# Patient Record
Sex: Female | Born: 1995 | Race: White | Hispanic: No | Marital: Married
Health system: Southern US, Community
[De-identification: ages and names within clinical notes are randomized; demographics above are authoritative.]

## PROBLEM LIST (undated history)

## (undated) ENCOUNTER — Inpatient Hospital Stay (HOSPITAL_COMMUNITY): Payer: Self-pay

## (undated) DIAGNOSIS — I493 Ventricular premature depolarization: Secondary | ICD-10-CM

## (undated) DIAGNOSIS — Z9682 Presence of neurostimulator: Secondary | ICD-10-CM

## (undated) DIAGNOSIS — Z227 Latent tuberculosis: Secondary | ICD-10-CM

## (undated) DIAGNOSIS — Z9689 Presence of other specified functional implants: Secondary | ICD-10-CM

## (undated) DIAGNOSIS — R569 Unspecified convulsions: Secondary | ICD-10-CM

## (undated) DIAGNOSIS — N83209 Unspecified ovarian cyst, unspecified side: Secondary | ICD-10-CM

## (undated) DIAGNOSIS — G40909 Epilepsy, unspecified, not intractable, without status epilepticus: Secondary | ICD-10-CM

## (undated) DIAGNOSIS — K219 Gastro-esophageal reflux disease without esophagitis: Secondary | ICD-10-CM

## (undated) DIAGNOSIS — R002 Palpitations: Secondary | ICD-10-CM

## (undated) DIAGNOSIS — I10 Essential (primary) hypertension: Secondary | ICD-10-CM

## (undated) DIAGNOSIS — Z87442 Personal history of urinary calculi: Secondary | ICD-10-CM

## (undated) DIAGNOSIS — N200 Calculus of kidney: Secondary | ICD-10-CM

## (undated) DIAGNOSIS — G43909 Migraine, unspecified, not intractable, without status migrainosus: Secondary | ICD-10-CM

## (undated) DIAGNOSIS — N809 Endometriosis, unspecified: Secondary | ICD-10-CM

## (undated) DIAGNOSIS — E041 Nontoxic single thyroid nodule: Secondary | ICD-10-CM

## (undated) DIAGNOSIS — B009 Herpesviral infection, unspecified: Secondary | ICD-10-CM

## (undated) DIAGNOSIS — F419 Anxiety disorder, unspecified: Secondary | ICD-10-CM

## (undated) DIAGNOSIS — F32A Depression, unspecified: Secondary | ICD-10-CM

## (undated) HISTORY — DX: Palpitations: R00.2

## (undated) HISTORY — DX: Migraine, unspecified, not intractable, without status migrainosus: G43.909

## (undated) HISTORY — PX: LAPAROSCOPY: SHX197

## (undated) HISTORY — DX: Anxiety disorder, unspecified: F41.9

## (undated) HISTORY — PX: TONSILLECTOMY: SUR1361

## (undated) HISTORY — PX: THYROIDECTOMY, PARTIAL: SHX18

## (undated) HISTORY — DX: Presence of neurostimulator: Z96.82

## (undated) HISTORY — PX: VAGUS NERVE STIMULATOR INSERTION: SHX348

---
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
## 2013-03-05 ENCOUNTER — Encounter (HOSPITAL_COMMUNITY): Payer: Self-pay | Admitting: Emergency Medicine

## 2013-03-05 ENCOUNTER — Emergency Department (HOSPITAL_COMMUNITY)
Admission: EM | Admit: 2013-03-05 | Discharge: 2013-03-06 | Disposition: A | Discharge status: Home / Self Care | Payer: Medicaid Other | Attending: Emergency Medicine | Admitting: Emergency Medicine

## 2013-03-05 DIAGNOSIS — R1031 Right lower quadrant pain: Secondary | ICD-10-CM | POA: Insufficient documentation

## 2013-03-05 DIAGNOSIS — O9989 Other specified diseases and conditions complicating pregnancy, childbirth and the puerperium: Secondary | ICD-10-CM | POA: Insufficient documentation

## 2013-03-05 DIAGNOSIS — O26899 Other specified pregnancy related conditions, unspecified trimester: Secondary | ICD-10-CM

## 2013-03-05 DIAGNOSIS — Z79899 Other long term (current) drug therapy: Secondary | ICD-10-CM | POA: Insufficient documentation

## 2013-03-05 LAB — CBC WITH DIFFERENTIAL/PLATELET
Basophils Absolute: 0 10*3/uL (ref 0.0–0.1)
Basophils Relative: 0 % (ref 0–1)
Eosinophils Absolute: 0.1 10*3/uL (ref 0.0–0.7)
Eosinophils Relative: 1 % (ref 0–5)
HCT: 39 % (ref 36.0–46.0)
Hemoglobin: 14 g/dL (ref 12.0–15.0)
Lymphocytes Relative: 26 % (ref 12–46)
Lymphs Abs: 2.4 10*3/uL (ref 0.7–4.0)
MCH: 34.1 pg — ABNORMAL HIGH (ref 26.0–34.0)
MCHC: 35.9 g/dL (ref 30.0–36.0)
MCV: 94.9 fL (ref 78.0–100.0)
Monocytes Absolute: 0.7 10*3/uL (ref 0.1–1.0)
Monocytes Relative: 8 % (ref 3–12)
Neutro Abs: 5.8 10*3/uL (ref 1.7–7.7)
Neutrophils Relative %: 64 % (ref 43–77)
Platelets: 251 10*3/uL (ref 150–400)
RBC: 4.11 MIL/uL (ref 3.87–5.11)
RDW: 12.4 % (ref 11.5–15.5)
WBC: 9 10*3/uL (ref 4.0–10.5)

## 2013-03-05 LAB — URINALYSIS, ROUTINE W REFLEX MICROSCOPIC
Bilirubin Urine: NEGATIVE
Glucose, UA: 500 mg/dL — AB
Hgb urine dipstick: NEGATIVE
Ketones, ur: NEGATIVE mg/dL
Nitrite: NEGATIVE
Protein, ur: NEGATIVE mg/dL
Specific Gravity, Urine: 1.023 (ref 1.005–1.030)
Urobilinogen, UA: 0.2 mg/dL (ref 0.0–1.0)
pH: 5.5 (ref 5.0–8.0)

## 2013-03-05 LAB — URINE MICROSCOPIC-ADD ON

## 2013-03-05 LAB — COMPREHENSIVE METABOLIC PANEL
ALT: 21 U/L (ref 0–35)
AST: 20 U/L (ref 0–37)
Albumin: 4.4 g/dL (ref 3.5–5.2)
Alkaline Phosphatase: 73 U/L (ref 39–117)
BUN: 15 mg/dL (ref 6–23)
CO2: 27 mEq/L (ref 19–32)
Calcium: 9.5 mg/dL (ref 8.4–10.5)
Chloride: 105 mEq/L (ref 96–112)
Creatinine, Ser: 0.59 mg/dL (ref 0.50–1.10)
GFR calc Af Amer: >90 mL/min (ref >90)
GFR calc non Af Amer: >90 mL/min (ref >90)
Glucose, Bld: 82 mg/dL (ref 70–99)
Potassium: 3.6 mEq/L (ref 3.5–5.1)
Sodium: 142 mEq/L (ref 135–145)
Total Bilirubin: 0.3 mg/dL (ref 0.3–1.2)
Total Protein: 7 g/dL (ref 6.0–8.3)

## 2013-03-05 LAB — POCT PREGNANCY, URINE: Preg Test, Ur: POSITIVE — AB

## 2013-03-05 NOTE — ED Notes (Signed)
Patient is sitting in the waiting room eating fast food without any difficulty.

## 2013-03-05 NOTE — ED Notes (Signed)
Pt. reports right abdominal pain onset yesterday , denies nausea or vomitting /diarrhea.  No vaginal bleeding . Pt. stated she is 2 months pregnant ( G1P0 ).

## 2013-03-06 ENCOUNTER — Emergency Department (HOSPITAL_COMMUNITY): Payer: Medicaid Other

## 2013-03-06 LAB — WET PREP, GENITAL
Trich, Wet Prep: NONE SEEN
Yeast Wet Prep HPF POC: NONE SEEN

## 2013-03-06 LAB — HCG, QUANTITATIVE, PREGNANCY: hCG, Beta Chain, Quant, S: 4736 m[IU]/mL — ABNORMAL HIGH (ref <5)

## 2013-03-06 LAB — ABO/RH: ABO/RH(D): O POS

## 2013-03-06 NOTE — ED Notes (Signed)
Pt states that she is [redacted] weeks pregnant and has been having really bad pains on her side which started yesterday.

## 2013-03-06 NOTE — ED Provider Notes (Signed)
CSN: [id number]     Arrival date & time 03/05/13  1954 History   First MD Initiated Contact with Patient 03/06/13 0004     Chief Complaint  Patient presents with  . Abdominal Pain   (Consider location/radiation/quality/duration/timing/severity/associated sxs/prior Treatment) HPI This is a 17 year old female G1 P0 who is about [redacted] weeks pregnant. She's had a two-day history of right lower quadrant pain. She describes the pain as intermittent. It is sharp and crampy components. There does not seem to be any specific trigger for this pain. It is somewhat worse with movement or palpation. It is not affected by eating. It is mild at the present moment but has been moderate to severe at times. There's been no associated vaginal bleeding but she has had a vaginal discharge that has not acutely changed. She denies fever, chills, vomiting, diarrhea or urinary symptoms. She is in a [employer] [place] due to a recent ankle fracture.  History reviewed. No pertinent past medical history. History reviewed. No pertinent past surgical history. No family history on file. History  Substance Use Topics  . Smoking status: Never Smoker   . Smokeless tobacco: Not on file  . Alcohol Use: Yes   OB History   Grav Para Term Preterm Abortions TAB SAB Ect Mult Living   1              Review of Systems  All other systems reviewed and are negative.    Allergies  Review of patient's allergies indicates no known allergies.  Home Medications   Current Outpatient Rx  Name  Route  Sig  Dispense  Refill  . acetaminophen (TYLENOL) 325 MG tablet   Oral   Take 650 mg by mouth every 6 (six) hours as needed for pain.         . Prenatal Multivit-Min-Fe-FA (PRENATAL FORTE PO)   Oral   Take 1 tablet by mouth daily.         . valACYclovir (VALTREX) 500 MG tablet   Oral   Take 500 mg by mouth daily.          BP 106/52  Pulse 86  Temp(Src) 98.2 F (36.8 C) (Oral)  Resp 18  SpO2 98%  LMP 01/30/2013  Physical  Exam General: Well-developed, well-nourished female in no acute distress; appearance consistent with age of record HENT: normocephalic; atraumatic Eyes: pupils equal, round and reactive to light; extraocular muscles intact Neck: supple Heart: regular rate and rhythm; no murmurs, rubs or gallops Lungs: clear to auscultation bilaterally Abdomen: soft; nondistended; right suprapubic tenderness; no masses or hepatosplenomegaly; bowel sounds present GU: Normal external genitalia; no vaginal bleeding; white vaginal discharge; no cervical motion tenderness; cervical os closed; right adnexal tenderness Extremities: No deformity; full range of motion; pulses normal Neurologic: Awake, alert and oriented; motor function intact in all extremities and symmetric; no facial droop Skin: Warm and dry Psychiatric: Normal mood and affect    ED Course  Procedures (including critical care time)  MDM   Nursing notes and vitals signs, including pulse oximetry, reviewed.  Summary of this visit's results, reviewed by myself:  Labs:  Results for orders placed during the hospital encounter of 03/05/13 (from the past 24 hour(s))  CBC WITH DIFFERENTIAL     Status: Abnormal   Collection Time    03/05/13  8:02 PM      Result Value Range   WBC 9.0  4.0 - 10.5 K/uL   RBC 4.11  3.87 - 5.11 MIL/uL   Hemoglobin  14.0  12.0 - 15.0 g/dL   HCT 40.9  81.1 - 91.4 %   MCV 94.9  78.0 - 100.0 fL   MCH 34.1 (*) 26.0 - 34.0 pg   MCHC 35.9  30.0 - 36.0 g/dL   RDW 78.2  95.6 - 21.3 %   Platelets 251  150 - 400 K/uL   Neutrophils Relative % 64  43 - 77 %   Neutro Abs 5.8  1.7 - 7.7 K/uL   Lymphocytes Relative 26  12 - 46 %   Lymphs Abs 2.4  0.7 - 4.0 K/uL   Monocytes Relative 8  3 - 12 %   Monocytes Absolute 0.7  0.1 - 1.0 K/uL   Eosinophils Relative 1  0 - 5 %   Eosinophils Absolute 0.1  0.0 - 0.7 K/uL   Basophils Relative 0  0 - 1 %   Basophils Absolute 0.0  0.0 - 0.1 K/uL  COMPREHENSIVE METABOLIC PANEL      Status: None   Collection Time    03/05/13  8:02 PM      Result Value Range   Sodium 142  135 - 145 mEq/L   Potassium 3.6  3.5 - 5.1 mEq/L   Chloride 105  96 - 112 mEq/L   CO2 27  19 - 32 mEq/L   Glucose, Bld 82  70 - 99 mg/dL   BUN 15  6 - 23 mg/dL   Creatinine, Ser 0.86  0.50 - 1.10 mg/dL   Calcium 9.5  8.4 - 57.8 mg/dL   Total Protein 7.0  6.0 - 8.3 g/dL   Albumin 4.4  3.5 - 5.2 g/dL   AST 20  0 - 37 U/L   ALT 21  0 - 35 U/L   Alkaline Phosphatase 73  39 - 117 U/L   Total Bilirubin 0.3  0.3 - 1.2 mg/dL   GFR calc non Af Amer >90  >90 mL/min   GFR calc Af Amer >90  >90 mL/min  URINALYSIS, ROUTINE W REFLEX MICROSCOPIC     Status: Abnormal   Collection Time    03/05/13  8:14 PM      Result Value Range   Color, Urine YELLOW  YELLOW   APPearance CLOUDY (*) CLEAR   Specific Gravity, Urine 1.023  1.005 - 1.030   pH 5.5  5.0 - 8.0   Glucose, UA 500 (*) NEGATIVE mg/dL   Hgb urine dipstick NEGATIVE  NEGATIVE   Bilirubin Urine NEGATIVE  NEGATIVE   Ketones, ur NEGATIVE  NEGATIVE mg/dL   Protein, ur NEGATIVE  NEGATIVE mg/dL   Urobilinogen, UA 0.2  0.0 - 1.0 mg/dL   Nitrite NEGATIVE  NEGATIVE   Leukocytes, UA TRACE (*) NEGATIVE  URINE MICROSCOPIC-ADD ON     Status: Abnormal   Collection Time    03/05/13  8:14 PM      Result Value Range   Squamous Epithelial / LPF FEW (*) RARE   WBC, UA 0-2  <3 WBC/hpf   Bacteria, UA RARE  RARE   Urine-Other MUCOUS PRESENT    POCT PREGNANCY, URINE     Status: Abnormal   Collection Time    03/05/13  8:21 PM      Result Value Range   Preg Test, Ur POSITIVE (*) NEGATIVE  HCG, QUANTITATIVE, PREGNANCY     Status: Abnormal   Collection Time    03/06/13 12:15 AM      Result Value Range   hCG, Beta Chain, Quant, S 4736 (*) <  5 mIU/mL  ABO/RH     Status: None   Collection Time    2013/03/13 12:15 AM      Result Value Range   ABO/RH(D) O POS     No rh immune globuloin NOT A RH IMMUNE GLOBULIN CANDIDATE, PT RH POSITIVE    WET PREP, GENITAL      Status: Abnormal   Collection Time    03/13/2013 12:42 AM      Result Value Range   Yeast Wet Prep HPF POC NONE SEEN  NONE SEEN   Trich, Wet Prep NONE SEEN  NONE SEEN   Clue Cells Wet Prep HPF POC MODERATE (*) NONE SEEN   WBC, Wet Prep HPF POC FEW (*) NONE SEEN    Imaging Studies: US Ob Comp Less 14 Wks  March 13, 2013   CLINICAL DATA:  Pelvic pain.  EXAM: OBSTETRIC <14 WK [place] AND TRANSVAGINAL OB US  TECHNIQUE: Both transabdominal and transvaginal ultrasound examinations were performed for complete evaluation of the gestation as well as the maternal uterus, adnexal regions, and pelvic cul-de-sac. Transvaginal technique was performed to assess early pregnancy.  COMPARISON:  None.  FINDINGS: Intrauterine gestational sac: Visualized/normal in shape.  Yolk sac:  Present  Embryo:  Not visualized  MSD:  7.2  mm   5 w   3  d  [place] EDC:   11/03/2013  Maternal uterus/adnexae: No subchorionic hemorrhage. Ovaries symmetric in size and echotexture. No adnexal masses. No free fluid.  IMPRESSION: Early intrauterine gestational sac, 5 weeks 3 days. No fetal pole currently. No acute maternal findings.   Electronically Signed   By: Charlett Nose M.D.   On: 13-Mar-2013 02:53   US Ob Transvaginal  03-13-2013   CLINICAL DATA:  Pelvic pain.  EXAM: OBSTETRIC <14 WK [place] AND TRANSVAGINAL OB US  TECHNIQUE: Both transabdominal and transvaginal ultrasound examinations were performed for complete evaluation of the gestation as well as the maternal uterus, adnexal regions, and pelvic cul-de-sac. Transvaginal technique was performed to assess early pregnancy.  COMPARISON:  None.  FINDINGS: Intrauterine gestational sac: Visualized/normal in shape.  Yolk sac:  Present  Embryo:  Not visualized  MSD:  7.2  mm   5 w   3  d  [place] EDC:   11/03/2013  Maternal uterus/adnexae: No subchorionic hemorrhage. Ovaries symmetric in size and echotexture. No adnexal masses. No free fluid.  IMPRESSION: Early intrauterine gestational sac, 5 weeks 3 days. No fetal  pole currently. No acute maternal findings.   Electronically Signed   By: Charlett Nose M.D.   On: March 13, 2013 02:53   3:13 AM Patient states pain is improved significantly. The analgesics were given in the ED. Her abdomen is soft without tenderness at this time. She was advised of lab and ultrasound findings. She was also advised that we cannot rule out early appendicitis and she should return should her pain worsen.  Hanley Seamen, MD 13-Mar-2013 [phone]

## 2013-03-07 LAB — GC/CHLAMYDIA PROBE AMP
CT Probe RNA: NEGATIVE
GC Probe RNA: NEGATIVE

## 2013-07-16 ENCOUNTER — Emergency Department (HOSPITAL_COMMUNITY)
Admission: EM | Admit: 2013-07-16 | Discharge: 2013-07-17 | Disposition: A | Discharge status: Home / Self Care | Payer: Medicaid Other | Attending: Emergency Medicine | Admitting: Emergency Medicine

## 2013-07-16 ENCOUNTER — Encounter (HOSPITAL_COMMUNITY): Payer: Self-pay | Admitting: Emergency Medicine

## 2013-07-16 DIAGNOSIS — O99891 Other specified diseases and conditions complicating pregnancy: Secondary | ICD-10-CM

## 2013-07-16 DIAGNOSIS — Z79899 Other long term (current) drug therapy: Secondary | ICD-10-CM | POA: Insufficient documentation

## 2013-07-16 DIAGNOSIS — M545 Low back pain, unspecified: Secondary | ICD-10-CM | POA: Insufficient documentation

## 2013-07-16 DIAGNOSIS — O26899 Other specified pregnancy related conditions, unspecified trimester: Secondary | ICD-10-CM

## 2013-07-16 DIAGNOSIS — M549 Dorsalgia, unspecified: Secondary | ICD-10-CM

## 2013-07-16 DIAGNOSIS — Z8679 Personal history of other diseases of the circulatory system: Secondary | ICD-10-CM | POA: Insufficient documentation

## 2013-07-16 DIAGNOSIS — O9989 Other specified diseases and conditions complicating pregnancy, childbirth and the puerperium: Secondary | ICD-10-CM | POA: Insufficient documentation

## 2013-07-16 DIAGNOSIS — R109 Unspecified abdominal pain: Secondary | ICD-10-CM | POA: Insufficient documentation

## 2013-07-16 HISTORY — DX: Ventricular premature depolarization: I49.3

## 2013-07-16 NOTE — ED Notes (Signed)
Pt c/o abd tightness with low back pain today. Pt is 24 weeks preg.

## 2013-07-17 ENCOUNTER — Encounter (HOSPITAL_COMMUNITY): Payer: Self-pay | Admitting: *Deleted

## 2013-07-17 MED ORDER — ACETAMINOPHEN 325 MG PO TABS
650.0000 mg | ORAL_TABLET | Freq: Once | ORAL | Status: AC
  Administered 2013-07-17: 650 mg via ORAL
  Filled 2013-07-17: qty 2

## 2013-07-17 NOTE — ED Provider Notes (Signed)
Medical screening examination/treatment/procedure(s) were performed by non-physician practitioner and as supervising physician I was immediately available for consultation/collaboration.   , MD 07/17/13 0632 

## 2013-07-17 NOTE — Progress Notes (Signed)
Called to see pt with complaints of back pain and tightening throughout the day. Pt is seen in [facility] with regular visits, has a regular appointment today at [facility]

## 2013-07-17 NOTE — ED Provider Notes (Signed)
CSN: [id number]     Arrival date & time 07/16/13  2238 History   First MD Initiated Contact with Patient 07/17/13 0002     Chief Complaint  Patient presents with  . Abdominal Cramping     (Consider location/radiation/quality/duration/timing/severity/associated sxs/prior Treatment) HPI Comments: Patient is a 18 year old female who is [redacted] weeks pregnant who presents to the emergency department complaining of abdominal tightness and low back pain x1 day. Patient states today while she was walking she developed a sharp low back pain radiating down both of her legs. Pain relieved by rest, worse with ambulation. Denies numbness or tingling. States earlier today she had tight abdominal cramping for a few minutes which subsided on its own. She has not had this symptom return, however states her abdomen feels "tight". She has an appointment with her OB GYN tomorrow for a general followup. Last had an ultrasound on January 15 which was normal. Denies any complications with this pregnancy. This is her first pregnancy. Denies injury, trauma, vaginal bleeding, chest pain or shortness of breath, nausea or vomiting.  Patient is a 18 y.o. female presenting with cramps. The history is provided by the patient.  Abdominal Cramping Associated symptoms include abdominal pain.    Past Medical History  Diagnosis Date  . PVC (premature ventricular contraction)    History reviewed. No pertinent past surgical history. No family history on file. History  Substance Use Topics  . Smoking status: Never Smoker   . Smokeless tobacco: Not on file  . Alcohol Use: No   OB History   Grav Para Term Preterm Abortions TAB SAB Ect Mult Living   1              Review of Systems  Gastrointestinal: Positive for abdominal pain.  Musculoskeletal: Positive for back pain.  All other systems reviewed and are negative.      Allergies  Review of patient's allergies indicates no known allergies.  Home Medications    Current Outpatient Rx  Name  Route  Sig  Dispense  Refill  . acetaminophen (TYLENOL) 325 MG tablet   Oral   Take 650 mg by mouth every 6 (six) hours as needed for pain.         . Prenatal Multivit-Min-Fe-FA (PRENATAL FORTE PO)   Oral   Take 1 tablet by mouth daily.         . valACYclovir (VALTREX) 500 MG tablet   Oral   Take 500 mg by mouth daily.          BP 142/77  Pulse 88  Temp(Src) 98.4 F (36.9 C) (Oral)  Resp 18  Wt 140 lb (63.504 kg)  SpO2 100%  LMP 01/30/2013 Physical Exam  Nursing note and vitals reviewed. Constitutional: She is oriented to person, place, and time. She appears well-developed and well-nourished. No distress.  HENT:  Head: Normocephalic and atraumatic.  Mouth/Throat: Oropharynx is clear and moist.  Eyes: Conjunctivae are normal.  Neck: Normal range of motion. Neck supple.  Cardiovascular: Normal rate, regular rhythm, normal heart sounds and intact distal pulses.   Pulmonary/Chest: Effort normal and breath sounds normal.  Abdominal: Soft. Bowel sounds are normal. There is no tenderness.  Gravid abdomen.  Musculoskeletal: Normal range of motion. She exhibits no edema.  Neurological: She is alert and oriented to person, place, and time. She has normal strength. No sensory deficit.  Skin: Skin is warm and dry. She is not diaphoretic.  Psychiatric: She has a normal mood and affect. Her behavior  is normal.    ED Course  Procedures (including critical care time) Labs Review Labs Reviewed - No data to display Imaging Review No results found.  EKG Interpretation   None       MDM   Final diagnoses:  Abdominal pain in pregnancy  Back pain in pregnancy    Patient presenting with abdominal pain and back pain in pregnancy. No trauma or injury. No associated vaginal bleeding. Patient was on monitor prior to my evaluation. Patient is not having any contractions. Fetal heart tones normal. Abdomen is nontender. No red flags concerning  patient's back pain. No s/s of central cord compression or cauda equina. Lower extremities are neurovascularly intact and patient is ambulating slowly but normal. Patient has a followup with OB/GYN tomorrow for her general scheduled visit. She is stable for discharge. Close return precautions discussed, patient aware to a women[facility] hospital if any worsening symptoms or vaginal bleeding. Return precautions given. Patient states understanding of treatment care plan and is agreeable.      Trevor MaceRobyn M Albert, PA-C 07/17/13 [phone]

## 2013-07-17 NOTE — ED Notes (Signed)
OB rapid response nurse at bedside. 

## 2013-07-17 NOTE — Discharge Instructions (Signed)
Take tylenol every 6 hours as needed for pain. Follow up with your OB/GYN as scheduled. Return to the emergency department [facility]) with worsening pain, vaginal bleeding or any trauma.  Abdominal Pain During Pregnancy Abdominal pain is common in pregnancy. Most of the time, it does not cause harm. There are many causes of abdominal pain. Some causes are more serious than others. Some of the causes of abdominal pain in pregnancy are easily diagnosed. Occasionally, the diagnosis takes time to understand. Other times, the cause is not determined. Abdominal pain can be a sign that something is very wrong with the pregnancy, or the pain may have nothing to do with the pregnancy at all. For this reason, always tell your health care provider if you have any abdominal discomfort. HOME CARE INSTRUCTIONS  Monitor your abdominal pain for any changes. The following actions may help to alleviate any discomfort you are experiencing:  Do not have sexual intercourse or put anything in your vagina until your symptoms go away completely.  Get plenty of rest until your pain improves.  Drink clear fluids if you feel nauseous. Avoid solid food as long as you are uncomfortable or nauseous.  Only take over-the-counter or prescription medicine as directed by your health care provider.  Keep all follow-up appointments with your health care provider. SEEK IMMEDIATE MEDICAL CARE IF:  You are bleeding, leaking fluid, or passing tissue from the vagina.  You have increasing pain or cramping.  You have persistent vomiting.  You have painful or bloody urination.  You have a fever.  You notice a decrease in your baby's movements.  You have extreme weakness or feel faint.  You have shortness of breath, with or without abdominal pain.  You develop a severe headache with abdominal pain.  You have abnormal vaginal discharge with abdominal pain.  You have persistent diarrhea.  You have abdominal pain  that continues even after rest, or gets worse. MAKE SURE YOU:   Understand these instructions.  Will watch your condition.  Will get help right away if you are not doing well or get worse. Document Released: 05/22/2005 Document Revised: 03/12/2013 Document Reviewed: 12/19/2012 [facility] Patient Information 2014 [place], [place].  Back Pain in Pregnancy Back pain during pregnancy is common. It happens in about half of all pregnancies. It is important for you and your baby that you remain active during your pregnancy.If you feel that back pain is not allowing you to remain active or sleep well, it is time to see your caregiver. Back pain may be caused by several factors related to changes during your pregnancy.Fortunately, unless you had trouble with your back before your pregnancy, the pain is likely to get better after you deliver. Low back pain usually occurs between the fifth and seventh months of pregnancy. It can, however, happen in the first couple months. Factors that increase the risk of back problems include:   Previous back problems.  Injury to your back.  Having twins or multiple births.  A chronic cough.  Stress.  Job-related repetitive motions.  Muscle or spinal disease in the back.  Family history of back problems, ruptured (herniated) discs, or osteoporosis.  Depression, anxiety, and panic attacks. CAUSES   When you are pregnant, your body produces a hormone called relaxin. This hormonemakes the ligaments connecting the low back and pubic bones more flexible. This flexibility allows the baby to be delivered more easily. When your ligaments are loose, your muscles need to work harder to support your back. Soreness  in your back can come from tired muscles. Soreness can also come from back tissues that are irritated since they are receiving less support.  As the baby grows, it puts pressure on the nerves and blood vessels in your pelvis. This can cause back pain.  As  the baby grows and gets heavier during pregnancy, the uterus pushes the stomach muscles forward and changes your center of gravity. This makes your back muscles work harder to maintain good posture. SYMPTOMS  Lumbar pain during pregnancy Lumbar pain during pregnancy usually occurs at or above the waist in the center of the back. There may be pain and numbness that radiates into your leg or foot. This is similar to low back pain experienced by non-pregnant women. It usually increases with sitting for long periods of time, standing, or repetitive lifting. Tenderness may also be present in the muscles along your upper back. Posterior pelvic pain during pregnancy Pain in the back of the pelvis is more common than lumbar pain in pregnancy. It is a deep pain felt in your side at the waistline, or across the tailbone (sacrum), or in both places. You may have pain on one or both sides. This pain can also go into the buttocks and backs of the upper thighs. Pubic and groin pain may also be present. The pain does not quickly resolve with rest, and morning stiffness may also be present. Pelvic pain during pregnancy can be brought on by most activities. A high level of fitness before and during pregnancy may or may not prevent this problem. Labor pain is usually 1 to 2 minutes apart, lasts for about 1 minute, and involves a bearing down feeling or pressure in your pelvis. However, if you are at term with the pregnancy, constant low back pain can be the beginning of early labor, and you should be aware of this. DIAGNOSIS  X-rays of the back should not be done during the first 12 to 14 weeks of the pregnancy and only when absolutely necessary during the rest of the pregnancy. MRIs do not give off radiation and are safe during pregnancy. MRIs also should only be done when absolutely necessary. HOME CARE INSTRUCTIONS  Exercise as directed by your caregiver. Exercise is the most effective way to prevent or manage back  pain. If you have a back problem, it is especially important to avoid sports that require sudden body movements. Swimming and walking are great activities.  Do not stand in one place for long periods of time.  Do not wear high heels.  Sit in chairs with good posture. Use a pillow on your lower back if necessary. Make sure your head rests over your shoulders and is not hanging forward.  Try sleeping on your side, preferably the left side, with a pillow or two between your legs. If you are sore after a night's rest, your bedmay betoo soft.Try placing a board between your mattress and box spring.  Listen to your body when lifting.If you are experiencing pain, ask for help or try bending yourknees more so you can use your leg muscles rather than your back muscles. Squat down when picking up something from the floor. Do not bend over.  Eat a healthy diet. Try to gain weight within your caregiver's recommendations.  Use heat or cold packs 3 to 4 times a day for 15 minutes to help with the pain.  Only take over-the-counter or prescription medicines for pain, discomfort, or fever as directed by your caregiver. Sudden (acute)  back pain  Use bed rest for only the most extreme, acute episodes of back pain. Prolonged bed rest over 48 hours will aggravate your condition.  Ice is very effective for acute conditions.  Put ice in a plastic bag.  Place a towel between your skin and the bag.  Leave the ice on for 10 to 20 minutes every 2 hours, or as needed.  Using heat packs for 30 minutes prior to activities is also helpful. Continued back pain See your caregiver if you have continued problems. Your caregiver can help or refer you for appropriate physical therapy. With conditioning, most back problems can be avoided. Sometimes, a more serious issue may be the cause of back pain. You should be seen right away if new problems seem to be developing. Your caregiver may recommend:  A maternity  girdle.  An elastic sling.  A back brace.  A massage therapist or acupuncture. SEEK MEDICAL CARE IF:   You are not able to do most of your daily activities, even when taking the pain medicine you were given.  You need a referral to a physical therapist or chiropractor.  You want to try acupuncture. SEEK IMMEDIATE MEDICAL CARE IF:  You develop numbness, tingling, weakness, or problems with the use of your arms or legs.  You develop severe back pain that is no longer relieved with medicines.  You have a sudden change in bowel or bladder control.  You have increasing pain in other areas of the body.  You develop shortness of breath, dizziness, or fainting.  You develop nausea, vomiting, or sweating.  You have back pain which is similar to labor pains.  You have back pain along with your water breaking or vaginal bleeding.  You have back pain or numbness that travels down your leg.  Your back pain developed after you fell.  You develop pain on one side of your back. You may have a kidney stone.  You see blood in your urine. You may have a bladder infection or kidney stone.  You have back pain with blisters. You may have shingles. Back pain is fairly common during pregnancy but should not be accepted as just part of the process. Back pain should always be treated as soon as possible. This will make your pregnancy as pleasant as possible. Document Released: 08/30/2005 Document Revised: 08/14/2011 Document Reviewed: 10/11/2010 [facility] Patient Information 2014 [place], [place].

## 2013-10-17 DIAGNOSIS — O1490 Unspecified pre-eclampsia, unspecified trimester: Secondary | ICD-10-CM

## 2014-04-06 ENCOUNTER — Encounter (HOSPITAL_COMMUNITY): Payer: Self-pay | Admitting: *Deleted

## 2014-05-22 ENCOUNTER — Encounter (HOSPITAL_COMMUNITY): Payer: Self-pay | Admitting: *Deleted

## 2014-12-11 DIAGNOSIS — G43009 Migraine without aura, not intractable, without status migrainosus: Secondary | ICD-10-CM | POA: Insufficient documentation

## 2014-12-11 DIAGNOSIS — G43709 Chronic migraine without aura, not intractable, without status migrainosus: Secondary | ICD-10-CM | POA: Insufficient documentation

## 2014-12-11 HISTORY — DX: Migraine without aura, not intractable, without status migrainosus: G43.009

## 2016-04-25 DIAGNOSIS — R002 Palpitations: Secondary | ICD-10-CM | POA: Insufficient documentation

## 2016-04-25 DIAGNOSIS — R0789 Other chest pain: Secondary | ICD-10-CM

## 2016-04-25 DIAGNOSIS — R0602 Shortness of breath: Secondary | ICD-10-CM | POA: Insufficient documentation

## 2016-04-25 HISTORY — DX: Other chest pain: R07.89

## 2016-04-25 HISTORY — DX: Shortness of breath: R06.02

## 2017-03-14 DIAGNOSIS — N809 Endometriosis, unspecified: Secondary | ICD-10-CM | POA: Insufficient documentation

## 2017-07-11 DIAGNOSIS — F4310 Post-traumatic stress disorder, unspecified: Secondary | ICD-10-CM | POA: Diagnosis not present

## 2017-07-11 DIAGNOSIS — Z7689 Persons encountering health services in other specified circumstances: Secondary | ICD-10-CM | POA: Diagnosis not present

## 2017-07-11 DIAGNOSIS — N3001 Acute cystitis with hematuria: Secondary | ICD-10-CM | POA: Diagnosis not present

## 2017-07-11 DIAGNOSIS — G43909 Migraine, unspecified, not intractable, without status migrainosus: Secondary | ICD-10-CM | POA: Diagnosis not present

## 2017-07-31 DIAGNOSIS — R6889 Other general symptoms and signs: Secondary | ICD-10-CM | POA: Diagnosis not present

## 2017-07-31 DIAGNOSIS — Z6826 Body mass index (BMI) 26.0-26.9, adult: Secondary | ICD-10-CM | POA: Diagnosis not present

## 2017-07-31 DIAGNOSIS — J019 Acute sinusitis, unspecified: Secondary | ICD-10-CM | POA: Diagnosis not present

## 2017-08-07 DIAGNOSIS — Z309 Encounter for contraceptive management, unspecified: Secondary | ICD-10-CM | POA: Diagnosis not present

## 2017-08-07 DIAGNOSIS — Z01419 Encounter for gynecological examination (general) (routine) without abnormal findings: Secondary | ICD-10-CM | POA: Diagnosis not present

## 2017-08-07 DIAGNOSIS — Z6826 Body mass index (BMI) 26.0-26.9, adult: Secondary | ICD-10-CM | POA: Diagnosis not present

## 2017-08-07 DIAGNOSIS — Z13220 Encounter for screening for lipoid disorders: Secondary | ICD-10-CM | POA: Diagnosis not present

## 2017-08-07 DIAGNOSIS — F4310 Post-traumatic stress disorder, unspecified: Secondary | ICD-10-CM | POA: Diagnosis not present

## 2017-08-08 DIAGNOSIS — Z202 Contact with and (suspected) exposure to infections with a predominantly sexual mode of transmission: Secondary | ICD-10-CM | POA: Diagnosis not present

## 2017-11-15 DIAGNOSIS — N809 Endometriosis, unspecified: Secondary | ICD-10-CM | POA: Diagnosis not present

## 2017-11-15 DIAGNOSIS — R102 Pelvic and perineal pain: Secondary | ICD-10-CM | POA: Diagnosis not present

## 2018-01-01 DIAGNOSIS — N809 Endometriosis, unspecified: Secondary | ICD-10-CM | POA: Diagnosis not present

## 2018-01-01 DIAGNOSIS — N941 Unspecified dyspareunia: Secondary | ICD-10-CM | POA: Diagnosis not present

## 2018-01-01 DIAGNOSIS — R102 Pelvic and perineal pain: Secondary | ICD-10-CM | POA: Diagnosis not present

## 2018-01-15 DIAGNOSIS — N803 Endometriosis of pelvic peritoneum: Secondary | ICD-10-CM | POA: Diagnosis not present

## 2018-01-15 DIAGNOSIS — N941 Unspecified dyspareunia: Secondary | ICD-10-CM | POA: Diagnosis not present

## 2018-01-15 DIAGNOSIS — R102 Pelvic and perineal pain: Secondary | ICD-10-CM | POA: Diagnosis not present

## 2018-01-30 DIAGNOSIS — F3181 Bipolar II disorder: Secondary | ICD-10-CM | POA: Diagnosis not present

## 2018-01-30 DIAGNOSIS — Z79891 Long term (current) use of opiate analgesic: Secondary | ICD-10-CM | POA: Diagnosis not present

## 2018-03-11 DIAGNOSIS — Z363 Encounter for antenatal screening for malformations: Secondary | ICD-10-CM | POA: Diagnosis not present

## 2018-03-11 DIAGNOSIS — Z01419 Encounter for gynecological examination (general) (routine) without abnormal findings: Secondary | ICD-10-CM | POA: Diagnosis not present

## 2018-03-11 DIAGNOSIS — B009 Herpesviral infection, unspecified: Secondary | ICD-10-CM | POA: Insufficient documentation

## 2018-03-11 DIAGNOSIS — Z3682 Encounter for antenatal screening for nuchal translucency: Secondary | ICD-10-CM | POA: Diagnosis not present

## 2018-03-11 DIAGNOSIS — Z3A01 Less than 8 weeks gestation of pregnancy: Secondary | ICD-10-CM | POA: Diagnosis not present

## 2018-03-11 DIAGNOSIS — Z113 Encounter for screening for infections with a predominantly sexual mode of transmission: Secondary | ICD-10-CM | POA: Diagnosis not present

## 2018-03-11 DIAGNOSIS — Z23 Encounter for immunization: Secondary | ICD-10-CM | POA: Diagnosis not present

## 2018-03-11 DIAGNOSIS — I493 Ventricular premature depolarization: Secondary | ICD-10-CM

## 2018-03-11 DIAGNOSIS — Z124 Encounter for screening for malignant neoplasm of cervix: Secondary | ICD-10-CM | POA: Diagnosis not present

## 2018-03-11 DIAGNOSIS — Z3689 Encounter for other specified antenatal screening: Secondary | ICD-10-CM | POA: Diagnosis not present

## 2018-03-11 DIAGNOSIS — O3680X0 Pregnancy with inconclusive fetal viability, not applicable or unspecified: Secondary | ICD-10-CM | POA: Diagnosis not present

## 2018-03-11 HISTORY — DX: Ventricular premature depolarization: I49.3

## 2018-06-05 HISTORY — PX: LOOP RECORDER INSERTION: EP1214

## 2018-06-06 DIAGNOSIS — N898 Other specified noninflammatory disorders of vagina: Secondary | ICD-10-CM

## 2018-06-06 DIAGNOSIS — O039 Complete or unspecified spontaneous abortion without complication: Secondary | ICD-10-CM | POA: Diagnosis not present

## 2018-06-06 DIAGNOSIS — Z3A00 Weeks of gestation of pregnancy not specified: Secondary | ICD-10-CM | POA: Diagnosis not present

## 2018-06-06 DIAGNOSIS — N9419 Other specified dyspareunia: Secondary | ICD-10-CM | POA: Diagnosis not present

## 2018-06-06 DIAGNOSIS — N809 Endometriosis, unspecified: Secondary | ICD-10-CM | POA: Diagnosis not present

## 2018-06-06 HISTORY — DX: Other specified noninflammatory disorders of vagina: N89.8

## 2018-06-09 DIAGNOSIS — N939 Abnormal uterine and vaginal bleeding, unspecified: Secondary | ICD-10-CM | POA: Diagnosis not present

## 2018-06-09 DIAGNOSIS — Z3202 Encounter for pregnancy test, result negative: Secondary | ICD-10-CM | POA: Diagnosis not present

## 2018-06-13 DIAGNOSIS — N9419 Other specified dyspareunia: Secondary | ICD-10-CM | POA: Diagnosis not present

## 2018-06-13 DIAGNOSIS — N921 Excessive and frequent menstruation with irregular cycle: Secondary | ICD-10-CM

## 2018-06-13 DIAGNOSIS — N809 Endometriosis, unspecified: Secondary | ICD-10-CM | POA: Diagnosis not present

## 2018-06-13 DIAGNOSIS — N898 Other specified noninflammatory disorders of vagina: Secondary | ICD-10-CM | POA: Diagnosis not present

## 2018-06-13 HISTORY — DX: Excessive and frequent menstruation with irregular cycle: N92.1

## 2018-07-25 DIAGNOSIS — N898 Other specified noninflammatory disorders of vagina: Secondary | ICD-10-CM | POA: Diagnosis not present

## 2018-07-25 DIAGNOSIS — N809 Endometriosis, unspecified: Secondary | ICD-10-CM | POA: Diagnosis not present

## 2018-07-25 DIAGNOSIS — Z3044 Encounter for surveillance of vaginal ring hormonal contraceptive device: Secondary | ICD-10-CM | POA: Diagnosis not present

## 2018-07-25 DIAGNOSIS — N921 Excessive and frequent menstruation with irregular cycle: Secondary | ICD-10-CM | POA: Diagnosis not present

## 2018-08-07 ENCOUNTER — Other Ambulatory Visit: Payer: Self-pay

## 2018-08-07 ENCOUNTER — Encounter (HOSPITAL_COMMUNITY): Payer: Self-pay | Admitting: Emergency Medicine

## 2018-08-07 ENCOUNTER — Emergency Department (HOSPITAL_COMMUNITY)
Admission: EM | Admit: 2018-08-07 | Discharge: 2018-08-07 | Disposition: A | Discharge status: Home / Self Care | Payer: BLUE CROSS/BLUE SHIELD | Attending: Emergency Medicine | Admitting: Emergency Medicine

## 2018-08-07 DIAGNOSIS — R42 Dizziness and giddiness: Secondary | ICD-10-CM

## 2018-08-07 DIAGNOSIS — R002 Palpitations: Secondary | ICD-10-CM | POA: Diagnosis not present

## 2018-08-07 DIAGNOSIS — E162 Hypoglycemia, unspecified: Secondary | ICD-10-CM | POA: Diagnosis not present

## 2018-08-07 DIAGNOSIS — E860 Dehydration: Secondary | ICD-10-CM

## 2018-08-07 DIAGNOSIS — E161 Other hypoglycemia: Secondary | ICD-10-CM | POA: Diagnosis not present

## 2018-08-07 DIAGNOSIS — R55 Syncope and collapse: Secondary | ICD-10-CM | POA: Diagnosis not present

## 2018-08-07 DIAGNOSIS — I491 Atrial premature depolarization: Secondary | ICD-10-CM | POA: Diagnosis not present

## 2018-08-07 HISTORY — DX: Ventricular premature depolarization: I49.3

## 2018-08-07 LAB — CBC WITH DIFFERENTIAL/PLATELET
Abs Immature Granulocytes: 0.01 10*3/uL (ref 0.00–0.07)
Basophils Absolute: 0 10*3/uL (ref 0.0–0.1)
Basophils Relative: 0 %
Eosinophils Absolute: 0 10*3/uL (ref 0.0–0.5)
Eosinophils Relative: 1 %
HCT: 44.6 % (ref 36.0–46.0)
Hemoglobin: 14.4 g/dL (ref 12.0–15.0)
Immature Granulocytes: 0 %
Lymphocytes Relative: 25 %
Lymphs Abs: 1.6 10*3/uL (ref 0.7–4.0)
MCH: 32.1 pg (ref 26.0–34.0)
MCHC: 32.3 g/dL (ref 30.0–36.0)
MCV: 99.3 fL (ref 80.0–100.0)
Monocytes Absolute: 0.5 10*3/uL (ref 0.1–1.0)
Monocytes Relative: 7 %
Neutro Abs: 4.1 10*3/uL (ref 1.7–7.7)
Neutrophils Relative %: 67 %
Platelets: 248 10*3/uL (ref 150–400)
RBC: 4.49 MIL/uL (ref 3.87–5.11)
RDW: 11.7 % (ref 11.5–15.5)
WBC: 6.1 10*3/uL (ref 4.0–10.5)
nRBC: 0 % (ref 0.0–0.2)

## 2018-08-07 LAB — URINALYSIS, ROUTINE W REFLEX MICROSCOPIC
Bilirubin Urine: NEGATIVE
Glucose, UA: NEGATIVE mg/dL
Hgb urine dipstick: NEGATIVE
Ketones, ur: 20 mg/dL — AB
Nitrite: NEGATIVE
Protein, ur: NEGATIVE mg/dL
Specific Gravity, Urine: 1.014 (ref 1.005–1.030)
pH: 8 (ref 5.0–8.0)

## 2018-08-07 LAB — LIPASE, BLOOD: Lipase: 23 U/L (ref 11–51)

## 2018-08-07 LAB — POC URINE PREG, ED: Preg Test, Ur: NEGATIVE

## 2018-08-07 LAB — COMPREHENSIVE METABOLIC PANEL
ALT: 21 U/L (ref 0–44)
AST: 20 U/L (ref 15–41)
Albumin: 4.1 g/dL (ref 3.5–5.0)
Alkaline Phosphatase: 54 U/L (ref 38–126)
Anion gap: 9 (ref 5–15)
BUN: 9 mg/dL (ref 6–20)
CO2: 19 mmol/L — ABNORMAL LOW (ref 22–32)
Calcium: 9.7 mg/dL (ref 8.9–10.3)
Chloride: 109 mmol/L (ref 98–111)
Creatinine, Ser: 0.82 mg/dL (ref 0.44–1.00)
GFR calc Af Amer: >60 mL/min (ref >60)
GFR calc non Af Amer: >60 mL/min (ref >60)
Glucose, Bld: 84 mg/dL (ref 70–99)
Potassium: 4 mmol/L (ref 3.5–5.1)
Sodium: 137 mmol/L (ref 135–145)
Total Bilirubin: 0.6 mg/dL (ref 0.3–1.2)
Total Protein: 6.7 g/dL (ref 6.5–8.1)

## 2018-08-07 LAB — I-STAT TROPONIN, ED: Troponin i, poc: 0 ng/mL (ref 0.00–0.08)

## 2018-08-07 LAB — TSH: TSH: 0.777 u[IU]/mL (ref 0.350–4.500)

## 2018-08-07 LAB — MAGNESIUM: Magnesium: 2.1 mg/dL (ref 1.7–2.4)

## 2018-08-07 MED ORDER — SODIUM CHLORIDE 0.9 % IV BOLUS
1000.0000 mL | Freq: Once | INTRAVENOUS | Status: AC
  Administered 2018-08-07: 1000 mL via INTRAVENOUS

## 2018-08-07 NOTE — ED Triage Notes (Signed)
Per EMS: pt here with c/o feeling sick, foggy, and dizzy.  Pt had a similar episode yesterday, EMS was called though pt declined transport to hospital.  Pt NSR with frequent PVC's.  Pt also noted she has been under extreme stress related to work.

## 2018-08-07 NOTE — ED Notes (Signed)
ED Provider at bedside. 

## 2018-08-07 NOTE — ED Notes (Signed)
Pt states she is ready to be discharged. EDP to be notified.

## 2018-08-07 NOTE — ED Provider Notes (Signed)
[facility] EMERGENCY DEPARTMENT Provider Note   CSN: [id number] Arrival date & time: 08/07/18  0911    History   Chief Complaint No chief complaint on file. lightheaded/near syncope  HPI Pamela Booker is a 23 y.o. female.     The history is provided by the patient and medical records. No language interpreter was used.  Near Syncope  This is a new problem. The current episode started yesterday. The problem occurs rarely. The problem has not changed since onset.Pertinent negatives include no chest pain, no abdominal pain, no headaches and no shortness of breath. The symptoms are aggravated by standing. Nothing relieves the symptoms. She has tried nothing for the symptoms. The treatment provided no relief.    Past Medical History:  Diagnosis Date  . PVC's (premature ventricular contractions)     There are no active problems to display for this patient.   History reviewed. No pertinent surgical history.   OB History   No obstetric history on file.      Home Medications    Prior to Admission medications   Not on File    Family History History reviewed. No pertinent family history.  Social History Social History   Tobacco Use  . Smoking status: Not on file  Substance Use Topics  . Alcohol use: Not on file  . Drug use: Not on file     Allergies   Patient has no allergy information on record.   Review of Systems Review of Systems  Constitutional: Negative for chills, diaphoresis, fatigue and fever.  HENT: Negative for rhinorrhea.   Eyes: Negative for photophobia and visual disturbance.  Respiratory: Negative for cough, choking, chest tightness, shortness of breath and wheezing.   Cardiovascular: Positive for palpitations and near-syncope. Negative for chest pain and leg swelling.  Gastrointestinal: Negative for abdominal pain, constipation, diarrhea, nausea and vomiting.  Genitourinary: Negative for decreased urine volume, dysuria,  flank pain, frequency, hematuria, pelvic pain, vaginal bleeding, vaginal discharge and vaginal pain.  Musculoskeletal: Negative for back pain, neck pain and neck stiffness.  Skin: Negative for rash and wound.  Neurological: Positive for light-headedness. Negative for dizziness, tremors, seizures, syncope, weakness, numbness and headaches.  Psychiatric/Behavioral: Positive for confusion. Negative for agitation.  All other systems reviewed and are negative.    Physical Exam Updated Vital Signs BP 118/74 (BP Location: Left Arm)   Pulse 83   Temp 98.6 F (37 C) (Oral)   Resp 18   SpO2 98%   Physical Exam Vitals signs and nursing note reviewed.  Constitutional:      General: She is not in acute distress.    Appearance: She is well-developed. She is not ill-appearing, toxic-appearing or diaphoretic.  HENT:     Head: Normocephalic and atraumatic.     Nose: No congestion or rhinorrhea.     Mouth/Throat:     Pharynx: No oropharyngeal exudate or posterior oropharyngeal erythema.  Eyes:     Extraocular Movements: Extraocular movements intact.     Conjunctiva/sclera: Conjunctivae normal.     Pupils: Pupils are equal, round, and reactive to light.  Neck:     Musculoskeletal: Neck supple. No muscular tenderness.  Cardiovascular:     Rate and Rhythm: Normal rate and regular rhythm. Frequent extrasystoles are present.    Pulses: Normal pulses.     Heart sounds: Normal heart sounds. No murmur.  Pulmonary:     Effort: Pulmonary effort is normal. No respiratory distress.     Breath sounds: Normal breath sounds.  No wheezing, rhonchi or rales.  Chest:     Chest wall: No tenderness.  Abdominal:     General: There is no distension.     Palpations: Abdomen is soft.     Tenderness: There is no abdominal tenderness. There is no left CVA tenderness or rebound.  Musculoskeletal:        General: No tenderness.     Right lower leg: No edema.     Left lower leg: No edema.  Skin:    General: Skin  is warm and dry.     Capillary Refill: Capillary refill takes less than 2 seconds.     Findings: No erythema or rash.  Neurological:     General: No focal deficit present.     Mental Status: She is alert and oriented to person, place, and time.     Sensory: No sensory deficit.     Motor: No weakness.      ED Treatments / Results  Labs (all labs ordered are listed, but only abnormal results are displayed) Labs Reviewed  COMPREHENSIVE METABOLIC PANEL - Abnormal; Notable for the following components:      Result Value   CO2 19 (*)    All other components within normal limits  URINALYSIS, ROUTINE W REFLEX MICROSCOPIC - Abnormal; Notable for the following components:   APPearance HAZY (*)    Ketones, ur 20 (*)    Leukocytes,Ua TRACE (*)    Bacteria, UA RARE (*)    All other components within normal limits  CBC WITH DIFFERENTIAL/PLATELET  LIPASE, BLOOD  MAGNESIUM  TSH  POC URINE PREG, ED  I-STAT TROPONIN, ED    EKG EKG Interpretation  Date/Time:  Wednesday August 07 2018 09:13:53 EST Ventricular Rate:  80 PR Interval:    QRS Duration: 79 QT Interval:  376 QTC Calculation: 434 R Axis:   59 Text Interpretation:  Sinus rhythm Multiple ventricular premature complexes ST elev, probable normal early repol pattern No prior ECG for comparison.  No STEMI Confirmed by Theda Belfast ([id number]) on 08/07/2018 9:18:11 AM   Radiology No results found.  Procedures Procedures (including critical care time)  Medications Ordered in ED Medications  sodium chloride 0.9 % bolus 1,000 mL (0 mLs Intravenous Stopped 08/07/18 1211)  sodium chloride 0.9 % bolus 1,000 mL (0 mLs Intravenous Stopped 08/07/18 1434)     Initial Impression / Assessment and Plan / ED Course  I have reviewed the triage vital signs and the nursing notes.  Pertinent labs & imaging results that were available during my care of the patient were reviewed by me and considered in my medical decision making (see chart for  details).        Pamela Booker is a 23 y.o. female with a past medical history significant for PVCs who presents with lightheadedness and near syncope intermittently for the last 2 days.  Patient reports that yesterday after standing up she got very lightheaded and nearly passed out.  She reports that she has felt "fuzzy and foggy".  She reports that he is having some tingling in her face and extremities but no focal neurologic complaints otherwise.  She denies any vision changes nausea or vomiting.  She denies any actual syncopal episodes or hitting her head.  She reports that her PVCs typically worsen when she is increase in stress and she reports that her divorce finalized as this week causing immense amount of stress increase.  She thinks she is maintaining hydration but is not certain.  She  reports she is having some palpitations as well but denies chest pain or shortness of breath.  She denies any urinary symptoms or GI symptoms at this time.  She reports no leg pain or leg swelling.  No trauma or other complaints.  On exam, patient began feeling lightheadedness and symptoms of near syncope when she sat up straight.  Patient was allowed to lay flat.  Patient's lungs were clear and chest was nontender.  Abdomen was nontender.  No focal neurologic deficits on my initial exam.  No evidence of trauma.  Patient resting comfortably without tachycardia.  Patient had numerous PVCs during exam.  EKG shows no STEMI but does show multiple PVCs on the EKG.  Clinical aspect patient is dehydrated and having increase in stress causing more PVCs.  This may contribute to less cardiac output and her lightheaded symptoms.  Patient will have screen laboratory testing to look for electrolyte imbalance with the tingling as well as were given fluids for likely dehydration.  Patient will work-up including urinalysis and pregnancy test.  Patient reports he just finished her menstrual cycle several days ago but does not  have a history of significant anemia.  Will check CBC for this.  Patiently monitor with telemetry.  If work-up is reassuring, dissipate discharge home she is able to sit up and stand up without lightheadedness or near syncope again.  Low suspicion for stroke or other significant amount based on her complaint.  Low suspicion for sustained arrhythmia causing her lightheadedness.  Anticipate reassessment.  Labs overall reassuring.  Patient had some ketones in urine likely suggestive of dehydration.  Patient had leukocytes and bacteria however she assures that there is no urinary symptoms present.  Doubt UTI based on this report.  Patient was feeling better after further fluids.  Her lightheadedness had somewhat improved.  Given her reassuring work-up, we feel she is safe for discharge home.  We did not find any significant normalities and she did not have increase in PVC amount.  Given improvement in symptoms after rehydration, I feel she is a for discharge home with PCP follow-up.  Patient was understanding of plan of care and return precautions.  Suspect dehydration and stress led to her symptoms.  Patient understood return precautions and was discharged in good condition.    Final Clinical Impressions(s) / ED Diagnoses   Final diagnoses:  Lightheadedness  Palpitations  Dehydration  Orthostatic lightheadedness    ED Discharge Orders    None     Clinical Impression: 1. Lightheadedness   2. Palpitations   3. Dehydration   4. Orthostatic lightheadedness     Disposition: Discharge  Condition: Good  I have discussed the results, Dx and Tx plan with the pt(& family if present). He/she/they expressed understanding and agree(s) with the plan. Discharge instructions discussed at great length. Strict return precautions discussed and pt &/or family have verbalized understanding of the instructions. No further questions at time of discharge.    Discharge Medication List as of 08/07/2018  2:29  PM      Follow Up: [facility] [address] [place] [address] [phone] Schedule an appointment as soon as possible for a visit    [facility] EMERGENCY DEPARTMENT [address] [id number] mc [place] [address] [phone]       Tegeler, Canary Brim, MD 08/07/18 [phone]

## 2018-08-07 NOTE — Discharge Instructions (Signed)
Your work-up today was overall reassuring.  We did see evidence of dehydration on your labs.  You were able to receive 2 L of fluids to help rehydrate you.  Your reported increase in stress also may be contributing to the PVCs and palpitations that you have had before.  Please stay hydrated and rest.  Please try to decrease anxiety and stress.  Please follow-up with your primary doctor.  If any symptoms change or worsen, please return to the nearest emergency department.

## 2018-08-09 ENCOUNTER — Encounter (HOSPITAL_COMMUNITY): Payer: Self-pay | Admitting: *Deleted

## 2018-08-13 DIAGNOSIS — G43109 Migraine with aura, not intractable, without status migrainosus: Secondary | ICD-10-CM | POA: Diagnosis not present

## 2018-08-13 DIAGNOSIS — I493 Ventricular premature depolarization: Secondary | ICD-10-CM | POA: Diagnosis not present

## 2018-08-13 DIAGNOSIS — F411 Generalized anxiety disorder: Secondary | ICD-10-CM | POA: Diagnosis not present

## 2018-08-13 DIAGNOSIS — Z6824 Body mass index (BMI) 24.0-24.9, adult: Secondary | ICD-10-CM | POA: Diagnosis not present

## 2018-08-19 DIAGNOSIS — G43109 Migraine with aura, not intractable, without status migrainosus: Secondary | ICD-10-CM | POA: Diagnosis not present

## 2018-08-19 DIAGNOSIS — I493 Ventricular premature depolarization: Secondary | ICD-10-CM | POA: Diagnosis not present

## 2018-08-19 DIAGNOSIS — F411 Generalized anxiety disorder: Secondary | ICD-10-CM | POA: Diagnosis not present

## 2018-08-22 ENCOUNTER — Ambulatory Visit: Payer: Self-pay | Admitting: Cardiology

## 2018-08-23 DIAGNOSIS — F411 Generalized anxiety disorder: Secondary | ICD-10-CM | POA: Diagnosis not present

## 2018-08-23 DIAGNOSIS — R110 Nausea: Secondary | ICD-10-CM | POA: Diagnosis not present

## 2018-09-12 DIAGNOSIS — G4700 Insomnia, unspecified: Secondary | ICD-10-CM | POA: Diagnosis not present

## 2018-09-12 DIAGNOSIS — F411 Generalized anxiety disorder: Secondary | ICD-10-CM | POA: Diagnosis not present

## 2018-09-12 DIAGNOSIS — Z719 Counseling, unspecified: Secondary | ICD-10-CM | POA: Diagnosis not present

## 2018-09-12 DIAGNOSIS — G43109 Migraine with aura, not intractable, without status migrainosus: Secondary | ICD-10-CM | POA: Diagnosis not present

## 2018-09-26 ENCOUNTER — Ambulatory Visit: Payer: BC Managed Care – PPO | Admitting: Cardiology

## 2018-09-26 ENCOUNTER — Other Ambulatory Visit: Payer: Self-pay

## 2018-09-26 ENCOUNTER — Encounter: Payer: Self-pay | Admitting: Cardiology

## 2018-09-26 VITALS — Ht 62.0 in | Wt 126.0 lb

## 2018-09-26 DIAGNOSIS — R55 Syncope and collapse: Secondary | ICD-10-CM | POA: Diagnosis not present

## 2018-09-26 DIAGNOSIS — G43009 Migraine without aura, not intractable, without status migrainosus: Secondary | ICD-10-CM

## 2018-09-26 DIAGNOSIS — I493 Ventricular premature depolarization: Secondary | ICD-10-CM | POA: Diagnosis not present

## 2018-09-26 NOTE — Progress Notes (Signed)
Subjective:   Pamela Booker, female    DOB: 1996-05-14, 23 y.o.   MRN: [id number]  Chief Complaint  Patient presents with  . Palpitations    hosp f/u      Patient referred by Ria ClockPresson, Jennifer Lee H for frequent PVC's.  HPI:  Pamela EdwardsBreanna Grace Shimmel is a 23 y.o. female with history of migraines referred to us for evaluation of PVC's.   Patient was found to have PVC's several years ago while pregnant with her daughter who is now 648 years old. She was evaluated by [facility] and reports undergoing echocardiogram and monitor at that time.   Recently seen in the ER on 08/07/2018 after having some lightheadedness, near syncope, and numbness/tingling sensation in her face. Patient states that this episodes was scary as prior to being seen in ER she had inability to speak and inability to move one of her arms. It does not appear that she had evaluation for TIA or CVA while there. Noted to have frequent PVC's on EKG. She reported significant amount of stress that week as her divorce was finalized. Labs were reassuring. She was discharged home and advised follow up.   She notices palpitations are worse during times of stress or anxiety. They do not limit her activity, but she does notice them while exercising. Has not had any reoccurrence of lightheadedness. She has some discomfort associated with the PVC's. Denies any dyspnea on exertion or leg edema.   Migraines are managed by PCP and she has just recently resumed taking topiramate.   No former tobacco use. Occasional alcohol use. No illicit drug use. She is on Nuvaring for birth control, may consider getting pregnant in the next few years.  Past Medical History:  Diagnosis Date  . Anxiety   . Migraines   . Palpitations   . PVC (premature ventricular contraction)   . PVC's (premature ventricular contractions)     Past Surgical History:  Procedure Laterality Date  . LAPAROSCOPY    . TONSILLECTOMY       Family History  Adopted: Yes  Family history unknown: Yes    Social History   Socioeconomic History  . Marital status: Married    Spouse name: Not on file  . Number of children: Not on file  . Years of education: Not on file  . Highest education level: Not on file  Occupational History  . Not on file  Social Needs  . Financial resource strain: Not on file  . Food insecurity:    Worry: Not on file    Inability: Not on file  . Transportation needs:    Medical: Not on file    Non-medical: Not on file  Tobacco Use  . Smoking status: Never Smoker  Substance and Sexual Activity  . Alcohol use: No  . Drug use: No  . Sexual activity: Not on file  Lifestyle  . Physical activity:    Days per week: Not on file    Minutes per session: Not on file  . Stress: Not on file  Relationships  . Social connections:    Talks on phone: Not on file    Gets together: Not on file    Attends religious service: Not on file    Active member of club or organization: Not on file    Attends meetings of clubs or organizations: Not on file    Relationship status: Not on file  . Intimate partner violence:    Fear of  current or ex partner: Not on file    Emotionally abused: Not on file    Physically abused: Not on file    Forced sexual activity: Not on file  Other Topics Concern  . Not on file  Social History Narrative   ** Merged History Encounter **        Current Outpatient Medications on File Prior to Visit  Medication Sig Dispense Refill  . acetaminophen (TYLENOL) 325 MG tablet Take 650 mg by mouth every 6 (six) hours as needed for pain.    Marland Kitchen ALPRAZolam (XANAX) 0.25 MG tablet Take 1 tablet by mouth as needed.    Marland Kitchen escitalopram (LEXAPRO) 5 MG tablet Take 5 mg by mouth daily.    Marland Kitchen estradiol (ESTRACE) 0.1 MG/GM vaginal cream Place 1 Applicatorful vaginally 3 (three) times a week.    Marland Kitchen NUVARING 0.12-0.015 MG/24HR vaginal ring 1 each by Intrauterine route every 30 (thirty) days.    Marland Kitchen  topiramate ER (QUDEXY XR) 50 MG CS24 sprinkle capsule Take 100 mg by mouth daily.    . traMADol (ULTRAM) 50 MG tablet Take 50 mg by mouth as needed.    . valACYclovir (VALTREX) 500 MG tablet Take 500 mg by mouth daily.     No current facility-administered medications on file prior to visit.      Review of Systems  Constitution: Negative for decreased appetite, malaise/fatigue, weight gain and weight loss.  Eyes: Negative for visual disturbance.  Cardiovascular: Positive for palpitations. Negative for chest pain, claudication, dyspnea on exertion, leg swelling, orthopnea and syncope.  Respiratory: Negative for hemoptysis and wheezing.   Endocrine: Negative for cold intolerance and heat intolerance.  Hematologic/Lymphatic: Does not bruise/bleed easily.  Skin: Negative for nail changes.  Musculoskeletal: Negative for muscle weakness and myalgias.  Gastrointestinal: Negative for abdominal pain, change in bowel habit, nausea and vomiting.  Neurological: Negative for difficulty with concentration, dizziness, focal weakness and headaches.  Psychiatric/Behavioral: Negative for altered mental status and suicidal ideas.  All other systems reviewed and are negative.      Objective:     Height 5\' 2"  (1.575 m), weight 126 lb (57.2 kg), unknown if currently breastfeeding.  Physical Exam  Constitutional: She is oriented to person, place, and time. She appears well-developed and well-nourished. No distress.  Pulmonary/Chest: Effort normal. No respiratory distress.  Neurological: She is alert and oriented to person, place, and time.  Psychiatric: She has a normal mood and affect. Her behavior is normal.    Radiology:  Cardiac studies:   2 week event monitor 05/17/16-06/15/2016: No arrhythmias.   Laboratory examination:  CBC    Component Value Date/Time   WBC 6.1 08/07/2018 0947   RBC 4.49 08/07/2018 0947   HGB 14.4 08/07/2018 0947   HCT 44.6 08/07/2018 0947   PLT 248 08/07/2018 0947    MCV 99.3 08/07/2018 0947   MCH 32.1 08/07/2018 0947   MCHC 32.3 08/07/2018 0947   RDW 11.7 08/07/2018 0947   LYMPHSABS 1.6 08/07/2018 0947   MONOABS 0.5 08/07/2018 0947   EOSABS 0.0 08/07/2018 0947   BASOSABS 0.0 08/07/2018 0947    CMP     Component Value Date/Time   NA 137 08/07/2018 0947   K 4.0 08/07/2018 0947   CL 109 08/07/2018 0947   CO2 19 (L) 08/07/2018 0947   GLUCOSE 84 08/07/2018 0947   BUN 9 08/07/2018 0947   CREATININE 0.82 08/07/2018 0947   CALCIUM 9.7 08/07/2018 0947   PROT 6.7 08/07/2018 0947   ALBUMIN  4.1 08/07/2018 0947   AST 20 08/07/2018 0947   ALT 21 08/07/2018 0947   ALKPHOS 54 08/07/2018 0947   BILITOT 0.6 08/07/2018 0947   GFRNONAA >60 08/07/2018 0947   GFRAA >60 08/07/2018 0947           Assessment & Recommendations:   PVC (premature ventricular contraction)  Near syncope  Migraine with aura and with status migrainosus, not intractable    Recommendations: Patient is referred to me for evaluation of recent episode of frequent PVC's and associated TIA like symptoms and near syncope. Patient has had symptomatic PVC's for the last several years and they appear to wax and wane depending upon her stress level. PVC's do not limit her activity, but she is aware of them. I would like to hold off on medical therapy unless absolutely necessary in view of her young age. I have asked her to try conservative measures first such as working to control stress/anxiety and to also try B6 and B12 vitamins. If she continues to have frequent PVC's that are bothersome to her despite this, will consider medication at that time. It appears that she has worn several monitors in the past that were unyielding. Feel that she would likely benefit from repeating monitor given her frequent episodes. If she has high PVC burden, may consider going ahead with starting therapy.   I suspect that her recent TIA like episode was associated with complex migraine as she has history  of this. Given correlation of PFO and migraines, and also her frequent PVC's, will obtain echocardiogram with bubble study for further evaluation and to see if she has PFO present. She had one episode of facial tingling/numbness the next day after being seen in ER; however, has not had any reoccurrence since. Will continue to monitor.  I will see her back in the office in 4-6 weeks after testing for follow up.   Thank you for referring this patient. Please do not hesitate to contact me for any questions.    *I have discussed this case with Dr. Rosemary Holms and he participated in formulating the plan.Altamese Amalga, MSN, APRN, FNP-C [facility] Office: [phone] Fax: [phone]

## 2018-09-30 DIAGNOSIS — G4700 Insomnia, unspecified: Secondary | ICD-10-CM | POA: Diagnosis not present

## 2018-09-30 DIAGNOSIS — G43109 Migraine with aura, not intractable, without status migrainosus: Secondary | ICD-10-CM | POA: Diagnosis not present

## 2018-09-30 DIAGNOSIS — F411 Generalized anxiety disorder: Secondary | ICD-10-CM | POA: Diagnosis not present

## 2018-10-01 ENCOUNTER — Encounter: Payer: Self-pay | Admitting: Cardiology

## 2018-10-02 ENCOUNTER — Ambulatory Visit: Payer: BLUE CROSS/BLUE SHIELD

## 2018-10-02 ENCOUNTER — Other Ambulatory Visit: Payer: Self-pay

## 2018-10-02 DIAGNOSIS — I493 Ventricular premature depolarization: Secondary | ICD-10-CM

## 2018-10-04 DIAGNOSIS — R55 Syncope and collapse: Secondary | ICD-10-CM | POA: Diagnosis not present

## 2018-10-04 DIAGNOSIS — R002 Palpitations: Secondary | ICD-10-CM | POA: Diagnosis not present

## 2018-10-14 ENCOUNTER — Telehealth: Payer: Self-pay | Admitting: Cardiology

## 2018-10-14 DIAGNOSIS — I493 Ventricular premature depolarization: Secondary | ICD-10-CM

## 2018-10-14 MED ORDER — PROPRANOLOL HCL 20 MG PO TABS: 20.0000 mg | ORAL_TABLET | Freq: Two times a day (BID) | ORAL | 1 refills | Status: DC

## 2018-10-14 NOTE — Telephone Encounter (Signed)
Discussed holter monitor results with patient over the phone. She reports having significant symptoms from PVC's over the weekend. As she is symptomatic and fairly high PVC burden, will start low dose propanolol. May also help with anxiety and migraines. She will monitor BP at home and notify me for any concerns or questions. Continue with echo as ordered and follow up.

## 2018-10-29 DIAGNOSIS — G4700 Insomnia, unspecified: Secondary | ICD-10-CM | POA: Diagnosis not present

## 2018-10-29 DIAGNOSIS — F411 Generalized anxiety disorder: Secondary | ICD-10-CM | POA: Diagnosis not present

## 2018-10-29 DIAGNOSIS — I493 Ventricular premature depolarization: Secondary | ICD-10-CM | POA: Diagnosis not present

## 2018-10-30 ENCOUNTER — Ambulatory Visit (INDEPENDENT_AMBULATORY_CARE_PROVIDER_SITE_OTHER): Payer: BC Managed Care – PPO

## 2018-10-30 ENCOUNTER — Other Ambulatory Visit: Payer: Self-pay

## 2018-10-30 DIAGNOSIS — G43009 Migraine without aura, not intractable, without status migrainosus: Secondary | ICD-10-CM | POA: Diagnosis not present

## 2018-10-30 DIAGNOSIS — I493 Ventricular premature depolarization: Secondary | ICD-10-CM

## 2018-11-12 DIAGNOSIS — F411 Generalized anxiety disorder: Secondary | ICD-10-CM | POA: Diagnosis not present

## 2018-11-12 DIAGNOSIS — I493 Ventricular premature depolarization: Secondary | ICD-10-CM | POA: Diagnosis not present

## 2018-11-12 DIAGNOSIS — K5900 Constipation, unspecified: Secondary | ICD-10-CM | POA: Diagnosis not present

## 2018-11-20 DIAGNOSIS — H18891 Other specified disorders of cornea, right eye: Secondary | ICD-10-CM | POA: Diagnosis not present

## 2018-11-21 DIAGNOSIS — M542 Cervicalgia: Secondary | ICD-10-CM | POA: Diagnosis not present

## 2018-11-22 DIAGNOSIS — H18891 Other specified disorders of cornea, right eye: Secondary | ICD-10-CM | POA: Diagnosis not present

## 2018-11-27 ENCOUNTER — Ambulatory Visit (INDEPENDENT_AMBULATORY_CARE_PROVIDER_SITE_OTHER): Payer: BC Managed Care – PPO | Admitting: Cardiology

## 2018-11-27 ENCOUNTER — Encounter: Payer: Self-pay | Admitting: Cardiology

## 2018-11-27 ENCOUNTER — Other Ambulatory Visit: Payer: Self-pay

## 2018-11-27 VITALS — BP 129/78 | HR 62 | Temp 99.6°F | Ht 62.0 in | Wt 133.0 lb

## 2018-11-27 DIAGNOSIS — R55 Syncope and collapse: Secondary | ICD-10-CM

## 2018-11-27 DIAGNOSIS — G43009 Migraine without aura, not intractable, without status migrainosus: Secondary | ICD-10-CM | POA: Diagnosis not present

## 2018-11-27 DIAGNOSIS — I493 Ventricular premature depolarization: Secondary | ICD-10-CM

## 2018-11-27 MED ORDER — PROPRANOLOL HCL ER 80 MG PO CP24: 80.0000 mg | ORAL_CAPSULE | Freq: Every day | ORAL | 1 refills | Status: DC

## 2018-11-27 NOTE — Progress Notes (Signed)
Primary Physician:  Lewis Moccasinewey, Elizabeth R, MD   Patient ID: Pamela Booker, female    DOB: 28-Sep-1995, 23 y.o.   MRN: [id number]  Subjective:    Chief Complaint  Patient presents with  . PVC  . Results    echo  . Follow-up    HPI: Pamela Booker  is a 23 y.o. female  with history of migraines, recently evaluated by us for occasional PVC's and episode of questionable TIA vs. Complex migraine.  She has also had episodes of syncope and near syncope and symptoms started since March 2020. n 08/07/2018 of lightheadedness, near syncope, and numbness/tingling sensation in her face. Patient states that this episodes was scary as prior to being seen in ER she had inability to speak and inability to move one of her arms. It does not appear that she had evaluation for TIA or CVA while there. Noted to have frequent PVC's on EKG. She reported significant amount of stress that week as her divorce was finalized.   Patient underwent echocardiogram and 48 hour holter monitor and is here to discuss results. Patient was found to have PVC's several years ago while pregnant; however, recently found to have frequent PVC's. Holter monitor revealed PVC burden of 6%. I had started her on Propanolol for her PVC's and also migraines.   No history of tobacco use or illicit drug use, drinks alcohol socially.  Symptoms of near syncope and dizziness and palpitations are improved since being on propranolol.  Prior to starting propranolol, due to severe mitral headaches, she was started on topiramate and since then has noticed significant improvement in migraines as well.  Past Medical History:  Diagnosis Date  . Anxiety   . Migraines   . Palpitations   . PVC (premature ventricular contraction)   . PVC's (premature ventricular contractions)     Past Surgical History:  Procedure Laterality Date  . LAPAROSCOPY    . TONSILLECTOMY      Social History   Socioeconomic History  . Marital status:  Divorced    Spouse name: Not on file  . Number of children: 1  . Years of education: Not on file  . Highest education level: Not on file  Occupational History  . Not on file  Social Needs  . Financial resource strain: Not on file  . Food insecurity    Worry: Not on file    Inability: Not on file  . Transportation needs    Medical: Not on file    Non-medical: Not on file  Tobacco Use  . Smoking status: Never Smoker  . Smokeless tobacco: Never Used  Substance and Sexual Activity  . Alcohol use: Yes    Comment: socially  . Drug use: No  . Sexual activity: Not on file  Lifestyle  . Physical activity    Days per week: Not on file    Minutes per session: Not on file  . Stress: Not on file  Relationships  . Social Musicianconnections    Talks on phone: Not on file    Gets together: Not on file    Attends religious service: Not on file    Active member of club or organization: Not on file    Attends meetings of clubs or organizations: Not on file    Relationship status: Not on file  . Intimate partner violence    Fear of current or ex partner: Not on file    Emotionally abused: Not on file    Physically  abused: Not on file    Forced sexual activity: Not on file  Other Topics Concern  . Not on file  Social History Narrative   ** Merged History Encounter **        Review of Systems  Constitution: Negative for decreased appetite, malaise/fatigue, weight gain and weight loss.  Eyes: Negative for visual disturbance.  Cardiovascular: Positive for palpitations. Negative for chest pain, claudication, dyspnea on exertion, leg swelling, orthopnea and syncope.  Respiratory: Negative for hemoptysis and wheezing.   Endocrine: Negative for cold intolerance and heat intolerance.  Hematologic/Lymphatic: Does not bruise/bleed easily.  Skin: Negative for nail changes.  Musculoskeletal: Negative for muscle weakness and myalgias.  Gastrointestinal: Negative for abdominal pain, change in bowel  habit, nausea and vomiting.  Neurological: Positive for dizziness. Negative for difficulty with concentration, focal weakness and headaches.  Psychiatric/Behavioral: Negative for altered mental status, substance abuse and suicidal ideas.  All other systems reviewed and are negative.     Objective:  Blood pressure 129/78, pulse 62, temperature 99.6 F (37.6 C), height 5\' 2"  (1.575 m), weight 133 lb (60.3 kg), SpO2 97 %, unknown if currently breastfeeding. Body mass index is 24.33 kg/m.    Physical Exam  Constitutional: She appears well-developed and well-nourished. No distress.  HENT:  Head: Atraumatic.  Eyes: Conjunctivae are normal.  Neck: Neck supple. No JVD present. No thyromegaly present.  Cardiovascular: Normal rate, regular rhythm, normal heart sounds and intact distal pulses. Frequent extrasystoles are present. Exam reveals no gallop.  No murmur heard. Pulmonary/Chest: Effort normal and breath sounds normal.  Abdominal: Soft. Bowel sounds are normal.  Musculoskeletal: Normal range of motion.  Neurological: She is alert.  Skin: Skin is warm and dry.  Psychiatric: She has a normal mood and affect.   Radiology: No results found.  Laboratory examination:    CMP Latest Ref Rng & Units 08/07/2018 03/05/2013 12/26/2012  Glucose 70 - 99 mg/dL 84 82 93  BUN 6 - 20 mg/dL 9 15 13   Creatinine 0.44 - 1.00 mg/dL 1.470.82 8.290.59 5.620.52  Sodium 135 - 145 mmol/L 137 142 136  Potassium 3.5 - 5.1 mmol/L 4.0 3.6 3.9  Chloride 98 - 111 mmol/L 109 105 103  CO2 22 - 32 mmol/L 19(L) 27 27  Calcium 8.9 - 10.3 mg/dL 9.7 9.5 9.8  Total Protein 6.5 - 8.1 g/dL 6.7 7.0 6.9  Total Bilirubin 0.3 - 1.2 mg/dL 0.6 0.3 0.3  Alkaline Phos 38 - 126 U/L 54 73 84  AST 15 - 41 U/L 20 20 21   ALT 0 - 44 U/L 21 21 15    CBC Latest Ref Rng & Units 08/07/2018 03/05/2013 12/26/2012  WBC 4.0 - 10.5 K/uL 6.1 9.0 7.6  Hemoglobin 12.0 - 15.0 g/dL 13.014.4 86.514.0 78.413.3  Hematocrit 36.0 - 46.0 % 44.6 39.0 38.6  Platelets 150 - 400  K/uL 248 251 231   Lipid Panel  No results found for: CHOL, TRIG, HDL, CHOLHDL, VLDL, LDLCALC, LDLDIRECT HEMOGLOBIN A1C Lab Results  Component Value Date   HGBA1C  07/16/2010    5.1 (NOTE)                                                                       According to the ADA  Clinical Practice Recommendations for 2011, when HbA1c is used as a screening test:   >=6.5%   Diagnostic of Diabetes Mellitus           (if abnormal result  is confirmed)  5.7-6.4%   Increased risk of developing Diabetes Mellitus  References:Diagnosis and Classification of Diabetes Mellitus,Diabetes Care,2011,34(Suppl 1):S62-S69 and Standards of Medical Care in         Diabetes - 2011,Diabetes Care,2011,34  (Suppl 1):S11-S61.   MPG 100 07/16/2010   TSH Recent Labs    08/07/18 0947  TSH 0.777    PRN Meds:. Medications Discontinued During This Encounter  Medication Reason  . traMADol (ULTRAM) 50 MG tablet Completed Course  . propranolol (INDERAL) 20 MG tablet Change in therapy   Current Meds  Medication Sig  . acetaminophen (TYLENOL) 325 MG tablet Take 650 mg by mouth every 6 (six) hours as needed for pain.  Marland Kitchen. ALPRAZolam (XANAX) 0.25 MG tablet Take 1 tablet by mouth as needed.  Marland Kitchen. escitalopram (LEXAPRO) 20 MG tablet Take 20 mg by mouth daily.   Marland Kitchen. estradiol (ESTRACE) 0.1 MG/GM vaginal cream Place 1 Applicatorful vaginally 3 (three) times a week.  Marland Kitchen. NUVARING 0.12-0.015 MG/24HR vaginal ring 1 each by Intrauterine route every 30 (thirty) days.  Marland Kitchen. topiramate ER (QUDEXY XR) 50 MG CS24 sprinkle capsule Take 100 mg by mouth daily.  . valACYclovir (VALTREX) 500 MG tablet Take 500 mg by mouth daily.  . [DISCONTINUED] propranolol (INDERAL) 20 MG tablet Take 1 tablet (20 mg total) by mouth 2 (two) times daily.    Cardiac Studies:   48 hour holter monitor 10/02/2018: NSR. Occasional PVC's (6.2%). No SVT or A fib was noted.   Echocardiogram 10/30/2018: Normal LV systolic function with EF 55%. Left ventricle cavity  is normal in size. Normal left ventricular wall thickness. Normal  global wall motion. Normal diastolic filling pattern. Calculated EF 55%. Left atrial cavity is normal in size. Intact interatrial septum. No shunting seen on agitated saline study.   Assessment:   PVC (premature ventricular contraction) - Plan: EKG 12-Lead, propranolol ER (INDERAL LA) 80 MG 24 hr capsule  Migraine without aura and without status migrainosus, not intractable   Vasovagal syncope  EKG 11/27/2018: Normal sinus rhythm at rate of 74 bpm, normal axis.  Incomplete right bundle branch block.  Normal EKG.  Recommendations:   Patient is here for a 6 week follow-up visit of syncope, symptoms of syncope are consistent with neurocardiogenic syncope/vasovagal syncope.  I do not suspect complex migraine or suspect cardiac arrhythmic etiology as she had premonitory symptoms prior to the episodes. I have discussed with the patient regarding avoidance of dehydration and also with episodes of dizziness onset, to lay down on the ground with feet raised up.  I also discussed Counter pressure maneuvers discussed with the patient.   She does continue to have frequent PVCs as noted on my physical exam today although EKG was not performed. PVC burden is overall low on her event monitoring.  Also LVEF was normal by echocardiogram.  I will discontinue short-acting propranolol and switch her to long-acting propranolol which will help both with palpitations, potential neurocardiac syncope and also migraine headaches.  She can certainly try to wean herself off of topiramate to see whether propranolol has helped her with migraine headaches.  Reviewed the echocardiogram the patient, she has no PFO.  I'd like to see her back in 6 months for follow-up of palpitations, PVCs and also syncope.  Palpitations, symptoms suggest PAC/PVC: I  have reassured the patient. I will emperically try Vit B1 (Thiamine) 50 mg, Vit B6 (Pyrodoxine) 50 mg and Vit B12  (rappid release) 1095mcg, ONCE OR twice daily for palpitations and tachycardia. Consider Fish oil capsule daily with food. Also Propranolol would help. OV IN 6 MONTHS.   Patient is aware not to get pregnant on cardiac medications.  Adrian Prows, MD, [place] 11/27/2018, 3:37 PM [facility]. [place] Pager: [phone] Office: [phone] If no answer Cell [phone]

## 2018-12-02 ENCOUNTER — Ambulatory Visit (HOSPITAL_COMMUNITY)
Admission: EM | Admit: 2018-12-02 | Discharge: 2018-12-02 | Disposition: A | Discharge status: Home / Self Care | Payer: BC Managed Care – PPO | Attending: Family Medicine | Admitting: Family Medicine

## 2018-12-02 ENCOUNTER — Ambulatory Visit (INDEPENDENT_AMBULATORY_CARE_PROVIDER_SITE_OTHER): Payer: BC Managed Care – PPO

## 2018-12-02 ENCOUNTER — Other Ambulatory Visit: Payer: Self-pay

## 2018-12-02 ENCOUNTER — Encounter (HOSPITAL_COMMUNITY): Payer: Self-pay

## 2018-12-02 DIAGNOSIS — M79671 Pain in right foot: Secondary | ICD-10-CM

## 2018-12-02 DIAGNOSIS — S93491A Sprain of other ligament of right ankle, initial encounter: Secondary | ICD-10-CM

## 2018-12-02 DIAGNOSIS — S99911A Unspecified injury of right ankle, initial encounter: Secondary | ICD-10-CM | POA: Diagnosis not present

## 2018-12-02 DIAGNOSIS — S99921A Unspecified injury of right foot, initial encounter: Secondary | ICD-10-CM | POA: Diagnosis not present

## 2018-12-02 DIAGNOSIS — M7989 Other specified soft tissue disorders: Secondary | ICD-10-CM | POA: Diagnosis not present

## 2018-12-02 DIAGNOSIS — M25571 Pain in right ankle and joints of right foot: Secondary | ICD-10-CM | POA: Diagnosis not present

## 2018-12-02 MED ORDER — HYDROCODONE-ACETAMINOPHEN 5-325 MG PO TABS: 1.0000 | ORAL_TABLET | Freq: Four times a day (QID) | ORAL | 0 refills | Status: DC | PRN
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
## 2018-12-02 NOTE — Discharge Instructions (Addendum)
Ice, elevate, limit weightbearing Take pain medication as needed.  Do not drive on hydrocodone Expect improvement over the next couple weeks.  See your PCP if you fail to improve

## 2018-12-12 ENCOUNTER — Telehealth: Payer: Self-pay

## 2018-12-12 NOTE — Telephone Encounter (Signed)
I can try one more medication, Verapamil SR 180 mg daily and she can stop Propranolol. If she agrees, please send Rx for Migraine indication

## 2018-12-12 NOTE — Telephone Encounter (Signed)
Pt called stating that she was told to taper of topamax and increase her propranolol to see if it would help with her headaches. She reports that it has not and she is having them everyday. Please advise. Pt said that she was on AK schedule last ov but was seen by you.//ah

## 2018-12-13 ENCOUNTER — Telehealth: Payer: Self-pay

## 2018-12-13 NOTE — Telephone Encounter (Signed)
Will verapamil help her PVc's?

## 2018-12-14 NOTE — Telephone Encounter (Signed)
Yes

## 2018-12-15 DIAGNOSIS — Z20828 Contact with and (suspected) exposure to other viral communicable diseases: Secondary | ICD-10-CM | POA: Diagnosis not present

## 2018-12-15 DIAGNOSIS — R509 Fever, unspecified: Secondary | ICD-10-CM | POA: Diagnosis not present

## 2018-12-15 DIAGNOSIS — R05 Cough: Secondary | ICD-10-CM | POA: Diagnosis not present

## 2018-12-16 ENCOUNTER — Telehealth: Payer: Self-pay | Admitting: General Practice

## 2018-12-16 ENCOUNTER — Other Ambulatory Visit: Payer: Self-pay

## 2018-12-16 DIAGNOSIS — G43109 Migraine with aura, not intractable, without status migrainosus: Secondary | ICD-10-CM | POA: Diagnosis not present

## 2018-12-16 DIAGNOSIS — G4700 Insomnia, unspecified: Secondary | ICD-10-CM | POA: Diagnosis not present

## 2018-12-16 DIAGNOSIS — F411 Generalized anxiety disorder: Secondary | ICD-10-CM | POA: Diagnosis not present

## 2018-12-16 DIAGNOSIS — Z20822 Contact with and (suspected) exposure to covid-19: Secondary | ICD-10-CM

## 2018-12-16 DIAGNOSIS — I493 Ventricular premature depolarization: Secondary | ICD-10-CM | POA: Diagnosis not present

## 2018-12-16 MED ORDER — VERAPAMIL HCL ER 180 MG PO TBCR: 180.0000 mg | EXTENDED_RELEASE_TABLET | Freq: Every day | ORAL | 1 refills | Status: DC

## 2018-12-16 NOTE — Addendum Note (Signed)
Addended by: Dimple Nanas on: 12/16/2018 02:56 PM   Modules accepted: Orders

## 2018-12-16 NOTE — Telephone Encounter (Signed)
Pt aware and ok with change.//ah

## 2018-12-16 NOTE — Telephone Encounter (Signed)
Pt has been scheduled for covid testing. Scheduled apt with pt directly. Pt is scheduled for [facility] testing site.

## 2018-12-16 NOTE — Telephone Encounter (Signed)
Pamela Booker with Dr Elizabeth Dewey office called in requesting covid testing for pt.  °

## 2018-12-17 ENCOUNTER — Other Ambulatory Visit: Payer: BC Managed Care – PPO

## 2018-12-17 DIAGNOSIS — R6889 Other general symptoms and signs: Secondary | ICD-10-CM | POA: Diagnosis not present

## 2018-12-17 DIAGNOSIS — Z20822 Contact with and (suspected) exposure to covid-19: Secondary | ICD-10-CM

## 2018-12-21 LAB — NOVEL CORONAVIRUS, NAA: SARS-CoV-2, NAA: NOT DETECTED

## 2018-12-23 DIAGNOSIS — Z20828 Contact with and (suspected) exposure to other viral communicable diseases: Secondary | ICD-10-CM | POA: Diagnosis not present

## 2018-12-23 DIAGNOSIS — J069 Acute upper respiratory infection, unspecified: Secondary | ICD-10-CM | POA: Diagnosis not present

## 2018-12-27 DIAGNOSIS — S7000XA Contusion of unspecified hip, initial encounter: Secondary | ICD-10-CM | POA: Diagnosis not present

## 2018-12-27 DIAGNOSIS — S301XXA Contusion of abdominal wall, initial encounter: Secondary | ICD-10-CM | POA: Diagnosis not present

## 2019-01-06 DIAGNOSIS — G43109 Migraine with aura, not intractable, without status migrainosus: Secondary | ICD-10-CM | POA: Diagnosis not present

## 2019-01-06 DIAGNOSIS — G4700 Insomnia, unspecified: Secondary | ICD-10-CM | POA: Diagnosis not present

## 2019-01-06 DIAGNOSIS — F411 Generalized anxiety disorder: Secondary | ICD-10-CM | POA: Diagnosis not present

## 2019-01-13 ENCOUNTER — Telehealth: Payer: Self-pay

## 2019-01-13 NOTE — Telephone Encounter (Signed)
Pt called stating that she had some cp last night and mid afternoon today. No cp during conversation with pt. Heart skipping during cp. Some nausea last night. No otc tried. Please advise.//ah

## 2019-01-13 NOTE — Telephone Encounter (Signed)
Can you ask Miquel Dunn or I have to do this?

## 2019-01-14 NOTE — Telephone Encounter (Signed)
Pt aware of instructions.//ah

## 2019-01-14 NOTE — Telephone Encounter (Signed)
CP at rest, With deep breathing but not with movement, not only associated with palps, tolerating Verapamil ok.

## 2019-01-14 NOTE — Telephone Encounter (Signed)
Please review and advise.

## 2019-01-28 DIAGNOSIS — F411 Generalized anxiety disorder: Secondary | ICD-10-CM | POA: Diagnosis not present

## 2019-02-11 ENCOUNTER — Ambulatory Visit: Payer: BC Managed Care – PPO | Admitting: Psychiatry

## 2019-02-13 DIAGNOSIS — N9419 Other specified dyspareunia: Secondary | ICD-10-CM | POA: Diagnosis not present

## 2019-02-13 DIAGNOSIS — Z3044 Encounter for surveillance of vaginal ring hormonal contraceptive device: Secondary | ICD-10-CM | POA: Diagnosis not present

## 2019-02-13 DIAGNOSIS — N898 Other specified noninflammatory disorders of vagina: Secondary | ICD-10-CM | POA: Diagnosis not present

## 2019-02-13 DIAGNOSIS — N809 Endometriosis, unspecified: Secondary | ICD-10-CM | POA: Diagnosis not present

## 2019-02-26 DIAGNOSIS — R102 Pelvic and perineal pain: Secondary | ICD-10-CM | POA: Diagnosis not present

## 2019-02-26 DIAGNOSIS — N809 Endometriosis, unspecified: Secondary | ICD-10-CM | POA: Diagnosis not present

## 2019-02-26 DIAGNOSIS — N898 Other specified noninflammatory disorders of vagina: Secondary | ICD-10-CM | POA: Diagnosis not present

## 2019-03-10 DIAGNOSIS — N390 Urinary tract infection, site not specified: Secondary | ICD-10-CM | POA: Diagnosis not present

## 2019-03-11 ENCOUNTER — Emergency Department (HOSPITAL_COMMUNITY)
Admission: EM | Admit: 2019-03-11 | Discharge: 2019-03-11 | Disposition: A | Discharge status: Home / Self Care | Payer: BC Managed Care – PPO | Attending: Emergency Medicine | Admitting: Emergency Medicine

## 2019-03-11 ENCOUNTER — Emergency Department (HOSPITAL_COMMUNITY): Payer: BC Managed Care – PPO

## 2019-03-11 ENCOUNTER — Encounter (HOSPITAL_COMMUNITY): Payer: Self-pay | Admitting: Emergency Medicine

## 2019-03-11 DIAGNOSIS — N390 Urinary tract infection, site not specified: Secondary | ICD-10-CM | POA: Diagnosis not present

## 2019-03-11 DIAGNOSIS — Z79899 Other long term (current) drug therapy: Secondary | ICD-10-CM | POA: Insufficient documentation

## 2019-03-11 DIAGNOSIS — I493 Ventricular premature depolarization: Secondary | ICD-10-CM | POA: Insufficient documentation

## 2019-03-11 DIAGNOSIS — R479 Unspecified speech disturbances: Secondary | ICD-10-CM | POA: Insufficient documentation

## 2019-03-11 DIAGNOSIS — R0902 Hypoxemia: Secondary | ICD-10-CM | POA: Diagnosis not present

## 2019-03-11 DIAGNOSIS — R519 Headache, unspecified: Secondary | ICD-10-CM

## 2019-03-11 DIAGNOSIS — R29810 Facial weakness: Secondary | ICD-10-CM | POA: Diagnosis not present

## 2019-03-11 DIAGNOSIS — G43909 Migraine, unspecified, not intractable, without status migrainosus: Secondary | ICD-10-CM | POA: Diagnosis not present

## 2019-03-11 DIAGNOSIS — R4781 Slurred speech: Secondary | ICD-10-CM | POA: Diagnosis not present

## 2019-03-11 DIAGNOSIS — R531 Weakness: Secondary | ICD-10-CM | POA: Diagnosis not present

## 2019-03-11 LAB — URINALYSIS, ROUTINE W REFLEX MICROSCOPIC
Bilirubin Urine: NEGATIVE
Glucose, UA: >=500 mg/dL — AB
Hgb urine dipstick: NEGATIVE
Ketones, ur: NEGATIVE mg/dL
Nitrite: NEGATIVE
Protein, ur: NEGATIVE mg/dL
Specific Gravity, Urine: 1.009 (ref 1.005–1.030)
pH: 7 (ref 5.0–8.0)

## 2019-03-11 LAB — COMPREHENSIVE METABOLIC PANEL
ALT: 42 U/L (ref 0–44)
AST: 23 U/L (ref 15–41)
Albumin: 3.8 g/dL (ref 3.5–5.0)
Alkaline Phosphatase: 45 U/L (ref 38–126)
Anion gap: 11 (ref 5–15)
BUN: 6 mg/dL (ref 6–20)
CO2: 20 mmol/L — ABNORMAL LOW (ref 22–32)
Calcium: 9.4 mg/dL (ref 8.9–10.3)
Chloride: 109 mmol/L (ref 98–111)
Creatinine, Ser: 0.78 mg/dL (ref 0.44–1.00)
GFR calc Af Amer: >60 mL/min (ref >60)
GFR calc non Af Amer: >60 mL/min (ref >60)
Glucose, Bld: 86 mg/dL (ref 70–99)
Potassium: 3.9 mmol/L (ref 3.5–5.1)
Sodium: 140 mmol/L (ref 135–145)
Total Bilirubin: 0.7 mg/dL (ref 0.3–1.2)
Total Protein: 6.7 g/dL (ref 6.5–8.1)

## 2019-03-11 LAB — CBC WITH DIFFERENTIAL/PLATELET
Abs Immature Granulocytes: 0.01 10*3/uL (ref 0.00–0.07)
Basophils Absolute: 0 10*3/uL (ref 0.0–0.1)
Basophils Relative: 0 %
Eosinophils Absolute: 0 10*3/uL (ref 0.0–0.5)
Eosinophils Relative: 0 %
HCT: 42.5 % (ref 36.0–46.0)
Hemoglobin: 14.4 g/dL (ref 12.0–15.0)
Immature Granulocytes: 0 %
Lymphocytes Relative: 26 %
Lymphs Abs: 1.9 10*3/uL (ref 0.7–4.0)
MCH: 33.8 pg (ref 26.0–34.0)
MCHC: 33.9 g/dL (ref 30.0–36.0)
MCV: 99.8 fL (ref 80.0–100.0)
Monocytes Absolute: 0.5 10*3/uL (ref 0.1–1.0)
Monocytes Relative: 7 %
Neutro Abs: 4.7 10*3/uL (ref 1.7–7.7)
Neutrophils Relative %: 67 %
Platelets: 267 10*3/uL (ref 150–400)
RBC: 4.26 MIL/uL (ref 3.87–5.11)
RDW: 12.9 % (ref 11.5–15.5)
WBC: 7.1 10*3/uL (ref 4.0–10.5)
nRBC: 0 % (ref 0.0–0.2)

## 2019-03-11 LAB — PREGNANCY, URINE: Preg Test, Ur: NEGATIVE

## 2019-03-11 MED ORDER — CEPHALEXIN 500 MG PO CAPS: 500.0000 mg | ORAL_CAPSULE | Freq: Four times a day (QID) | ORAL | 0 refills | Status: DC

## 2019-03-11 NOTE — ED Provider Notes (Signed)
[facility] EMERGENCY DEPARTMENT Provider Note   CSN: [id number] Arrival date & time: 03/11/19  1101     History   Chief Complaint Chief Complaint  Patient presents with  . Migraine    HPI Pamela Booker is a 23 y.o. female.     Pt reports she had a headache today and then had an episode of stuttering that lasted 45 minutes.  Pt reports all symptoms resolved.  Pt reports she had a similar episode in the past.  Pt reports she saw a cardiologist for pvc's  Pt reports she had a similar episode in the past several months ago that resolved   The history is provided by the patient. No language interpreter was used.  Migraine This is a new problem. The current episode started less than 1 hour ago. The problem occurs constantly. The problem has not changed since onset.Associated symptoms include headaches. Nothing aggravates the symptoms. Nothing relieves the symptoms. She has tried nothing for the symptoms. The treatment provided no relief.    Past Medical History:  Diagnosis Date  . Anxiety   . Migraines   . Palpitations   . PVC (premature ventricular contraction)   . PVC's (premature ventricular contractions)     There are no active problems to display for this patient.   Past Surgical History:  Procedure Laterality Date  . LAPAROSCOPY    . TONSILLECTOMY       OB History    Gravida  1   Para  0   Term  0   Preterm  0   AB  0   Living        SAB  0   TAB  0   Ectopic  0   Multiple      Live Births               Home Medications    Prior to Admission medications   Medication Sig Start Date End Date Taking? Authorizing Provider  acetaminophen (TYLENOL) 325 MG tablet Take 650 mg by mouth every 6 (six) hours as needed for pain.   Yes [provider]  ALPRAZolam (XANAX) 0.25 MG tablet Take 1 tablet by mouth as needed. 08/23/18  Yes [provider]  escitalopram (LEXAPRO) 20 MG tablet Take 20 mg by mouth  daily.  08/23/18  Yes [provider]  norethindrone (AYGESTIN) 5 MG tablet Take 5 mg by mouth daily. 02/26/19 08/25/19 Yes [provider]  topiramate ER (QUDEXY XR) 50 MG CS24 sprinkle capsule Take 200 mg by mouth daily.  09/16/18  Yes [provider]  valACYclovir (VALTREX) 500 MG tablet Take 500 mg by mouth daily.   Yes [provider]  verapamil (CALAN-SR) 180 MG CR tablet Take 1 tablet (180 mg total) by mouth at bedtime. 12/16/18  Yes Adrian Prows, MD  cephALEXin (KEFLEX) 500 MG capsule Take 1 capsule (500 mg total) by mouth 4 (four) times daily. 03/11/19   Fransico Meadow, PA-C    Family History Family History  Adopted: Yes  Family history unknown: Yes    Social History Social History   Tobacco Use  . Smoking status: Never Smoker  . Smokeless tobacco: Never Used  Substance Use Topics  . Alcohol use: Yes    Comment: socially  . Drug use: No     Allergies   Nsaids   Review of Systems Review of Systems  Neurological: Positive for headaches.  All other systems reviewed and are negative.  Physical Exam Updated Vital Signs BP 136/80   Pulse (!) 104   Temp 99 F (37.2 C) (Oral)   Resp 16   LMP  (LMP Unknown)   SpO2 99%   Physical Exam Vitals signs reviewed.  HENT:     Head: Normocephalic and atraumatic.     Right Ear: Tympanic membrane normal.     Left Ear: Tympanic membrane normal.     Nose: Nose normal.     Mouth/Throat:     Mouth: Mucous membranes are moist.  Eyes:     Pupils: Pupils are equal, round, and reactive to light.  Neck:     Musculoskeletal: Normal range of motion.  Cardiovascular:     Rate and Rhythm: Normal rate.  Pulmonary:     Effort: Pulmonary effort is normal.  Abdominal:     General: Abdomen is flat.  Musculoskeletal: Normal range of motion.  Skin:    General: Skin is warm.  Neurological:     General: No focal deficit present.     Mental Status: She is alert.  Psychiatric:        Mood and  Affect: Mood normal.      ED Treatments / Results  Labs (all labs ordered are listed, but only abnormal results are displayed) Labs Reviewed  URINALYSIS, ROUTINE W REFLEX MICROSCOPIC - Abnormal; Notable for the following components:      Result Value   Glucose, UA >=500 (*)    Leukocytes,Ua SMALL (*)    Bacteria, UA RARE (*)    All other components within normal limits  COMPREHENSIVE METABOLIC PANEL - Abnormal; Notable for the following components:   CO2 20 (*)    All other components within normal limits  PREGNANCY, URINE  CBC WITH DIFFERENTIAL/PLATELET    EKG None  Radiology Ct Head Wo Contrast  Result Date: 03/11/2019 CLINICAL DATA:  Headache. EXAM: CT HEAD WITHOUT CONTRAST TECHNIQUE: Contiguous axial images were obtained from the base of the skull through the vertex without intravenous contrast. COMPARISON:  None. FINDINGS: Brain: No evidence of acute infarction, hemorrhage, hydrocephalus, extra-axial collection or mass lesion/mass effect. Vascular: No hyperdense vessel or unexpected calcification. Skull: Normal. Negative for fracture or focal lesion. Sinuses/Orbits: No acute finding. Other: None. IMPRESSION: Normal head CT. Electronically Signed   By: Lupita Raider M.D.   On: 03/11/2019 14:46    Procedures Procedures (including critical care time)  Medications Ordered in ED Medications - No data to display   Initial Impression / Assessment and Plan / ED Course  I have reviewed the triage vital signs and the nursing notes.  Pertinent labs & imaging results that were available during my care of the patient were reviewed by me and considered in my medical decision making (see chart for details).        MDM  Pt counseled on ct findings and urine.  Pt has had some discomfort with urination.  Pt given rx for keflex.  Pt advised to follow up with neurology for evaltuion of headaches   Final Clinical Impressions(s) / ED Diagnoses   Final diagnoses:  Urinary tract  infection without hematuria, site unspecified  Bad headache    ED Discharge Orders         Ordered    cephALEXin (KEFLEX) 500 MG capsule  4 times daily     03/11/19 1605        An After Visit Summary was printed and given to the patient.    Elson Areas, [place] 03/11/19  1621    Terrilee FilesButler, Michael C, MD 03/11/19 [phone]

## 2019-03-11 NOTE — ED Triage Notes (Signed)
Pt arrives from work with [employer] with c/o migraine that also caused her to have a episodes of stuttering that last about 45 minutes. Pt states she woke up with migraine which she has had before and a similar episode has happened with a previous migraine.  Pt states now her migraine has resolved and speaking clearly.

## 2019-03-11 NOTE — Discharge Instructions (Addendum)
See Dr. Ernie Hew for recheck.  Schedule to see neurology for evaluation

## 2019-03-11 NOTE — ED Notes (Signed)
Patient returned from Ct scan.  Still c/o headache bilateral temporal area.  Rates pain 5/10

## 2019-03-14 DIAGNOSIS — N809 Endometriosis, unspecified: Secondary | ICD-10-CM | POA: Diagnosis not present

## 2019-03-14 DIAGNOSIS — N390 Urinary tract infection, site not specified: Secondary | ICD-10-CM | POA: Diagnosis not present

## 2019-03-14 DIAGNOSIS — G43109 Migraine with aura, not intractable, without status migrainosus: Secondary | ICD-10-CM | POA: Diagnosis not present

## 2019-03-19 DIAGNOSIS — N809 Endometriosis, unspecified: Secondary | ICD-10-CM | POA: Diagnosis not present
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
## 2019-03-24 ENCOUNTER — Other Ambulatory Visit: Payer: Self-pay

## 2019-03-24 ENCOUNTER — Encounter: Payer: Self-pay | Admitting: Neurology

## 2019-03-24 ENCOUNTER — Telehealth (INDEPENDENT_AMBULATORY_CARE_PROVIDER_SITE_OTHER): Payer: BC Managed Care – PPO | Admitting: Neurology

## 2019-03-24 VITALS — Ht 62.0 in | Wt 115.0 lb

## 2019-03-24 DIAGNOSIS — F8081 Childhood onset fluency disorder: Secondary | ICD-10-CM

## 2019-03-24 DIAGNOSIS — R55 Syncope and collapse: Secondary | ICD-10-CM | POA: Diagnosis not present

## 2019-03-24 NOTE — Addendum Note (Signed)
Addended by: Ranae Plumber on: 03/24/2019 03:25 PM   Modules accepted: Orders

## 2019-03-25 ENCOUNTER — Telehealth: Payer: Self-pay

## 2019-03-25 NOTE — Telephone Encounter (Signed)
I saw the notes from Metta Clines, DO from neurology. Let him finish his evaluation, we could consider loop if no etiology found. You can message her this Lavone Nian

## 2019-03-25 NOTE — Telephone Encounter (Signed)
The Patient called and wanted you to know that she has had more episodes of syncope. This last time she was at work. She went to ED and is now being seen by Neurology.

## 2019-03-26 NOTE — Telephone Encounter (Signed)
S/w patient she will let us know after Neurology work up

## 2019-03-28 ENCOUNTER — Other Ambulatory Visit: Payer: Self-pay

## 2019-03-28 ENCOUNTER — Ambulatory Visit (INDEPENDENT_AMBULATORY_CARE_PROVIDER_SITE_OTHER): Payer: BC Managed Care – PPO | Admitting: Neurology

## 2019-03-28 DIAGNOSIS — R55 Syncope and collapse: Secondary | ICD-10-CM | POA: Diagnosis not present

## 2019-03-31 DIAGNOSIS — Z0000 Encounter for general adult medical examination without abnormal findings: Secondary | ICD-10-CM | POA: Diagnosis not present

## 2019-03-31 DIAGNOSIS — Z1159 Encounter for screening for other viral diseases: Secondary | ICD-10-CM | POA: Diagnosis not present

## 2019-03-31 DIAGNOSIS — E782 Mixed hyperlipidemia: Secondary | ICD-10-CM | POA: Diagnosis not present

## 2019-03-31 NOTE — Procedures (Signed)
SLEEP-DEPRIVED ELECTROENCEPHALOGRAM REPORT  Date of Study: 03/28/2019  Patient's Name: Pamela Booker MRN: [id number] Date of Birth: [birth year]  Referring Provider: Metta Clines, DO  Clinical History: 23 year old female with recurrent episodes of near-syncope, tunnel vision, palpitations, paresthesias and stuttering speech.  Medications: TYLENOL 325 MG tablet XANAX 0.25 MG tablet KEFLEX 500 MG capsule LEXAPRO 20 MG tablet AYGESTIN 5 MG tablet QUDEXY XR 50 MG CS24 sprinkle capsule VALTREX 500 MG tablet CALAN-SR 180 MG CR tablet  Technical Summary: A multichannel digital EEG recording measured by the international 10-20 system with electrodes applied with paste and impedances below 5000 ohms performed in our laboratory with EKG monitoring in an awake and asleep patient.  Hyperventilation was not performed as patient wearing mask for COVID-19.  Photic stimulation was performed.  The digital EEG was referentially recorded, reformatted, and digitally filtered in a variety of bipolar and referential montages for optimal display.    Description: The patient is awake and asleep during the recording.  During maximal wakefulness, there is a symmetric, medium voltage 12 Hz posterior dominant rhythm that attenuates with eye opening.  The record is symmetric.  During drowsiness and sleep, there is an increase in theta slowing of the background.  Vertex waves and symmetric sleep spindles were seen.  Photic stimulation did not elicit any abnormalities.  There were no epileptiform discharges or electrographic seizures seen.    EKG lead was unremarkable.  Impression: This awake and asleep EEG is normal.    Clinical Correlation: A normal EEG does not exclude a clinical diagnosis of epilepsy.  If further clinical questions remain, prolonged EEG may be helpful.  Clinical correlation is advised.   Metta Clines, DO

## 2019-04-01 ENCOUNTER — Telehealth: Payer: Self-pay

## 2019-04-01 NOTE — Telephone Encounter (Signed)
Called spoke with patient she was informed of results. 

## 2019-04-01 NOTE — Telephone Encounter (Signed)
-----   Message from Pieter Partridge, DO sent at 03/31/2019 12:44 PM EDT ----- EEG is normal

## 2019-04-03 DIAGNOSIS — Z118 Encounter for screening for other infectious and parasitic diseases: Secondary | ICD-10-CM | POA: Diagnosis not present

## 2019-04-03 DIAGNOSIS — Z23 Encounter for immunization: Secondary | ICD-10-CM | POA: Diagnosis not present

## 2019-04-03 DIAGNOSIS — Z124 Encounter for screening for malignant neoplasm of cervix: Secondary | ICD-10-CM | POA: Diagnosis not present

## 2019-04-03 DIAGNOSIS — Z113 Encounter for screening for infections with a predominantly sexual mode of transmission: Secondary | ICD-10-CM | POA: Diagnosis not present

## 2019-04-03 DIAGNOSIS — Z1151 Encounter for screening for human papillomavirus (HPV): Secondary | ICD-10-CM | POA: Diagnosis not present

## 2019-04-03 DIAGNOSIS — Z0000 Encounter for general adult medical examination without abnormal findings: Secondary | ICD-10-CM | POA: Diagnosis not present

## 2019-04-03 DIAGNOSIS — Z01419 Encounter for gynecological examination (general) (routine) without abnormal findings: Secondary | ICD-10-CM | POA: Diagnosis not present

## 2019-04-10 DIAGNOSIS — Z23 Encounter for immunization: Secondary | ICD-10-CM | POA: Diagnosis not present

## 2019-04-14 ENCOUNTER — Telehealth: Payer: Self-pay

## 2019-04-14 NOTE — Telephone Encounter (Signed)
I can see her tomorrow, I should be done in the hospital by 1 or 1:30

## 2019-04-14 NOTE — Telephone Encounter (Signed)
Pt called stating that she has her MRI of the brain scheduled for tomorrow but she feels as thou her lightheadedness/dizziness is worsening. Becoming more frequent. After exercising the other day, she became lightheaded, dizzy and began vomiting. This has not happened to her before after exercising. Has pending appt with you on 12/24 which pt states is supposed to be moved up. Pt is questioning if she should be seen. Please review and advise.//ah

## 2019-04-15 ENCOUNTER — Ambulatory Visit
Admission: RE | Admit: 2019-04-15 | Discharge: 2019-04-15 | Disposition: A | Discharge status: Home / Self Care | Payer: BC Managed Care – PPO | Source: Ambulatory Visit | Attending: Neurology | Admitting: Neurology

## 2019-04-15 ENCOUNTER — Other Ambulatory Visit: Payer: Self-pay

## 2019-04-15 ENCOUNTER — Telehealth: Payer: Self-pay

## 2019-04-15 DIAGNOSIS — R55 Syncope and collapse: Secondary | ICD-10-CM | POA: Diagnosis not present

## 2019-04-15 DIAGNOSIS — F8081 Childhood onset fluency disorder: Secondary | ICD-10-CM

## 2019-04-15 MED ORDER — GADOBENATE DIMEGLUMINE 529 MG/ML IV SOLN
10.0000 mL | Freq: Once | INTRAVENOUS | Status: AC | PRN
  Administered 2019-04-15: 10 mL via INTRAVENOUS

## 2019-04-15 NOTE — Telephone Encounter (Signed)
-----   Message from Pieter Partridge, DO sent at 04/15/2019 12:45 PM EST ----- MRI of brain is normal

## 2019-04-15 NOTE — Telephone Encounter (Signed)
Called spoke with patient she was informed of results. 

## 2019-04-16 ENCOUNTER — Encounter: Payer: Self-pay | Admitting: Cardiology

## 2019-04-16 ENCOUNTER — Ambulatory Visit (INDEPENDENT_AMBULATORY_CARE_PROVIDER_SITE_OTHER): Payer: BC Managed Care – PPO | Admitting: Cardiology

## 2019-04-16 VITALS — BP 154/86 | HR 108 | Temp 97.6°F | Ht 62.0 in | Wt 119.0 lb

## 2019-04-16 DIAGNOSIS — R42 Dizziness and giddiness: Secondary | ICD-10-CM

## 2019-04-16 DIAGNOSIS — R55 Syncope and collapse: Secondary | ICD-10-CM | POA: Diagnosis not present

## 2019-04-16 DIAGNOSIS — I493 Ventricular premature depolarization: Secondary | ICD-10-CM | POA: Diagnosis not present

## 2019-04-16 DIAGNOSIS — G43009 Migraine without aura, not intractable, without status migrainosus: Secondary | ICD-10-CM | POA: Diagnosis not present

## 2019-04-16 NOTE — Telephone Encounter (Signed)
Pt aware, call transferred to BG to schedule.//ah

## 2019-04-16 NOTE — Progress Notes (Signed)
Primary Physician/Referring:  Lewis Moccasin, MD  Patient ID: Pamela Booker, female    DOB: [birth year], 23 y.o.   MRN: [id number]  Chief Complaint  Patient presents with   Dizziness    f/u   Loss of Consciousness   Palpitations   HPI:    Pamela Booker  is a 23 y.o. Caucasian female  with history of migraines, recently evaluated by [place] for occasional PVC's and episode of questionable TIA vs. Complex migraine.  She has also had episodes of syncope and near syncope and symptoms started since March 2020. n 08/07/2018 of lightheadedness, near syncope, and numbness/tingling sensation in her face. Patient underwent echocardiogram and 48 hour holter monitor and is here to discuss results. Patient was found to have PVC's several years ago while pregnant; however, recently found to have frequent PVC's. Holter monitor revealed PVC burden of 6%. I had started her on Propanolol for her PVC's and also migraines.    No history of tobacco use or illicit drug use, drinks alcohol socially.  Patient called and wanted you to know that she has had more episodes of syncope. This last time she was at work. She went to ED, evaluation was uneventful and advised to f/u with me.  SAhe feels as thou her lightheadedness/dizziness is worsening. Becoming more frequent. After exercising two weeks ago, she became lightheaded, dizzy and began vomiting. This has not happened to her before after exercising.  She continues to have frequent episodes of neary syncope and palpitations and states she is not sure when the episoded occur. Also states she has had ED visits and neurology evaluation for speach difficulty and right arm weakness and tingling. MRI brain and EEG normal recenly. Wants to see if proceeding with loop is appropriate.   Past Medical History:  Diagnosis Date   Anxiety    Migraines    Palpitations    PVC (premature ventricular contraction)    PVC's (premature ventricular  contractions)    Past Surgical History:  Procedure Laterality Date   LAPAROSCOPY     TONSILLECTOMY     Social History   Socioeconomic History   Marital status: Divorced    Spouse name: Not on file   Number of children: 1   Years of education: Not on file   Highest education level: Some college, no degree  Occupational History   Not on file  Social Needs   Financial resource strain: Not on file   Food insecurity    Worry: Not on file    Inability: Not on file   Transportation needs    Medical: Not on file    Non-medical: Not on file  Tobacco Use   Smoking status: Never Smoker   Smokeless tobacco: Never Used  Substance and Sexual Activity   Alcohol use: Yes    Comment: socially   Drug use: No   Sexual activity: Not on file  Lifestyle   Physical activity    Days per week: Not on file    Minutes per session: Not on file   Stress: Not on file  Relationships   Social connections    Talks on phone: Not on file    Gets together: Not on file    Attends religious service: Not on file    Active member of club or organization: Not on file    Attends meetings of clubs or organizations: Not on file    Relationship status: Not on file   Intimate partner violence  Fear of current or ex partner: Not on file    Emotionally abused: Not on file    Physically abused: Not on file    Forced sexual activity: Not on file  Other Topics Concern   Not on file  Social History Narrative   Pt lives alone with daughter in 2 story home   Right handed   Pt drinks coffee everyday, sometime tea at night, soda-coke zero not often   exercise 3-4 times a week          ROS  Review of Systems  Constitution: Negative for decreased appetite, malaise/fatigue, weight gain and weight loss.  Eyes: Negative for visual disturbance.  Cardiovascular: Positive for chest pain and palpitations. Negative for claudication, dyspnea on exertion, leg swelling, orthopnea and syncope.    Respiratory: Negative for hemoptysis and wheezing.   Endocrine: Negative for cold intolerance and heat intolerance.  Hematologic/Lymphatic: Does not bruise/bleed easily.  Skin: Negative for nail changes.  Musculoskeletal: Negative for muscle weakness and myalgias.  Gastrointestinal: Negative for abdominal pain, change in bowel habit, nausea and vomiting.  Neurological: Positive for dizziness. Negative for difficulty with concentration, focal weakness and headaches.  Psychiatric/Behavioral: Negative for altered mental status, substance abuse and suicidal ideas.  All other systems reviewed and are negative.  Objective   Vitals with BMI 04/16/2019 04/16/2019 04/16/2019  Height - -   Weight - - 119 lbs  BMI - - 21.76  Systolic 154 147 161  Diastolic 86 92 88  Pulse 108 92 90   Physical Exam  Constitutional: No distress.  She is moderately build and well nourished in no acute distress.  HENT:  Head: Atraumatic.  Eyes: Conjunctivae are normal.  Neck: Neck supple. No thyromegaly present.  Cardiovascular: Normal rate, regular rhythm, normal heart sounds and intact distal pulses. Frequent extrasystoles are present. Exam reveals no gallop.  No murmur heard. No leg edema, no JVD.  Pulmonary/Chest: Effort normal and breath sounds normal.  Abdominal: Soft. Bowel sounds are normal.  Musculoskeletal: Normal range of motion.  Neurological: She is alert.  Skin: Skin is warm and dry.  Psychiatric: She has a normal mood and affect.    Laboratory examination:    Recent Labs    08/07/18 0947 03/11/19 1300  NA 137 140  K 4.0 3.9  CL 109 109  CO2 19* 20*  GLUCOSE 84 86  BUN 9 6  CREATININE 0.82 0.78  CALCIUM 9.7 9.4  GFRNONAA >60 >60  GFRAA >60 >60   CrCl cannot be calculated (Patient's most recent lab result is older than the maximum 21 days allowed.).  CMP Latest Ref Rng & Units 03/11/2019 08/07/2018 03/05/2013  Glucose 70 - 99 mg/dL 86 84 82  BUN 6 - 20 mg/dL Creatinine 0.44 - 1.00 mg/dL 0.96 0.45 4.09  Sodium 135 - 145 mmol/L 140 137 142  Potassium 3.5 - 5.1 mmol/L 3.9 4.0 3.6  Chloride 98 - 111 mmol/L 109 109 105  CO2 22 - 32 mmol/L 20(L) 19(L) 27  Calcium 8.9 - 10.3 mg/dL 9.4 9.7 9.5  Total Protein 6.5 - 8.1 g/dL 6.7 6.7 7.0  Total Bilirubin 0.3 - 1.2 mg/dL 0.7 0.6 0.3  Alkaline Phos 38 - 126 U/L 45 54 73  AST 15 - 41 U/L ALT 0 - 44 U/L 42 21 21   CBC Latest Ref Rng & Units 03/11/2019 08/07/2018 03/05/2013  WBC 4.0 - 10.5 K/uL 7.1 6.1 9.0  Hemoglobin  12.0 - 15.0 g/dL 62.114.4 30.814.4 65.714.0  Hematocrit 36.0 - 46.0 % 42.5 44.6 39.0  Platelets 150 - 400 K/uL 267 248 251   Lipid Panel  No results found for: CHOL, TRIG, HDL, CHOLHDL, VLDL, LDLCALC, LDLDIRECT HEMOGLOBIN A1C Lab Results  Component Value Date   HGBA1C  07/16/2010    5.1 (NOTE)                                                                       According to the ADA Clinical Practice Recommendations for 2011, when HbA1c is used as a screening test:   >=6.5%   Diagnostic of Diabetes Mellitus           (if abnormal result  is confirmed)  5.7-6.4%   Increased risk of developing Diabetes Mellitus  References:Diagnosis and Classification of Diabetes Mellitus,Diabetes Care,2011,34(Suppl 1):S62-S69 and Standards of Medical Care in         Diabetes - 2011,Diabetes Care,2011,34  (Suppl 1):S11-S61.   MPG 100 07/16/2010   TSH Recent Labs    08/07/18 0947  TSH 0.777   Medications and allergies   Allergies  Allergen Reactions   Nsaids Other (See Comments)    History of bleeding ulcers     Current Outpatient Medications  Medication Instructions   acetaminophen (TYLENOL) 650 mg, Every 6 hours PRN   ALPRAZolam (XANAX) 0.25 MG tablet 1 tablet, Oral, As needed   escitalopram (LEXAPRO) 20 mg, Oral, Daily   norethindrone (AYGESTIN) 5 mg, Oral, Daily   rizatriptan (MAXALT-MLT) 10 mg, Oral, As needed   topiramate ER (QUDEXY XR) 100 mg, Oral, Daily   valACYclovir (VALTREX)  500 mg, Daily   verapamil (CALAN-SR) 180 mg, Oral, Daily at bedtime    Radiology:  Mr Laqueta JeanBrain W Wo Contrast  Result Date: 04/15/2019 CLINICAL DATA:  Near syncopal episodes since May of this year. Tingling of the face and arms. Previous close head injury 2018. EXAM: MRI HEAD WITHOUT AND WITH CONTRAST TECHNIQUE: Multiplanar, multiecho pulse sequences of the brain and surrounding structures were obtained without and with intravenous contrast. CONTRAST:  10mL MULTIHANCE GADOBENATE DIMEGLUMINE 529 MG/ML IV SOLN COMPARISON:  Head CT 03/11/2019.  MRI 11/12/2014. FINDINGS: Brain: The brain has a normal appearance without evidence of malformation, atrophy, old or acute small or large vessel infarction, mass lesion, hemorrhage, hydrocephalus or extra-axial collection. After contrast administration, no abnormal enhancement occurs. Vascular: Major vessels at the base of the brain show flow. Venous sinuses appear patent. Skull and upper cervical spine: Normal. Sinuses/Orbits: Clear/normal. Other: None significant. IMPRESSION: Normal examination. No abnormality seen to explain the clinical presentation. No imaging sign of previous closed head injury. Electronically Signed   By: Paulina FusiMark  Shogry M.D.   On: 04/15/2019 09:02   Cardiac Studies:   48 hour holter monitor 10/02/2018: NSR. Occasional PVC's (6.2%). No SVT or A fib was noted.   Echocardiogram 10/30/2018: Normal LV systolic function with EF 55%. Left ventricle cavity is normal in size. Normal left ventricular wall thickness. Normal  global wall motion. Normal diastolic filling pattern. Calculated EF 55%. Left atrial cavity is normal in size. Intact interatrial septum. No shunting seen on agitated saline study.   Assessment     ICD-10-CM   1. Dizziness and giddiness  R42   2. Vasovagal syncope  R55   3. PVC (premature ventricular contraction)  I49.3 EKG 12-Lead  4. Migraine without aura and without status migrainosus, not intractable  G43.009     EKG  04/16/2019: Normal sinus rhythm at rate of 94 bpm, normal axis, IRBBB.  No evidence of ischemia, normal EKG. No significant change from  EKG 11/27/2018: Normal sinus rhythm at rate of 74 bpm.  Recommendations:  No orders of the defined types were placed in this encounter.   Patient last evaluated by me on 11/27/2022 palpitations, migraine headaches, dizziness and near syncope, symptoms improved with regard to palpitations on metoprolol.  However again since May and June 2020, she has had 2 episodes of near syncope but no frank syncope, etiology visits with palpitations, chest pain and also dysarthria. She is negative for orthostatic changes today.   I cannot pinpoint her symptoms, I discussed with her neurologist Dr. Metta Clines.  I requested him to evaluate her, I again called the patient back after discussions with Dr. Tomi Likens, patient in view of persistent symptoms, multiple event monitors revealing no significant "capture" of event as events did not hyppen and a sporadic and widespread, she would like to proceed with loop recorder implantation.  I'll await for Dr. Tomi Likens to evaluate her.  In the interim I have discussed extensively regarding loop recorder implantation. I have discussed skin disfiguration, infection, need for explantation with the patient and she is aware of the risk and wants to proceed. If after she sees Dr. Tomi Likens, will proceed if felt appropriate.  I have directly communicated with Dr. Tomi Likens.  "Total time spent with patient was  40 minutes and greater than 50% of that time was spent in face to face discussion, counseling and coordination care"    Adrian Prows, MD, [facility] 04/16/2019, 10:43 PM [facility] [place] Pager: [phone] Office: [phone] If no answer Cell [phone]

## 2019-04-17 ENCOUNTER — Telehealth: Payer: Self-pay | Admitting: Cardiology

## 2019-04-30 ENCOUNTER — Ambulatory Visit: Payer: BC Managed Care – PPO | Admitting: Cardiology

## 2019-04-30 DIAGNOSIS — I493 Ventricular premature depolarization: Secondary | ICD-10-CM | POA: Diagnosis not present

## 2019-04-30 DIAGNOSIS — Z3044 Encounter for surveillance of vaginal ring hormonal contraceptive device: Secondary | ICD-10-CM | POA: Diagnosis not present

## 2019-04-30 DIAGNOSIS — N809 Endometriosis, unspecified: Secondary | ICD-10-CM | POA: Diagnosis not present

## 2019-04-30 DIAGNOSIS — Z01818 Encounter for other preprocedural examination: Secondary | ICD-10-CM | POA: Diagnosis not present

## 2019-05-05 DIAGNOSIS — Z01812 Encounter for preprocedural laboratory examination: Secondary | ICD-10-CM | POA: Diagnosis not present

## 2019-05-05 DIAGNOSIS — Z20828 Contact with and (suspected) exposure to other viral communicable diseases: Secondary | ICD-10-CM | POA: Diagnosis not present

## 2019-05-05 DIAGNOSIS — N809 Endometriosis, unspecified: Secondary | ICD-10-CM | POA: Diagnosis not present

## 2019-05-06 ENCOUNTER — Ambulatory Visit (INDEPENDENT_AMBULATORY_CARE_PROVIDER_SITE_OTHER): Payer: BC Managed Care – PPO | Admitting: Cardiology

## 2019-05-06 ENCOUNTER — Other Ambulatory Visit: Payer: Self-pay

## 2019-05-06 ENCOUNTER — Encounter: Payer: Self-pay | Admitting: Cardiology

## 2019-05-06 VITALS — BP 136/68 | HR 80 | Temp 98.4°F | Ht 62.0 in | Wt 120.0 lb

## 2019-05-06 DIAGNOSIS — Z9889 Other specified postprocedural states: Secondary | ICD-10-CM

## 2019-05-06 DIAGNOSIS — R55 Syncope and collapse: Secondary | ICD-10-CM | POA: Diagnosis not present

## 2019-05-06 DIAGNOSIS — I493 Ventricular premature depolarization: Secondary | ICD-10-CM

## 2019-05-06 DIAGNOSIS — R42 Dizziness and giddiness: Secondary | ICD-10-CM

## 2019-05-06 DIAGNOSIS — Z9689 Presence of other specified functional implants: Secondary | ICD-10-CM | POA: Insufficient documentation

## 2019-05-06 HISTORY — DX: Other specified postprocedural states: Z98.890

## 2019-05-06 NOTE — Progress Notes (Signed)
Prodedure performed: Insertion of Biotronik Loop Recorder. Serial # [id number]  Indication: Syncope and lightheadedness.  Today's Vitals   05/06/19 1318 05/06/19 1403  BP: 123/77 136/68  Pulse: 60 80  Temp: 98.4 F (36.9 C)   SpO2: 99%     After obtaining informed consent, explaining the procedure to the patient, under sterile precautions, local anesthesia with 1% lidocaine with epinephrine was injected subcutaneously in the left parasternal region.  20 mL utilized.  A small nick was made in the left paraspinal region at the 5th intercostal space, 2 finger breaths away from the sternum being careful to overly encroach upon breast tissue, Biotronik loop recorder was inserted without any complications. The incision was sutured in place. Patient tolerated the procedure well.   There is no blood loss, no hematoma.  Written instrtuctions regarding wound care given to the patient.     ICD-10-CM   1. Syncope and collapse  R55   2. Dizziness and giddiness  R42   3. PVC (premature ventricular contraction)  I49.3     Vitals with BMI 05/06/2019 05/06/2019 04/16/2019  Height - - -  Weight - - -  BMI - - -  Systolic 536 644 034  Diastolic 68 77 86  Pulse 80 60 108   Programing: A. Fib: low HVR BPM 190 Bradycardia BPM 40 Sudden rate drop BPM 50 Asystole 4 Sec Patient trigger ON  Adrian Prows, MD, [facility] 05/06/2019, 2:29 PM [facility] [place] Pager: [phone] Office: [phone] If no answer Cell [phone]

## 2019-05-06 NOTE — Progress Notes (Deleted)
Prodedure performed: Insertion of Biotronik Loop Recorder. Serial # ***  Indication: ***  After obtaining informed consent, explaining the procedure to the patient, under sterile precautions, local anesthesia with 1% lidocaine with epinephrine was injected subcutaneously in the left parasternal region.  20 mL utilized.  A small nick was made in the left paraspinal region at the 5th intercostal space. Biotronik loop recorder was inserted without any complications.  Patient tolerated the procedure well.  The incision was closed with 2 sutures.  There is no blood loss, no hematoma.  Written instrtuctions regarding wound care given to the patient.

## 2019-05-09 DIAGNOSIS — R102 Pelvic and perineal pain: Secondary | ICD-10-CM | POA: Diagnosis not present

## 2019-05-09 DIAGNOSIS — N808 Other endometriosis: Secondary | ICD-10-CM | POA: Diagnosis not present

## 2019-05-09 DIAGNOSIS — Z9889 Other specified postprocedural states: Secondary | ICD-10-CM | POA: Diagnosis not present

## 2019-05-09 DIAGNOSIS — N803 Endometriosis of pelvic peritoneum: Secondary | ICD-10-CM | POA: Diagnosis not present

## 2019-05-09 DIAGNOSIS — N809 Endometriosis, unspecified: Secondary | ICD-10-CM | POA: Diagnosis not present

## 2019-05-09 DIAGNOSIS — I493 Ventricular premature depolarization: Secondary | ICD-10-CM | POA: Diagnosis not present

## 2019-05-12 DIAGNOSIS — F411 Generalized anxiety disorder: Secondary | ICD-10-CM | POA: Diagnosis not present

## 2019-05-12 DIAGNOSIS — G4700 Insomnia, unspecified: Secondary | ICD-10-CM | POA: Diagnosis not present

## 2019-05-12 DIAGNOSIS — G43109 Migraine with aura, not intractable, without status migrainosus: Secondary | ICD-10-CM | POA: Diagnosis not present
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
## 2019-05-15 ENCOUNTER — Telehealth: Payer: BC Managed Care – PPO | Admitting: Neurology

## 2019-05-15 ENCOUNTER — Other Ambulatory Visit: Payer: Self-pay

## 2019-05-15 ENCOUNTER — Ambulatory Visit: Payer: BC Managed Care – PPO | Admitting: Cardiology

## 2019-05-15 DIAGNOSIS — Z95818 Presence of other cardiac implants and grafts: Secondary | ICD-10-CM

## 2019-05-15 NOTE — Progress Notes (Signed)
Patient here for wound check only. 2 sutures removed without complication. Site is healing well. She denies any problems, except mild tenderness. No drainage or evidence of infection. Will continue with remote monitoring and we will see her back in 1 year for follow up

## 2019-05-29 ENCOUNTER — Ambulatory Visit: Payer: BC Managed Care – PPO | Admitting: Cardiology

## 2019-06-03 ENCOUNTER — Ambulatory Visit: Payer: BC Managed Care – PPO | Admitting: Cardiology

## 2019-06-06 HISTORY — PX: LOOP RECORDER REMOVAL: EP1215

## 2019-06-07 DIAGNOSIS — R55 Syncope and collapse: Secondary | ICD-10-CM

## 2019-06-07 DIAGNOSIS — Z4509 Encounter for adjustment and management of other cardiac device: Secondary | ICD-10-CM

## 2019-06-07 DIAGNOSIS — Z95818 Presence of other cardiac implants and grafts: Secondary | ICD-10-CM

## 2019-06-10 ENCOUNTER — Encounter: Payer: Self-pay | Admitting: Cardiology

## 2019-06-10 DIAGNOSIS — Z4509 Encounter for adjustment and management of other cardiac device: Secondary | ICD-10-CM | POA: Insufficient documentation

## 2019-06-10 DIAGNOSIS — R55 Syncope and collapse: Secondary | ICD-10-CM

## 2019-06-10 HISTORY — DX: Syncope and collapse: R55

## 2019-06-10 HISTORY — DX: Encounter for adjustment and management of other cardiac device: Z45.09

## 2019-06-11 ENCOUNTER — Ambulatory Visit: Payer: BC Managed Care – PPO | Admitting: Cardiology
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
## 2019-06-26 ENCOUNTER — Ambulatory Visit: Payer: Self-pay | Admitting: Neurology

## 2019-07-08 DIAGNOSIS — I10 Essential (primary) hypertension: Secondary | ICD-10-CM | POA: Diagnosis not present

## 2019-07-08 DIAGNOSIS — Z4509 Encounter for adjustment and management of other cardiac device: Secondary | ICD-10-CM | POA: Diagnosis not present

## 2019-07-08 DIAGNOSIS — G43109 Migraine with aura, not intractable, without status migrainosus: Secondary | ICD-10-CM | POA: Diagnosis not present

## 2019-07-08 DIAGNOSIS — F411 Generalized anxiety disorder: Secondary | ICD-10-CM | POA: Diagnosis not present

## 2019-07-08 DIAGNOSIS — Z95818 Presence of other cardiac implants and grafts: Secondary | ICD-10-CM | POA: Diagnosis not present

## 2019-07-08 DIAGNOSIS — R55 Syncope and collapse: Secondary | ICD-10-CM | POA: Diagnosis not present

## 2019-07-08 DIAGNOSIS — R002 Palpitations: Secondary | ICD-10-CM | POA: Diagnosis not present

## 2019-07-09 ENCOUNTER — Telehealth: Payer: Self-pay
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
## 2019-07-10 NOTE — Telephone Encounter (Signed)
Scheduled Remote loop recorder check 07/08/2019: This represents an unremarkable monitoring session. There were 6 symptomatic patient activations recorded (chest pain, palpitations and tingling). A review of the episode detail shows this was consistent with sinus rhythm and rare PVCs.  There are no new alerts to explain her symptoms and will continue to monitor, if symptoms persisting, will see in the office.  Yates Decamp, MD, [facility] 07/10/2019, 10:07 PM [facility]. [place] Office: [phone]

## 2019-07-10 NOTE — Telephone Encounter (Signed)
Please see above and let her know that the loop recorder is not showing abnormal.  I have sent her web message with this

## 2019-07-11 DIAGNOSIS — Z03818 Encounter for observation for suspected exposure to other biological agents ruled out: Secondary | ICD-10-CM | POA: Diagnosis not present

## 2019-07-11 DIAGNOSIS — Z20828 Contact with and (suspected) exposure to other viral communicable diseases: Secondary | ICD-10-CM | POA: Diagnosis not present

## 2019-07-11 NOTE — Telephone Encounter (Signed)
From patient. She said will keep you posted about the results.

## 2019-07-14 ENCOUNTER — Encounter: Payer: Self-pay | Admitting: Cardiology

## 2019-07-14 ENCOUNTER — Other Ambulatory Visit: Payer: Self-pay

## 2019-07-14 ENCOUNTER — Ambulatory Visit (INDEPENDENT_AMBULATORY_CARE_PROVIDER_SITE_OTHER): Payer: BC Managed Care – PPO | Admitting: Cardiology

## 2019-07-14 VITALS — BP 134/87 | HR 89 | Temp 98.8°F | Ht 62.0 in | Wt 120.0 lb

## 2019-07-14 DIAGNOSIS — R0789 Other chest pain: Secondary | ICD-10-CM

## 2019-07-14 DIAGNOSIS — F4310 Post-traumatic stress disorder, unspecified: Secondary | ICD-10-CM | POA: Diagnosis not present

## 2019-07-14 MED ORDER — TRAMADOL HCL 50 MG PO TABS: 50.0000 mg | ORAL_TABLET | Freq: Four times a day (QID) | ORAL | 0 refills | Status: DC | PRN

## 2019-07-14 NOTE — Progress Notes (Signed)
Primary Physician/Referring:  Lewis Moccasin, MD  Patient ID: Pamela Booker, female    DOB: [birth year], 24 y.o.   MRN: [id number]  Chief Complaint  Patient presents with  . Chest Pain  . Follow-up   HPI:    Pamela Booker  is a 23 y.o.  Caucasian female patient with chronic migraines, occasional PVCs and palpitations, episodes of questionable TIAs versus complex migraine, episodes of syncope and near syncope since March 2020 with extensive evaluation from neurology, has Biotronik loop recorder implantation performed on 05/06/2019, called me yesterday for frequent episodes of chest pain that started about 3 to 4 days ago and associated with dyspnea.  I brought him to be evaluated further.  Chest pain described as sharp pain in the left upper part of the chest.  Is been constant.  She has not had any further episodes of syncope or near syncope.  Past Medical History:  Diagnosis Date  . Anxiety   . Encounter for loop recorder check 06/10/2019  . Loop: Biotronic loop recorder in situ 05/06/2019   Scheduled Remote loop recorder check 06/09/2019: This represents an unremarkable monitoring session. The presenting rhythm is not available. No symptomatic or automatic episodes were triggered. Daily activity trends are stable. Heart rate variability trends are stable.  . Migraines   . Palpitations   . PVC (premature ventricular contraction)   . PVC's (premature ventricular contractions)   . Syncope and collapse 06/10/2019   Past Surgical History:  Procedure Laterality Date  . LAPAROSCOPY    . TONSILLECTOMY     Social History   Tobacco Use  . Smoking status: Never Smoker  . Smokeless tobacco: Never Used  Substance Use Topics  . Alcohol use: Yes    Comment: socially    ROS  Review of Systems  Cardiovascular: Positive for chest pain and palpitations.  Psychiatric/Behavioral: The patient is nervous/anxious.    Objective  Blood pressure 134/87, pulse 89,  temperature 98.8 F (37.1 C), height 5\' 2"  (1.575 m), weight 120 lb (54.4 kg), SpO2 99 %, unknown if currently breastfeeding.  Vitals with BMI 07/14/2019 05/06/2019 05/06/2019  Height 5\' 2"  5\' 2"  -  Weight 120 lbs 120 lbs -  BMI 21.94 21.94 -  Systolic 134 136 14/06/2018  Diastolic 87 68 77  Pulse 89 80 60     Physical Exam  Neck: No thyromegaly present.  Cardiovascular: Normal rate, regular rhythm, normal heart sounds and intact distal pulses. Exam reveals no gallop.  No murmur heard. No leg edema, no JVD.  Pulmonary/Chest: Effort normal and breath sounds normal. She exhibits tenderness (left costchondral area).  Abdominal: Soft. Bowel sounds are normal.  Musculoskeletal:     Cervical back: Neck supple.  Skin: Skin is warm and dry.   Laboratory examination:   Recent Labs    08/07/18 0947 03/11/19 1300  NA 137 140  K 4.0 3.9  CL 109 109  CO2 19* 20*  GLUCOSE 84 86  BUN 9 6  CREATININE 0.82 0.78  CALCIUM 9.7 9.4  GFRNONAA >60 >60  GFRAA >60 >60   CrCl cannot be calculated (Patient's most recent lab result is older than the maximum 21 days allowed.).  CMP Latest Ref Rng & Units 03/11/2019 08/07/2018 03/05/2013  Glucose 70 - 99 mg/dL 86 84 82  BUN 6 - 20 mg/dL 6 9 15   Creatinine 0.44 - 1.00 mg/dL 05/11/2019 10/07/2018 05/05/2013  Sodium 135 - 145 mmol/L 140 137 142  Potassium 3.5 - 5.1 mmol/L 3.9  4.0 3.6  Chloride 98 - 111 mmol/L 109 109 105  CO2 22 - 32 mmol/L 20(L) 19(L) 27  Calcium 8.9 - 10.3 mg/dL 9.4 9.7 9.5  Total Protein 6.5 - 8.1 g/dL 6.7 6.7 7.0  Total Bilirubin 0.3 - 1.2 mg/dL 0.7 0.6 0.3  Alkaline Phos 38 - 126 U/L 45 54 73  AST 15 - 41 U/L 23 20 20   ALT 0 - 44 U/L 42 21 21   CBC Latest Ref Rng & Units 03/11/2019 08/07/2018 03/05/2013  WBC 4.0 - 10.5 K/uL 7.1 6.1 9.0  Hemoglobin 12.0 - 15.0 g/dL 05/05/2013 31.5 40.0  Hematocrit 36.0 - 46.0 % 42.5 44.6 39.0  Platelets 150 - 400 K/uL 267 248 251   Lipid Panel  No results found for: CHOL, TRIG, HDL, CHOLHDL, VLDL, LDLCALC,  LDLDIRECT HEMOGLOBIN A1C Lab Results  Component Value Date   HGBA1C  07/16/2010    5.1 (NOTE)                                                                       According to the ADA Clinical Practice Recommendations for 2011, when HbA1c is used as a screening test:   >=6.5%   Diagnostic of Diabetes Mellitus           (if abnormal result  is confirmed)  5.7-6.4%   Increased risk of developing Diabetes Mellitus  References:Diagnosis and Classification of Diabetes Mellitus,Diabetes Care,2011,34(Suppl 1):S62-S69 and Standards of Medical Care in         Diabetes - 2011,Diabetes Care,2011,34  (Suppl 1):S11-S61.   MPG 100 07/16/2010   TSH Recent Labs    08/07/18 0947  TSH 0.777   Medications and allergies   Allergies  Allergen Reactions  . Nsaids Other (See Comments)    History of bleeding ulcers     Current Outpatient Medications  Medication Instructions  . acetaminophen (TYLENOL) 650 mg, Every 6 hours PRN  . ALPRAZolam (XANAX) 0.25 MG tablet 1 tablet, Oral, As needed  . escitalopram (LEXAPRO) 20 mg, Oral, Daily  . norethindrone (AYGESTIN) 5 mg, Oral, Daily  . rizatriptan (MAXALT-MLT) 10 mg, Oral, As needed  . topiramate ER (QUDEXY XR) 100 mg, Oral, Daily  . traMADol (ULTRAM) 50 mg, Oral, Every 6 hours PRN  . valACYclovir (VALTREX) 500 mg, Daily  . verapamil (CALAN-SR) 180 mg, Oral, Daily at bedtime    Radiology:  Mr 10/07/18 Wo Contrast  Result Date: 04/15/2019 CLINICAL DATA:  Near syncopal episodes since May of this year. Tingling of the face and arms. Previous close head injury 2018. EXAM: MRI HEAD WITHOUT AND WITH CONTRAST TECHNIQUE: Multiplanar, multiecho pulse sequences of the brain and surrounding structures were obtained without and with intravenous contrast. CONTRAST:  77mL MULTIHANCE GADOBENATE DIMEGLUMINE 529 MG/ML IV SOLN COMPARISON:  Head CT 03/11/2019.  MRI 11/12/2014. FINDINGS: Brain: The brain has a normal appearance without evidence of malformation, atrophy, old  or acute small or large vessel infarction, mass lesion, hemorrhage, hydrocephalus or extra-axial collection. After contrast administration, no abnormal enhancement occurs. Vascular: Major vessels at the base of the brain show flow. Venous sinuses appear patent. Skull and upper cervical spine: Normal. Sinuses/Orbits: Clear/normal. Other: None significant. IMPRESSION: Normal examination. No abnormality seen to explain the clinical presentation.  No imaging sign of previous closed head injury. Electronically Signed   By: Nelson Chimes M.D.   On: 04/15/2019 09:02   Cardiac Studies:   48 hour holter monitor 10/02/2018: NSR. Occasional PVC's (6.2%). No SVT or A fib was noted.   Echocardiogram 10/30/2018: Normal LV systolic function with EF 55%. Left ventricle cavity is normal in size. Normal left ventricular wall thickness. Normal  global wall motion. Normal diastolic filling pattern. Calculated EF 55%. Left atrial cavity is normal in size. Intact interatrial septum. No shunting seen on agitated saline study.   Assessment     ICD-10-CM   1. Chest pain, musculoskeletal  R07.89 EKG 12-Lead    traMADol (ULTRAM) 50 MG tablet    EKG 07/14/2019: Normal sinus rhythm with rate of 85 bpm, normal axis.  Incomplete right bundle block.  Single PVC.  Normal exam.    Scheduled Remote loop recorder check 07/08/2019: This represents an unremarkable monitoring session. There were 6 symptomatic patient activations recorded (chest pain, palpitations and tingling). A review of the episode detail shows this was consistent with sinus rhythm and rare PVCs.  Meds ordered this encounter  Medications  . traMADol (ULTRAM) 50 MG tablet    Sig: Take 1 tablet (50 mg total) by mouth every 6 (six) hours as needed.    Dispense:  30 tablet    Refill:  0    There are no discontinued medications.   Recommendations:   Pamela Booker  is a 24 y.o.  Caucasian female patient with chronic migraines, occasional PVCs and  palpitations, episodes of questionable TIAs versus complex migraine, episodes of syncope and near syncope since March 2020 with extensive evaluation from neurology, has Biotronik loop recorder implantation performed on 05/06/2019, called me yesterday for frequent episodes of chest pain that started about 3 to 4 days ago and associated with dyspnea.  I brought him to be evaluated further.  Chest pain clearly musculoskeletal and easily reproducible.  I reassured her.  Dyspnea is related to inability to take deep breaths.  Do not suspect ACS or pulmonary embolism.  She is on estrogen replacement therapy but do not think that she has any clinical evidence of DVT.  I prescribed her Ultram to be used on a as needed basis.  Reasons for reproducible pain explained to the patient. Heat to tender area. Cold ice compressions to reduce inflammation. Recommended Aleve OTC 1-2 tablets BID for 3-4 days and PRN. Reassured the patient.    Adrian Prows, MD, [facility] 07/14/2019, 10:50 AM [facility]. [place] Office: [phone]

## 2019-07-17 ENCOUNTER — Ambulatory Visit: Payer: BC Managed Care – PPO | Admitting: Neurology

## 2019-07-17 DIAGNOSIS — R079 Chest pain, unspecified: Secondary | ICD-10-CM | POA: Diagnosis not present

## 2019-07-25 DIAGNOSIS — R079 Chest pain, unspecified: Secondary | ICD-10-CM | POA: Diagnosis not present

## 2019-07-31 ENCOUNTER — Telehealth: Payer: Self-pay | Admitting: Neurology

## 2019-08-08 DIAGNOSIS — Z95818 Presence of other cardiac implants and grafts: Secondary | ICD-10-CM | POA: Diagnosis not present

## 2019-08-08 DIAGNOSIS — F4310 Post-traumatic stress disorder, unspecified: Secondary | ICD-10-CM | POA: Diagnosis not present

## 2019-08-08 DIAGNOSIS — R55 Syncope and collapse: Secondary | ICD-10-CM | POA: Diagnosis not present

## 2019-08-08 DIAGNOSIS — Z4509 Encounter for adjustment and management of other cardiac device: Secondary | ICD-10-CM | POA: Diagnosis not present

## 2019-08-11 DIAGNOSIS — M542 Cervicalgia: Secondary | ICD-10-CM | POA: Diagnosis not present

## 2019-08-13 DIAGNOSIS — R079 Chest pain, unspecified: Secondary | ICD-10-CM | POA: Diagnosis not present

## 2019-08-14 DIAGNOSIS — G4700 Insomnia, unspecified: Secondary | ICD-10-CM | POA: Diagnosis not present

## 2019-08-14 DIAGNOSIS — F411 Generalized anxiety disorder: Secondary | ICD-10-CM | POA: Diagnosis not present

## 2019-08-14 DIAGNOSIS — R002 Palpitations: Secondary | ICD-10-CM | POA: Diagnosis not present

## 2019-08-21 ENCOUNTER — Encounter (HOSPITAL_COMMUNITY): Payer: Self-pay

## 2019-08-21 ENCOUNTER — Other Ambulatory Visit: Payer: Self-pay

## 2019-08-21 ENCOUNTER — Emergency Department (HOSPITAL_COMMUNITY)
Admission: EM | Admit: 2019-08-21 | Discharge: 2019-08-21 | Disposition: A | Discharge status: Home / Self Care | Payer: BC Managed Care – PPO | Attending: Emergency Medicine | Admitting: Emergency Medicine

## 2019-08-21 DIAGNOSIS — Z5321 Procedure and treatment not carried out due to patient leaving prior to being seen by health care provider: Secondary | ICD-10-CM | POA: Insufficient documentation

## 2019-08-21 DIAGNOSIS — R000 Tachycardia, unspecified: Secondary | ICD-10-CM | POA: Diagnosis not present

## 2019-08-21 DIAGNOSIS — R404 Transient alteration of awareness: Secondary | ICD-10-CM | POA: Diagnosis not present

## 2019-08-21 DIAGNOSIS — R569 Unspecified convulsions: Secondary | ICD-10-CM | POA: Insufficient documentation

## 2019-08-21 DIAGNOSIS — I10 Essential (primary) hypertension: Secondary | ICD-10-CM | POA: Diagnosis not present

## 2019-08-21 HISTORY — DX: Essential (primary) hypertension: I10

## 2019-08-21 LAB — I-STAT BETA HCG BLOOD, ED (MC, WL, AP ONLY): I-stat hCG, quantitative: <5 m[IU]/mL (ref <5)

## 2019-08-21 NOTE — ED Triage Notes (Signed)
Pt bib g[facility] from home w/ c/o seizure like activity. Per EMS pt had full upper body tremors. Pt states episode lasted approx 15-20 mins. Pt is completely aware of episode, remembers it happening, is able to hear and understand when people speak to her during these episodes, however, pt is unable to speak during this time. Pt states this has happened in the past, she sees a neurologist for this issue and recently had a loop monitor placed to see if this issue is cardiac related.

## 2019-08-21 NOTE — ED Notes (Signed)
NT removed pt's IV from EMS. Pt states she wants to leave. Pt states she has been in touch with her neurologist and cardiologist and will follow up with them. RN advised against leaving. Pt decided to still leave.

## 2019-08-22 ENCOUNTER — Other Ambulatory Visit: Payer: Self-pay

## 2019-08-22 DIAGNOSIS — R569 Unspecified convulsions: Secondary | ICD-10-CM

## 2019-08-22 NOTE — Progress Notes (Signed)
EEG order placed

## 2019-09-01 ENCOUNTER — Ambulatory Visit (INDEPENDENT_AMBULATORY_CARE_PROVIDER_SITE_OTHER): Payer: BC Managed Care – PPO | Admitting: Neurology

## 2019-09-01 ENCOUNTER — Other Ambulatory Visit: Payer: Self-pay

## 2019-09-01 DIAGNOSIS — R569 Unspecified convulsions: Secondary | ICD-10-CM

## 2019-09-02 DIAGNOSIS — F4310 Post-traumatic stress disorder, unspecified: Secondary | ICD-10-CM | POA: Diagnosis not present

## 2019-09-09 DIAGNOSIS — Z95818 Presence of other cardiac implants and grafts: Secondary | ICD-10-CM | POA: Diagnosis not present

## 2019-09-09 DIAGNOSIS — R55 Syncope and collapse: Secondary | ICD-10-CM | POA: Diagnosis not present

## 2019-09-09 DIAGNOSIS — Z4509 Encounter for adjustment and management of other cardiac device: Secondary | ICD-10-CM | POA: Diagnosis not present

## 2019-09-09 NOTE — Procedures (Signed)
ELECTROENCEPHALOGRAM REPORT  Dates of Recording: 09/01/2019 at 13:04 to 09/04/2019 at 13:52.  Patient's Name: Pamela Booker MRN: [id number] Date of Birth: 22-Nov-1995  Procedure: 72-hour ambulatory EEG  History: 24 year old female with recurrent episodes of syncope and seizure-like activity.  Medications:  Xanax Qudexy XR Lexapro Calan-SR Aygestin  Technical Summary: This is a 72-hour multichannel digital EEG recording measured by the international 10-20 system with electrodes applied with paste and impedances below 5000 ohms performed as portable with EKG monitoring.  The digital EEG was referentially recorded, reformatted, and digitally filtered in a variety of bipolar and referential montages for optimal display.    DESCRIPTION OF RECORDING: During maximal wakefulness, the background activity consisted of a symmetric 9 Hz posterior dominant rhythm which was reactive to eye opening.  There were no epileptiform discharges or focal slowing seen in wakefulness.  During the recording, the patient progresses through wakefulness, drowsiness, and Stage 2 sleep.  Again, there were no epileptiform discharges seen.  Recording had stopped between 09:06 AM to 12:46 PM on 09/04/2019.  Events:  Patient had one push-button event on 09/04/2019 at 14:20, which she reports was a seizure.  She describes in her diary that she "started feeling funny, warm, dizzy, head felt heavy, body feels heavy, face tingles, arms feel heavy".  Video was not recording at that time.  There were no electrographic seizures seen.  EKG lead was unremarkable.  IMPRESSION: This 72-hour ambulatory EEG study is normal.    CLINICAL CORRELATION: The push-button event was not consistent with an epileptic seizure.   Shon Millet, DO

## 2019-09-10 ENCOUNTER — Telehealth: Payer: Self-pay

## 2019-09-10 NOTE — Telephone Encounter (Signed)
Telephone call to pt, pt advised of her EEG results. Pt wants to knows if she could be added to [facility]'s cancellation list. Her appt scheduled for 12/2019,  Advised pt I will ask.

## 2019-09-10 NOTE — Telephone Encounter (Signed)
-----   Message from Drema Dallas, DO sent at 09/10/2019 12:14 PM EDT ----- EEG is normal.  She reports that she had a seizure-like event and there was no evidence of seizure activity on the EEG.  Therefore, it is not seizure.  She would need to follow up to discuss further

## 2019-09-11 DIAGNOSIS — H5213 Myopia, bilateral: Secondary | ICD-10-CM | POA: Diagnosis not present

## 2019-09-11 DIAGNOSIS — Z135 Encounter for screening for eye and ear disorders: Secondary | ICD-10-CM | POA: Diagnosis not present

## 2019-09-11 DIAGNOSIS — H52203 Unspecified astigmatism, bilateral: Secondary | ICD-10-CM | POA: Diagnosis not present

## 2019-09-24 DIAGNOSIS — G4700 Insomnia, unspecified: Secondary | ICD-10-CM | POA: Diagnosis not present

## 2019-09-24 DIAGNOSIS — Z62898 Other specified problems related to upbringing: Secondary | ICD-10-CM | POA: Diagnosis not present

## 2019-09-24 DIAGNOSIS — F411 Generalized anxiety disorder: Secondary | ICD-10-CM | POA: Diagnosis not present

## 2019-10-02 DIAGNOSIS — G43109 Migraine with aura, not intractable, without status migrainosus: Secondary | ICD-10-CM | POA: Diagnosis not present

## 2019-10-07 DIAGNOSIS — Z23 Encounter for immunization: Secondary | ICD-10-CM | POA: Diagnosis not present

## 2019-10-10 DIAGNOSIS — F410 Panic disorder [episodic paroxysmal anxiety] without agoraphobia: Secondary | ICD-10-CM | POA: Diagnosis not present

## 2019-10-10 DIAGNOSIS — G43109 Migraine with aura, not intractable, without status migrainosus: Secondary | ICD-10-CM | POA: Diagnosis not present

## 2019-10-10 DIAGNOSIS — G4700 Insomnia, unspecified: Secondary | ICD-10-CM | POA: Diagnosis not present

## 2019-10-10 DIAGNOSIS — F411 Generalized anxiety disorder: Secondary | ICD-10-CM | POA: Diagnosis not present

## 2019-10-30 DIAGNOSIS — Z315 Encounter for genetic counseling: Secondary | ICD-10-CM | POA: Diagnosis not present

## 2019-10-30 DIAGNOSIS — Z1501 Genetic susceptibility to malignant neoplasm of breast: Secondary | ICD-10-CM | POA: Diagnosis not present

## 2019-11-03 ENCOUNTER — Other Ambulatory Visit: Payer: Self-pay

## 2019-11-03 ENCOUNTER — Encounter (HOSPITAL_COMMUNITY): Payer: Self-pay

## 2019-11-03 ENCOUNTER — Ambulatory Visit (HOSPITAL_COMMUNITY)
Admission: EM | Admit: 2019-11-03 | Discharge: 2019-11-03 | Disposition: A | Discharge status: Home / Self Care | Payer: BC Managed Care – PPO | Attending: Physician Assistant | Admitting: Physician Assistant

## 2019-11-03 DIAGNOSIS — M7918 Myalgia, other site: Secondary | ICD-10-CM | POA: Diagnosis not present

## 2019-11-03 DIAGNOSIS — W19XXXA Unspecified fall, initial encounter: Secondary | ICD-10-CM | POA: Diagnosis not present

## 2019-11-03 DIAGNOSIS — S300XXA Contusion of lower back and pelvis, initial encounter: Secondary | ICD-10-CM

## 2019-11-03 MED ORDER — KETOROLAC TROMETHAMINE 30 MG/ML IJ SOLN
INTRAMUSCULAR | Status: AC
  Filled 2019-11-03: qty 1

## 2019-11-03 MED ORDER — CELECOXIB 50 MG PO CAPS: 50.0000 mg | ORAL_CAPSULE | Freq: Two times a day (BID) | ORAL | 0 refills | Status: AC

## 2019-11-03 MED ORDER — ACETAMINOPHEN 500 MG PO TABS: 1000.0000 mg | ORAL_TABLET | Freq: Three times a day (TID) | ORAL | 0 refills | Status: DC | PRN

## 2019-11-03 MED ORDER — KETOROLAC TROMETHAMINE 30 MG/ML IJ SOLN
30.0000 mg | Freq: Once | INTRAMUSCULAR | Status: AC
  Administered 2019-11-03: 30 mg via INTRAMUSCULAR

## 2019-11-03 MED ORDER — OMEPRAZOLE 20 MG PO CPDR: 20.0000 mg | DELAYED_RELEASE_CAPSULE | Freq: Every day | ORAL | 0 refills | Status: DC

## 2019-11-03 NOTE — Discharge Instructions (Signed)
I want you to do a short course of Celebrex, while taking this take the Prilosec daily Take 2 extra strength Tylenol every 8 hours  Apply ice or heat as preferred Warm soaks and use the stretching exercises attached.  Minimize prolonged inactivity.  Use a pillow to support between your legs at night.  Please schedule follow-up with your primary care as needed if not improving with these initial treatments.  If acutely worsening over the next few days please return for reevaluation.
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
## 2019-11-19 DIAGNOSIS — Z3141 Encounter for fertility testing: Secondary | ICD-10-CM | POA: Diagnosis not present

## 2019-12-09 DIAGNOSIS — R55 Syncope and collapse: Secondary | ICD-10-CM | POA: Diagnosis not present

## 2019-12-09 DIAGNOSIS — Z4509 Encounter for adjustment and management of other cardiac device: Secondary | ICD-10-CM | POA: Diagnosis not present

## 2019-12-09 DIAGNOSIS — Z95818 Presence of other cardiac implants and grafts: Secondary | ICD-10-CM | POA: Diagnosis not present

## 2019-12-09 NOTE — Telephone Encounter (Signed)
From pt

## 2019-12-09 NOTE — Telephone Encounter (Signed)
Can you please schedule for a loop explant in the office. Needs 30 min total.   JG

## 2019-12-09 NOTE — Telephone Encounter (Signed)
From pt please read

## 2019-12-10 NOTE — Telephone Encounter (Signed)
Loop schedule please

## 2019-12-12 ENCOUNTER — Other Ambulatory Visit: Payer: Self-pay

## 2019-12-12 ENCOUNTER — Ambulatory Visit: Payer: BC Managed Care – PPO | Admitting: Cardiology

## 2019-12-12 VITALS — BP 114/71 | HR 71 | Resp 17 | Ht 62.0 in | Wt 123.4 lb

## 2019-12-12 DIAGNOSIS — Z9889 Other specified postprocedural states: Secondary | ICD-10-CM

## 2019-12-12 DIAGNOSIS — Z792 Long term (current) use of antibiotics: Secondary | ICD-10-CM

## 2019-12-12 DIAGNOSIS — R55 Syncope and collapse: Secondary | ICD-10-CM

## 2019-12-12 DIAGNOSIS — G8918 Other acute postprocedural pain: Secondary | ICD-10-CM

## 2019-12-12 DIAGNOSIS — Z4509 Encounter for adjustment and management of other cardiac device: Secondary | ICD-10-CM

## 2019-12-12 MED ORDER — AMOXICILLIN 500 MG PO TABS: 2000.0000 mg | ORAL_TABLET | Freq: Once | ORAL | 0 refills | Status: AC

## 2019-12-12 NOTE — Progress Notes (Signed)
Procedure performed: Explantation of loop recorder.  Patient with history of loop recorder implantation for recurrent episodes of syncope, she has now been diagnosed with complex migraines, has no further indication for monitoring of the loop, as per patient preference, she was brought in for loop recorder explantation.  Risks and the risk were discussed with the patient including infection, hematoma, scarring of the tissue, but not limited to this.  She is willing to proceed.  Under sterile precautions, I made a small half an inch neck at the left parasternal region just below the medial aspect of her left breast, tissue was carefully dissected with blunt dissection forceps, the device was captured and pulled out without any complications.  The incision was then sutured with 2.0 Prolene, without any complications.  Patient instructions given, she will come back in 1 week for suture removal.   Yates Decamp, MD, [facility] 12/12/2019, 9:45 AM Office: [phone]

## 2019-12-12 NOTE — Patient Instructions (Signed)
Shower from tomorrow.  Take dressing off and apply Bandaid. Keep area dry.  If you notice any foul smell or severe pain, call us immediately.

## 2019-12-15 ENCOUNTER — Encounter: Payer: Self-pay | Admitting: Cardiology

## 2019-12-15 MED ORDER — OXYCODONE-ACETAMINOPHEN 7.5-325 MG PO TABS: 1.0000 | ORAL_TABLET | ORAL | 0 refills | Status: DC | PRN

## 2019-12-15 NOTE — Telephone Encounter (Signed)
Patient had loop recorder excision on Friday, she has developed discomfort at the site and request pain medications, Percocet 15 tablets sent without refill.  No swelling, no discharge, otherwise states that wound appears to be healing well.    ICD-10-CM   1. Post-op pain  G89.18 oxyCODONE-acetaminophen (PERCOCET) 7.5-325 MG tablet

## 2019-12-24 ENCOUNTER — Ambulatory Visit: Payer: BC Managed Care – PPO | Admitting: Cardiology

## 2019-12-25 ENCOUNTER — Other Ambulatory Visit: Payer: Self-pay

## 2019-12-25 ENCOUNTER — Ambulatory Visit: Payer: Self-pay | Admitting: Cardiology

## 2019-12-25 ENCOUNTER — Encounter: Payer: Self-pay | Admitting: Cardiology

## 2019-12-25 ENCOUNTER — Ambulatory Visit: Payer: BC Managed Care – PPO | Admitting: Cardiology

## 2019-12-25 DIAGNOSIS — Z4889 Encounter for other specified surgical aftercare: Secondary | ICD-10-CM

## 2019-12-25 NOTE — Progress Notes (Signed)
Loop recorder was explanted last week, I had placed 2 sutures which were removed in a aseptic and sterile fashion.  Wound is healed well.  I will see her back on a as needed basis.   Yates Decamp, MD, [facility] 12/25/2019, 1:49 PM Office: [phone]
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
## 2019-12-29 ENCOUNTER — Ambulatory Visit: Payer: BC Managed Care – PPO | Admitting: Neurology

## 2020-02-11 NOTE — Progress Notes (Signed)
Primary Physician/Referring:  Lewis Moccasinewey, Elizabeth R, MD  Patient ID: Pamela Booker, female    DOB: [birth year], 24 y.o.   MRN: [id number]  Chief Complaint  Patient presents with  . Dizziness  . Fatigue  . Nausea   HPI:    Pamela EdwardsBreanna Grace Captain  is a 24 y.o. Caucasian female patient with history of complex migraines, occasional PVCs and palpitations, episodes of syncope and near syncope since March 2020.  She had a loop recorded implanted 05/06/2019 to evaluate syncopal episodes. Upon neurological evaluation was diagnosed with complex migraines. With no further indication for monitoring loop recorder explantation done on 12/12/2019.  She was seen 07/14/2019 with musculoskeletal chest pain and given as needed tramadol.  She presents today with complaints of fatigue, dizziness, dyspnea on exertion starting 2 days ago. Also reports 2 days of mild non-exertional chest pain that is intermittent and lasting about 30 minutes at a time.  Denies cough, fever, chills, urinary frequency or urgency.  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  Her most recent negative COVID-19 test was 2 weeks ago.  Past Medical History:  Diagnosis Date  . Anxiety   . Encounter for loop recorder check 06/10/2019  . Hypertension   . Loop: Biotronic loop recorder in situ 05/06/2019   Scheduled Remote loop recorder check 06/09/2019: This represents an unremarkable monitoring session. The presenting rhythm is not available. No symptomatic or automatic episodes were triggered. Daily activity trends are stable. Heart rate variability trends are stable.  . Migraines   . Palpitations   . PVC (premature ventricular contraction)   . PVC's (premature ventricular contractions)   . Syncope and collapse 06/10/2019   Past Surgical History:  Procedure Laterality Date  . LAPAROSCOPY    . TONSILLECTOMY     Family History  Adopted: Yes  Problem Relation Age of Onset  .  Healthy Daughter     Social History   Tobacco Use  . Smoking status: Never Smoker  . Smokeless tobacco: Never Used  Substance Use Topics  . Alcohol use: Yes    Comment: socially   Marital Status: Divorced   ROS  Review of Systems  Constitutional: Positive for malaise/fatigue. Negative for fever.  Cardiovascular: Positive for chest pain (non exertional ), dyspnea on exertion and palpitations (chronic). Negative for leg swelling, orthopnea, paroxysmal nocturnal dyspnea and syncope.  Respiratory: Negative for cough, snoring and wheezing.   Neurological: Positive for dizziness.    Objective  Blood pressure 138/90, pulse (!) 104, height 5\' 2"  (1.575 m), weight 122 lb (55.3 kg), SpO2 96 %, unknown if currently breastfeeding.  Vitals with BMI 02/12/2020 12/12/2019 11/03/2019  Height 5\' 2"  5\' 2"  5\' 2"   Weight 122 lbs 123 lbs 6 oz 118 lbs  BMI 22.31 22.56 21.58  Systolic 138 114 [id number]  Diastolic 90 71 89  Pulse 104 71 90      Physical Exam Constitutional:      Comments: Patient appears fatigued.   Cardiovascular:     Rate and Rhythm: Normal rate and regular rhythm.     Pulses: Normal pulses.          Carotid pulses are 2+ on the right side and 2+ on the left side.      Radial pulses are 2+ on the right side and 2+ on the left side.       Dorsalis pedis pulses are 2+ on the right side and 2+ on the left  side.       Posterior tibial pulses are 2+ on the right side and 2+ on the left side.     Heart sounds: Normal heart sounds. No murmur heard.  No friction rub. No gallop.      Comments: No edema.  Pulmonary:     Effort: Pulmonary effort is normal.     Breath sounds: No wheezing, rhonchi or rales.  Abdominal:     General: Bowel sounds are normal.     Palpations: Abdomen is soft.     Tenderness: There is no abdominal tenderness. There is no right CVA tenderness or left CVA tenderness.  Skin:    General: Skin is warm.     Laboratory examination:   Recent Labs    03/11/19 1300    NA 140  K 3.9  CL 109  CO2 20*  GLUCOSE 86  BUN 6  CREATININE 0.78  CALCIUM 9.4  GFRNONAA >60  GFRAA >60   CrCl cannot be calculated (Patient's most recent lab result is older than the maximum 21 days allowed.).  CMP Latest Ref Rng & Units 03/11/2019 08/07/2018 03/05/2013  Glucose 70 - 99 mg/dL 86 84 82  BUN 6 - 20 mg/dL 6 9 15   Creatinine 0.44 - 1.00 mg/dL 1.61 0.96  Sodium 135 - 145 mmol/L 140 137 142  Potassium 3.5 - 5.1 mmol/L 3.9 4.0 3.6  Chloride 98 - 111 mmol/L 109 109 105  CO2 22 - 32 mmol/L 20(L) 19(L) 27  Calcium 8.9 - 10.3 mg/dL 9.4 9.7 9.5  Total Protein 6.5 - 8.1 g/dL 6.7 6.7 7.0  Total Bilirubin 0.3 - 1.2 mg/dL 0.7 0.6 0.3  Alkaline Phos 38 - 126 U/L 45 54 73  AST 15 - 41 U/L 23 20 20   ALT 0 - 44 U/L 42 21 21   CBC Latest Ref Rng & Units 03/11/2019 08/07/2018 03/05/2013  WBC 4.0 - 10.5 K/uL 7.1 6.1 9.0  Hemoglobin 12.0 - 15.0 g/dL 10/07/2018 05/05/2013 40.9  Hematocrit 36 - 46 % 42.5 44.6 39.0  Platelets 150 - 400 K/uL 267 248 251    Lipid Panel No results for input(s): CHOL, TRIG, LDLCALC, VLDL, HDL, CHOLHDL, LDLDIRECT in the last 8760 hours.  HEMOGLOBIN A1C Lab Results  Component Value Date   HGBA1C  07/16/2010    5.1 (NOTE)                                                                       According to the ADA Clinical Practice Recommendations for 2011, when HbA1c is used as a screening test:   >=6.5%   Diagnostic of Diabetes Mellitus           (if abnormal result  is confirmed)  5.7-6.4%   Increased risk of developing Diabetes Mellitus  References:Diagnosis and Classification of Diabetes Mellitus,Diabetes Care,2011,34(Suppl 1):S62-S69 and Standards of Medical Care in         Diabetes - 2011,Diabetes Care,2011,34  (Suppl 1):S11-S61.   MPG 100 07/16/2010   TSH No results for input(s): TSH in the last 8760 hours.  External labs:  03/31/2019:  HDL 45.000  Cholesterol, total 180.000  Triglycerides 87.000   Glucose Random 73.000  BUN 7.000  Creatinine,  Serum 0.700  TSH 0.923   08/07/2017:  LDL 76.000   Medications and allergies   Allergies  Allergen Reactions  . Nsaids Other (See Comments)    History of bleeding ulcers     Outpatient Medications Prior to Visit  Medication Sig Dispense Refill  . acetaminophen (TYLENOL) 500 MG tablet Take 2 tablets (1,000 mg total) by mouth every 8 (eight) hours as needed. 30 tablet 0  . ALPRAZolam (XANAX) 0.25 MG tablet Take 1 tablet by mouth as needed.    Marland Kitchen escitalopram (LEXAPRO) 20 MG tablet Take 20 mg by mouth daily.     . norethindrone (AYGESTIN) 5 MG tablet Take 5 mg by mouth daily.    . rizatriptan (MAXALT-MLT) 10 MG disintegrating tablet Take 10 mg by mouth as needed.     . topiramate ER (QUDEXY XR) 50 MG CS24 sprinkle capsule Take 100 mg by mouth daily.     . valACYclovir (VALTREX) 500 MG tablet Take 500 mg by mouth daily.    . verapamil (CALAN-SR) 180 MG CR tablet Take 1 tablet (180 mg total) by mouth at bedtime. 90 tablet 1  . omeprazole (PRILOSEC) 20 MG capsule Take 1 capsule (20 mg total) by mouth daily for 14 days. 14 capsule 0  . oxyCODONE-acetaminophen (PERCOCET) 7.5-325 MG tablet Take 1 tablet by mouth every 4 (four) hours as needed for severe pain. (Patient not taking: Reported on 02/12/2020) 15 tablet 0  . traMADol (ULTRAM) 50 MG tablet Take 1 tablet (50 mg total) by mouth every 6 (six) hours as needed. (Patient not taking: Reported on 02/12/2020) 30 tablet 0   No facility-administered medications prior to visit.     Radiology:   No results found. Mr Laqueta Jean Wo Contrast  Result Date: 04/15/2019 CLINICAL DATA:  Near syncopal episodes since May of this year. Tingling of the face and arms. Previous close head injury 2018. EXAM: MRI HEAD WITHOUT AND WITH CONTRAST TECHNIQUE: Multiplanar, multiecho pulse sequences of the brain and surrounding structures were obtained without and with intravenous contrast. CONTRAST:  69mL MULTIHANCE GADOBENATE DIMEGLUMINE 529 MG/ML IV SOLN COMPARISON:   Head CT 03/11/2019.  MRI 11/12/2014. FINDINGS: Brain: The brain has a normal appearance without evidence of malformation, atrophy, old or acute small or large vessel infarction, mass lesion, hemorrhage, hydrocephalus or extra-axial collection. After contrast administration, no abnormal enhancement occurs. Vascular: Major vessels at the base of the brain show flow. Venous sinuses appear patent. Skull and upper cervical spine: Normal. Sinuses/Orbits: Clear/normal. Other: None significant. IMPRESSION: Normal examination. No abnormality seen to explain the clinical presentation. No imaging sign of previous closed head injury. Electronically Signed   By: Paulina Fusi M.D.   On: 04/15/2019 09:02  Cardiac Studies:   48 hour holter monitor 10/02/2018: NSR. Occasional PVC's (6.2%). No SVT or A fib was noted.   Echocardiogram 10/30/2018: Normal LV systolic function with EF 55%. Left ventricle cavity is normal in size. Normal left ventricular wall thickness. Normal  global wall motion. Normal diastolic filling pattern. Calculated EF 55%. Left atrial cavity is normal in size. Intact interatrial septum. No shunting seen on agitated saline study.  Scheduled Remote loop recorder check 07/08/2019: This represents an unremarkable monitoring session. There were 6 symptomatic patient activations recorded (chest pain, palpitations and tingling). A review of the episode detail shows this was consistent with sinus rhythm and rare PVCs.  EKG: EKG 02/12/2020: Sinus tachycardia at a rate of 103 bpm with single PVC.  Normal axis.  No evidence of ischemia.   EKG  07/14/2019: Normal sinus rhythm with rate of 85 bpm, normal axis.  Incomplete right bundle block.  Single PVC.  Normal exam.    Assessment     ICD-10-CM   1. Dizziness and giddiness  R42 EKG 12-Lead  2. History of loop recorder  Z98.890   3. Other fatigue  R53.83 Urinalysis  4. Generalized weakness  R53.1 Urinalysis     Medications Discontinued During This  Encounter  Medication Reason  . oxyCODONE-acetaminophen (PERCOCET) 7.5-325 MG tablet Completed Course  . traMADol (ULTRAM) 50 MG tablet Completed Course    No orders of the defined types were placed in this encounter.   Recommendations:   Tiffaney Heimann is a 24 y.o. Caucasian female patient with chronic migraines, occasional PVCs and palpitations, episodes of questionable TIAs versus complex migraine, episodes of syncope and near syncope since March 2020 with extensive evaluation from neurology, has Biotronik loop recorder implantation performed on 05/06/2019, called me yesterday for frequent episodes of chest pain that started about 3 to 4 days ago and associated with dyspnea.  I brought him to be evaluated further.   Patient presents today with acute complaint of of 2-day history of fatigue, dyspnea on exertion, intermittent nonexertional chest pain.  Patient's EKG today shows sinus tachycardia with single PVC, no evidence of ischemia.  Orthostatic vitals did not reveal orthostatic hypotension. Physical exam is normal and unchanged from last visit.   In light of reassuring EKG and physical exam, as well as new onset of symptoms, suspect patient's symptoms are likely due to an acute bacterial or viral infection. Did a point of care COVID-19 test which was negative today in the office.  However this test is not highly sensitive, patient may need formal testing.  Suspect symptoms could be due to underlying urinary tract infection as patient has passed 2 kidney stones in the last few weeks.  Will order urinalysis to evaluate.  Discussed with patient that if urinalysis comes back negative would recommend she should get formal COVID-19 testing done.  Patient is in agreement.   We will follow up on as-needed basis.  Patient was seen in collaboration with Dr. Jacinto Halim. He also reviewed patient's chart and examined the patient. Dr. Jacinto Halim is in agreement of the plan.   During this visit I reviewed  and updated: Tobacco history  allergies medication reconciliation  medical history  surgical history  family history  social history.  This note was created using a voice recognition software as a result there may be grammatical errors inadvertently enclosed that do not reflect the nature of this encounter. Every attempt is made to correct such errors.   Rayford Halsted, PA-C 02/12/2020, 4:33 PM Office: [phone]

## 2020-02-12 ENCOUNTER — Other Ambulatory Visit: Payer: Self-pay

## 2020-02-12 ENCOUNTER — Ambulatory Visit: Payer: Self-pay | Admitting: Student

## 2020-02-12 ENCOUNTER — Encounter: Payer: Self-pay | Admitting: Student

## 2020-02-12 VITALS — BP 138/90 | HR 104 | Ht 62.0 in | Wt 122.0 lb

## 2020-02-12 DIAGNOSIS — R42 Dizziness and giddiness: Secondary | ICD-10-CM

## 2020-02-12 DIAGNOSIS — R531 Weakness: Secondary | ICD-10-CM

## 2020-02-12 DIAGNOSIS — R5383 Other fatigue: Secondary | ICD-10-CM

## 2020-02-13 ENCOUNTER — Telehealth: Payer: Self-pay | Admitting: Student

## 2020-02-13 LAB — URINALYSIS
Bilirubin, UA: NEGATIVE
Leukocytes,UA: NEGATIVE
Nitrite, UA: NEGATIVE
Protein,UA: NEGATIVE
RBC, UA: NEGATIVE
Specific Gravity, UA: >=1.03 — AB (ref 1.005–1.030)
Urobilinogen, Ur: 1 mg/dL (ref 0.2–1.0)
pH, UA: 5.5 (ref 5.0–7.5)

## 2020-02-13 NOTE — Telephone Encounter (Signed)
Called patient to discuss results of UA. It is no indicative of UTI, however did indicated she is mildly dehydrated. Encouraged patient to increase fluid intake and to get formal Covid-19 testing done. Patient expressed understanding and agreement with the plan.

## 2020-02-25 DIAGNOSIS — R002 Palpitations: Secondary | ICD-10-CM

## 2020-02-25 NOTE — Telephone Encounter (Signed)
From patient.

## 2020-02-26 MED ORDER — ACEBUTOLOL HCL 200 MG PO CAPS: 200.0000 mg | ORAL_CAPSULE | Freq: Two times a day (BID) | ORAL | 1 refills | Status: DC

## 2020-02-26 NOTE — Telephone Encounter (Signed)
From patient.

## 2020-02-26 NOTE — Telephone Encounter (Signed)
Patient persist to have symptoms of palpitations, she has been on chronic verapamil which I will discontinue and switch her to Acebutolol 200mg  p.o. twice daily.    ICD-10-CM   1. Palpitations  R00.2 acebutolol (SECTRAL) 200 MG capsule    Medications Discontinued During This Encounter  Medication Reason  . verapamil (CALAN-SR) 180 MG CR tablet Ineffective     , MD, [facility] 02/26/2020, 5:16 PM Office: [phone]

## 2020-03-18 DIAGNOSIS — N200 Calculus of kidney: Secondary | ICD-10-CM | POA: Diagnosis not present

## 2020-04-16 DIAGNOSIS — N926 Irregular menstruation, unspecified: Secondary | ICD-10-CM | POA: Diagnosis not present

## 2020-04-16 DIAGNOSIS — Z3202 Encounter for pregnancy test, result negative: Secondary | ICD-10-CM | POA: Diagnosis not present

## 2020-04-20 DIAGNOSIS — K529 Noninfective gastroenteritis and colitis, unspecified: Secondary | ICD-10-CM | POA: Diagnosis not present

## 2020-04-23 ENCOUNTER — Ambulatory Visit: Payer: BC Managed Care – PPO | Admitting: Cardiology

## 2020-05-05 ENCOUNTER — Emergency Department (HOSPITAL_COMMUNITY)
Admission: EM | Admit: 2020-05-05 | Discharge: 2020-05-05 | Disposition: A | Discharge status: Home / Self Care | Payer: BC Managed Care – PPO | Attending: Emergency Medicine | Admitting: Emergency Medicine

## 2020-05-05 ENCOUNTER — Encounter (HOSPITAL_COMMUNITY): Payer: Self-pay

## 2020-05-05 DIAGNOSIS — R569 Unspecified convulsions: Secondary | ICD-10-CM | POA: Insufficient documentation

## 2020-05-05 DIAGNOSIS — R410 Disorientation, unspecified: Secondary | ICD-10-CM | POA: Diagnosis not present

## 2020-05-05 DIAGNOSIS — I10 Essential (primary) hypertension: Secondary | ICD-10-CM | POA: Insufficient documentation

## 2020-05-05 DIAGNOSIS — R29810 Facial weakness: Secondary | ICD-10-CM | POA: Diagnosis not present

## 2020-05-05 DIAGNOSIS — R0902 Hypoxemia: Secondary | ICD-10-CM | POA: Diagnosis not present

## 2020-05-05 LAB — COMPREHENSIVE METABOLIC PANEL
ALT: 54 U/L — ABNORMAL HIGH (ref 0–44)
AST: 41 U/L (ref 15–41)
Albumin: 4.8 g/dL (ref 3.5–5.0)
Alkaline Phosphatase: 44 U/L (ref 38–126)
Anion gap: 10 (ref 5–15)
BUN: 18 mg/dL (ref 6–20)
CO2: 23 mmol/L (ref 22–32)
Calcium: 9.8 mg/dL (ref 8.9–10.3)
Chloride: 106 mmol/L (ref 98–111)
Creatinine, Ser: 0.82 mg/dL (ref 0.44–1.00)
GFR, Estimated: >60 mL/min (ref >60)
Glucose, Bld: 109 mg/dL — ABNORMAL HIGH (ref 70–99)
Potassium: 3.6 mmol/L (ref 3.5–5.1)
Sodium: 139 mmol/L (ref 135–145)
Total Bilirubin: 0.7 mg/dL (ref 0.3–1.2)
Total Protein: 7.5 g/dL (ref 6.5–8.1)

## 2020-05-05 LAB — CBC WITH DIFFERENTIAL/PLATELET
Abs Immature Granulocytes: 0.03 10*3/uL (ref 0.00–0.07)
Basophils Absolute: 0 10*3/uL (ref 0.0–0.1)
Basophils Relative: 0 %
Eosinophils Absolute: 0.1 10*3/uL (ref 0.0–0.5)
Eosinophils Relative: 1 %
HCT: 42.7 % (ref 36.0–46.0)
Hemoglobin: 14.5 g/dL (ref 12.0–15.0)
Immature Granulocytes: 0 %
Lymphocytes Relative: 25 %
Lymphs Abs: 2.4 10*3/uL (ref 0.7–4.0)
MCH: 33.4 pg (ref 26.0–34.0)
MCHC: 34 g/dL (ref 30.0–36.0)
MCV: 98.4 fL (ref 80.0–100.0)
Monocytes Absolute: 0.9 10*3/uL (ref 0.1–1.0)
Monocytes Relative: 9 %
Neutro Abs: 6.2 10*3/uL (ref 1.7–7.7)
Neutrophils Relative %: 65 %
Platelets: 302 10*3/uL (ref 150–400)
RBC: 4.34 MIL/uL (ref 3.87–5.11)
RDW: 12.4 % (ref 11.5–15.5)
WBC: 9.6 10*3/uL (ref 4.0–10.5)
nRBC: 0 % (ref 0.0–0.2)

## 2020-05-05 LAB — I-STAT BETA HCG BLOOD, ED (MC, WL, AP ONLY): I-stat hCG, quantitative: <5 m[IU]/mL (ref <5)
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
## 2020-05-06 ENCOUNTER — Emergency Department (HOSPITAL_COMMUNITY)
Admission: EM | Admit: 2020-05-06 | Discharge: 2020-05-06 | Disposition: A | Discharge status: Home / Self Care | Payer: BC Managed Care – PPO | Attending: Emergency Medicine | Admitting: Emergency Medicine

## 2020-05-06 ENCOUNTER — Other Ambulatory Visit: Payer: Self-pay

## 2020-05-06 DIAGNOSIS — I10 Essential (primary) hypertension: Secondary | ICD-10-CM | POA: Insufficient documentation

## 2020-05-06 DIAGNOSIS — R569 Unspecified convulsions: Secondary | ICD-10-CM | POA: Diagnosis not present

## 2020-05-06 LAB — COMPREHENSIVE METABOLIC PANEL
ALT: 50 U/L — ABNORMAL HIGH (ref 0–44)
AST: 33 U/L (ref 15–41)
Albumin: 4.7 g/dL (ref 3.5–5.0)
Alkaline Phosphatase: 44 U/L (ref 38–126)
Anion gap: 12 (ref 5–15)
BUN: 18 mg/dL (ref 6–20)
CO2: 20 mmol/L — ABNORMAL LOW (ref 22–32)
Calcium: 9.8 mg/dL (ref 8.9–10.3)
Chloride: 106 mmol/L (ref 98–111)
Creatinine, Ser: 0.76 mg/dL (ref 0.44–1.00)
GFR, Estimated: >60 mL/min (ref >60)
Glucose, Bld: 83 mg/dL (ref 70–99)
Potassium: 3.6 mmol/L (ref 3.5–5.1)
Sodium: 138 mmol/L (ref 135–145)
Total Bilirubin: 1 mg/dL (ref 0.3–1.2)
Total Protein: 7.5 g/dL (ref 6.5–8.1)

## 2020-05-06 LAB — I-STAT BETA HCG BLOOD, ED (MC, WL, AP ONLY): I-stat hCG, quantitative: <5 m[IU]/mL (ref <5)

## 2020-05-06 LAB — ETHANOL: Alcohol, Ethyl (B): <10 mg/dL (ref <10)

## 2020-05-06 LAB — MAGNESIUM: Magnesium: 2.4 mg/dL (ref 1.7–2.4)

## 2020-05-06 LAB — CBC WITH DIFFERENTIAL/PLATELET
Abs Immature Granulocytes: 0.1 10*3/uL — ABNORMAL HIGH (ref 0.00–0.07)
Basophils Absolute: 0.1 10*3/uL (ref 0.0–0.1)
Basophils Relative: 1 %
Eosinophils Absolute: 0.1 10*3/uL (ref 0.0–0.5)
Eosinophils Relative: 1 %
HCT: 46 % (ref 36.0–46.0)
Hemoglobin: 15.4 g/dL — ABNORMAL HIGH (ref 12.0–15.0)
Immature Granulocytes: 1 %
Lymphocytes Relative: 22 %
Lymphs Abs: 2.1 10*3/uL (ref 0.7–4.0)
MCH: 33.6 pg (ref 26.0–34.0)
MCHC: 33.5 g/dL (ref 30.0–36.0)
MCV: 100.4 fL — ABNORMAL HIGH (ref 80.0–100.0)
Monocytes Absolute: 0.6 10*3/uL (ref 0.1–1.0)
Monocytes Relative: 7 %
Neutro Abs: 6.7 10*3/uL (ref 1.7–7.7)
Neutrophils Relative %: 68 %
Platelets: 344 10*3/uL (ref 150–400)
RBC: 4.58 MIL/uL (ref 3.87–5.11)
RDW: 12.5 % (ref 11.5–15.5)
WBC: 9.7 10*3/uL (ref 4.0–10.5)
nRBC: 0 % (ref 0.0–0.2)

## 2020-05-06 MED ORDER — LEVETIRACETAM 500 MG PO TABS: 500.0000 mg | ORAL_TABLET | Freq: Two times a day (BID) | ORAL | 0 refills | Status: DC

## 2020-05-06 MED ORDER — LEVETIRACETAM IN NACL 1000 MG/100ML IV SOLN
1000.0000 mg | Freq: Two times a day (BID) | INTRAVENOUS | Status: DC
  Administered 2020-05-06: 1000 mg via INTRAVENOUS
  Filled 2020-05-06: qty 100

## 2020-05-06 MED ORDER — SODIUM CHLORIDE 0.9 % IV BOLUS
1000.0000 mL | Freq: Once | INTRAVENOUS | Status: AC
  Administered 2020-05-06: 1000 mL via INTRAVENOUS

## 2020-05-06 MED ORDER — ACETAMINOPHEN 500 MG PO TABS
1000.0000 mg | ORAL_TABLET | Freq: Once | ORAL | Status: AC
  Administered 2020-05-06: 1000 mg via ORAL
  Filled 2020-05-06: qty 2
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
## 2020-05-06 NOTE — Discharge Instructions (Signed)
As discussed, your evaluation today has been largely reassuring.  But, it is important that you monitor your condition carefully, and do not hesitate to return to the ED if you develop new, or concerning changes in your condition. ? ?Otherwise, please follow-up with your physician for appropriate ongoing care. ? ?

## 2020-05-06 NOTE — ED Triage Notes (Signed)
Pt was seen last night for seizure of similar symptoms pt reports not being on any medications for seizures and is being watched by neurology for the past yr. Pt presents with dilated pupils and stuttered speech.

## 2020-05-11 DIAGNOSIS — F411 Generalized anxiety disorder: Secondary | ICD-10-CM | POA: Diagnosis not present

## 2020-05-11 DIAGNOSIS — F331 Major depressive disorder, recurrent, moderate: Secondary | ICD-10-CM | POA: Diagnosis not present

## 2020-05-11 DIAGNOSIS — R569 Unspecified convulsions: Secondary | ICD-10-CM | POA: Diagnosis not present

## 2020-06-02 ENCOUNTER — Ambulatory Visit (HOSPITAL_COMMUNITY): Admit: 2020-06-02 | Discharge status: Against Medical Advice | Payer: BC Managed Care – PPO

## 2020-06-03 DIAGNOSIS — R059 Cough, unspecified: Secondary | ICD-10-CM | POA: Diagnosis not present

## 2020-06-03 DIAGNOSIS — Z20822 Contact with and (suspected) exposure to covid-19: Secondary | ICD-10-CM | POA: Diagnosis not present

## 2020-06-03 DIAGNOSIS — G4489 Other headache syndrome: Secondary | ICD-10-CM | POA: Diagnosis not present

## 2020-06-03 DIAGNOSIS — R0981 Nasal congestion: Secondary | ICD-10-CM | POA: Diagnosis not present

## 2020-06-05 HISTORY — PX: VAGUS NERVE STIMULATOR INSERTION: SHX348

## 2020-06-17 DIAGNOSIS — Z20822 Contact with and (suspected) exposure to covid-19: Secondary | ICD-10-CM | POA: Diagnosis not present

## 2020-06-17 DIAGNOSIS — Z03818 Encounter for observation for suspected exposure to other biological agents ruled out: Secondary | ICD-10-CM | POA: Diagnosis not present

## 2020-06-23 DIAGNOSIS — R102 Pelvic and perineal pain: Secondary | ICD-10-CM | POA: Diagnosis not present

## 2020-07-01 ENCOUNTER — Other Ambulatory Visit: Payer: Self-pay | Admitting: Cardiology

## 2020-07-01 DIAGNOSIS — F331 Major depressive disorder, recurrent, moderate: Secondary | ICD-10-CM | POA: Diagnosis not present

## 2020-07-01 DIAGNOSIS — R002 Palpitations: Secondary | ICD-10-CM

## 2020-07-01 DIAGNOSIS — R569 Unspecified convulsions: Secondary | ICD-10-CM | POA: Diagnosis not present

## 2020-07-01 DIAGNOSIS — G4700 Insomnia, unspecified: Secondary | ICD-10-CM | POA: Diagnosis not present

## 2020-07-01 DIAGNOSIS — F411 Generalized anxiety disorder: Secondary | ICD-10-CM | POA: Diagnosis not present
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
## 2020-07-23 ENCOUNTER — Ambulatory Visit: Payer: BC Managed Care – PPO | Admitting: Diagnostic Neuroimaging

## 2020-10-02 ENCOUNTER — Other Ambulatory Visit: Payer: Self-pay

## 2020-10-02 ENCOUNTER — Encounter: Payer: Self-pay | Admitting: Emergency Medicine

## 2020-10-02 ENCOUNTER — Ambulatory Visit
Admission: EM | Admit: 2020-10-02 | Discharge: 2020-10-02 | Disposition: A | Discharge status: Home / Self Care | Payer: BC Managed Care – PPO | Attending: Emergency Medicine | Admitting: Emergency Medicine

## 2020-10-02 DIAGNOSIS — M545 Low back pain, unspecified: Secondary | ICD-10-CM

## 2020-10-02 DIAGNOSIS — R109 Unspecified abdominal pain: Secondary | ICD-10-CM

## 2020-10-02 HISTORY — DX: Unspecified convulsions: R56.9

## 2020-10-02 MED ORDER — TIZANIDINE HCL 2 MG PO TABS: 2.0000 mg | ORAL_TABLET | Freq: Four times a day (QID) | ORAL | 0 refills | Status: DC | PRN

## 2020-10-02 NOTE — Discharge Instructions (Addendum)
Continue Tylenol up to 1000 mg every 4-6 hours for pain May try low-dose muscle relaxer to help with back pain Alternate ice and heat to abdomen and back If symptoms progressing or worsening please follow-up in emergency room especially if you are developing worsening abdominal pain, abdominal distention, worsening pain with eating drinking or bowel/urination off from normal.
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
## 2020-12-04 ENCOUNTER — Emergency Department (HOSPITAL_COMMUNITY)
Admission: EM | Admit: 2020-12-04 | Discharge: 2020-12-04 | Disposition: A | Discharge status: Home / Self Care | Payer: No Typology Code available for payment source | Attending: Emergency Medicine | Admitting: Emergency Medicine

## 2020-12-04 ENCOUNTER — Other Ambulatory Visit: Payer: Self-pay

## 2020-12-04 ENCOUNTER — Encounter (HOSPITAL_COMMUNITY): Payer: Self-pay

## 2020-12-04 DIAGNOSIS — R251 Tremor, unspecified: Secondary | ICD-10-CM | POA: Insufficient documentation

## 2020-12-04 DIAGNOSIS — R569 Unspecified convulsions: Secondary | ICD-10-CM | POA: Diagnosis not present

## 2020-12-04 DIAGNOSIS — I10 Essential (primary) hypertension: Secondary | ICD-10-CM | POA: Diagnosis not present

## 2020-12-04 DIAGNOSIS — Z79899 Other long term (current) drug therapy: Secondary | ICD-10-CM | POA: Insufficient documentation

## 2020-12-04 LAB — CBC
HCT: 39.7 % (ref 36.0–46.0)
Hemoglobin: 13.4 g/dL (ref 12.0–15.0)
MCH: 33.8 pg (ref 26.0–34.0)
MCHC: 33.8 g/dL (ref 30.0–36.0)
MCV: 100 fL (ref 80.0–100.0)
Platelets: 241 10*3/uL (ref 150–400)
RBC: 3.97 MIL/uL (ref 3.87–5.11)
RDW: 13.2 % (ref 11.5–15.5)
WBC: 6.1 10*3/uL (ref 4.0–10.5)
nRBC: 0 % (ref 0.0–0.2)

## 2020-12-04 LAB — BASIC METABOLIC PANEL
Anion gap: 7 (ref 5–15)
BUN: 13 mg/dL (ref 6–20)
CO2: 24 mmol/L (ref 22–32)
Calcium: 8.5 mg/dL — ABNORMAL LOW (ref 8.9–10.3)
Chloride: 110 mmol/L (ref 98–111)
Creatinine, Ser: 0.6 mg/dL (ref 0.44–1.00)
GFR, Estimated: >60 mL/min (ref >60)
Glucose, Bld: 119 mg/dL — ABNORMAL HIGH (ref 70–99)
Potassium: 3.3 mmol/L — ABNORMAL LOW (ref 3.5–5.1)
Sodium: 141 mmol/L (ref 135–145)

## 2020-12-04 LAB — HCG, QUANTITATIVE, PREGNANCY: hCG, Beta Chain, Quant, S: <1 m[IU]/mL (ref <5)

## 2020-12-04 MED ORDER — KETOROLAC TROMETHAMINE 30 MG/ML IJ SOLN
15.0000 mg | Freq: Once | INTRAMUSCULAR | Status: AC
  Administered 2020-12-04: 15 mg via INTRAVENOUS
  Filled 2020-12-04: qty 1

## 2020-12-04 MED ORDER — SODIUM CHLORIDE 0.9 % IV BOLUS
1000.0000 mL | Freq: Once | INTRAVENOUS | Status: AC
  Administered 2020-12-04: 1000 mL via INTRAVENOUS

## 2020-12-04 MED ORDER — BUTALBITAL-APAP-CAFFEINE 50-325-40 MG PO TABS: 1.0000 | ORAL_TABLET | Freq: Four times a day (QID) | ORAL | 0 refills | Status: DC | PRN

## 2020-12-04 MED ORDER — LEVETIRACETAM IN NACL 1000 MG/100ML IV SOLN
1000.0000 mg | Freq: Once | INTRAVENOUS | Status: AC
  Administered 2020-12-04: 1000 mg via INTRAVENOUS
  Filled 2020-12-04: qty 100

## 2020-12-04 NOTE — ED Provider Notes (Signed)
[facility] EMERGENCY DEPARTMENT Provider Note   CSN: [id number] Arrival date & time: 12/04/20  0249     History Chief Complaint  Patient presents with   Seizures    Pamela Booker is a 25 y.o. female.  Patient brought to the emergency department after seizure-like activity.  Patient with generalized shaking episodes earlier today.      Past Medical History:  Diagnosis Date   Anxiety    Encounter for loop recorder check 06/10/2019   Hypertension    Loop: Biotronic loop recorder in situ 05/06/2019   Scheduled Remote loop recorder check 06/09/2019: This represents an unremarkable monitoring session. The presenting rhythm is not available. No symptomatic or automatic episodes were triggered. Daily activity trends are stable. Heart rate variability trends are stable.   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   PVC (premature ventricular contraction) 03/11/2018   Endometriosis 03/14/2017    Past Surgical History:  Procedure Laterality Date   LAPAROSCOPY     TONSILLECTOMY       OB History     Gravida  1   Para  0   Term  0   Preterm  0   AB  0   Living         SAB  0   IAB  0   Ectopic  0   Multiple      Live Births              Family History  Adopted: Yes  Problem Relation Age of Onset   Healthy Daughter     Social History   Tobacco Use   Smoking status: Never   Smokeless tobacco: Never  Vaping Use   Vaping Use: Never used  Substance Use Topics   Alcohol use: Yes    Comment: socially   Drug use: No    Home Medications Prior to Admission medications   Medication Sig Start Date End Date Taking? Authorizing Provider  butalbital-acetaminophen-caffeine (FIORICET) 50-325-40 MG tablet Take 1-2 tablets by mouth every  6 (six) hours as needed for headache. 12/04/20  Yes , Canary Brim, MD  acebutolol (SECTRAL) 200 MG capsule TAKE 1 CAPSULE(200 MG) BY MOUTH TWICE DAILY. D/C VERAPAMIL 07/01/20   Cantwell, Celeste C, PA-C  AJOVY 225 MG/1.5ML SOAJ Inject 1.5 mLs into the skin every 30 (thirty) days.  12/11/19   [provider]  escitalopram (LEXAPRO) 20 MG tablet Take 20 mg by mouth daily.  08/23/18   [provider]  levETIRAcetam (KEPPRA) 500 MG tablet Take 1 tablet (500 mg total) by mouth 2 (two) times daily. 05/06/20   Gerhard Munch, MD  LORazepam (ATIVAN) 0.5 MG tablet Take 0.5 mg by mouth daily as needed for anxiety.    [provider]  norethindrone (AYGESTIN) 5 MG tablet Take 5 mg by mouth daily. 02/26/19 05/06/20  [provider]  PRESCRIPTION MEDICATION Apply 1 application topically every other day. Apply to face at night - Hers 20 ml Acne ( pump) Az/Zp (0.0125%)    [provider]  rizatriptan (MAXALT-MLT) 10 MG disintegrating tablet Take 10 mg by mouth daily as needed for migraine.  03/17/19   [provider]  tiZANidine (ZANAFLEX) 2 MG tablet Take 1-2 tablets (2-4 mg total) by  mouth every 6 (six) hours as needed for muscle spasms. 10/02/20   Wieters, Hallie C, PA-C  topiramate (TOPAMAX) 100 MG tablet Take 100 mg by mouth daily.    [provider]  topiramate ER (QUDEXY XR) 50 MG CS24 sprinkle capsule Take 100 mg by mouth daily.  Patient not taking: Reported on 05/06/2020 09/16/18   [provider]  valACYclovir (VALTREX) 500 MG tablet Take 500 mg by mouth daily.    [provider]  omeprazole (PRILOSEC) 20 MG capsule Take 1 capsule (20 mg total) by mouth daily for 14 days. Patient not taking: Reported on 05/06/2020 11/03/19 10/02/20  Darr, Gerilyn Pilgrim, PA-C    Allergies    Nsaids  Review of Systems   Review of Systems  Neurological:  Positive for seizures.  All other systems reviewed and are negative.  Physical Exam Updated  Vital Signs BP 103/64 (BP Location: Left Arm)   Pulse 91   Temp 98.8 F (37.1 C) (Oral)   Resp 18   Ht 5\' 2"  (1.575 m)   Wt 55.3 kg   SpO2 96%   BMI 22.30 kg/m   Physical Exam Vitals and nursing note reviewed.  Constitutional:      General: She is not in acute distress.    Appearance: Normal appearance. She is well-developed.  HENT:     Head: Normocephalic and atraumatic.     Right Ear: Hearing normal.     Left Ear: Hearing normal.     Nose: Nose normal.  Eyes:     Conjunctiva/sclera: Conjunctivae normal.     Pupils: Pupils are equal, round, and reactive to light.  Cardiovascular:     Rate and Rhythm: Regular rhythm.     Heart sounds: S1 normal and S2 normal. No murmur heard.   No friction rub. No gallop.  Pulmonary:     Effort: Pulmonary effort is normal. No respiratory distress.     Breath sounds: Normal breath sounds.  Chest:     Chest wall: No tenderness.  Abdominal:     General: Bowel sounds are normal.     Palpations: Abdomen is soft.     Tenderness: There is no abdominal tenderness. There is no guarding or rebound. Negative signs include Murphy's sign and McBurney's sign.     Hernia: No hernia is present.  Musculoskeletal:        General: Normal range of motion.     Cervical back: Normal range of motion and neck supple.  Skin:    General: Skin is warm and dry.     Findings: No rash.  Neurological:     Mental Status: She is alert and oriented to person, place, and time.     GCS: GCS eye subscore is 4. GCS verbal subscore is 5. GCS motor subscore is 6.     Cranial Nerves: No cranial nerve deficit.     Sensory: No sensory deficit.     Coordination: Coordination normal.  Psychiatric:        Speech: Speech normal.        Behavior: Behavior normal.        Thought Content: Thought content normal.    ED Results / Procedures / Treatments   Labs (all labs ordered are listed, but only abnormal results are displayed) Labs Reviewed  BASIC METABOLIC PANEL -  Abnormal; Notable for the following components:      Result Value   Potassium 3.3 (*)    Glucose, Bld 119 (*)    Calcium 8.5 (*)  All other components within normal limits  CBC  HCG, QUANTITATIVE, PREGNANCY    EKG None  Radiology No results found.  Procedures Procedures   Medications Ordered in ED Medications  levETIRAcetam (KEPPRA) IVPB 1000 mg/100 mL premix (0 mg Intravenous Stopped 12/04/20 0403)  sodium chloride 0.9 % bolus 1,000 mL (0 mLs Intravenous Stopped 12/04/20 0533)  ketorolac (TORADOL) 30 MG/ML injection 15 mg (15 mg Intravenous Given 12/04/20 0417)    ED Course  I have reviewed the triage vital signs and the nursing notes.  Pertinent labs & imaging results that were available during my care of the patient were reviewed by me and considered in my medical decision making (see chart for details).    MDM Rules/Calculators/A&P                          Patient awake and alert.  Strange speech pattern (stuttering when speaking) noted, otherwise no neurologic deficits.  Reviewing her records reveals previous episodes labeled seizures.  It is unclear if she has ever followed up with neurology.  I do see a note from neurology at [facility] in April where she was seeking follow-up but that visit did not occur.  There is mention in previous notes, however, that she has had normal ambulatory EEG.  Patient is currently on Keppra.  She reports taking her doses as prescribed.  She was given IV Keppra here in the emergency department and monitored.  No further seizures.  She has follow-up in just over a week at [facility] neurology.  Will maintain current dosing regimen until that follow-up occurs.  Final Clinical Impression(s) / ED Diagnoses Final diagnoses:  Seizure-like activity [facility])    Rx / DC Orders ED Discharge Orders          Ordered    butalbital-acetaminophen-caffeine (FIORICET) 50-325-40 MG tablet  Every 6 hours PRN        12/04/20 0411              Gilda Crease, MD 12/05/20 [phone]

## 2020-12-04 NOTE — ED Triage Notes (Signed)
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 Per ems pt has a little alcohol and taken her meds last night

## 2021-01-17 ENCOUNTER — Other Ambulatory Visit: Payer: Self-pay | Admitting: Student

## 2021-01-17 DIAGNOSIS — R002 Palpitations: Secondary | ICD-10-CM

## 2021-02-14 DIAGNOSIS — G40219 Localization-related (focal) (partial) symptomatic epilepsy and epileptic syndromes with complex partial seizures, intractable, without status epilepticus: Secondary | ICD-10-CM | POA: Diagnosis not present

## 2021-03-02 DIAGNOSIS — G40209 Localization-related (focal) (partial) symptomatic epilepsy and epileptic syndromes with complex partial seizures, not intractable, without status epilepticus: Secondary | ICD-10-CM | POA: Diagnosis not present

## 2021-03-02 DIAGNOSIS — R110 Nausea: Secondary | ICD-10-CM | POA: Diagnosis not present

## 2021-03-02 DIAGNOSIS — G43909 Migraine, unspecified, not intractable, without status migrainosus: Secondary | ICD-10-CM | POA: Diagnosis not present

## 2021-03-02 DIAGNOSIS — Z760 Encounter for issue of repeat prescription: Secondary | ICD-10-CM | POA: Diagnosis not present

## 2021-03-08 ENCOUNTER — Encounter (HOSPITAL_BASED_OUTPATIENT_CLINIC_OR_DEPARTMENT_OTHER): Payer: Self-pay | Admitting: Emergency Medicine

## 2021-03-08 ENCOUNTER — Emergency Department (HOSPITAL_BASED_OUTPATIENT_CLINIC_OR_DEPARTMENT_OTHER)
Admission: EM | Admit: 2021-03-08 | Discharge: 2021-03-08 | Disposition: A | Discharge status: Home / Self Care | Payer: BC Managed Care – PPO | Attending: Emergency Medicine | Admitting: Emergency Medicine

## 2021-03-08 ENCOUNTER — Other Ambulatory Visit: Payer: Self-pay

## 2021-03-08 ENCOUNTER — Encounter: Payer: Self-pay | Admitting: Emergency Medicine

## 2021-03-08 ENCOUNTER — Emergency Department (HOSPITAL_BASED_OUTPATIENT_CLINIC_OR_DEPARTMENT_OTHER): Payer: BC Managed Care – PPO

## 2021-03-08 ENCOUNTER — Ambulatory Visit
Admission: EM | Admit: 2021-03-08 | Discharge: 2021-03-08 | Disposition: A | Discharge status: Nursing Home (Includes ICF) | Payer: BC Managed Care – PPO | Attending: Internal Medicine | Admitting: Internal Medicine

## 2021-03-08 DIAGNOSIS — R109 Unspecified abdominal pain: Secondary | ICD-10-CM | POA: Diagnosis not present

## 2021-03-08 DIAGNOSIS — R1031 Right lower quadrant pain: Secondary | ICD-10-CM | POA: Insufficient documentation

## 2021-03-08 DIAGNOSIS — Z79899 Other long term (current) drug therapy: Secondary | ICD-10-CM | POA: Diagnosis not present

## 2021-03-08 DIAGNOSIS — I10 Essential (primary) hypertension: Secondary | ICD-10-CM | POA: Diagnosis not present

## 2021-03-08 DIAGNOSIS — N200 Calculus of kidney: Secondary | ICD-10-CM | POA: Diagnosis not present

## 2021-03-08 HISTORY — DX: Epilepsy, unspecified, not intractable, without status epilepticus: G40.909

## 2021-03-08 LAB — CBC WITH DIFFERENTIAL/PLATELET
Abs Immature Granulocytes: 0.01 10*3/uL (ref 0.00–0.07)
Basophils Absolute: 0 10*3/uL (ref 0.0–0.1)
Basophils Relative: 0 %
Eosinophils Absolute: 0.1 10*3/uL (ref 0.0–0.5)
Eosinophils Relative: 2 %
HCT: 41.8 % (ref 36.0–46.0)
Hemoglobin: 13.9 g/dL (ref 12.0–15.0)
Immature Granulocytes: 0 %
Lymphocytes Relative: 33 %
Lymphs Abs: 2.1 10*3/uL (ref 0.7–4.0)
MCH: 32.8 pg (ref 26.0–34.0)
MCHC: 33.3 g/dL (ref 30.0–36.0)
MCV: 98.6 fL (ref 80.0–100.0)
Monocytes Absolute: 0.5 10*3/uL (ref 0.1–1.0)
Monocytes Relative: 7 %
Neutro Abs: 3.5 10*3/uL (ref 1.7–7.7)
Neutrophils Relative %: 58 %
Platelets: 273 10*3/uL (ref 150–400)
RBC: 4.24 MIL/uL (ref 3.87–5.11)
RDW: 12.6 % (ref 11.5–15.5)
WBC: 6.2 10*3/uL (ref 4.0–10.5)
nRBC: 0 % (ref 0.0–0.2)

## 2021-03-08 LAB — POCT URINALYSIS DIP (MANUAL ENTRY)
Bilirubin, UA: NEGATIVE
Blood, UA: NEGATIVE
Glucose, UA: 250 mg/dL — AB
Leukocytes, UA: NEGATIVE
Nitrite, UA: NEGATIVE
Protein Ur, POC: NEGATIVE mg/dL
Spec Grav, UA: 1.025 (ref 1.010–1.025)
Urobilinogen, UA: 1 E.U./dL
pH, UA: 7 (ref 5.0–8.0)

## 2021-03-08 LAB — HEPATIC FUNCTION PANEL
ALT: 18 U/L (ref 0–44)
AST: 15 U/L (ref 15–41)
Albumin: 4.8 g/dL (ref 3.5–5.0)
Alkaline Phosphatase: 49 U/L (ref 38–126)
Bilirubin, Direct: 0.1 mg/dL (ref 0.0–0.2)
Indirect Bilirubin: 0.4 mg/dL (ref 0.3–0.9)
Total Bilirubin: 0.5 mg/dL (ref 0.3–1.2)
Total Protein: 7.2 g/dL (ref 6.5–8.1)

## 2021-03-08 LAB — URINALYSIS, ROUTINE W REFLEX MICROSCOPIC
Bilirubin Urine: NEGATIVE
Glucose, UA: 250 mg/dL — AB
Hgb urine dipstick: NEGATIVE
Ketones, ur: 15 mg/dL — AB
Leukocytes,Ua: NEGATIVE
Nitrite: NEGATIVE
Protein, ur: NEGATIVE mg/dL
Specific Gravity, Urine: 1.01 (ref 1.005–1.030)
pH: 6.5 (ref 5.0–8.0)

## 2021-03-08 LAB — BASIC METABOLIC PANEL
Anion gap: 9 (ref 5–15)
BUN: 7 mg/dL (ref 6–20)
CO2: 26 mmol/L (ref 22–32)
Calcium: 9.9 mg/dL (ref 8.9–10.3)
Chloride: 105 mmol/L (ref 98–111)
Creatinine, Ser: 0.69 mg/dL (ref 0.44–1.00)
GFR, Estimated: >60 mL/min (ref >60)
Glucose, Bld: 79 mg/dL (ref 70–99)
Potassium: 3.9 mmol/L (ref 3.5–5.1)
Sodium: 140 mmol/L (ref 135–145)

## 2021-03-08 LAB — LIPASE, BLOOD: Lipase: 10 U/L — ABNORMAL LOW (ref 11–51)

## 2021-03-08 LAB — PREGNANCY, URINE: Preg Test, Ur: NEGATIVE

## 2021-03-08 MED ORDER — FENTANYL CITRATE PF 50 MCG/ML IJ SOSY
50.0000 ug | PREFILLED_SYRINGE | Freq: Once | INTRAMUSCULAR | Status: AC
Start: 2021-03-08 — End: 2021-03-08
  Administered 2021-03-08: 50 ug via INTRAVENOUS
  Filled 2021-03-08: qty 1

## 2021-03-08 MED ORDER — SODIUM CHLORIDE 0.9 % IV BOLUS
1000.0000 mL | Freq: Once | INTRAVENOUS | Status: AC
  Administered 2021-03-08: 1000 mL via INTRAVENOUS

## 2021-03-08 MED ORDER — SODIUM CHLORIDE 0.9 % IV SOLN: INTRAVENOUS | Status: DC

## 2021-03-08 MED ORDER — ONDANSETRON HCL 4 MG/2ML IJ SOLN
4.0000 mg | Freq: Once | INTRAMUSCULAR | Status: AC
  Administered 2021-03-08: 4 mg via INTRAVENOUS
  Filled 2021-03-08: qty 2

## 2021-03-08 NOTE — ED Provider Notes (Signed)
[name]    CSN: [id number] Arrival date & time: 03/08/21  0804      History   Chief Complaint Chief Complaint  Patient presents with   Back Pain    HPI Pamela Booker is a 25 y.o. female.   Patient presents with severe right-sided back and flank pain that started yesterday.  Patient reports that she passed a kidney stone yesterday and pain started directly after.  Patient has kidney stone in a plastic bag present with her.  Has chronic kidney stones for approximately 1.5 years now.  Is followed by urology but last saw urology 1.5 years ago.  Patient reports that she never typically has pain after passing the kidney stone.  Also denies pain that was present prior to kidney stone being passed.  Patient denies any urinary burning, urinary frequency, hematuria, vaginal discharge, pelvic pain, abdominal pain.  Patient reports a fever of 100 yesterday.  Pertinent medical history includes epilepsy with vagal neurostimulator placed 3 weeks ago, chronic kidney stones, endometriosis.  States that "she just does not feel well and her kidney stones typically do not feel like this".  Pain is rated 8/10 on the pain scale is described as "stabbing".   Back Pain  Past Medical History:  Diagnosis Date   Anxiety    Encounter for loop recorder check 06/10/2019   Hypertension    Loop: Biotronic loop recorder in situ 05/06/2019   Scheduled Remote loop recorder check 06/09/2019: This represents an unremarkable monitoring session. The presenting rhythm is not available. No symptomatic or automatic episodes were triggered. Daily activity trends are stable. Heart rate variability trends are stable.   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 Authorizing Provider  acebutolol (SECTRAL) 200 MG capsule TAKE ONE CAPSULE BY MOUTH TWICE A DAY DISCONTINUE VERAPAMIL 01/18/21   Cantwell, Celeste C, PA-C  AJOVY 225 MG/1.5ML SOAJ Inject 1.5 mLs into the skin every 30 (thirty) days.  12/11/19   [provider]  butalbital-acetaminophen-caffeine (FIORICET) 50-325-40 MG tablet Take 1-2 tablets by mouth every 6 (six) hours as needed for headache. 12/04/20   Gilda Crease, MD  escitalopram (LEXAPRO) 20 MG tablet Take 20 mg by mouth daily.  08/23/18   [provider]  levETIRAcetam (KEPPRA) 500 MG tablet Take 1 tablet (500 mg total) by mouth 2 (two) times daily. 05/06/20   Gerhard Munch, MD  LORazepam (ATIVAN) 0.5 MG tablet Take 0.5 mg by mouth daily as  needed for anxiety.    [provider]  norethindrone (AYGESTIN) 5 MG tablet Take 5 mg by mouth daily. 02/26/19 05/06/20  [provider]  PRESCRIPTION MEDICATION Apply 1 application topically every other day. Apply to face at night - Hers 20 ml Acne ( pump) Az/Zp (0.0125%)    [provider]  rizatriptan (MAXALT-MLT) 10 MG disintegrating tablet Take 10 mg by mouth daily as needed for migraine.  03/17/19   [provider]  tiZANidine (ZANAFLEX) 2 MG tablet Take 1-2 tablets (2-4 mg total) by mouth every 6 (six) hours as needed for muscle spasms. 10/02/20   Wieters, Hallie C, PA-C  topiramate (TOPAMAX) 100 MG tablet Take 100 mg by mouth daily.    [provider]  topiramate ER  (QUDEXY XR) 50 MG CS24 sprinkle capsule Take 100 mg by mouth daily.  Patient not taking: Reported on 05/06/2020 09/16/18   [provider]  valACYclovir (VALTREX) 500 MG tablet Take 500 mg by mouth daily.    [provider]  omeprazole (PRILOSEC) 20 MG capsule Take 1 capsule (20 mg total) by mouth daily for 14 days. Patient not taking: Reported on 05/06/2020 11/03/19 10/02/20  Darr, Gerilyn Pilgrim, PA-C    Family History Family History  Adopted: Yes  Problem Relation Age of Onset   Healthy Daughter     Social History Social History   Tobacco Use   Smoking status: Never   Smokeless tobacco: Never  Vaping Use   Vaping Use: Never used  Substance Use Topics   Alcohol use: Yes    Comment: socially   Drug use: No     Allergies   Nsaids and Benadryl [diphenhydramine]   Review of Systems Review of Systems Per HPI  Physical Exam Triage Vital Signs ED Triage Vitals [03/08/21 0814]  Enc Vitals Group     BP 123/87     Pulse Rate 84     Resp 14     Temp 98.3 F (36.8 C)     Temp Source Oral     SpO2 98 %     Weight      Height      Head Circumference      Peak Flow      Pain Score      Pain Loc      Pain Edu?      Excl. in GC?    No data found.  Updated Vital Signs BP 123/87 (BP Location: Left Arm)   Pulse 84   Temp 98.3 F (36.8 C) (Oral)   Resp 14   SpO2 98%   Visual Acuity Right Eye Distance:   Left Eye Distance:   Bilateral Distance:    Right Eye Near:   Left Eye Near:    Bilateral Near:     Physical Exam Constitutional:      General: She is not in acute distress.    Appearance: Normal appearance. She is ill-appearing. She is not toxic-appearing or diaphoretic.  HENT:     Head: Normocephalic and atraumatic.  Eyes:     Extraocular Movements: Extraocular movements intact.     Conjunctiva/sclera: Conjunctivae normal.  Cardiovascular:     Rate and Rhythm: Normal rate and regular rhythm.     Pulses: Normal pulses.     Heart sounds: Normal  heart sounds.  Pulmonary:     Effort: Pulmonary effort is normal. No respiratory distress.     Breath sounds: Normal breath sounds.  Abdominal:     General:  Bowel sounds are normal. There is no distension.     Palpations: Abdomen is soft.     Tenderness: There is no abdominal tenderness.  Musculoskeletal:     Cervical back: Normal.     Thoracic back: Normal.     Comments: Tenderness to palpation to right lower back.  Skin:    General: Skin is warm and dry.  Neurological:     General: No focal deficit present.     Mental Status: She is alert and oriented to person, place, and time. Mental status is at baseline.  Psychiatric:        Mood and Affect: Mood normal.        Behavior: Behavior normal.        Thought Content: Thought content normal.        Judgment: Judgment normal.     UC Treatments / Results  Labs (all labs ordered are listed, but only abnormal results are displayed) Labs Reviewed  POCT URINALYSIS DIP (MANUAL ENTRY) - Abnormal; Notable for the following components:      Result Value   Glucose, UA =250 (*)    Ketones, POC UA small (15) (*)    All other components within normal limits    EKG   Radiology No results found.  Procedures Procedures (including critical care time)  Medications Ordered in UC Medications - No data to display  Initial Impression / Assessment and Plan / UC Course  I have reviewed the triage vital signs and the nursing notes.  Pertinent labs & imaging results that were available during my care of the patient were reviewed by me and considered in my medical decision making (see chart for details).     Patient is in severe pain after passing kidney stone which warrants further investigation at the hospital.  Advised patient that she needs to go to the hospital for further evaluation and management.  Patient was agreeable with plan.  Vital signs stable at discharge.  Agree with patient's family member transporting her to the  hospital. Final Clinical Impressions(s) / UC Diagnoses   Final diagnoses:  Flank pain     Discharge Instructions      Please go to the hospital as soon as you leave urgent care for further evaluation and management.     ED Prescriptions   None    I have reviewed the PDMP during this encounter.   Lance Muss, FNP 03/08/21 [phone]

## 2021-03-08 NOTE — ED Notes (Signed)
Patient verbalizes understanding of discharge instructions. Opportunity for questioning and answers were provided. Patient discharged from ED.  °

## 2021-03-08 NOTE — ED Triage Notes (Signed)
Pt arrives to ED with c/o of right sided flank pain that started yesterday after she passed kidney stone. Pt reports she was having hematuria and dysuria x2 days ago due to kidney stone. She reports the stone she passed was large and jagged. The flank pain is sharp and constant.

## 2021-03-08 NOTE — ED Provider Notes (Signed)
[facility] EMERGENCY DEPT Provider Note   CSN: [id number] Arrival date & time: 03/08/21  2423     History Chief Complaint  Patient presents with   Flank Pain    Pamela Booker is a 25 y.o. female.   Flank Pain Associated symptoms include abdominal pain. Pertinent negatives include no chest pain and no shortness of breath.   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  The patient has had hematuria and dysuria for the past 2 days which she attributes to a kidney stone.  She strained her urine and passed a kidney stone yesterday that was irregularly shaped.  Was around 2 to 3 mm in length.  Since then, she has had persistent right-sided abdominal pain and flank pain, sharp and severe, with some chills.  No fevers.  Past Medical History:  Diagnosis Date   Anxiety    Encounter for loop recorder check 06/10/2019   Epilepsy (HCC)    Hypertension    Loop: Biotronic loop recorder in situ 05/06/2019   Scheduled Remote loop recorder check 06/09/2019: This represents an unremarkable monitoring session. The presenting rhythm is not available. No symptomatic or automatic episodes were triggered. Daily activity trends are stable. Heart rate variability trends are stable.   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   PVC (premature ventricular contraction) 03/11/2018   Endometriosis 03/14/2017    Past Surgical History:  Procedure Laterality Date   LAPAROSCOPY     TONSILLECTOMY     VAGUS NERVE STIMULATOR INSERTION       OB History      Gravida  1   Para  0   Term  0   Preterm  0   AB  0   Living         SAB  0   IAB  0   Ectopic  0   Multiple      Live Births              Family History  Adopted: Yes  Problem Relation Age of Onset   Healthy Daughter     Social History   Tobacco Use   Smoking status: Never   Smokeless tobacco: Never  Vaping Use   Vaping Use: Never used  Substance Use Topics   Alcohol use: Yes    Comment: socially   Drug use: No    Home Medications Prior to Admission medications   Medication Sig Start Date End Date Taking? Authorizing Provider  acebutolol (SECTRAL) 200 MG capsule TAKE ONE CAPSULE BY MOUTH TWICE A DAY DISCONTINUE VERAPAMIL 01/18/21  Yes Cantwell, Celeste C, PA-C  escitalopram (LEXAPRO) 20 MG tablet Take 20 mg by mouth daily.  08/23/18  Yes [provider]  levETIRAcetam (KEPPRA) 500 MG tablet Take 1 tablet (500 mg total) by mouth 2 (two) times daily. 05/06/20  Yes Gerhard Munch, MD  topiramate (TOPAMAX) 100 MG tablet Take 100 mg by mouth daily.   Yes [provider]  valACYclovir (VALTREX) 500 MG tablet Take 500 mg by mouth daily.   Yes [provider]  AJOVY 225 MG/1.5ML SOAJ Inject  1.5 mLs into the skin every 30 (thirty) days.  12/11/19   [provider]  buPROPion (WELLBUTRIN XL) 300 MG 24 hr tablet Take 300 mg by mouth every morning. 11/03/20   [provider]  butalbital-acetaminophen-caffeine (FIORICET) 50-325-40 MG tablet Take 1-2 tablets by mouth every 6 (six) hours as needed for headache. 12/04/20   Gilda Crease, MD  LORazepam (ATIVAN) 0.5 MG tablet Take 0.5 mg by mouth daily as needed for anxiety.    [provider]  norethindrone (AYGESTIN) 5 MG tablet Take 5 mg by mouth daily. 02/26/19 05/06/20  [provider]  PRESCRIPTION MEDICATION Apply 1 application topically every other day. Apply to face at night - Hers 20 ml Acne ( pump) Az/Zp (0.0125%)    [provider]   rizatriptan (MAXALT-MLT) 10 MG disintegrating tablet Take 10 mg by mouth daily as needed for migraine.  03/17/19   [provider]  tiZANidine (ZANAFLEX) 2 MG tablet Take 1-2 tablets (2-4 mg total) by mouth every 6 (six) hours as needed for muscle spasms. 10/02/20   Wieters, Hallie C, PA-C  topiramate ER (QUDEXY XR) 50 MG CS24 sprinkle capsule Take 100 mg by mouth daily.  Patient not taking: Reported on 05/06/2020 09/16/18   [provider]  XCOPRI 14 x 150 MG & 14 x200 MG TBPK Take by mouth. 11/11/20   [provider]  omeprazole (PRILOSEC) 20 MG capsule Take 1 capsule (20 mg total) by mouth daily for 14 days. Patient not taking: Reported on 05/06/2020 11/03/19 10/02/20  Darr, Gerilyn Pilgrim, PA-C    Allergies    Nsaids and Benadryl [diphenhydramine]  Review of Systems   Review of Systems  Constitutional:  Positive for chills. Negative for fever.  HENT:  Negative for ear pain and sore throat.   Eyes:  Negative for pain and visual disturbance.  Respiratory:  Negative for cough and shortness of breath.   Cardiovascular:  Negative for chest pain and palpitations.  Gastrointestinal:  Positive for abdominal pain and nausea. Negative for vomiting.  Genitourinary:  Positive for dysuria, flank pain and hematuria.  Musculoskeletal:  Negative for arthralgias and back pain.  Skin:  Negative for color change and rash.  Neurological:  Negative for seizures and syncope.  All other systems reviewed and are negative.  Physical Exam Updated Vital Signs BP 114/62   Pulse 63   Temp 98.3 F (36.8 C) (Oral)   Resp 15   Ht 5\' 2"  (1.575 m)   Wt 68 kg   SpO2 100%   BMI 27.44 kg/m   Physical Exam Vitals and nursing note reviewed.  Constitutional:      General: She is not in acute distress.    Appearance: She is well-developed.  HENT:     Head: Normocephalic and atraumatic.  Eyes:     Conjunctiva/sclera: Conjunctivae normal.     Pupils: Pupils are equal, round, and reactive to  light.  Cardiovascular:     Rate and Rhythm: Normal rate and regular rhythm.     Heart sounds: No murmur heard. Pulmonary:     Effort: Pulmonary effort is normal. No respiratory distress.     Breath sounds: Normal breath sounds.  Abdominal:     General: There is no distension.     Palpations: Abdomen is soft.     Tenderness: There is abdominal tenderness in the right lower quadrant and suprapubic area. There is right CVA tenderness. There is no guarding.  Musculoskeletal:  General: No deformity or signs of injury.     Cervical back: Normal range of motion and neck supple.  Skin:    General: Skin is warm and dry.     Findings: No lesion or rash.  Neurological:     General: No focal deficit present.     Mental Status: She is alert. Mental status is at baseline.    ED Results / Procedures / Treatments   Labs (all labs ordered are listed, but only abnormal results are displayed) Labs Reviewed  LIPASE, BLOOD - Abnormal; Notable for the following components:      Result Value   Lipase 10 (*)    All other components within normal limits  URINALYSIS, ROUTINE W REFLEX MICROSCOPIC - Abnormal; Notable for the following components:   Glucose, UA 250 (*)    Ketones, ur 15 (*)    All other components within normal limits  CBC WITH DIFFERENTIAL/PLATELET  PREGNANCY, URINE  BASIC METABOLIC PANEL  HEPATIC FUNCTION PANEL    EKG None  Radiology CT RENAL STONE STUDY  Result Date: 03/08/2021 CLINICAL DATA:  Right-sided flank pain beginning yesterday. Patient reports passing a kidney stone. Hematuria and dysuria for 2 days. EXAM: CT ABDOMEN AND PELVIS WITHOUT CONTRAST TECHNIQUE: Multidetector CT imaging of the abdomen and pelvis was performed following the standard protocol without IV contrast. COMPARISON:  03/18/2020 FINDINGS: Lower chest: Clear lung bases. Hepatobiliary: No focal liver abnormality is seen. No gallstones, gallbladder wall thickening, or biliary dilatation. Pancreas:  Unremarkable. No pancreatic ductal dilatation or surrounding inflammatory changes. Spleen: Normal in size without focal abnormality. Adrenals/Urinary Tract: No adrenal masses. Kidneys are normal in size, orientation and position. There are bilateral nonobstructing intrarenal stones. No renal masses. No hydronephrosis or perinephric stranding. Ureters are normal in course and in caliber. No ureteral stones. Normal bladder. Stomach/Bowel: Stomach is within normal limits. Appendix appears normal. No evidence of bowel wall thickening, distention, or inflammatory changes. Vascular/Lymphatic: No significant vascular findings are present. No enlarged abdominal or pelvic lymph nodes. Reproductive: Uterus and bilateral adnexa are unremarkable. Other: No abdominal wall hernia or abnormality. No abdominopelvic ascites. Musculoskeletal: No acute or significant osseous findings. IMPRESSION: 1. No acute findings. No ureteral stones or evidence of obstructive uropathy. 2. Bilateral nonobstructing intrarenal stones. No other abnormalities. Electronically Signed   By: Amie Portland M.D.   On: 03/08/2021 11:39    Procedures Procedures   Medications Ordered in ED Medications  sodium chloride 0.9 % bolus 1,000 mL (0 mLs Intravenous Stopped 03/08/21 1243)  fentaNYL (SUBLIMAZE) injection 50 mcg (50 mcg Intravenous Given 03/08/21 1005)  ondansetron (ZOFRAN) injection 4 mg (4 mg Intravenous Given 03/08/21 1005)    ED Course  I have reviewed the triage vital signs and the nursing notes.  Pertinent labs & imaging results that were available during my care of the patient were reviewed by me and considered in my medical decision making (see chart for details).    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  The patient has had hematuria and dysuria for the past 2 days which she attributes to a kidney stone.  She strained her urine and passed a kidney stone yesterday that was  irregularly shaped.  Was around 2 to 3 mm in length.  Since then, she has had persistent right-sided abdominal pain and flank pain, sharp and severe, with some chills.  No fevers.   Pertinent exam findings include:RLQ and suprapubic tenderness to palpation. The patient denies vaginal bleeding and vaginal discharge.   Lab results include: Urine pregnancy negative, BMP and hepatic function panel unremarkable, CBC unremarkable, no leukocytosis. Lipase normal. UA without hematuria or evidence of UTI.  Imaging results include: No acute abnormalities, appendix was visualized as normal. 1. No acute findings. No ureteral stones or evidence of obstructive  uropathy.  2. Bilateral nonobstructing intrarenal stones. No other  abnormalities.    Course of tx has consisted YC:[id number], Zofran, and an IVF bolus.   Thought process: Patient presented to the emergency department with right lower quadrant abdominal pain after being a kidney stone yesterday.  Symptoms of dysuria and hematuria have resolved.  Right lower quadrant abdominal pain is crampy and nonradiating.  She does have some right lower quadrant tenderness palpation on physical exam.  CT abdomen pelvis was performed which revealed no acute findings.  The appendix was visualized to be normal.  No abnormality noted of the adnexa.  Patient denies any vaginal complaints or pelvic pain.  With presenting history and physical exam, doubt Bowel Obstruction, AAA, ACS, pneumonia, pneumothorax, Pyelonephritis, Pancreatitis, Cholecystitis, Shingles, Perforated Bowel or Ulcer, Diverticulosis/itis, Ischemic Mesentery, Inflammatory Bowel Disease, Strangulated/Incarcerated Hernia, gastritis, PUD. Patient without peritoneal signs or other indication of  need for surgical intervention.  Patient does not endorse signs or symptoms consistent with PID or ovarian pathology.  Considered early appendicitis as a possibility. Patient does have a history of endometriosis and symptoms could be consistent. Discussed with the patient her negative workup today and advised continued symptomatic management at home. Advised she return to the ED for repeat evaluation and possible further imaging in the event of worsening symptoms. Pt in agreement.   Final Clinical Impression(s) / ED Diagnoses Final diagnoses:  RLQ abdominal pain    Rx / DC Orders ED Discharge Orders     None        Ernie Avena, MD 03/09/21 [phone]

## 2021-03-08 NOTE — Discharge Instructions (Addendum)
Please go to the hospital as soon as you leave urgent care for further evaluation and management. 

## 2021-03-08 NOTE — Discharge Instructions (Addendum)
You were evaluated in the emergency department for abdominal pain after passing a kidney stone.  Your work-up today has been overall reassuring with a CT scan that was negative for acute findings.  Your appendix was visualized as normal.  He did not have a white count and are not febrile.  Here vitals are reassuring.  On exam, you have tenderness in the right lower quadrant which is in the vicinity of where your appendix is.  This could be early appendicitis.  Alternatively, this pain could be residual from the kidney stone you recently passed.  There is no evidence of new kidney stones.  Your ovaries and uterus were visualized as normal.  In the event of worsening nausea and vomiting, development of fever and chills, inability to tolerate oral intake, worsening right lower quadrant abdominal pain, please return to the emergency department for repeat evaluation.

## 2021-03-08 NOTE — ED Triage Notes (Addendum)
Hx of kidney stones. Passed one yesterday, states since she's had extremely bad back pain. "Most times after it passes the pain goes away, but I'm still having extremely bad back pain and just don't feel well." Denies hematuria today. Reports her urine is dark, foul smelling, and cloudy. Pain mid/lower back, mostly right sided.  States this stone was different than her other stones. Just had a vagus nerve stimulator implanted 3 weeks ago to help with her epilepsy, states she wasn't sure if the surgery caused the new stone.

## 2021-03-14 ENCOUNTER — Ambulatory Visit: Payer: BC Managed Care – PPO | Admitting: Student

## 2021-03-14 NOTE — Progress Notes (Deleted)
Primary Physician/Referring:  Pcp, No  Patient ID: Pamela Booker, female    DOB: Nov 24, 1995, 25 y.o.   MRN: [id number]  No chief complaint on file.  HPI:    Pamela Booker  is a 25 y.o. Caucasian female patient with history of complex migraines, occasional PVCs and palpitations, episodes of syncope and near syncope since March 2020.  She had a loop recorded implanted 05/06/2019 to evaluate syncopal episodes. Upon neurological evaluation was diagnosed with complex migraines. With no further indication for monitoring loop recorder explantation done on 12/12/2019.  Patient was seen 02/12/2020 at which time she was having symptoms suspicious for COVID-19, she was advised to follow-up with PCP and to see [place] on a as needed basis.  After that visit she reached out to the office indicating her heart rate was elevated and she was having more frequent palpitations.  Therefore switched her from verapamil to acebutolol.  She now presents for 1 year follow-up.***  ***  She was seen 07/14/2019 with musculoskeletal chest pain and given as needed tramadol.  She presents today with complaints of fatigue, dizziness, dyspnea on exertion starting 2 days ago. Also reports 2 days of mild non-exertional chest pain that is intermittent and lasting about 30 minutes at a time.  Denies cough, fever, chills, urinary frequency or urgency.  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  Her most recent negative COVID-19 test was 2 weeks ago.  Past Medical History:  Diagnosis Date   Anxiety    Encounter for loop recorder check 06/10/2019   Epilepsy (HCC)    Hypertension    Loop: Biotronic loop recorder in situ 05/06/2019   Scheduled Remote loop recorder check 06/09/2019: This represents an unremarkable monitoring session. The presenting rhythm is not available. No symptomatic or automatic episodes were triggered. Daily activity trends are stable. Heart  rate variability trends are stable.   Migraines    Palpitations    PVC (premature ventricular contraction)    PVC's (premature ventricular contractions)    Seizures (HCC)    Syncope and collapse 06/10/2019   Past Surgical History:  Procedure Laterality Date   LAPAROSCOPY     TONSILLECTOMY     VAGUS NERVE STIMULATOR INSERTION     Family History  Adopted: Yes  Problem Relation Age of Onset   Healthy Daughter     Social History   Tobacco Use   Smoking status: Never   Smokeless tobacco: Never  Substance Use Topics   Alcohol use: Yes    Comment: socially   Marital Status: Divorced   ROS  Review of Systems  Constitutional: Positive for malaise/fatigue. Negative for fever.  Cardiovascular:  Positive for chest pain (non exertional ), dyspnea on exertion and palpitations (chronic). Negative for leg swelling, orthopnea, paroxysmal nocturnal dyspnea and syncope.  Respiratory:  Negative for cough, snoring and wheezing.   Neurological:  Positive for dizziness.   Objective  unknown if currently breastfeeding.  Vitals with BMI 03/08/2021 03/08/2021 03/08/2021  Height - - 5\' 2"   Weight - - 150 lbs  BMI - - 27.43  Systolic 114 120 -  Diastolic 62 74 -  Pulse 63 72 -      Physical Exam Constitutional:      Comments: Patient appears fatigued.   Cardiovascular:     Rate and Rhythm: Normal rate and regular rhythm.     Pulses: Normal pulses.          Carotid  pulses are 2+ on the right side and 2+ on the left side.      Radial pulses are 2+ on the right side and 2+ on the left side.       Dorsalis pedis pulses are 2+ on the right side and 2+ on the left side.       Posterior tibial pulses are 2+ on the right side and 2+ on the left side.     Heart sounds: Normal heart sounds. No murmur heard.   No friction rub. No gallop.     Comments: No edema.  Pulmonary:     Effort: Pulmonary effort is normal.     Breath sounds: No wheezing, rhonchi or rales.  Abdominal:     General: Bowel  sounds are normal.     Palpations: Abdomen is soft.     Tenderness: There is no abdominal tenderness. There is no right CVA tenderness or left CVA tenderness.  Skin:    General: Skin is warm.    Laboratory examination:   Recent Labs    05/06/20 1142 12/04/20 0314 03/08/21 0959  NA 138 141 140  K 3.6 3.3* 3.9  CL 106 110 105  CO2 20* 24 26  GLUCOSE 83 119* 79  BUN 18 13 7   CREATININE 0.76 0.60 0.69  CALCIUM 9.8 8.5* 9.9  GFRNONAA >60 >60 >60    estimated creatinine clearance is 95.5 mL/min (by C-G formula based on SCr of 0.69 mg/dL).  CMP Latest Ref Rng & Units 03/08/2021 12/04/2020 05/06/2020  Glucose 70 - 99 mg/dL 79 14/07/2019) 83  BUN 6 - 20 mg/dL 7 13 18   Creatinine 0.44 - 1.00 mg/dL 376(E 8.31  Sodium 135 - 145 mmol/L 140 141 138  Potassium 3.5 - 5.1 mmol/L 3.9 3.3(L) 3.6  Chloride 98 - 111 mmol/L 105 110 106  CO2 22 - 32 mmol/L 26 24 20(L)  Calcium 8.9 - 10.3 mg/dL 9.9 5.17) 9.8  Total Protein 6.5 - 8.1 g/dL 7.2 - 7.5  Total Bilirubin 0.3 - 1.2 mg/dL 0.5 - 1.0  Alkaline Phos 38 - 126 U/L 49 - 44  AST 15 - 41 U/L 15 - 33  ALT 0 - 44 U/L 18 - 50(H)   CBC Latest Ref Rng & Units 03/08/2021 12/04/2020 05/06/2020  WBC 4.0 - 10.5 K/uL 6.2 6.1 9.7  Hemoglobin 12.0 - 15.0 g/dL 02/04/2021 14/07/2019 15.4(H)  Hematocrit 36.0 - 46.0 % 41.8 39.7 46.0  Platelets 150 - 400 K/uL 273 241 344    Lipid Panel No results for input(s): CHOL, TRIG, LDLCALC, VLDL, HDL, CHOLHDL, LDLDIRECT in the last 8760 hours.  HEMOGLOBIN A1C Lab Results  Component Value Date   HGBA1C  07/16/2010    5.1 (NOTE)                                                                       According to the ADA Clinical Practice Recommendations for 2011, when HbA1c is used as a screening test:   >=6.5%   Diagnostic of Diabetes Mellitus           (if abnormal result  is confirmed)  5.7-6.4%   Increased risk of developing Diabetes Mellitus  References:Diagnosis and Classification of Diabetes Mellitus,Diabetes  Care,2011,34(Suppl  1):S62-S69 and Standards of Medical Care in         Diabetes - 2011,Diabetes Care,2011,34  (Suppl 1):S11-S61.   MPG 100 07/16/2010   TSH No results for input(s): TSH in the last 8760 hours.  External labs:  03/31/2019:  HDL 45.000  Cholesterol, total 180.000  Triglycerides 87.000   Glucose Random 73.000  BUN 7.000  Creatinine, Serum 0.700  TSH 0.923   08/07/2017:  LDL 76.000  Allergies   Allergies  Allergen Reactions   Nsaids Other (See Comments)    History of bleeding ulcers   Benadryl [Diphenhydramine] Other (See Comments)    "Crawling feeling under skin"      Medications Prior to Visit:   Outpatient Medications Prior to Visit  Medication Sig Dispense Refill   acebutolol (SECTRAL) 200 MG capsule TAKE ONE CAPSULE BY MOUTH TWICE A DAY DISCONTINUE VERAPAMIL 60 capsule 0   AJOVY 225 MG/1.5ML SOAJ Inject 1.5 mLs into the skin every 30 (thirty) days.      buPROPion (WELLBUTRIN XL) 300 MG 24 hr tablet Take 300 mg by mouth every morning.     butalbital-acetaminophen-caffeine (FIORICET) 50-325-40 MG tablet Take 1-2 tablets by mouth every 6 (six) hours as needed for headache. 10 tablet 0   escitalopram (LEXAPRO) 20 MG tablet Take 20 mg by mouth daily.      levETIRAcetam (KEPPRA) 500 MG tablet Take 1 tablet (500 mg total) by mouth 2 (two) times daily. 60 tablet 0   LORazepam (ATIVAN) 0.5 MG tablet Take 0.5 mg by mouth daily as needed for anxiety.     norethindrone (AYGESTIN) 5 MG tablet Take 5 mg by mouth daily.     PRESCRIPTION MEDICATION Apply 1 application topically every other day. Apply to face at night - Hers 20 ml Acne ( pump) Az/Zp (0.0125%)     rizatriptan (MAXALT-MLT) 10 MG disintegrating tablet Take 10 mg by mouth daily as needed for migraine.      tiZANidine (ZANAFLEX) 2 MG tablet Take 1-2 tablets (2-4 mg total) by mouth every 6 (six) hours as needed for muscle spasms. 30 tablet 0   topiramate (TOPAMAX) 100 MG tablet Take 100 mg by mouth daily.      topiramate ER (QUDEXY XR) 50 MG CS24 sprinkle capsule Take 100 mg by mouth daily.  (Patient not taking: Reported on 05/06/2020)     valACYclovir (VALTREX) 500 MG tablet Take 500 mg by mouth daily.     XCOPRI 14 x 150 MG & 14 x200 MG TBPK Take by mouth.     No facility-administered medications prior to visit.   Final Medications at End of Visit    No outpatient medications have been marked as taking for the 03/14/21 encounter (Appointment) with Carlynn Purl,  C, PA-C.   Radiology:   No results found. Mr Pamela Booker Wo Contrast 04/15/2019 Normal examination. No abnormality seen to explain the clinical presentation. No imaging sign of previous closed head injury.    Cardiac Studies:   48 hour holter monitor 10/02/2018: NSR. Occasional PVC's (6.2%). No SVT or A fib was noted.    Echocardiogram 10/30/2018: Normal LV systolic function with EF 55%. Left ventricle cavity is normal in size. Normal left ventricular wall thickness. Normal  global wall motion. Normal diastolic filling pattern. Calculated EF 55%. Left atrial cavity is normal in size. Intact interatrial septum. No shunting seen on agitated saline study.  Scheduled Remote loop recorder check 07/08/2019: This represents an unremarkable monitoring session. There were 6 symptomatic patient activations recorded (chest  pain, palpitations and tingling). A review of the episode detail shows this was consistent with sinus rhythm and rare PVCs.  EKG: ***  EKG 02/12/2020: Sinus tachycardia at a rate of 103 bpm with single PVC.  Normal axis.  No evidence of ischemia.   EKG 07/14/2019: Normal sinus rhythm with rate of 85 bpm, normal axis.  Incomplete right bundle block.  Single PVC.  Normal exam.    Assessment   No diagnosis found.    There are no discontinued medications.   No orders of the defined types were placed in this encounter.   Recommendations:   Pamela Booker is a 25 y.o. Caucasian female patient with history of  complex migraines, occasional PVCs and palpitations, episodes of syncope and near syncope since March 2020.  She had a loop recorded implanted 05/06/2019 to evaluate syncopal episodes. Upon neurological evaluation was diagnosed with complex migraines. With no further indication for monitoring loop recorder explantation done on 12/12/2019.  Patient was seen 02/12/2020 at which time she was having symptoms suspicious for COVID-19, she was advised to follow-up with PCP and to see [place] on a as needed basis.  After that visit she reached out to the office indicating her heart rate was elevated and she was having more frequent palpitations.  Therefore switched her from verapamil to acebutolol.  She now presents for 1 year follow-up.***  ***  Patient presents today with acute complaint of of 2-day history of fatigue, dyspnea on exertion, intermittent nonexertional chest pain.  Patient's EKG today shows sinus tachycardia with single PVC, no evidence of ischemia.  Orthostatic vitals did not reveal orthostatic hypotension. Physical exam is normal and unchanged from last visit.   In light of reassuring EKG and physical exam, as well as new onset of symptoms, suspect patient's symptoms are likely due to an acute bacterial or viral infection. Did a point of care COVID-19 test which was negative today in the office.  However this test is not highly sensitive, patient may need formal testing.  Suspect symptoms could be due to underlying urinary tract infection as patient has passed 2 kidney stones in the last few weeks.  Will order urinalysis to evaluate.  Discussed with patient that if urinalysis comes back negative would recommend she should get formal COVID-19 testing done.  Patient is in agreement.   We will follow up on as-needed basis.  Patient was seen in collaboration with Dr. Jacinto Halim. He also reviewed patient's chart and examined the patient. Dr. Jacinto Halim is in agreement of the plan.   During this visit I reviewed and  updated: Tobacco history  allergies medication reconciliation  medical history  surgical history  family history  social history.  This note was created using a voice recognition software as a result there may be grammatical errors inadvertently enclosed that do not reflect the nature of this encounter. Every attempt is made to correct such errors.   Rayford Halsted, PA-C 03/14/2021, 8:25 AM Office: [phone]

## 2021-03-15 DIAGNOSIS — F4310 Post-traumatic stress disorder, unspecified: Secondary | ICD-10-CM | POA: Diagnosis not present

## 2021-03-15 NOTE — Progress Notes (Deleted)
Primary Physician/Referring:  Pcp, No  Patient ID: Pamela Booker, female    DOB: Nov 24, 1995, 25 y.o.   MRN: [id number]  No chief complaint on file.  HPI:    Pamela Booker  is a 25 y.o. Caucasian female patient with history of complex migraines, occasional PVCs and palpitations, episodes of syncope and near syncope since March 2020.  She had a loop recorded implanted 05/06/2019 to evaluate syncopal episodes. Upon neurological evaluation was diagnosed with complex migraines. With no further indication for monitoring loop recorder explantation done on 12/12/2019.  Patient was seen 02/12/2020 at which time she was having symptoms suspicious for COVID-19, she was advised to follow-up with PCP and to see [place] on a as needed basis.  After that visit she reached out to the office indicating her heart rate was elevated and she was having more frequent palpitations.  Therefore switched her from verapamil to acebutolol.  She now presents for 1 year follow-up.***  ***  She was seen 07/14/2019 with musculoskeletal chest pain and given as needed tramadol.  She presents today with complaints of fatigue, dizziness, dyspnea on exertion starting 2 days ago. Also reports 2 days of mild non-exertional chest pain that is intermittent and lasting about 30 minutes at a time.  Denies cough, fever, chills, urinary frequency or urgency.  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  Her most recent negative COVID-19 test was 2 weeks ago.  Past Medical History:  Diagnosis Date   Anxiety    Encounter for loop recorder check 06/10/2019   Epilepsy (HCC)    Hypertension    Loop: Biotronic loop recorder in situ 05/06/2019   Scheduled Remote loop recorder check 06/09/2019: This represents an unremarkable monitoring session. The presenting rhythm is not available. No symptomatic or automatic episodes were triggered. Daily activity trends are stable. Heart  rate variability trends are stable.   Migraines    Palpitations    PVC (premature ventricular contraction)    PVC's (premature ventricular contractions)    Seizures (HCC)    Syncope and collapse 06/10/2019   Past Surgical History:  Procedure Laterality Date   LAPAROSCOPY     TONSILLECTOMY     VAGUS NERVE STIMULATOR INSERTION     Family History  Adopted: Yes  Problem Relation Age of Onset   Healthy Daughter     Social History   Tobacco Use   Smoking status: Never   Smokeless tobacco: Never  Substance Use Topics   Alcohol use: Yes    Comment: socially   Marital Status: Divorced   ROS  Review of Systems  Constitutional: Positive for malaise/fatigue. Negative for fever.  Cardiovascular:  Positive for chest pain (non exertional ), dyspnea on exertion and palpitations (chronic). Negative for leg swelling, orthopnea, paroxysmal nocturnal dyspnea and syncope.  Respiratory:  Negative for cough, snoring and wheezing.   Neurological:  Positive for dizziness.   Objective  unknown if currently breastfeeding.  Vitals with BMI 03/08/2021 03/08/2021 03/08/2021  Height - - 5\' 2"   Weight - - 150 lbs  BMI - - 27.43  Systolic 114 120 -  Diastolic 62 74 -  Pulse 63 72 -      Physical Exam Constitutional:      Comments: Patient appears fatigued.   Cardiovascular:     Rate and Rhythm: Normal rate and regular rhythm.     Pulses: Normal pulses.          Carotid  pulses are 2+ on the right side and 2+ on the left side.      Radial pulses are 2+ on the right side and 2+ on the left side.       Dorsalis pedis pulses are 2+ on the right side and 2+ on the left side.       Posterior tibial pulses are 2+ on the right side and 2+ on the left side.     Heart sounds: Normal heart sounds. No murmur heard.   No friction rub. No gallop.     Comments: No edema.  Pulmonary:     Effort: Pulmonary effort is normal.     Breath sounds: No wheezing, rhonchi or rales.  Abdominal:     General: Bowel  sounds are normal.     Palpations: Abdomen is soft.     Tenderness: There is no abdominal tenderness. There is no right CVA tenderness or left CVA tenderness.  Skin:    General: Skin is warm.    Laboratory examination:   Recent Labs    05/06/20 1142 12/04/20 0314 03/08/21 0959  NA 138 141 140  K 3.6 3.3* 3.9  CL 106 110 105  CO2 20* 24 26  GLUCOSE 83 119* 79  BUN 18 13 7   CREATININE 0.76 0.60 0.69  CALCIUM 9.8 8.5* 9.9  GFRNONAA >60 >60 >60    estimated creatinine clearance is 95.5 mL/min (by C-G formula based on SCr of 0.69 mg/dL).  CMP Latest Ref Rng & Units 03/08/2021 12/04/2020 05/06/2020  Glucose 70 - 99 mg/dL 79 14/07/2019) 83  BUN 6 - 20 mg/dL 7 13 18   Creatinine 0.44 - 1.00 mg/dL 580(D 9.83  Sodium 135 - 145 mmol/L 140 141 138  Potassium 3.5 - 5.1 mmol/L 3.9 3.3(L) 3.6  Chloride 98 - 111 mmol/L 105 110 106  CO2 22 - 32 mmol/L 26 24 20(L)  Calcium 8.9 - 10.3 mg/dL 9.9 3.82) 9.8  Total Protein 6.5 - 8.1 g/dL 7.2 - 7.5  Total Bilirubin 0.3 - 1.2 mg/dL 0.5 - 1.0  Alkaline Phos 38 - 126 U/L 49 - 44  AST 15 - 41 U/L 15 - 33  ALT 0 - 44 U/L 18 - 50(H)   CBC Latest Ref Rng & Units 03/08/2021 12/04/2020 05/06/2020  WBC 4.0 - 10.5 K/uL 6.2 6.1 9.7  Hemoglobin 12.0 - 15.0 g/dL 02/04/2021 14/07/2019 15.4(H)  Hematocrit 36.0 - 46.0 % 41.8 39.7 46.0  Platelets 150 - 400 K/uL 273 241 344    Lipid Panel No results for input(s): CHOL, TRIG, LDLCALC, VLDL, HDL, CHOLHDL, LDLDIRECT in the last 8760 hours.  HEMOGLOBIN A1C Lab Results  Component Value Date   HGBA1C  07/16/2010    5.1 (NOTE)                                                                       According to the ADA Clinical Practice Recommendations for 2011, when HbA1c is used as a screening test:   >=6.5%   Diagnostic of Diabetes Mellitus           (if abnormal result  is confirmed)  5.7-6.4%   Increased risk of developing Diabetes Mellitus  References:Diagnosis and Classification of Diabetes Mellitus,Diabetes  Care,2011,34(Suppl  1):S62-S69 and Standards of Medical Care in         Diabetes - 2011,Diabetes Care,2011,34  (Suppl 1):S11-S61.   MPG 100 07/16/2010   TSH No results for input(s): TSH in the last 8760 hours.  External labs:  03/31/2019:  HDL 45.000  Cholesterol, total 180.000  Triglycerides 87.000   Glucose Random 73.000  BUN 7.000  Creatinine, Serum 0.700  TSH 0.923   08/07/2017:  LDL 76.000  Allergies   Allergies  Allergen Reactions   Nsaids Other (See Comments)    History of bleeding ulcers   Benadryl [Diphenhydramine] Other (See Comments)    "Crawling feeling under skin"      Medications Prior to Visit:   Outpatient Medications Prior to Visit  Medication Sig Dispense Refill   acebutolol (SECTRAL) 200 MG capsule TAKE ONE CAPSULE BY MOUTH TWICE A DAY DISCONTINUE VERAPAMIL 60 capsule 0   AJOVY 225 MG/1.5ML SOAJ Inject 1.5 mLs into the skin every 30 (thirty) days.      buPROPion (WELLBUTRIN XL) 300 MG 24 hr tablet Take 300 mg by mouth every morning.     butalbital-acetaminophen-caffeine (FIORICET) 50-325-40 MG tablet Take 1-2 tablets by mouth every 6 (six) hours as needed for headache. 10 tablet 0   escitalopram (LEXAPRO) 20 MG tablet Take 20 mg by mouth daily.      levETIRAcetam (KEPPRA) 500 MG tablet Take 1 tablet (500 mg total) by mouth 2 (two) times daily. 60 tablet 0   LORazepam (ATIVAN) 0.5 MG tablet Take 0.5 mg by mouth daily as needed for anxiety.     norethindrone (AYGESTIN) 5 MG tablet Take 5 mg by mouth daily.     PRESCRIPTION MEDICATION Apply 1 application topically every other day. Apply to face at night - Hers 20 ml Acne ( pump) Az/Zp (0.0125%)     rizatriptan (MAXALT-MLT) 10 MG disintegrating tablet Take 10 mg by mouth daily as needed for migraine.      tiZANidine (ZANAFLEX) 2 MG tablet Take 1-2 tablets (2-4 mg total) by mouth every 6 (six) hours as needed for muscle spasms. 30 tablet 0   topiramate (TOPAMAX) 100 MG tablet Take 100 mg by mouth daily.      topiramate ER (QUDEXY XR) 50 MG CS24 sprinkle capsule Take 100 mg by mouth daily.  (Patient not taking: Reported on 05/06/2020)     valACYclovir (VALTREX) 500 MG tablet Take 500 mg by mouth daily.     XCOPRI 14 x 150 MG & 14 x200 MG TBPK Take by mouth.     No facility-administered medications prior to visit.   Final Medications at End of Visit    No outpatient medications have been marked as taking for the 03/16/21 encounter (Appointment) with Carlynn Purl,  C, PA-C.   Radiology:   No results found. Mr Laqueta Jean Wo Contrast 04/15/2019 Normal examination. No abnormality seen to explain the clinical presentation. No imaging sign of previous closed head injury.    Cardiac Studies:   48 hour holter monitor 10/02/2018: NSR. Occasional PVC's (6.2%). No SVT or A fib was noted.    Echocardiogram 10/30/2018: Normal LV systolic function with EF 55%. Left ventricle cavity is normal in size. Normal left ventricular wall thickness. Normal  global wall motion. Normal diastolic filling pattern. Calculated EF 55%. Left atrial cavity is normal in size. Intact interatrial septum. No shunting seen on agitated saline study.  Scheduled Remote loop recorder check 07/08/2019: This represents an unremarkable monitoring session. There were 6 symptomatic patient activations recorded (chest  pain, palpitations and tingling). A review of the episode detail shows this was consistent with sinus rhythm and rare PVCs.  EKG: ***  EKG 02/12/2020: Sinus tachycardia at a rate of 103 bpm with single PVC.  Normal axis.  No evidence of ischemia.   EKG 07/14/2019: Normal sinus rhythm with rate of 85 bpm, normal axis.  Incomplete right bundle block.  Single PVC.  Normal exam.    Assessment   No diagnosis found.    There are no discontinued medications.   No orders of the defined types were placed in this encounter.   Recommendations:   Pamela Booker is a 25 y.o. Caucasian female patient with history of  complex migraines, occasional PVCs and palpitations, episodes of syncope and near syncope since March 2020.  She had a loop recorded implanted 05/06/2019 to evaluate syncopal episodes. Upon neurological evaluation was diagnosed with complex migraines. With no further indication for monitoring loop recorder explantation done on 12/12/2019.  Patient was seen 02/12/2020 at which time she was having symptoms suspicious for COVID-19, she was advised to follow-up with PCP and to see [place] on a as needed basis.  After that visit she reached out to the office indicating her heart rate was elevated and she was having more frequent palpitations.  Therefore switched her from verapamil to acebutolol.  She now presents for 1 year follow-up.***  ***  Patient presents today with acute complaint of of 2-day history of fatigue, dyspnea on exertion, intermittent nonexertional chest pain.  Patient's EKG today shows sinus tachycardia with single PVC, no evidence of ischemia.  Orthostatic vitals did not reveal orthostatic hypotension. Physical exam is normal and unchanged from last visit.   In light of reassuring EKG and physical exam, as well as new onset of symptoms, suspect patient's symptoms are likely due to an acute bacterial or viral infection. Did a point of care COVID-19 test which was negative today in the office.  However this test is not highly sensitive, patient may need formal testing.  Suspect symptoms could be due to underlying urinary tract infection as patient has passed 2 kidney stones in the last few weeks.  Will order urinalysis to evaluate.  Discussed with patient that if urinalysis comes back negative would recommend she should get formal COVID-19 testing done.  Patient is in agreement.   We will follow up on as-needed basis.  Patient was seen in collaboration with Dr. Jacinto Halim. He also reviewed patient's chart and examined the patient. Dr. Jacinto Halim is in agreement of the plan.   During this visit I reviewed and  updated: Tobacco history  allergies medication reconciliation  medical history  surgical history  family history  social history.  This note was created using a voice recognition software as a result there may be grammatical errors inadvertently enclosed that do not reflect the nature of this encounter. Every attempt is made to correct such errors.   Rayford Halsted, PA-C 03/15/2021, 1:43 PM Office: [phone]

## 2021-03-16 ENCOUNTER — Ambulatory Visit: Payer: BC Managed Care – PPO | Admitting: Student

## 2021-03-16 DIAGNOSIS — R002 Palpitations: Secondary | ICD-10-CM

## 2021-03-17 DIAGNOSIS — G43909 Migraine, unspecified, not intractable, without status migrainosus: Secondary | ICD-10-CM | POA: Diagnosis not present

## 2021-03-17 DIAGNOSIS — G40209 Localization-related (focal) (partial) symptomatic epilepsy and epileptic syndromes with complex partial seizures, not intractable, without status epilepticus: Secondary | ICD-10-CM | POA: Diagnosis not present

## 2021-03-17 DIAGNOSIS — R413 Other amnesia: Secondary | ICD-10-CM | POA: Diagnosis not present

## 2021-03-17 DIAGNOSIS — R2689 Other abnormalities of gait and mobility: Secondary | ICD-10-CM | POA: Diagnosis not present

## 2021-03-21 DIAGNOSIS — F4310 Post-traumatic stress disorder, unspecified: Secondary | ICD-10-CM | POA: Diagnosis not present

## 2021-03-30 ENCOUNTER — Ambulatory Visit: Payer: No Typology Code available for payment source | Admitting: Nurse Practitioner

## 2021-04-04 ENCOUNTER — Other Ambulatory Visit: Payer: Self-pay | Admitting: Student

## 2021-04-04 DIAGNOSIS — R002 Palpitations: Secondary | ICD-10-CM

## 2021-04-07 DIAGNOSIS — G43909 Migraine, unspecified, not intractable, without status migrainosus: Secondary | ICD-10-CM | POA: Diagnosis not present

## 2021-04-07 DIAGNOSIS — G40209 Localization-related (focal) (partial) symptomatic epilepsy and epileptic syndromes with complex partial seizures, not intractable, without status epilepticus: Secondary | ICD-10-CM | POA: Diagnosis not present

## 2021-04-12 ENCOUNTER — Other Ambulatory Visit: Payer: Self-pay | Admitting: Student

## 2021-04-12 DIAGNOSIS — R002 Palpitations: Secondary | ICD-10-CM

## 2021-04-25 DIAGNOSIS — J101 Influenza due to other identified influenza virus with other respiratory manifestations: Secondary | ICD-10-CM | POA: Diagnosis not present

## 2021-04-30 ENCOUNTER — Other Ambulatory Visit: Payer: Self-pay | Admitting: Student

## 2021-04-30 DIAGNOSIS — R002 Palpitations: Secondary | ICD-10-CM

## 2021-05-19 DIAGNOSIS — G43909 Migraine, unspecified, not intractable, without status migrainosus: Secondary | ICD-10-CM | POA: Diagnosis not present

## 2021-05-19 DIAGNOSIS — G40209 Localization-related (focal) (partial) symptomatic epilepsy and epileptic syndromes with complex partial seizures, not intractable, without status epilepticus: Secondary | ICD-10-CM | POA: Diagnosis not present

## 2021-05-19 DIAGNOSIS — R413 Other amnesia: Secondary | ICD-10-CM | POA: Diagnosis not present

## 2021-05-19 DIAGNOSIS — R2689 Other abnormalities of gait and mobility: Secondary | ICD-10-CM | POA: Diagnosis not present

## 2021-05-31 ENCOUNTER — Other Ambulatory Visit: Payer: Self-pay

## 2021-05-31 DIAGNOSIS — R002 Palpitations: Secondary | ICD-10-CM

## 2021-05-31 MED ORDER — ACEBUTOLOL HCL 200 MG PO CAPS: ORAL_CAPSULE | ORAL | 0 refills | Status: DC

## 2021-06-01 ENCOUNTER — Encounter (HOSPITAL_BASED_OUTPATIENT_CLINIC_OR_DEPARTMENT_OTHER): Payer: Self-pay

## 2021-06-01 ENCOUNTER — Other Ambulatory Visit: Payer: Self-pay

## 2021-06-01 ENCOUNTER — Emergency Department (HOSPITAL_BASED_OUTPATIENT_CLINIC_OR_DEPARTMENT_OTHER): Payer: BC Managed Care – PPO

## 2021-06-01 ENCOUNTER — Emergency Department (HOSPITAL_BASED_OUTPATIENT_CLINIC_OR_DEPARTMENT_OTHER)
Admission: EM | Admit: 2021-06-01 | Discharge: 2021-06-01 | Disposition: A | Discharge status: Home / Self Care | Payer: BC Managed Care – PPO | Attending: Emergency Medicine | Admitting: Emergency Medicine

## 2021-06-01 DIAGNOSIS — I10 Essential (primary) hypertension: Secondary | ICD-10-CM | POA: Insufficient documentation

## 2021-06-01 DIAGNOSIS — G40909 Epilepsy, unspecified, not intractable, without status epilepticus: Secondary | ICD-10-CM | POA: Insufficient documentation

## 2021-06-01 DIAGNOSIS — R079 Chest pain, unspecified: Secondary | ICD-10-CM | POA: Diagnosis not present

## 2021-06-01 DIAGNOSIS — R569 Unspecified convulsions: Secondary | ICD-10-CM

## 2021-06-01 DIAGNOSIS — Z20822 Contact with and (suspected) exposure to covid-19: Secondary | ICD-10-CM | POA: Diagnosis not present

## 2021-06-01 LAB — COMPREHENSIVE METABOLIC PANEL
ALT: 31 U/L (ref 0–44)
AST: 21 U/L (ref 15–41)
Albumin: 4.8 g/dL (ref 3.5–5.0)
Alkaline Phosphatase: 48 U/L (ref 38–126)
Anion gap: 10 (ref 5–15)
BUN: 7 mg/dL (ref 6–20)
CO2: 25 mmol/L (ref 22–32)
Calcium: 9.3 mg/dL (ref 8.9–10.3)
Chloride: 104 mmol/L (ref 98–111)
Creatinine, Ser: 0.73 mg/dL (ref 0.44–1.00)
GFR, Estimated: >60 mL/min (ref >60)
Glucose, Bld: 86 mg/dL (ref 70–99)
Potassium: 3.7 mmol/L (ref 3.5–5.1)
Sodium: 139 mmol/L (ref 135–145)
Total Bilirubin: 0.4 mg/dL (ref 0.3–1.2)
Total Protein: 7.1 g/dL (ref 6.5–8.1)

## 2021-06-01 LAB — RESP PANEL BY RT-PCR (FLU A&B, COVID) ARPGX2
Influenza A by PCR: NEGATIVE
Influenza B by PCR: NEGATIVE
SARS Coronavirus 2 by RT PCR: NEGATIVE

## 2021-06-01 LAB — CBC WITH DIFFERENTIAL/PLATELET
Abs Immature Granulocytes: 0.02 10*3/uL (ref 0.00–0.07)
Basophils Absolute: 0 10*3/uL (ref 0.0–0.1)
Basophils Relative: 1 %
Eosinophils Absolute: 0.1 10*3/uL (ref 0.0–0.5)
Eosinophils Relative: 1 %
HCT: 42.6 % (ref 36.0–46.0)
Hemoglobin: 14.5 g/dL (ref 12.0–15.0)
Immature Granulocytes: 0 %
Lymphocytes Relative: 52 %
Lymphs Abs: 3.9 10*3/uL (ref 0.7–4.0)
MCH: 33.9 pg (ref 26.0–34.0)
MCHC: 34 g/dL (ref 30.0–36.0)
MCV: 99.5 fL (ref 80.0–100.0)
Monocytes Absolute: 0.5 10*3/uL (ref 0.1–1.0)
Monocytes Relative: 7 %
Neutro Abs: 2.9 10*3/uL (ref 1.7–7.7)
Neutrophils Relative %: 39 %
Platelets: 264 10*3/uL (ref 150–400)
RBC: 4.28 MIL/uL (ref 3.87–5.11)
RDW: 12.5 % (ref 11.5–15.5)
WBC: 7.5 10*3/uL (ref 4.0–10.5)
nRBC: 0 % (ref 0.0–0.2)

## 2021-06-01 LAB — PREGNANCY, URINE: Preg Test, Ur: NEGATIVE

## 2021-06-01 MED ORDER — FENTANYL CITRATE PF 50 MCG/ML IJ SOSY
50.0000 ug | PREFILLED_SYRINGE | Freq: Once | INTRAMUSCULAR | Status: AC
  Administered 2021-06-01: 20:00:00 50 ug via INTRAVENOUS
  Filled 2021-06-01: qty 1

## 2021-06-01 MED ORDER — LEVETIRACETAM IN NACL 1000 MG/100ML IV SOLN
1000.0000 mg | Freq: Once | INTRAVENOUS | Status: AC
  Administered 2021-06-01: 20:00:00 1000 mg via INTRAVENOUS
  Filled 2021-06-01: qty 100

## 2021-06-01 NOTE — Discharge Instructions (Signed)
Follow-up with your neurologist.  The work-up for the chest pain was otherwise reassuring.

## 2021-06-01 NOTE — ED Notes (Signed)
(  L) (R) seizure pads attached to patients bed

## 2021-06-01 NOTE — ED Triage Notes (Signed)
Pt presents to the ED with friend. This RN called for patient and when getting a wheelchair for the pt, pt began to seize. Pt has a hx of epilepsy and has a biotronic loop recorder as well as a vagus nerve stimulator. Pt's friend states that she has been taking her seizure medication as prescribed. Pt's friend states that she originally came here for [facility] and CP. Pt's seizure activity lasted about 10 minutes. Roomed to room 10.

## 2021-06-01 NOTE — ED Provider Notes (Signed)
[facility] EMERGENCY DEPT Provider Note   CSN: [id number] Arrival date & time: 06/01/21  9163     History Chief Complaint  Patient presents with   Seizures    Velecia Ovitt is a 25 y.o. female. Level 5 caveat due to altered mental status.  Seizures Patient presents with chest pain.  Reportedly has had a sharp chest pain at the site of her vagus nerve stimulator.  However while in the waiting room had a seizure.  Reportedly tonic-clonic.  Reportedly had some chest pain.  Patient now awake.  Stuttering which appears to be something happens after her seizures.  States she gets managed in [id number] [facility] neurology.  Is on Keppra, Xcopri, and Topamax.  States last seizure was about a week ago.  Also has vagus nerve stimulator.  No fevers.  Patient claims compliance with her medications    Past Medical History:  Diagnosis Date   Anxiety    Encounter for loop recorder check 06/10/2019   Epilepsy (HCC)    Hypertension    Loop: Biotronic loop recorder in situ 05/06/2019   Scheduled Remote loop recorder check 06/09/2019: This represents an unremarkable monitoring session. The presenting rhythm is not available. No symptomatic or automatic episodes were triggered. Daily activity trends are stable. Heart rate variability trends are stable.   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   PVC (premature ventricular contraction) 03/11/2018   Endometriosis 03/14/2017    Past Surgical History:  Procedure Laterality Date   LAPAROSCOPY     TONSILLECTOMY     VAGUS NERVE STIMULATOR INSERTION       OB History     Gravida  1   Para  0   Term  0   Preterm  0   AB  0   Living         SAB  0   IAB  0   Ectopic  0   Multiple      Live Births               Family History  Adopted: Yes  Problem Relation Age of Onset   Healthy Daughter     Social History   Tobacco Use   Smoking status: Never   Smokeless tobacco: Never  Vaping Use   Vaping Use: Never used  Substance Use Topics   Alcohol use: Yes    Comment: socially   Drug use: No    Home Medications Prior to Admission medications   Medication Sig Start Date End Date Taking? Authorizing Provider  acebutolol (SECTRAL) 200 MG capsule TAKE ONE CAPSULE BY MOUTH TWICE A DAY DISCONTINUE VERAPAMIL 05/31/21   Cantwell, Celeste C, PA-C  AJOVY 225 MG/1.5ML SOAJ Inject 1.5 mLs into the skin every 30 (thirty) days.  12/11/19   [provider]  buPROPion (WELLBUTRIN XL) 300 MG 24 hr tablet Take 300 mg by mouth every morning. 11/03/20   [provider]  butalbital-acetaminophen-caffeine (FIORICET) 50-325-40 MG tablet Take 1-2 tablets by mouth every 6 (six) hours as needed for headache. 12/04/20   Gilda Crease, MD  escitalopram (LEXAPRO) 20 MG tablet Take 20 mg by mouth daily.  08/23/18   [provider]  levETIRAcetam (KEPPRA) 500 MG tablet Take 1 tablet (500 mg  total) by mouth 2 (two) times daily. 05/06/20   Gerhard Munch, MD  LORazepam (ATIVAN) 0.5 MG tablet Take 0.5 mg by mouth daily as needed for anxiety.    [provider]  norethindrone (AYGESTIN) 5 MG tablet Take 5 mg by mouth daily. 02/26/19 05/06/20  [provider]  PRESCRIPTION MEDICATION Apply 1 application topically every other day. Apply to face at night - Hers 20 ml Acne ( pump) Az/Zp (0.0125%)    [provider]  rizatriptan (MAXALT-MLT) 10 MG disintegrating tablet Take 10 mg by mouth daily as needed for migraine.  03/17/19   [provider]  tiZANidine (ZANAFLEX) 2 MG tablet Take 1-2 tablets (2-4 mg total) by mouth every 6 (six) hours as needed for muscle spasms. 10/02/20   Wieters, Hallie C, PA-C  topiramate (TOPAMAX) 100 MG tablet Take 100 mg by  mouth daily.    [provider]  topiramate ER (QUDEXY XR) 50 MG CS24 sprinkle capsule Take 100 mg by mouth daily.  Patient not taking: Reported on 05/06/2020 09/16/18   [provider]  valACYclovir (VALTREX) 500 MG tablet Take 500 mg by mouth daily.    [provider]  XCOPRI 14 x 150 MG & 14 x200 MG TBPK Take by mouth. 11/11/20   [provider]  omeprazole (PRILOSEC) 20 MG capsule Take 1 capsule (20 mg total) by mouth daily for 14 days. Patient not taking: Reported on 05/06/2020 11/03/19 10/02/20  Darr, Gerilyn Pilgrim, PA-C    Allergies    Nsaids, Benadryl [diphenhydramine], and Ibuprofen  Review of Systems   Review of Systems  Unable to perform ROS: Mental status change  Constitutional:  Negative for appetite change.  Respiratory:  Positive for shortness of breath.   Cardiovascular:  Positive for chest pain.  Neurological:  Positive for seizures.   Physical Exam Updated Vital Signs BP (!) 112/58 (BP Location: Right Arm)    Pulse 65    Temp 98.1 F (36.7 C) (Oral)    Resp 17    Ht 5\' 2"  (1.575 m)    Wt 68 kg    SpO2 97%    BMI 27.44 kg/m   Physical Exam Vitals and nursing note reviewed.  HENT:     Head: Atraumatic.  Eyes:     Extraocular Movements: Extraocular movements intact.     Pupils: Pupils are equal, round, and reactive to light.  Cardiovascular:     Rate and Rhythm: Regular rhythm.  Pulmonary:     Comments: Mild tenderness left anterior chest wall.  Scar from vagal nerve stimulator. Chest:     Chest wall: Tenderness present.  Abdominal:     Tenderness: There is no abdominal tenderness.  Musculoskeletal:     Cervical back: Neck supple.  Neurological:     Mental Status: She is alert.     Comments: Awake and pleasant.  Somewhat slow to answer.  Stuttering.  Moving all extremities.  May have some confusion.    ED Results / Procedures / Treatments   Labs (all labs ordered are listed, but only abnormal results are displayed) Labs Reviewed   RESP PANEL BY RT-PCR (FLU A&B, COVID) [id number]  PREGNANCY, URINE  COMPREHENSIVE METABOLIC PANEL  CBC WITH DIFFERENTIAL/PLATELET  LEVETIRACETAM LEVEL    EKG None ED ECG REPORT   Date: 06/01/2021  Rate: 68  Rhythm: normal sinus rhythm  QRS Axis: normal  Intervals: normal  ST/T Wave abnormalities: normal  Conduction Disutrbances:none  Narrative Interpretation:   Old EKG Reviewed: changes noted  I have personally reviewed the EKG tracing and agree with the computerized printout as noted.  Radiology DG Chest Portable 1 View  Result Date: 06/01/2021 CLINICAL DATA:  Status post seizure. EXAM: PORTABLE CHEST 1 VIEW COMPARISON:  January 18, 2015 FINDINGS: The heart size and mediastinal contours are within normal limits. Multiple radiopaque cardiac lead wires are seen overlying the left lung. Both lungs are otherwise clear. A radiopaque stimulator device and associated stimulator wire are seen just below the level of the mid left clavicle. The visualized skeletal structures are unremarkable. IMPRESSION: No active disease. Electronically Signed   By: Aram Candela M.D.   On: 06/01/2021 20:10    Procedures Procedures   Medications Ordered in ED Medications  levETIRAcetam (KEPPRA) IVPB 1000 mg/100 mL premix (0 mg Intravenous Stopped 06/01/21 2030)  fentaNYL (SUBLIMAZE) injection 50 mcg (50 mcg Intravenous Given 06/01/21 2026)    ED Course  I have reviewed the triage vital signs and the nursing notes.  Pertinent labs & imaging results that were available during my care of the patient were reviewed by me and considered in my medical decision making (see chart for details).    MDM Rules/Calculators/A&P                         Patient with seizure.  History of same.  Has vagus nerve stimulator and is on 3 different seizure medicines.  Some Keppra given here.  Back to baseline.  Last seizure was about a week ago.  They are titrating up her vagus nerve stimulator.  Came in for chest  pain however.  Work-up reassuring.  X-ray reassuring.  Doubt cardiac ischemia as a cause.  Doubt arrhythmia as a cause of syncope/seizure.  Patient will follow with her neurologist to adjust her medicines and nerve stimulator.  Will discharge home    Final Clinical Impression(s) / ED Diagnoses Final diagnoses:  Seizure (HCC)  Nonspecific chest pain    Rx / DC Orders ED Discharge Orders     None        Benjiman Core, MD 06/02/21 0006

## 2021-06-01 NOTE — ED Notes (Signed)
Mom Okey Dupre Ala Dach) [phone] called requesting a status update. Info given to [employer]

## 2021-06-02 ENCOUNTER — Ambulatory Visit: Payer: Self-pay | Admitting: Student

## 2021-06-02 ENCOUNTER — Encounter: Payer: Self-pay | Admitting: Student

## 2021-06-02 VITALS — BP 112/83 | HR 72 | Temp 98.0°F | Resp 16 | Ht 62.0 in | Wt 157.0 lb

## 2021-06-02 DIAGNOSIS — R002 Palpitations: Secondary | ICD-10-CM

## 2021-06-02 MED ORDER — ACEBUTOLOL HCL 200 MG PO CAPS: ORAL_CAPSULE | ORAL | 3 refills | Status: DC

## 2021-06-02 NOTE — Progress Notes (Signed)
Primary Physician/Referring:  Pcp, No  Patient ID: Pamela Booker, female    DOB: 1996/02/14, 25 y.o.   MRN: [id number]  Chief Complaint  Patient presents with   Dizziness   PVCs   Palpitations   Shortness of Breath   HPI:    Pamela Booker  is a 25 y.o. Caucasian female patient with history of complex migraines, occasional PVCs and palpitations, episodes of syncope and near syncope since March 2020.  She had a loop recorded implanted 05/06/2019 to evaluate syncopal episodes. Upon neurological evaluation was diagnosed with complex migraines. With no further indication for monitoring loop recorder explantation done on 12/12/2019.  Patient presents for annual follow-up of palpitations.  At last office visit patient was switched from verapamil to a acebutolol.  Patient reports a spewed Will has significantly improved her symptoms of palpitation.  She does continue to have occasional episodes but they are better and brief.  She is currently struggling with recent epilepsy diagnosis and vagal nerve stimulator implant done in September 2022.  She was seen in the emergency department yesterday for seizures again.  She is scheduled to follow-up with neurology next month.  She is otherwise asymptomatic from a cardiovascular standpoint.  Past Medical History:  Diagnosis Date   Anxiety    Encounter for loop recorder check 06/10/2019   Epilepsy ([place])    Hypertension    Loop: Biotronic loop recorder in situ 05/06/2019   Scheduled Remote loop recorder check 06/09/2019: This represents an unremarkable monitoring session. The presenting rhythm is not available. No symptomatic or automatic episodes were triggered. Daily activity trends are stable. Heart rate variability trends are stable.   Migraines    Palpitations    PVC (premature ventricular contraction)    PVC's (premature ventricular contractions)    Seizures (HCC)    Syncope and collapse 06/10/2019   Past Surgical History:   Procedure Laterality Date   LAPAROSCOPY     TONSILLECTOMY     VAGUS NERVE STIMULATOR INSERTION     Family History  Adopted: Yes  Problem Relation Age of Onset   Healthy Daughter     Social History   Tobacco Use   Smoking status: Never   Smokeless tobacco: Never  Substance Use Topics   Alcohol use: Yes    Comment: socially   Marital Status: Divorced   ROS  Review of Systems  Constitutional: Negative for malaise/fatigue and weight gain.  Cardiovascular:  Positive for palpitations (occasional). Negative for chest pain, claudication, leg swelling, near-syncope, orthopnea, paroxysmal nocturnal dyspnea and syncope.  Respiratory:  Negative for shortness of breath.   Neurological:  Negative for dizziness.   Objective  Blood pressure 112/83, pulse 72, temperature 98 F (36.7 C), resp. rate 16, height 5\' 2"  (1.575 m), weight 157 lb (71.2 kg), SpO2 99 %, unknown if currently breastfeeding.  Vitals with BMI 06/02/2021 06/01/2021 06/01/2021  Height 5\' 2"  - -  Weight 157 lbs - -  BMI [id number] - -  Systolic [id number]  Diastolic 83 58 69  Pulse 72 65 76     Physical Exam Vitals reviewed.  Cardiovascular:     Rate and Rhythm: Normal rate and regular rhythm.     Pulses: Normal pulses.          Carotid pulses are 2+ on the right side and 2+ on the left side.      Radial pulses are 2+ on the right side and 2+ on the left side.  Dorsalis pedis pulses are 2+ on the right side and 2+ on the left side.       Posterior tibial pulses are 2+ on the right side and 2+ on the left side.     Heart sounds: Normal heart sounds. No murmur heard.   No friction rub. No gallop.     Comments: No edema.  Pulmonary:     Effort: Pulmonary effort is normal.     Breath sounds: No wheezing, rhonchi or rales.   Laboratory examination:   Recent Labs    12/04/20 0314 03/08/21 0959 06/01/21 1927  NA 141 140 139  K 3.3* 3.9 3.7  CL 110 105 104  CO2 24 26 25   GLUCOSE 119* 79 86  BUN 13 7 7    CREATININE 0.60 0.69 0.73  CALCIUM 8.5* 9.9 9.3  GFRNONAA >60 >60 >60   estimated creatinine clearance is 97.6 mL/min (by C-G formula based on SCr of 0.73 mg/dL).  CMP Latest Ref Rng & Units 06/01/2021 03/08/2021 12/04/2020  Glucose 70 - 99 mg/dL 86 79 05/08/2021)  BUN 6 - 20 mg/dL 7 7 13   Creatinine 0.44 - 1.00 mg/dL 02/04/2021 967(E  Sodium 135 - 145 mmol/L 139 140 141  Potassium 3.5 - 5.1 mmol/L 3.7 3.9 3.3(L)  Chloride 98 - 111 mmol/L 104 105 110  CO2 22 - 32 mmol/L 25 26 24   Calcium 8.9 - 10.3 mg/dL 9.3 9.9 9.38)  Total Protein 6.5 - 8.1 g/dL 7.1 7.2 -  Total Bilirubin 0.3 - 1.2 mg/dL 0.4 0.5 -  Alkaline Phos 38 - 126 U/L 48 49 -  AST 15 - 41 U/L 21 15 -  ALT 0 - 44 U/L 31 18 -   CBC Latest Ref Rng & Units 06/01/2021 03/08/2021 12/04/2020  WBC 4.0 - 10.5 K/uL 7.5 6.2 6.1  Hemoglobin 12.0 - 15.0 g/dL 0.2(H 06/03/2021 05/08/2021  Hematocrit 36.0 - 46.0 % 42.6 41.8 39.7  Platelets 150 - 400 K/uL 264 273 241    Lipid Panel No results for input(s): CHOL, TRIG, LDLCALC, VLDL, HDL, CHOLHDL, LDLDIRECT in the last 8760 hours.  HEMOGLOBIN A1C Lab Results  Component Value Date   HGBA1C  07/16/2010    5.1 (NOTE)                                                                       According to the ADA Clinical Practice Recommendations for 2011, when HbA1c is used as a screening test:   >=6.5%   Diagnostic of Diabetes Mellitus           (if abnormal result  is confirmed)  5.7-6.4%   Increased risk of developing Diabetes Mellitus  References:Diagnosis and Classification of Diabetes Mellitus,Diabetes Care,2011,34(Suppl 1):S62-S69 and Standards of Medical Care in         Diabetes - 2011,Diabetes Care,2011,34  (Suppl 1):S11-S61.   MPG 100 07/16/2010   TSH No results for input(s): TSH in the last 8760 hours.  External labs:  03/31/2019:  HDL 45.000  Cholesterol, total 180.000  Triglycerides 87.000   Glucose Random 73.000  BUN 7.000  Creatinine, Serum 0.700  TSH 0.923   08/07/2017:  LDL 76.000    Allergies   Allergies  Allergen Reactions   Nsaids  Other (See Comments)    History of bleeding ulcers   Benadryl [Diphenhydramine] Other (See Comments)    "Crawling feeling under skin"   Ibuprofen     Other reaction(s): GI Upset (intolerance)    Medications Prior to Visit:   Outpatient Medications Prior to Visit  Medication Sig Dispense Refill   buPROPion (WELLBUTRIN XL) 300 MG 24 hr tablet Take 300 mg by mouth every morning.     butalbital-acetaminophen-caffeine (FIORICET) 50-325-40 MG tablet Take 1-2 tablets by mouth every 6 (six) hours as needed for headache. 10 tablet 0   diazepam (VALIUM) 5 MG tablet Take 5 mg by mouth 2 (two) times daily as needed.     escitalopram (LEXAPRO) 20 MG tablet Take 20 mg by mouth daily.      estradiol (VIVELLE-DOT) 0.075 MG/24HR Place onto the skin.     levETIRAcetam (KEPPRA) 500 MG tablet Take 1 tablet (500 mg total) by mouth 2 (two) times daily. 60 tablet 0   norethindrone (AYGESTIN) 5 MG tablet Take 5 mg by mouth daily.     PRESCRIPTION MEDICATION Apply 1 application topically every other day. Apply to face at night - Hers 20 ml Acne ( pump) Az/Zp (0.0125%)     rizatriptan (MAXALT-MLT) 10 MG disintegrating tablet Take 10 mg by mouth daily as needed for migraine.      topiramate (TOPAMAX) 100 MG tablet Take 100 mg by mouth daily.     topiramate ER (QUDEXY XR) 50 MG CS24 sprinkle capsule Take 100 mg by mouth daily.     valACYclovir (VALTREX) 500 MG tablet Take 500 mg by mouth daily.     XCOPRI 14 x 150 MG & 14 x200 MG TBPK Take by mouth.     acebutolol (SECTRAL) 200 MG capsule TAKE ONE CAPSULE BY MOUTH TWICE A DAY DISCONTINUE VERAPAMIL 60 capsule 0   AJOVY 225 MG/1.5ML SOAJ Inject 1.5 mLs into the skin every 30 (thirty) days.      LORazepam (ATIVAN) 0.5 MG tablet Take 0.5 mg by mouth daily as needed for anxiety.     tiZANidine (ZANAFLEX) 2 MG tablet Take 1-2 tablets (2-4 mg total) by mouth every 6 (six) hours as needed for muscle spasms. 30 tablet  0   No facility-administered medications prior to visit.   Final Medications at End of Visit    Current Meds  Medication Sig   buPROPion (WELLBUTRIN XL) 300 MG 24 hr tablet Take 300 mg by mouth every morning.   butalbital-acetaminophen-caffeine (FIORICET) 50-325-40 MG tablet Take 1-2 tablets by mouth every 6 (six) hours as needed for headache.   diazepam (VALIUM) 5 MG tablet Take 5 mg by mouth 2 (two) times daily as needed.   escitalopram (LEXAPRO) 20 MG tablet Take 20 mg by mouth daily.    estradiol (VIVELLE-DOT) 0.075 MG/24HR Place onto the skin.   levETIRAcetam (KEPPRA) 500 MG tablet Take 1 tablet (500 mg total) by mouth 2 (two) times daily.   norethindrone (AYGESTIN) 5 MG tablet Take 5 mg by mouth daily.   PRESCRIPTION MEDICATION Apply 1 application topically every other day. Apply to face at night - Hers 20 ml Acne ( pump) Az/Zp (0.0125%)   rizatriptan (MAXALT-MLT) 10 MG disintegrating tablet Take 10 mg by mouth daily as needed for migraine.    topiramate (TOPAMAX) 100 MG tablet Take 100 mg by mouth daily.   topiramate ER (QUDEXY XR) 50 MG CS24 sprinkle capsule Take 100 mg by mouth daily.   valACYclovir (VALTREX) 500 MG tablet Take 500  mg by mouth daily.   XCOPRI 14 x 150 MG & 14 x200 MG TBPK Take by mouth.   [DISCONTINUED] acebutolol (SECTRAL) 200 MG capsule TAKE ONE CAPSULE BY MOUTH TWICE A DAY DISCONTINUE VERAPAMIL   Radiology:   DG Chest Portable 1 View  Result Date: 06/01/2021 CLINICAL DATA:  Status post seizure. EXAM: PORTABLE CHEST 1 VIEW COMPARISON:  January 18, 2015 FINDINGS: The heart size and mediastinal contours are within normal limits. Multiple radiopaque cardiac lead wires are seen overlying the left lung. Both lungs are otherwise clear. A radiopaque stimulator device and associated stimulator wire are seen just below the level of the mid left clavicle. The visualized skeletal structures are unremarkable. IMPRESSION: No active disease. Electronically Signed   By:  Virgina Norfolk M.D.   On: 06/01/2021 20:10   Mr Jeri Cos [id number] Contrast   Result Date: 04/15/2019 CLINICAL DATA:  Near syncopal episodes since May of this year. Tingling of the face and arms. Previous close head injury 2018. EXAM: MRI HEAD WITHOUT AND WITH CONTRAST TECHNIQUE: Multiplanar, multiecho pulse sequences of the brain and surrounding structures were obtained without and with intravenous contrast. CONTRAST:  80mL MULTIHANCE GADOBENATE DIMEGLUMINE 529 MG/ML IV SOLN COMPARISON:  Head CT 03/11/2019.  MRI 11/12/2014. FINDINGS: Brain: The brain has a normal appearance without evidence of malformation, atrophy, old or acute small or large vessel infarction, mass lesion, hemorrhage, hydrocephalus or extra-axial collection. After contrast administration, no abnormal enhancement occurs. Vascular: Major vessels at the base of the brain show flow. Venous sinuses appear patent. Skull and upper cervical spine: Normal. Sinuses/Orbits: Clear/normal. Other: None significant. IMPRESSION: Normal examination. No abnormality seen to explain the clinical presentation. No imaging sign of previous closed head injury. Electronically Signed   By: Nelson Chimes M.D.   On: 04/15/2019 09:02  Cardiac Studies:   48 hour holter monitor 10/02/2018: NSR. Occasional PVC's (6.2%). No SVT or A fib was noted.    Echocardiogram 10/30/2018: Normal LV systolic function with EF 55%. Left ventricle cavity is normal in size. Normal left ventricular wall thickness. Normal  global wall motion. Normal diastolic filling pattern. Calculated EF 55%. Left atrial cavity is normal in size. Intact interatrial septum. No shunting seen on agitated saline study.  Scheduled Remote loop recorder check 07/08/2019: This represents an unremarkable monitoring session. There were 6 symptomatic patient activations recorded (chest pain, palpitations and tingling). A review of the episode detail shows this was consistent with sinus rhythm and rare  PVCs.  EKG: 06/02/2021: Sinus rhythm at a rate of 66 bpm.  Normal axis.  Nonspecific T wave abnormality.  No evidence of ischemia  EKG 07/14/2019: Normal sinus rhythm with rate of 85 bpm, normal axis.  Incomplete right bundle block.  Single PVC.  Normal exam.    Assessment     ICD-10-CM   1. Palpitations  R00.2 EKG 12-Lead    acebutolol (SECTRAL) 200 MG capsule       Medications Discontinued During This Encounter  Medication Reason   tiZANidine (ZANAFLEX) 2 MG tablet    LORazepam (ATIVAN) 0.5 MG tablet    AJOVY 225 MG/1.5ML SOAJ    acebutolol (SECTRAL) 200 MG capsule Reorder    Meds ordered this encounter  Medications   acebutolol (SECTRAL) 200 MG capsule    Sig: TAKE ONE CAPSULE BY MOUTH TWICE A DAY DISCONTINUE VERAPAMIL    Dispense:  60 capsule    Refill:  3    Patient needs an appointment to receive anymore refills.  Recommendations:   Tosca Brandis is a 25 y.o. Caucasian female patient with chronic migraines, occasional PVCs and palpitations, episodes of questionable TIAs versus complex migraine, episodes of syncope and near syncope since March 2020 with extensive evaluation from neurology, has Bi[employer]ik loop recorder implantation performed on 05/06/2019, called me yesterday for frequent episodes of chest pain that started about 3 to 4 days ago and associated with dyspnea.  I brought him to be evaluated further.   Patient presents for annual follow-up of palpitations.  At last office visit patient was switched from verapamil to a acebutolol.  Since last office visit patient has been diagnosed with epilepsy and is following closely with neurology.  Palpitations are well controlled on acebutolol.  Physical exam and EKG are stable and blood pressure is well controlled.  Patient is stable from a cardiovascular standpoint.  Follow up in 1 year, sooner if needed.    Alethia Berthold, PA-C 06/02/2021, 3:09 PM Office: [phone]

## 2021-06-03 LAB — LEVETIRACETAM LEVEL: Levetiracetam Lvl: <2 ug/mL — ABNORMAL LOW (ref 10.0–40.0)

## 2021-07-04 ENCOUNTER — Inpatient Hospital Stay (HOSPITAL_COMMUNITY)
Admission: EM | Admit: 2021-07-04 | Discharge: 2021-07-06 | DRG: 101 | Disposition: A | Discharge status: Home / Self Care | Payer: BC Managed Care – PPO | Attending: Family Medicine | Admitting: Family Medicine

## 2021-07-04 ENCOUNTER — Emergency Department (HOSPITAL_COMMUNITY): Payer: Self-pay

## 2021-07-04 ENCOUNTER — Encounter (HOSPITAL_COMMUNITY): Payer: Self-pay

## 2021-07-04 DIAGNOSIS — Z87442 Personal history of urinary calculi: Secondary | ICD-10-CM

## 2021-07-04 DIAGNOSIS — E778 Other disorders of glycoprotein metabolism: Secondary | ICD-10-CM | POA: Diagnosis present

## 2021-07-04 DIAGNOSIS — Z79899 Other long term (current) drug therapy: Secondary | ICD-10-CM

## 2021-07-04 DIAGNOSIS — G40919 Epilepsy, unspecified, intractable, without status epilepticus: Secondary | ICD-10-CM | POA: Diagnosis present

## 2021-07-04 DIAGNOSIS — G43909 Migraine, unspecified, not intractable, without status migrainosus: Secondary | ICD-10-CM | POA: Diagnosis present

## 2021-07-04 DIAGNOSIS — Z91410 Personal history of adult physical and sexual abuse: Secondary | ICD-10-CM

## 2021-07-04 DIAGNOSIS — Z886 Allergy status to analgesic agent status: Secondary | ICD-10-CM

## 2021-07-04 DIAGNOSIS — I10 Essential (primary) hypertension: Secondary | ICD-10-CM | POA: Diagnosis present

## 2021-07-04 DIAGNOSIS — F319 Bipolar disorder, unspecified: Secondary | ICD-10-CM | POA: Diagnosis present

## 2021-07-04 DIAGNOSIS — G40909 Epilepsy, unspecified, not intractable, without status epilepticus: Principal | ICD-10-CM

## 2021-07-04 DIAGNOSIS — F329 Major depressive disorder, single episode, unspecified: Secondary | ICD-10-CM | POA: Diagnosis present

## 2021-07-04 DIAGNOSIS — Z87820 Personal history of traumatic brain injury: Secondary | ICD-10-CM

## 2021-07-04 DIAGNOSIS — Z888 Allergy status to other drugs, medicaments and biological substances status: Secondary | ICD-10-CM

## 2021-07-04 DIAGNOSIS — O09299 Supervision of pregnancy with other poor reproductive or obstetric history, unspecified trimester: Secondary | ICD-10-CM | POA: Diagnosis present

## 2021-07-04 DIAGNOSIS — R569 Unspecified convulsions: Secondary | ICD-10-CM

## 2021-07-04 LAB — COMPREHENSIVE METABOLIC PANEL
ALT: 45 U/L — ABNORMAL HIGH (ref 0–44)
AST: 27 U/L (ref 15–41)
Albumin: 4.2 g/dL (ref 3.5–5.0)
Alkaline Phosphatase: 59 U/L (ref 38–126)
Anion gap: 7 (ref 5–15)
BUN: 8 mg/dL (ref 6–20)
CO2: 28 mmol/L (ref 22–32)
Calcium: 9.8 mg/dL (ref 8.9–10.3)
Chloride: 105 mmol/L (ref 98–111)
Creatinine, Ser: 0.67 mg/dL (ref 0.44–1.00)
GFR, Estimated: >60 mL/min (ref >60)
Glucose, Bld: 83 mg/dL (ref 70–99)
Potassium: 3.9 mmol/L (ref 3.5–5.1)
Sodium: 140 mmol/L (ref 135–145)
Total Bilirubin: 0.5 mg/dL (ref 0.3–1.2)
Total Protein: 6.6 g/dL (ref 6.5–8.1)

## 2021-07-04 LAB — PREGNANCY, URINE: Preg Test, Ur: NEGATIVE

## 2021-07-04 LAB — URINALYSIS, ROUTINE W REFLEX MICROSCOPIC
Bilirubin Urine: NEGATIVE
Glucose, UA: NEGATIVE mg/dL
Hgb urine dipstick: NEGATIVE
Ketones, ur: NEGATIVE mg/dL
Nitrite: NEGATIVE
Protein, ur: NEGATIVE mg/dL
Specific Gravity, Urine: 1.01 (ref 1.005–1.030)
pH: 7.5 (ref 5.0–8.0)

## 2021-07-04 LAB — CBC
HCT: 43.4 % (ref 36.0–46.0)
Hemoglobin: 14.8 g/dL (ref 12.0–15.0)
MCH: 34.1 pg — ABNORMAL HIGH (ref 26.0–34.0)
MCHC: 34.1 g/dL (ref 30.0–36.0)
MCV: 100 fL (ref 80.0–100.0)
Platelets: 238 10*3/uL (ref 150–400)
RBC: 4.34 MIL/uL (ref 3.87–5.11)
RDW: 11.7 % (ref 11.5–15.5)
WBC: 5.4 10*3/uL (ref 4.0–10.5)
nRBC: 0 % (ref 0.0–0.2)

## 2021-07-04 LAB — URINALYSIS, MICROSCOPIC (REFLEX)

## 2021-07-04 MED ORDER — SODIUM CHLORIDE 0.9 % IV BOLUS
500.0000 mL | Freq: Once | INTRAVENOUS | Status: AC
  Administered 2021-07-04: 500 mL via INTRAVENOUS

## 2021-07-04 MED ORDER — ONDANSETRON HCL 4 MG/2ML IJ SOLN
4.0000 mg | Freq: Once | INTRAMUSCULAR | Status: AC
  Administered 2021-07-04: 4 mg via INTRAVENOUS
  Filled 2021-07-04: qty 2

## 2021-07-04 MED ORDER — LORAZEPAM 2 MG/ML IJ SOLN
INTRAMUSCULAR | Status: AC
  Filled 2021-07-04: qty 1

## 2021-07-04 MED ORDER — LORAZEPAM 2 MG/ML IJ SOLN
2.0000 mg | Freq: Once | INTRAMUSCULAR | Status: AC
  Administered 2021-07-04: 2 mg via INTRAVENOUS

## 2021-07-04 NOTE — ED Notes (Signed)
Pt had second seizure
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
## 2021-07-04 NOTE — ED Triage Notes (Signed)
Pt comes via [facility] from home, had a seizure in her bathroom and hit her head, unwitnessed, hx of the same, pt has a vagal stimulator, PTA pt received 5mg  versed IM by EMS. Now AxO x 4

## 2021-07-04 NOTE — ED Provider Notes (Signed)
[facility] EMERGENCY DEPARTMENT Provider Note   CSN: [id number] Arrival date & time: 07/04/21  1913     History  Chief Complaint  Patient presents with   Seizures    Danijah Ave is a 26 y.o. female.   Seizures Patient presents with seizure.  History of same.  Last seizure was before this 1 was this morning.  Reportedly had a seizure at home and hit her head.  States she has been not doing as well at home.  States she feels like she has a cold.  Also a passing kidney stone.  Is on few different seizure medicines managed in [place].  Also has vagus nerve stimulator.   Past Medical History:  Diagnosis Date   Anxiety    Encounter for loop recorder check 06/10/2019   Epilepsy ([place])    Hypertension    Loop: Biotronic loop recorder in situ 05/06/2019   Scheduled Remote loop recorder check 06/09/2019: This represents an unremarkable monitoring session. The presenting rhythm is not available. No symptomatic or automatic episodes were triggered. Daily activity trends are stable. Heart rate variability trends are stable.   Migraines    Palpitations    PVC (premature ventricular contraction)    PVC's (premature ventricular contractions)    Seizures (HCC)    Syncope and collapse 06/10/2019    Home Medications Prior to Admission medications   Medication Sig Start Date End Date Taking? Authorizing Provider  acebutolol (SECTRAL) 200 MG capsule TAKE ONE CAPSULE BY MOUTH TWICE A DAY DISCONTINUE VERAPAMIL 06/02/21   Cantwell, Celeste C, PA-C  buPROPion (WELLBUTRIN XL) 300 MG 24 hr tablet Take 300 mg by mouth every morning. 11/03/20   [provider]  butalbital-acetaminophen-caffeine (FIORICET) 50-325-40 MG tablet Take 1-2 tablets by mouth every 6 (six) hours as needed for headache. 12/04/20   Orpah Greek, MD  diazepam (VALIUM) 5 MG tablet Take 5 mg by mouth 2 (two) times daily as needed. 05/31/21   [provider]  escitalopram  (LEXAPRO) 20 MG tablet Take 20 mg by mouth daily.  08/23/18   [provider]  estradiol (VIVELLE-DOT) 0.075 MG/24HR Place onto the skin. 03/02/21   [provider]  levETIRAcetam (KEPPRA) 500 MG tablet Take 1 tablet (500 mg total) by mouth 2 (two) times daily. 05/06/20   Carmin Muskrat, MD  norethindrone (AYGESTIN) 5 MG tablet Take 5 mg by mouth daily. 02/26/19 06/02/21  [provider]  PRESCRIPTION MEDICATION Apply 1 application topically every other day. Apply to face at night - Hers 20 ml Acne ( pump) Az/Zp (0.0125%)    [provider]  rizatriptan (MAXALT-MLT) 10 MG disintegrating tablet Take 10 mg by mouth daily as needed for migraine.  03/17/19   [provider]  topiramate (TOPAMAX) 100 MG tablet Take 100 mg by mouth daily.    [provider]  topiramate ER (QUDEXY XR) 50 MG CS24 sprinkle capsule Take 100 mg by mouth daily. 09/16/18   [provider]  valACYclovir (VALTREX) 500 MG tablet Take 500 mg by mouth daily.    [provider]  XCOPRI 14 x 150 MG & 14 x200 MG TBPK Take by mouth. 11/11/20   [provider]  omeprazole (PRILOSEC) 20 MG capsule Take 1 capsule (20 mg total) by mouth daily for 14 days. Patient not taking: Reported on 05/06/2020 11/03/19 10/02/20  Darr, Edison Nasuti, PA-C      Allergies    Nsaids, Benadryl [diphenhydramine], Ibuprofen, and Emgality [galcanezumab-gnlm]  Review of Systems   Review of Systems  Constitutional:  Positive for fatigue. Negative for appetite change and fever.  Respiratory:  Negative for shortness of breath.   Gastrointestinal:  Negative for abdominal pain.  Musculoskeletal:  Positive for myalgias.  Neurological:  Positive for seizures.   Physical Exam Updated Vital Signs BP (!) 106/52    Pulse 84    Temp 98.5 F (36.9 C) (Oral)    Resp 19    SpO2 97%  Physical Exam Vitals and nursing note reviewed.  HENT:     Head: Normocephalic.  Eyes:     Pupils: Pupils are  equal, round, and reactive to light.  Cardiovascular:     Rate and Rhythm: Regular rhythm.  Abdominal:     Tenderness: There is no abdominal tenderness.  Musculoskeletal:     Cervical back: Neck supple.     Comments: Mild eccymosis right knee without underlying bony tenderness.  Skin:    Capillary Refill: Capillary refill takes less than 2 seconds.  Neurological:     Mental Status: She is alert.     Comments: Mild stuttering speech    ED Results / Procedures / Treatments   Labs (all labs ordered are listed, but only abnormal results are displayed) Labs Reviewed  URINALYSIS, ROUTINE W REFLEX MICROSCOPIC - Abnormal; Notable for the following components:      Result Value   Leukocytes,Ua SMALL (*)    All other components within normal limits  COMPREHENSIVE METABOLIC PANEL - Abnormal; Notable for the following components:   ALT 45 (*)    All other components within normal limits  CBC - Abnormal; Notable for the following components:   MCH 34.1 (*)    All other components within normal limits  URINALYSIS, MICROSCOPIC (REFLEX) - Abnormal; Notable for the following components:   Bacteria, UA FEW (*)    All other components within normal limits  PREGNANCY, URINE    EKG None  Radiology No results found.  Procedures Procedures    Medications Ordered in ED Medications  ondansetron (ZOFRAN) injection 4 mg (4 mg Intravenous Given 07/04/21 1941)  LORazepam (ATIVAN) injection 2 mg (2 mg Intravenous Given 07/04/21 2009)  sodium chloride 0.9 % bolus 500 mL (500 mLs Intravenous New Bag/Given 07/04/21 2305)    ED Course/ Medical Decision Making/ A&P                           Medical Decision Making Amount and/or Complexity of Data Reviewed Independent Historian: friend    Details: boyfriend External Data Reviewed: notes. Labs: ordered.    Details: no UTI or hematuria Radiology: ordered.  Risk Prescription drug management.  Critical Care Total time providing critical  care: 30-74 minutes  Patient with reported seizure.  States that she has been feeling bad.  Decreased oral intake.  States has been vomiting and may had not kept her medicine down.  History of seizure disorders and has a vagus nerve stimulator.  Appears to be on a relatively low-dose of Keppra at 500 twice daily.  But also on Xcopri and Topamax.  Had several episodes of seizure-like activity here.  She pressed the button to alert nursing for the first episode.  After that had more episodes.  Would awake.  Did have eye fluttering with the event.  Later when boyfriend was here may not placed on her vagus nerve stimulator.  However with continued issues discussed with neurology, who will see patient.  Neurology  has seen patient.  Unsure if the activity is seizure activity but reportedly has had previous positive EEG.  He recommends admission to the hospitalist.  We will get long-term EEG started.  We will treat off the results of that.  Patient also complained of abdominal pain.  Potential could be something viral.  Benign exam.  Has had kidney stones although no hematuria at this time.  No UTI.        Final Clinical Impression(s) / ED Diagnoses Final diagnoses:  Seizure-like activity [facility])    Rx / [place] Orders ED Discharge Orders     None         Davonna Belling, MD 07/04/21 2332

## 2021-07-04 NOTE — ED Notes (Signed)
Neurology at bedside.

## 2021-07-05 ENCOUNTER — Observation Stay (HOSPITAL_COMMUNITY): Payer: Self-pay

## 2021-07-05 ENCOUNTER — Encounter (HOSPITAL_COMMUNITY): Payer: Self-pay | Admitting: Internal Medicine

## 2021-07-05 DIAGNOSIS — R569 Unspecified convulsions: Secondary | ICD-10-CM

## 2021-07-05 DIAGNOSIS — G40919 Epilepsy, unspecified, intractable, without status epilepticus: Secondary | ICD-10-CM | POA: Diagnosis present

## 2021-07-05 DIAGNOSIS — G43909 Migraine, unspecified, not intractable, without status migrainosus: Secondary | ICD-10-CM | POA: Diagnosis present

## 2021-07-05 DIAGNOSIS — O09299 Supervision of pregnancy with other poor reproductive or obstetric history, unspecified trimester: Secondary | ICD-10-CM | POA: Diagnosis present

## 2021-07-05 DIAGNOSIS — G40909 Epilepsy, unspecified, not intractable, without status epilepticus: Principal | ICD-10-CM

## 2021-07-05 DIAGNOSIS — F329 Major depressive disorder, single episode, unspecified: Secondary | ICD-10-CM | POA: Diagnosis present

## 2021-07-05 DIAGNOSIS — I10 Essential (primary) hypertension: Secondary | ICD-10-CM

## 2021-07-05 HISTORY — DX: Migraine, unspecified, not intractable, without status migrainosus: G43.909

## 2021-07-05 HISTORY — DX: Epilepsy, unspecified, intractable, without status epilepticus: G40.919

## 2021-07-05 LAB — COMPREHENSIVE METABOLIC PANEL
ALT: 43 U/L (ref 0–44)
AST: 31 U/L (ref 15–41)
Albumin: 3.6 g/dL (ref 3.5–5.0)
Alkaline Phosphatase: 56 U/L (ref 38–126)
Anion gap: 8 (ref 5–15)
BUN: 8 mg/dL (ref 6–20)
CO2: 26 mmol/L (ref 22–32)
Calcium: 9.1 mg/dL (ref 8.9–10.3)
Chloride: 104 mmol/L (ref 98–111)
Creatinine, Ser: 0.77 mg/dL (ref 0.44–1.00)
GFR, Estimated: >60 mL/min (ref >60)
Glucose, Bld: 93 mg/dL (ref 70–99)
Potassium: 3.6 mmol/L (ref 3.5–5.1)
Sodium: 138 mmol/L (ref 135–145)
Total Bilirubin: 0.5 mg/dL (ref 0.3–1.2)
Total Protein: 5.8 g/dL — ABNORMAL LOW (ref 6.5–8.1)

## 2021-07-05 LAB — MAGNESIUM: Magnesium: 1.9 mg/dL (ref 1.7–2.4)

## 2021-07-05 LAB — CBC WITH DIFFERENTIAL/PLATELET
Abs Immature Granulocytes: 0.02 10*3/uL (ref 0.00–0.07)
Basophils Absolute: 0 10*3/uL (ref 0.0–0.1)
Basophils Relative: 1 %
Eosinophils Absolute: 0.1 10*3/uL (ref 0.0–0.5)
Eosinophils Relative: 2 %
HCT: 40.3 % (ref 36.0–46.0)
Hemoglobin: 13.8 g/dL (ref 12.0–15.0)
Immature Granulocytes: 0 %
Lymphocytes Relative: 42 %
Lymphs Abs: 2.3 10*3/uL (ref 0.7–4.0)
MCH: 34.5 pg — ABNORMAL HIGH (ref 26.0–34.0)
MCHC: 34.2 g/dL (ref 30.0–36.0)
MCV: 100.8 fL — ABNORMAL HIGH (ref 80.0–100.0)
Monocytes Absolute: 0.5 10*3/uL (ref 0.1–1.0)
Monocytes Relative: 8 %
Neutro Abs: 2.5 10*3/uL (ref 1.7–7.7)
Neutrophils Relative %: 47 %
Platelets: 220 10*3/uL (ref 150–400)
RBC: 4 MIL/uL (ref 3.87–5.11)
RDW: 11.8 % (ref 11.5–15.5)
WBC: 5.4 10*3/uL (ref 4.0–10.5)
nRBC: 0 % (ref 0.0–0.2)

## 2021-07-05 LAB — HIV ANTIBODY (ROUTINE TESTING W REFLEX): HIV Screen 4th Generation wRfx: NONREACTIVE

## 2021-07-05 MED ORDER — ENOXAPARIN SODIUM 40 MG/0.4ML IJ SOSY
40.0000 mg | PREFILLED_SYRINGE | Freq: Every day | INTRAMUSCULAR | Status: DC
  Administered 2021-07-05 – 2021-07-06 (×2): 40 mg via SUBCUTANEOUS
  Filled 2021-07-05 (×2): qty 0.4

## 2021-07-05 MED ORDER — HYDRALAZINE HCL 20 MG/ML IJ SOLN: 10.0000 mg | Freq: Four times a day (QID) | INTRAMUSCULAR | Status: DC | PRN

## 2021-07-05 MED ORDER — ONDANSETRON HCL 4 MG PO TABS: 4.0000 mg | ORAL_TABLET | Freq: Four times a day (QID) | ORAL | Status: DC | PRN

## 2021-07-05 MED ORDER — SUMATRIPTAN SUCCINATE 50 MG PO TABS
50.0000 mg | ORAL_TABLET | Freq: Every day | ORAL | Status: DC | PRN
  Filled 2021-07-05: qty 1

## 2021-07-05 MED ORDER — SODIUM CHLORIDE 0.9 % IV BOLUS
500.0000 mL | Freq: Once | INTRAVENOUS | Status: AC
  Administered 2021-07-05: 500 mL via INTRAVENOUS

## 2021-07-05 MED ORDER — ACETAMINOPHEN 650 MG RE SUPP: 650.0000 mg | Freq: Four times a day (QID) | RECTAL | Status: DC | PRN

## 2021-07-05 MED ORDER — ACETAMINOPHEN 325 MG PO TABS: 650.0000 mg | ORAL_TABLET | ORAL | Status: DC | PRN

## 2021-07-05 MED ORDER — DIAZEPAM 5 MG PO TABS
5.0000 mg | ORAL_TABLET | Freq: Two times a day (BID) | ORAL | Status: DC | PRN
  Administered 2021-07-05: 5 mg via ORAL
  Filled 2021-07-05: qty 1

## 2021-07-05 MED ORDER — TOPIRAMATE ER 100 MG PO SPRINKLE CAP24
100.0000 mg | EXTENDED_RELEASE_CAPSULE | Freq: Every day | ORAL | Status: DC
  Administered 2021-07-05 – 2021-07-06 (×2): 100 mg via ORAL
  Filled 2021-07-05 (×2): qty 1

## 2021-07-05 MED ORDER — POLYETHYLENE GLYCOL 3350 17 G PO PACK: 17.0000 g | PACK | Freq: Every day | ORAL | Status: DC | PRN

## 2021-07-05 MED ORDER — BUPROPION HCL ER (XL) 150 MG PO TB24
300.0000 mg | ORAL_TABLET | Freq: Every morning | ORAL | Status: DC
  Administered 2021-07-05 – 2021-07-06 (×2): 300 mg via ORAL
  Filled 2021-07-05: qty 1
  Filled 2021-07-05: qty 2

## 2021-07-05 MED ORDER — ESCITALOPRAM OXALATE 10 MG PO TABS
20.0000 mg | ORAL_TABLET | Freq: Every day | ORAL | Status: DC
  Administered 2021-07-05 – 2021-07-06 (×2): 20 mg via ORAL
  Filled 2021-07-05 (×2): qty 2

## 2021-07-05 MED ORDER — ACETAMINOPHEN 325 MG PO TABS: 650.0000 mg | ORAL_TABLET | Freq: Four times a day (QID) | ORAL | Status: DC | PRN

## 2021-07-05 MED ORDER — LORAZEPAM 2 MG/ML IJ SOLN
1.0000 mg | Freq: Four times a day (QID) | INTRAMUSCULAR | Status: DC | PRN
  Administered 2021-07-05: 1 mg via INTRAVENOUS
  Filled 2021-07-05: qty 1

## 2021-07-05 MED ORDER — BUTALBITAL-APAP-CAFFEINE 50-325-40 MG PO TABS
1.0000 | ORAL_TABLET | Freq: Four times a day (QID) | ORAL | Status: DC | PRN
  Administered 2021-07-05: 1 via ORAL
  Filled 2021-07-05: qty 1

## 2021-07-05 MED ORDER — CENOBAMATE 14 X 150 MG & 14 X200 MG PO TBPK: 200.0000 mg | ORAL_TABLET | Freq: Every day | ORAL | Status: DC

## 2021-07-05 MED ORDER — KETOROLAC TROMETHAMINE 30 MG/ML IJ SOLN
30.0000 mg | Freq: Once | INTRAMUSCULAR | Status: AC
  Administered 2021-07-05: 30 mg via INTRAVENOUS
  Filled 2021-07-05: qty 1

## 2021-07-05 MED ORDER — ACEBUTOLOL HCL 200 MG PO CAPS
200.0000 mg | ORAL_CAPSULE | Freq: Two times a day (BID) | ORAL | Status: DC
  Administered 2021-07-05 – 2021-07-06 (×4): 200 mg via ORAL
  Filled 2021-07-05 (×5): qty 1

## 2021-07-05 MED ORDER — VALACYCLOVIR HCL 500 MG PO TABS
500.0000 mg | ORAL_TABLET | Freq: Every day | ORAL | Status: DC
  Administered 2021-07-05 – 2021-07-06 (×2): 500 mg via ORAL
  Filled 2021-07-05 (×2): qty 1

## 2021-07-05 MED ORDER — ONDANSETRON HCL 4 MG/2ML IJ SOLN: 4.0000 mg | Freq: Four times a day (QID) | INTRAMUSCULAR | Status: DC | PRN

## 2021-07-05 MED ORDER — LEVETIRACETAM 500 MG PO TABS
500.0000 mg | ORAL_TABLET | Freq: Two times a day (BID) | ORAL | Status: DC
  Administered 2021-07-05 – 2021-07-06 (×4): 500 mg via ORAL
  Filled 2021-07-05 (×4): qty 1

## 2021-07-05 MED ORDER — KETOROLAC TROMETHAMINE 15 MG/ML IJ SOLN
15.0000 mg | Freq: Once | INTRAMUSCULAR | Status: AC
  Administered 2021-07-05: 15 mg via INTRAVENOUS
  Filled 2021-07-05: qty 1

## 2021-07-05 NOTE — Progress Notes (Signed)
Notified by RN that pt requesting Toradol. States she had toradol this am in [place] and it was only thing that helps her migraines.  Given toradol 30 mg IV one time

## 2021-07-05 NOTE — ED Notes (Signed)
Breakfast orders placed 

## 2021-07-05 NOTE — Progress Notes (Signed)
LTM EEG hooked up and running - no initial skin breakdown - push button tested - neuro notified.  

## 2021-07-05 NOTE — Assessment & Plan Note (Signed)
·   Patient exhibiting several bouts of seizure-like activity  Due to nature of repeated seizures despite significant anti-epileptic regimen pseudoseizures are also a possibility  ER providers already discussed case with Dr. Derry Lory with neurology who is evaluating patient in consultation, their input is appreciated and they are recommending continuous EEG monitoring overnight  Continuing home regimen of antiepileptic therapy  Monitoring patient closely on telemetry with seizure precautions  Monitoring electrolytes and correcting as necessary

## 2021-07-05 NOTE — ED Notes (Signed)
Pt alert and oriented x4.  No seizure activity noted when this writer entered the room.  Hospitalist at bedside.

## 2021-07-05 NOTE — ED Notes (Addendum)
This writer went into Pt's room to answer her call light and found Pt having a seizure.  Jerking noted in bilateral arms and legs.  Seizure last around 1.5-65mins.  This Clinical research associate administered 1mg  Ativan IV.  Seizure subsided shortly after Ativan was given.

## 2021-07-05 NOTE — Assessment & Plan Note (Signed)
·   Please see assessment and plan above °
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
## 2021-07-05 NOTE — Assessment & Plan Note (Signed)
.   Resume patients home regimen of oral antihypertensives . Titrate antihypertensive regimen as necessary to achieve adequate BP control . PRN intravenous antihypertensives for excessively elevated blood pressure   

## 2021-07-05 NOTE — Progress Notes (Signed)
Maint complete. 

## 2021-07-05 NOTE — H&P (Signed)
History and Physical    Pamela Booker ZOX:[id number] DOB: [birth year] DOA: 07/04/2021  PCP: Pcp, No  Patient coming from: Home   Chief Complaint:  Chief Complaint  Patient presents with   Seizures     HPI:    26 year old female with past medical history of bipolar type I disorder, epilepsy initially diagnosed in 2020 on Keppra Xcorpi and Topiramate status post VNS placement 02/2021 most recently [facility].  Patient additionally has a history of depression, migraine headaches, endometriosis.  Patient presenting [facility] department via EMS after experiencing a seizure at home.  Patient explains that she feels lately that she has been "developing a cold."  Patient is complaining of intermittent abdominal pain, nausea and bouts of vomiting.  Patient additionally reports low-grade fevers over the past few days with some intermittent flank pain on the right side.  She reports passing a kidney stone recently.    After several days of not feeling well patient reports that she had a seizure earlier in the day on 1/30 in her bathroom causing her to fall and strike her head.  This prompted her to call EMS and was promptly brought into [facility] department for evaluation.  Upon evaluation in the emergency department patient exhibited multiple additional episodes of seizure-like activity.  ER provider discussed case with Dr. Derry LoryKhaliqdina who recommended hospitalization for continuous EEG monitoring.  The hospitalist group has now been called to assess the patient for admission to the hospital.  Review of Systems:   Review of Systems  Gastrointestinal:  Positive for abdominal pain, nausea and vomiting.  Neurological:  Positive for seizures.  All other systems reviewed and are negative.  Past Medical History:  Diagnosis Date   Anxiety    Encounter for loop recorder check 06/10/2019   Epilepsy (HCC)     Hypertension    Loop: [employer] loop recorder in situ 05/06/2019   Scheduled Remote loop recorder check 06/09/2019: This represents an unremarkable monitoring session. The presenting rhythm is not available. No symptomatic or automatic episodes were triggered. Daily activity trends are stable. Heart rate variability trends are stable.   Migraines    Palpitations    PVC (premature ventricular contraction)    PVC's (premature ventricular contractions)    Seizures (HCC)    Syncope and collapse 06/10/2019    Past Surgical History:  Procedure Laterality Date   LAPAROSCOPY     TONSILLECTOMY     VAGUS NERVE STIMULATOR INSERTION       reports that she has never smoked. She has never used smokeless tobacco. She reports current alcohol use. She reports that she does not use drugs.  Allergies  Allergen Reactions   Nsaids Other (See Comments)    History of bleeding ulcers   Benadryl [Diphenhydramine] Other (See Comments)    "Crawling feeling under skin"   Ibuprofen     Other reaction(s): GI Upset (intolerance)   Emgality [Galcanezumab-Gnlm] Rash    Family History  Adopted: Yes  Problem Relation Age of Onset   Healthy Daughter      Prior to Admission medications   Medication Sig Start Date End Date Taking? Authorizing Provider  acebutolol (SECTRAL) 200 MG capsule TAKE ONE CAPSULE BY MOUTH TWICE A DAY DISCONTINUE VERAPAMIL Patient taking differently: Take 200 mg by mouth 2 (two) times daily. TAKE ONE CAPSULE BY MOUTH TWICE A DAY DISCONTINUE VERAPAMIL 06/02/21  Yes Cantwell, Celeste C, PA-C  buPROPion (WELLBUTRIN XL) 300  MG 24 hr tablet Take 300 mg by mouth every morning. 11/03/20  Yes [provider]  diazepam (VALIUM) 5 MG tablet Take 5 mg by mouth 2 (two) times daily as needed for muscle spasms. 05/31/21  Yes [provider]  escitalopram (LEXAPRO) 20 MG tablet Take 20 mg by mouth daily.  08/23/18  Yes [provider]  estradiol (VIVELLE-DOT) 0.075 MG/24HR  Place 1 patch onto the skin as needed (period bleeding). 03/02/21  Yes [provider]  levETIRAcetam (KEPPRA) 500 MG tablet Take 1 tablet (500 mg total) by mouth 2 (two) times daily. 05/06/20  Yes Gerhard Munch, MD  norethindrone (AYGESTIN) 5 MG tablet Take 5 mg by mouth daily. 02/26/19 07/05/21 Yes [provider]  PRESCRIPTION MEDICATION Apply 1 application topically every other day. Apply to face at night - Hers 20 ml Acne ( pump) Az/Zp (0.0125%)   Yes [provider]  tiZANidine (ZANAFLEX) 4 MG tablet Take 4-8 mg by mouth as needed for muscle spasms. 06/08/21  Yes [provider]  topiramate (TOPAMAX) 100 MG tablet Take 100 mg by mouth daily.   Yes [provider]  valACYclovir (VALTREX) 500 MG tablet Take 500 mg by mouth daily.   Yes [provider]  XCOPRI 14 x 150 MG & 14 x200 MG TBPK Take 200 mg by mouth at bedtime. 11/11/20  Yes [provider]  butalbital-acetaminophen-caffeine (FIORICET) 50-325-40 MG tablet Take 1-2 tablets by mouth every 6 (six) hours as needed for headache. 12/04/20   Gilda Crease, MD  rizatriptan (MAXALT-MLT) 10 MG disintegrating tablet Take 10 mg by mouth daily as needed for migraine.  03/17/19   [provider]  topiramate ER (QUDEXY XR) 50 MG CS24 sprinkle capsule Take 100 mg by mouth daily. Patient not taking: Reported on 07/05/2021 09/16/18   [provider]  omeprazole (PRILOSEC) 20 MG capsule Take 1 capsule (20 mg total) by mouth daily for 14 days. Patient not taking: Reported on 05/06/2020 11/03/19 10/02/20  Darr, Gerilyn Pilgrim, PA-C    Physical Exam: Vitals:   07/04/21 2300 07/05/21 0030 07/05/21 0130 07/05/21 0230  BP: (!) 106/52 105/63 (!) 98/54 100/62  Pulse: 84 85 75 86  Resp: 19 18 18 18   Temp:      TempSrc:      SpO2: 97% 98% 99% 100%   Constitutional: Awake alert and oriented x3, no associated distress.   Skin: no rashes, no lesions, good skin turgor noted. Eyes: Pupils  are equally reactive to light.  No evidence of scleral icterus or conjunctival pallor.  ENMT: Moist mucous membranes noted.  Posterior pharynx clear of any exudate or lesions.   Neck: normal, supple, no masses, no thyromegaly.  No evidence of jugular venous distension.   Respiratory: clear to auscultation bilaterally, no wheezing, no crackles. Normal respiratory effort. No accessory muscle use.  Cardiovascular: Regular rate and rhythm, no murmurs / rubs / gallops. No extremity edema. 2+ pedal pulses. No carotid bruits.  Chest:   Nontender without crepitus or deformity.   Back:   Nontender without crepitus or deformity. Abdomen: Abdomen is soft and nontender.  No evidence of intra-abdominal masses.  Positive bowel sounds noted in all quadrants.   Musculoskeletal: No joint deformity upper and lower extremities. Good ROM, no contractures. Normal muscle tone.  Neurologic: CN 2-12 grossly intact. Sensation intact.  Patient moving all 4 extremities spontaneously.  Patient is following all commands.  Patient is responsive to verbal stimuli.   Psychiatric: Patient exhibits anxious mood  with odd affect.  Patient seems to possess insight as to their current situation.     Labs on Admission: I have personally reviewed following labs and imaging studies -   CBC: Recent Labs  Lab 07/04/21 1925  WBC 5.4  HGB 14.8  HCT 43.4  MCV 100.0  PLT 238   Basic Metabolic Panel: Recent Labs  Lab 07/04/21 1925  NA 140  K 3.9  CL 105  CO2 28  GLUCOSE 83  BUN 8  CREATININE 0.67  CALCIUM 9.8   GFR: CrCl cannot be calculated (Unknown ideal weight.). Liver Function Tests: Recent Labs  Lab 07/04/21 1925  AST 27  ALT 45*  ALKPHOS 59  BILITOT 0.5  PROT 6.6  ALBUMIN 4.2   No results for input(s): LIPASE, AMYLASE in the last 168 hours. No results for input(s): AMMONIA in the last 168 hours. Coagulation Profile: No results for input(s): INR, PROTIME in the last 168 hours. Cardiac Enzymes: No  results for input(s): CKTOTAL, CKMB, CKMBINDEX, TROPONINI in the last 168 hours. BNP (last 3 results) No results for input(s): PROBNP in the last 8760 hours. HbA1C: No results for input(s): HGBA1C in the last 72 hours. CBG: No results for input(s): GLUCAP in the last 168 hours. Lipid Profile: No results for input(s): CHOL, HDL, LDLCALC, TRIG, CHOLHDL, LDLDIRECT in the last 72 hours. Thyroid Function Tests: No results for input(s): TSH, T4TOTAL, FREET4, T3FREE, THYROIDAB in the last 72 hours. Anemia Panel: No results for input(s): VITAMINB12, FOLATE, FERRITIN, TIBC, IRON, RETICCTPCT in the last 72 hours. Urine analysis:    Component Value Date/Time   COLORURINE YELLOW 07/04/2021 1957   APPEARANCEUR CLEAR 07/04/2021 1957   APPEARANCEUR Clear 02/12/2020 1701   LABSPEC 1.010 07/04/2021 1957   PHURINE 7.5 07/04/2021 1957   GLUCOSEU NEGATIVE 07/04/2021 1957   HGBUR NEGATIVE 07/04/2021 1957   BILIRUBINUR NEGATIVE 07/04/2021 1957   BILIRUBINUR negative 03/08/2021 0834   BILIRUBINUR Negative 02/12/2020 1701   KETONESUR NEGATIVE 07/04/2021 1957   PROTEINUR NEGATIVE 07/04/2021 1957   UROBILINOGEN 1.0 03/08/2021 0834   UROBILINOGEN 0.2 03/05/2013 2014   NITRITE NEGATIVE 07/04/2021 1957   LEUKOCYTESUR SMALL (A) 07/04/2021 1957    Radiological Exams on Admission - Personally Reviewed: CT HEAD WO CONTRAST (5MM)  Result Date: 07/04/2021 CLINICAL DATA:  Head trauma, focal neuro findings (Age 28-64y) EXAM: CT HEAD WITHOUT CONTRAST TECHNIQUE: Contiguous axial images were obtained from the base of the skull through the vertex without intravenous contrast. RADIATION DOSE REDUCTION: This exam was performed according to the departmental dose-optimization program which includes automated exposure control, adjustment of the mA and/or kV according to patient size and/or use of iterative reconstruction technique. COMPARISON:  None. FINDINGS: Brain: No evidence of large-territorial acute infarction. No  parenchymal hemorrhage. No mass lesion. No extra-axial collection. No mass effect or midline shift. No hydrocephalus. Basilar cisterns are patent. Vascular: No hyperdense vessel. Skull: No acute fracture or focal lesion. Sinuses/Orbits: Paranasal sinuses and mastoid air cells are clear. The orbits are unremarkable. Other: None. IMPRESSION: Negative for acute traumatic injury. Electronically Signed   By: Tish FredericksonMorgane  Naveau M.D.   On: 07/04/2021 23:55      Assessment/Plan  Assessment and Plan: * Breakthrough seizure (HCC)- (present on admission) Patient exhibiting several bouts of seizure-like activity Due to nature of repeated seizures despite significant anti-epileptic regimen pseudoseizures are also a possibility ER providers already discussed case with Dr. Derry LoryKhaliqdina with neurology who is evaluating patient in consultation, their input is appreciated and they are recommending continuous  EEG monitoring overnight Continuing home regimen of antiepileptic therapy Monitoring patient closely on telemetry with seizure precautions Monitoring electrolytes and correcting as necessary  Epilepsy (HCC) Please see assessment and plan above  Major depressive disorder- (present on admission) Continue home regimen of psychotropic medications  Migraine headache- (present on admission) As needed Fioricet for now  Essential hypertension- (present on admission) Resume patients home regimen of oral antihypertensives Titrate antihypertensive regimen as necessary to achieve adequate BP control PRN intravenous antihypertensives for excessively elevated blood pressure           Code Status:  Full code  code status decision has been confirmed with: patient Family Communication: deferred   Status is: Observation The patient remains OBS appropriate and will d/c before 2 midnights.  Planned Discharge Destination: Home         Marinda Elk MD [facility] Pager [phone]  If  7PM-7AM, please contact night-coverage [email or web address] Use universal [place] password for that web site. If you do not have the password, please call the hospital operator.  07/05/2021, 4:32 AM

## 2021-07-05 NOTE — Procedures (Addendum)
LeftPatient Name: Pamela Booker  MRN: [id number]  Epilepsy Attending: Charlsie Quest  Referring Physician/Provider: Erick Blinks, MD Duration: 07/05/2021 0310 to 07/06/2021 0310  Patient history: 26 y.o. female with PMH significant for anxiety, hypertension, epilepsy on Keppra, Xcorpi, Topiramate and status post VNS placement, hx of migraines, PVCs who presents with 5 episodes concerning for seizures today. EEG to evaluate for seizure  Level of alertness: Awake, asleep  AEDs during EEG study: Xcopri, LEV, TPM, Ativan  Technical aspects: This EEG study was done with scalp electrodes positioned according to the 10-20 International system of electrode placement. Electrical activity was acquired at a sampling rate of 500Hz  and reviewed with a high frequency filter of 70Hz  and a low frequency filter of 1Hz . EEG data were recorded continuously and digitally stored.   Description: The posterior dominant rhythm consists of 8-9Hz  activity of moderate voltage (25-35 uV) seen predominantly in posterior head regions, symmetric and reactive to eye opening and eye closing. Sleep was characterized by vertex waves, sleep spindles (12 to 14 Hz), maximal frontocentral region.  There is an excessive amount of 15 to 18 Hz beta activity distributed symmetrically and diffusely. Hyperventilation and photic stimulation were not performed.     One event was recorded on 07/05/2021 at 1453.  Patient pressed the nurse call button. She was laying in bed in left lateral position and had generalized whole-body trembling movements.  RN came in and administered IV Ativan after which the jerking stopped at around 1459. Concomitant EEG before, during and after the event showed movement artifact.    One event was recorded on 07/05/2021 at 2218.  Patient was laying in bed and had upper extremity twitching.  Twitching stopped at around 2221. Concomitant EEG before, during and after the event showed movement artifact.       ABNORMALITY - Excessive beta, generalized  IMPRESSION: This study is within normal limits. The excessive beta activity seen in the background is most likely due to the effect of benzodiazepine and is a benign EEG pattern. No seizures or epileptiform discharges were seen throughout the recording.  Two events were recorded on 07/05/2021 at 1453 and 2218 as described above without concomitant EEG change.  These were NON-epileptic events.   2222

## 2021-07-05 NOTE — Assessment & Plan Note (Signed)
·   As needed Fioricet for now

## 2021-07-05 NOTE — Assessment & Plan Note (Signed)
Continue home regimen of psychotropic medications 

## 2021-07-06 DIAGNOSIS — G40919 Epilepsy, unspecified, intractable, without status epilepticus: Secondary | ICD-10-CM | POA: Diagnosis not present

## 2021-07-06 LAB — VITAMIN B12: Vitamin B-12: 221 pg/mL (ref 180–914)

## 2021-07-06 MED ORDER — KETOROLAC TROMETHAMINE 15 MG/ML IJ SOLN
15.0000 mg | Freq: Once | INTRAMUSCULAR | Status: AC
  Administered 2021-07-06: 15 mg via INTRAVENOUS
  Filled 2021-07-06: qty 1

## 2021-07-06 NOTE — Procedures (Addendum)
Patient Name: Pamela Booker  MRN: [id number]  Epilepsy Attending: Lora Havens  Referring Physician/Provider: Donnetta Simpers, MD Duration: 07/06/2021 0310 to 07/06/2021 1056   Patient history: 26 y.o. female with PMH significant for anxiety, hypertension, epilepsy on Keppra, Xcorpi, Topiramate and status post VNS placement, hx of migraines, PVCs who presents with 5 episodes concerning for seizures today. EEG to evaluate for seizure   Level of alertness: Awake, asleep   AEDs during EEG study: Xcopri, LEV, TPM   Technical aspects: This EEG study was done with scalp electrodes positioned according to the 10-20 International system of electrode placement. Electrical activity was acquired at a sampling rate of 500Hz  and reviewed with a high frequency filter of 70Hz  and a low frequency filter of 1Hz . EEG data were recorded continuously and digitally stored.    Description: The posterior dominant rhythm consists of 8-9Hz  activity of moderate voltage (25-35 uV) seen predominantly in posterior head regions, symmetric and reactive to eye opening and eye closing. Sleep was characterized by vertex waves, sleep spindles (12 to 14 Hz), maximal frontocentral region.  There is an excessive amount of 15 to 18 Hz beta activity distributed symmetrically and diffusely. Hyperventilation and photic stimulation were not performed.      ABNORMALITY - Excessive beta, generalized   IMPRESSION: This study is within normal limits. The excessive beta activity seen in the background is most likely due to the effect of benzodiazepine and is a benign EEG pattern. No seizures or epileptiform discharges were seen throughout the recording.     [name]

## 2021-07-06 NOTE — Progress Notes (Signed)
Subjective: Patient events overnight which were recorded on video EEG.  Reporting mild headache this morning.  ROS: negative except above  Examination  Vital signs in last 24 hours: Temp:  [97.6 F (36.4 C)-98.8 F (37.1 C)] 98.2 F (36.8 C) (02/01 1202) Pulse Rate:  [65-107] 65 (02/01 1202) Resp:  [15-23] 16 (02/01 1202) BP: (97-115)/(57-80) 114/73 (02/01 1202) SpO2:  [97 %-100 %] 100 % (02/01 1202)  General: lying in bed, NAD CVS: pulse-normal rate and rhythm RS: breathing comfortably, CTAB Extremities: warm, no edema   Neuro: MS: Alert, oriented, follows commands CN: pupils equal and reactive,  EOMI, face symmetric, tongue midline, normal sensation over face, Motor: 5/5 strength in all 4 extremities Reflexes: 2+ bilaterally over patella, biceps, plantars: flexor Coordination: normal Gait: not tested    Basic Metabolic Panel: Recent Labs  Lab 07/04/21 1925 07/05/21 0452  NA 140 138  K 3.9 3.6  CL 105 104  CO2 28 26  GLUCOSE 83 93  BUN 8 8  CREATININE 0.67 0.77  CALCIUM 9.8 9.1  MG  --  1.9    CBC: Recent Labs  Lab 07/04/21 1925 07/05/21 0452  WBC 5.4 5.4  NEUTROABS  --  2.5  HGB 14.8 13.8  HCT 43.4 40.3  MCV 100.0 100.8*  PLT 238 220     Coagulation Studies: No results for input(s): LABPROT, INR in the last 72 hours.  Imaging No new brain imaging overnight  ASSESSMENT AND PLAN:  26 yo female with history of medically refractory epilepsy status post VNS placement who presented with multiple seizure-like episodes.   Epilepsy Nonepileptic events Headache Hypoproteinemia -LTM EEG showed 2 events overnight which were nonepileptic -Patients with epilepsy can also have coexisting nonepileptic events.  I suspect the current increased frequency of seizure-like episodes are nonepileptic events due to recent stressors   Recommendations -Continue home AEDs -Discussed the diagnosis of nonepileptic events with patient.  Patient agrees with the  diagnosis.  Also discussed about cognitive able therapy -IV Toradol once for headache -Patient requested to check B12 level.  Patient does have elevated MCV and hypoproteinemia.  B12 level ordered -Continue seizure precautions -Follow-up with primary neurologist in 2 to 3 months   Seizure precautions: Per [facility] statutes, patients with seizures are not allowed to drive until they have been seizure-free for six months and cleared by a physician    Use caution when using heavy equipment or power tools. Avoid working on ladders or at heights. Take showers instead of baths. Ensure the water temperature is not too high on the home water heater. Do not go swimming alone. Do not lock yourself in a room alone (i.e. bathroom). When caring for infants or small children, sit down when holding, feeding, or changing them to minimize risk of injury to the child in the event you have a seizure. Maintain good sleep hygiene. Avoid alcohol.    If patient has another seizure, call 911 and bring them back to the ED if: A.  The seizure lasts longer than 5 minutes.      B.  The patient doesn't wake shortly after the seizure or has new problems such as difficulty seeing, speaking or moving following the seizure C.  The patient was injured during the seizure D.  The patient has a temperature over 102 F (39C) E.  The patient vomited during the seizure and now is having trouble breathing    During the Seizure   - First, ensure adequate ventilation and place  patients on the floor on their left side  Loosen clothing around the neck and ensure the airway is patent. If the patient is clenching the teeth, do not force the mouth open with any object as this can cause severe damage - Remove all items from the surrounding that can be hazardous. The patient may be oblivious to what's happening and may not even know what he or she is doing. If the patient is confused and wandering, either gently guide him/her away  and block access to outside areas - Reassure the individual and be comforting - Call 911. In most cases, the seizure ends before EMS arrives. However, there are cases when seizures may last over 3 to 5 minutes. Or the individual may have developed breathing difficulties or severe injuries. If a pregnant patient or a person with diabetes develops a seizure, it is prudent to call an ambulance. - Finally, if the patient does not regain full consciousness, then call EMS. Most patients will remain confused for about 45 to 90 minutes after a seizure, so you must use judgment in calling for help. - Avoid restraints but make sure the patient is in a bed with padded side rails - Place the individual in a lateral position with the neck slightly flexed; this will help the saliva drain from the mouth and prevent the tongue from falling backward - Remove all nearby furniture and other hazards from the area - Provide verbal assurance as the individual is regaining consciousness - Provide the patient with privacy if possible - Call for help and start treatment as ordered by the caregiver    After the Seizure (Postictal Stage)   After a seizure, most patients experience confusion, fatigue, muscle pain and/or a headache. Thus, one should permit the individual to sleep. For the next few days, reassurance is essential. Being calm and helping reorient the person is also of importance.   Most seizures are painless and end spontaneously. Seizures are not harmful to others but can lead to complications such as stress on the lungs, brain and the heart. Individuals with prior lung problems may develop labored breathing and respiratory distress.        Lindie Spruce Epilepsy [facility] For questions after 5pm please refer to AMION to reach the Neurologist on call

## 2021-07-06 NOTE — Discharge Summary (Signed)
Physician Discharge Summary  Tycie Nance [facility] [id number] DOB: [birth year] DOA: 07/04/2021  PCP: Pcp, No  Admit date: 07/04/2021 Discharge date: 07/06/2021  Discharge Diagnoses:  Principal Problem:   Breakthrough seizure [facility]) Active Problems:   Major depressive disorder   Epilepsy ([place])   Migraine headache   Essential hypertension   Seizure [facility])   Discharge Condition: Stable   History of present illness:  26 year old female with past medical history of bipolar type I disorder, epilepsy initially diagnosed in 2020 on Keppra Xcorpi and Topiramate status post VNS placement 02/2021 most recently following with [facility] neurology in [facility].  Patient additionally has a history of depression, migraine headaches, endometriosis.  Patient presenting to [facility] emergency department via EMS after experiencing a seizure at home.   Patient explains that she feels lately that she has been "developing a cold."  Patient is complaining of intermittent abdominal pain, nausea and bouts of vomiting.  Patient additionally reports low-grade fevers over the past few days with some intermittent flank pain on the right side.  She reports passing a kidney stone recently.     After several days of not feeling well patient reports that she had a seizure earlier in the day on 1/30 in her bathroom causing her to fall and strike her head.  This prompted her to call EMS and was promptly brought into [facility] emergency department for evaluation.   Upon evaluation in the emergency department patient exhibited multiple additional episodes of seizure-like activity.  ER provider discussed case with Dr. Lorrin Goodell who recommended hospitalization for continuous EEG monitoring.  The hospitalist group has now been called to assess the patient for admission to the hospital.  Patient was admitted had a 24-hour EEG monitoring which did not show any active epileptic activity.  Neurology was  involved.  There was no recommendations of any medication changes.  Psychiatric services were offered to the patient she wanted to continue with her outpatient service that she already has established.  Patient being discharged to follow with primary care physician in 1 to 2 weeks and with her neurologist as routine.  Again EEG monitoring normal.    Discharge Exam: Vitals:   07/06/21 1202 07/06/21 1459  BP: 114/73 115/69  Pulse: 65 68  Resp: 16   Temp: 98.2 F (36.8 C)   SpO2: 100% 100%    General: Alert and orient x4 no apparent distress Cardiovascular: Regular rate rhythm without murmurs rubs or gallops Respiratory: Clear to auscultation bilaterally no wheezes rhonchi rales  Discharge Instructions   Discharge Instructions     Diet - low sodium heart healthy   Complete by: As directed    Discharge instructions   Complete by: As directed    Follow-up with your neurologist routine  Follow-up with primary care physician in 1 to 2 weeks   Increase activity slowly   Complete by: As directed       Allergies as of 07/06/2021       Reactions   Nsaids Other (See Comments)   History of bleeding ulcers   Benadryl [diphenhydramine] Other (See Comments)   "Crawling feeling under skin"   Ibuprofen    Other reaction(s): GI Upset (intolerance)   Emgality [galcanezumab-gnlm] Rash        Medication List     TAKE these medications    acebutolol 200 MG capsule Commonly known as: SECTRAL TAKE ONE CAPSULE BY MOUTH TWICE A DAY DISCONTINUE VERAPAMIL What changed:  how much to take how  to take this when to take this   buPROPion 300 MG 24 hr tablet Commonly known as: WELLBUTRIN XL Take 300 mg by mouth every morning.   butalbital-acetaminophen-caffeine 50-325-40 MG tablet Commonly known as: FIORICET Take 1-2 tablets by mouth every 6 (six) hours as needed for headache.   diazepam 5 MG tablet Commonly known as: VALIUM Take 5 mg by mouth 2 (two) times daily as needed for  muscle spasms.   escitalopram 20 MG tablet Commonly known as: LEXAPRO Take 20 mg by mouth daily.   estradiol 0.075 MG/24HR Commonly known as: VIVELLE-DOT Place 1 patch onto the skin as needed (period bleeding).   levETIRAcetam 500 MG tablet Commonly known as: Keppra Take 1 tablet (500 mg total) by mouth 2 (two) times daily.   norethindrone 5 MG tablet Commonly known as: AYGESTIN Take 5 mg by mouth daily.   PRESCRIPTION MEDICATION Apply 1 application topically every other day. Apply to face at night - Hers 20 ml Acne ( pump) Az/Zp (0.0125%)   rizatriptan 10 MG disintegrating tablet Commonly known as: MAXALT-MLT Take 10 mg by mouth daily as needed for migraine.   tiZANidine 4 MG tablet Commonly known as: ZANAFLEX Take 4-8 mg by mouth as needed for muscle spasms.   topiramate 100 MG tablet Commonly known as: TOPAMAX Take 100 mg by mouth daily. What changed: Another medication with the same name was removed. Continue taking this medication, and follow the directions you see here.   valACYclovir 500 MG tablet Commonly known as: VALTREX Take 500 mg by mouth daily.   Xcopri 14 x 150 MG & 14 x200 MG Tbpk Generic drug: Cenobamate Take 200 mg by mouth at bedtime.       Allergies  Allergen Reactions   Nsaids Other (See Comments)    History of bleeding ulcers   Benadryl [Diphenhydramine] Other (See Comments)    "Crawling feeling under skin"   Ibuprofen     Other reaction(s): GI Upset (intolerance)   Emgality [Galcanezumab-Gnlm] Rash    Follow-up Information     [facility] Follow up.   Specialty: Primary Care Why: Call to see about getting an appointment Ask clinic about transportation services for appointments Contact information: [address] [place], [facility] Follow up.   Why: Or try this clinic for primary care if [facility] doesnt work out [facility]  information: [place] [address] [phone]         [facility]. Follow up.   Contact information: [phone]                 The results of significant diagnostics from this hospitalization (including imaging, microbiology, ancillary and laboratory) are listed below for reference.    Significant Diagnostic Studies: CT HEAD WO CONTRAST (5MM)  Result Date: 07/04/2021 CLINICAL DATA:  Head trauma, focal neuro findings (Age 67-64y) EXAM: CT HEAD WITHOUT CONTRAST TECHNIQUE: Contiguous axial images were obtained from the base of the skull through the vertex without intravenous contrast. RADIATION DOSE REDUCTION: This exam was performed according to the departmental dose-optimization program which includes automated exposure control, adjustment of the mA and/or kV according to patient size and/or use of iterative reconstruction technique. COMPARISON:  None. FINDINGS: Brain: No evidence of large-territorial acute infarction. No parenchymal hemorrhage. No mass lesion. No extra-axial collection. No mass effect or midline shift. No hydrocephalus. Basilar cisterns are patent. Vascular: No hyperdense vessel.  Skull: No acute fracture or focal lesion. Sinuses/Orbits: Paranasal sinuses and mastoid air cells are clear. The orbits are unremarkable. Other: None. IMPRESSION: Negative for acute traumatic injury. Electronically Signed   By: Iven Finn M.D.   On: 07/04/2021 23:55   Overnight EEG with video  Result Date: 07/05/2021 Lora Havens, MD     07/06/2021  9:42 AM LeftPatient Name: Moni Whiley MRN: [id number] Epilepsy Attending: Lora Havens Referring Physician/Provider: Donnetta Simpers, MD Duration: 07/05/2021 0310 to 07/06/2021 0310 Patient history: 26 y.o. female with PMH significant for anxiety, hypertension, epilepsy on Keppra, Xcorpi, Topiramate and status post VNS placement, hx of migraines, PVCs who presents with 5 episodes concerning for  seizures today. EEG to evaluate for seizure Level of alertness: Awake, asleep AEDs during EEG study: Xcopri, LEV, TPM, Ativan Technical aspects: This EEG study was done with scalp electrodes positioned according to the 10-20 International system of electrode placement. Electrical activity was acquired at a sampling rate of 500Hz  and reviewed with a high frequency filter of 70Hz  and a low frequency filter of 1Hz . EEG data were recorded continuously and digitally stored. Description: The posterior dominant rhythm consists of 8-9Hz  activity of moderate voltage (25-35 uV) seen predominantly in posterior head regions, symmetric and reactive to eye opening and eye closing. Sleep was characterized by vertex waves, sleep spindles (12 to 14 Hz), maximal frontocentral region.  There is an excessive amount of 15 to 18 Hz beta activity distributed symmetrically and diffusely. Hyperventilation and photic stimulation were not performed.   One event was recorded on 07/05/2021 at 1453.  Patient pressed the nurse call button. She was laying in bed in left lateral position and had generalized whole-body trembling movements.  RN came in and administered IV Ativan after which the jerking stopped at around 1459. Concomitant EEG before, during and after the event showed movement artifact.  One event was recorded on 07/05/2021 at 2218.  Patient was laying in bed and had upper extremity twitching.  Twitching stopped at around 2221. Concomitant EEG before, during and after the event showed movement artifact.   ABNORMALITY - Excessive beta, generalized IMPRESSION: This study is within normal limits. The excessive beta activity seen in the background is most likely due to the effect of benzodiazepine and is a benign EEG pattern. No seizures or epileptiform discharges were seen throughout the recording. Two events were recorded on 07/05/2021 at 1453 and 2218 as described above without concomitant EEG change.  These were NON-epileptic events.  Lora Havens    Microbiology: No results found for this or any previous visit (from the past 240 hour(s)).   Labs: Basic Metabolic Panel: Recent Labs  Lab 07/04/21 1925 07/05/21 0452  NA 140 138  K 3.9 3.6  CL 105 104  CO2 28 26  GLUCOSE 83 93  BUN 8 8  CREATININE 0.67 0.77  CALCIUM 9.8 9.1  MG  --  1.9   Liver Function Tests: Recent Labs  Lab 07/04/21 1925 07/05/21 0452  AST 27 31  ALT 45* 43  ALKPHOS 59 56  BILITOT 0.5 0.5  PROT 6.6 5.8*  ALBUMIN 4.2 3.6   No results for input(s): LIPASE, AMYLASE in the last 168 hours. No results for input(s): AMMONIA in the last 168 hours. CBC: Recent Labs  Lab 07/04/21 1925 07/05/21 0452  WBC 5.4 5.4  NEUTROABS  --  2.5  HGB 14.8 13.8  HCT 43.4 40.3  MCV 100.0 100.8*  PLT 238 220   Cardiac Enzymes:  No results for input(s): CKTOTAL, CKMB, CKMBINDEX, TROPONINI in the last 168 hours. BNP: BNP (last 3 results) No results for input(s): BNP in the last 8760 hours.  ProBNP (last 3 results) No results for input(s): PROBNP in the last 8760 hours.  CBG: No results for input(s): GLUCAP in the last 168 hours.     Signed:  Phillips Grout MD.  [facility] 07/06/2021, 3:46 PM

## 2021-07-06 NOTE — TOC Transition Note (Signed)
Transition of Care [facility]) - CM/SW Discharge Note   Patient Details  Name: Pamela Booker MRN: [id number] Date of Birth: 1995/09/23  Transition of Care [facility]) CM/SW Contact:  Kermit Balo, RN Phone Number: 07/06/2021, 12:06 PM   Clinical Narrative:    Patient is discharging home with self care. She has her boyfriend at home in evenings, her 26 yo and her sister will be staying until Friday.  She uses her sister in law to provide needed transportation. CM has added [facility] if needed.  No PCP. Cm has provided clinics in [place] (pt preference) that will provide medical care at reduced cost and assist with medications.  Pt has transport home today.    Final next level of care: Home/Self Care Barriers to Discharge: Inadequate or no insurance, Barriers Unresolved (comment)   Patient Goals and CMS Choice        Discharge Placement                       Discharge Plan and Services                                     Social Determinants of Health (SDOH) Interventions     Readmission Risk Interventions No flowsheet data found.

## 2021-07-06 NOTE — Progress Notes (Signed)
Pamela Booker to be D/C'd home per MD order. Discussed with the patient and all questions fully answered.  Skin clean, dry and intact without evidence of skin break down, no evidence of skin tears noted.  IV catheter discontinued intact. Site without signs and symptoms of complications. Dressing and pressure applied.  An After Visit Summary was printed and given to the patient.  Patient escorted via [place], and D/C home via private auto.  Pamela Booker  07/06/2021 5:03 PM

## 2021-07-06 NOTE — Progress Notes (Signed)
LTM EEG discontinued - no skin breakdown at unhook.   

## 2021-07-30 ENCOUNTER — Emergency Department (HOSPITAL_BASED_OUTPATIENT_CLINIC_OR_DEPARTMENT_OTHER): Payer: BC Managed Care – PPO

## 2021-07-30 ENCOUNTER — Other Ambulatory Visit: Payer: Self-pay

## 2021-07-30 ENCOUNTER — Emergency Department (HOSPITAL_BASED_OUTPATIENT_CLINIC_OR_DEPARTMENT_OTHER)
Admission: EM | Admit: 2021-07-30 | Discharge: 2021-07-31 | Disposition: A | Discharge status: Home / Self Care | Payer: BC Managed Care – PPO | Attending: Emergency Medicine | Admitting: Emergency Medicine

## 2021-07-30 DIAGNOSIS — R569 Unspecified convulsions: Secondary | ICD-10-CM

## 2021-07-30 DIAGNOSIS — G40909 Epilepsy, unspecified, not intractable, without status epilepticus: Secondary | ICD-10-CM | POA: Diagnosis not present

## 2021-07-30 DIAGNOSIS — R110 Nausea: Secondary | ICD-10-CM | POA: Insufficient documentation

## 2021-07-30 DIAGNOSIS — R1031 Right lower quadrant pain: Secondary | ICD-10-CM | POA: Diagnosis not present

## 2021-07-30 LAB — URINALYSIS, ROUTINE W REFLEX MICROSCOPIC
Bilirubin Urine: NEGATIVE
Glucose, UA: NEGATIVE mg/dL
Hgb urine dipstick: NEGATIVE
Leukocytes,Ua: NEGATIVE
Nitrite: NEGATIVE
Protein, ur: NEGATIVE mg/dL
Specific Gravity, Urine: 1.016 (ref 1.005–1.030)
pH: 7 (ref 5.0–8.0)

## 2021-07-30 LAB — COMPREHENSIVE METABOLIC PANEL
ALT: 55 U/L — ABNORMAL HIGH (ref 0–44)
AST: 26 U/L (ref 15–41)
Albumin: 4.7 g/dL (ref 3.5–5.0)
Alkaline Phosphatase: 70 U/L (ref 38–126)
Anion gap: 11 (ref 5–15)
BUN: 8 mg/dL (ref 6–20)
CO2: 21 mmol/L — ABNORMAL LOW (ref 22–32)
Calcium: 10 mg/dL (ref 8.9–10.3)
Chloride: 106 mmol/L (ref 98–111)
Creatinine, Ser: 0.74 mg/dL (ref 0.44–1.00)
GFR, Estimated: >60 mL/min (ref >60)
Glucose, Bld: 83 mg/dL (ref 70–99)
Potassium: 3.8 mmol/L (ref 3.5–5.1)
Sodium: 138 mmol/L (ref 135–145)
Total Bilirubin: 0.5 mg/dL (ref 0.3–1.2)
Total Protein: 6.6 g/dL (ref 6.5–8.1)

## 2021-07-30 LAB — CBC
HCT: 43.6 % (ref 36.0–46.0)
Hemoglobin: 14.7 g/dL (ref 12.0–15.0)
MCH: 32.8 pg (ref 26.0–34.0)
MCHC: 33.7 g/dL (ref 30.0–36.0)
MCV: 97.3 fL (ref 80.0–100.0)
Platelets: 258 10*3/uL (ref 150–400)
RBC: 4.48 MIL/uL (ref 3.87–5.11)
RDW: 12.1 % (ref 11.5–15.5)
WBC: 6.5 10*3/uL (ref 4.0–10.5)
nRBC: 0 % (ref 0.0–0.2)

## 2021-07-30 LAB — CBG MONITORING, ED: Glucose-Capillary: 84 mg/dL (ref 70–99)

## 2021-07-30 LAB — LIPASE, BLOOD: Lipase: 10 U/L — ABNORMAL LOW (ref 11–51)

## 2021-07-30 LAB — PREGNANCY, URINE: Preg Test, Ur: NEGATIVE

## 2021-07-30 MED ORDER — LEVETIRACETAM IN NACL 1000 MG/100ML IV SOLN
1000.0000 mg | Freq: Once | INTRAVENOUS | Status: AC
  Administered 2021-07-30: 1000 mg via INTRAVENOUS
  Filled 2021-07-30: qty 100

## 2021-07-30 MED ORDER — LORAZEPAM 2 MG/ML IJ SOLN
INTRAMUSCULAR | Status: AC
  Administered 2021-07-30: 1 mg via INTRAVENOUS
  Filled 2021-07-30: qty 1

## 2021-07-30 MED ORDER — ONDANSETRON HCL 4 MG/2ML IJ SOLN
4.0000 mg | Freq: Once | INTRAMUSCULAR | Status: AC
  Administered 2021-07-30: 4 mg via INTRAVENOUS
  Filled 2021-07-30: qty 2

## 2021-07-30 MED ORDER — MORPHINE SULFATE (PF) 4 MG/ML IV SOLN
4.0000 mg | Freq: Once | INTRAVENOUS | Status: AC
  Administered 2021-07-30: 4 mg via INTRAVENOUS
  Filled 2021-07-30: qty 1

## 2021-07-30 MED ORDER — LORAZEPAM 2 MG/ML IJ SOLN: 1.0000 mg | Freq: Once | INTRAMUSCULAR | Status: AC

## 2021-07-30 NOTE — ED Triage Notes (Signed)
Pt presents to the ED POV from home. Reports RLQ abdominal pain that started today. Reports some nausea, denies emesis or diarrhea. Pt does report still having her appendix. RLQ tender to palpitation. Pt does have a hx of seizures and has a vagus nerve stimulator in place. Reports having a seizure about an hour ago. Does have the magnet in a bracelet on her wrist that activates the vagus nerve stimulator

## 2021-07-30 NOTE — ED Notes (Signed)
Patient transported to CT 

## 2021-07-30 NOTE — ED Notes (Signed)
Pt brought back from triage to room d/t concerns of ongoing seizure activity. Pt noted to have rapid eye movement, unresponsive to stimuli, and tremor to left arm. Given 1 IV ativan. Seizure pads/suction at bedside. Place 4 L supplemental oxygen via nasal cannula. Keppra infusing. Pt now responding to external stimuli, nodding and gesturing appropriately.

## 2021-07-30 NOTE — ED Provider Notes (Signed)
[place] EMERGENCY DEPT Provider Note   CSN: [id number] Arrival date & time: 07/30/21  1948     History  Chief Complaint  Patient presents with   Abdominal Pain    Pamela Booker is a 26 y.o. female.  Patient is a 26 year old who presents with abdominal pain.  Per triage note, she had reported some right side abdominal pain that started today.  She had some nausea but no vomiting or diarrhea.  Her boyfriend who also gives history said it was in her right side/flank.  When she got to the ED, she started having seizure activity.  When I assessed the patient, she had twitching of her eyes and some twitching of her left arm.  She was not responsive.  Per boyfriend, she has a history of epileptic seizures and stress-induced seizures.  On chart review, patient has a history of bipolar disorder and epilepsy.  She is on Keppra and Topamax.  She also had a vagal nerve stimulator placed in 2022.  She had a recent admission about a month ago for seizure activity.  She had had repeated seizure activity in the ED with eye twitching.  She was admitted and had continuous EEG monitoring that did not show any epileptic activity.  It was felt that she may have stress-induced seizures.  She was seen by neurology.  Her medications were not changed.      Home Medications Prior to Admission medications   Medication Sig Start Date End Date Taking? Authorizing Provider  acebutolol (SECTRAL) 200 MG capsule TAKE ONE CAPSULE BY MOUTH TWICE A DAY DISCONTINUE VERAPAMIL Patient taking differently: Take 200 mg by mouth 2 (two) times daily. TAKE ONE CAPSULE BY MOUTH TWICE A DAY DISCONTINUE VERAPAMIL 06/02/21   Cantwell, Celeste C, PA-C  buPROPion (WELLBUTRIN XL) 300 MG 24 hr tablet Take 300 mg by mouth every morning. 11/03/20   [provider]  butalbital-acetaminophen-caffeine (FIORICET) 50-325-40 MG tablet Take 1-2 tablets by mouth every 6 (six) hours as needed for headache. 12/04/20    Orpah Greek, MD  diazepam (VALIUM) 5 MG tablet Take 5 mg by mouth 2 (two) times daily as needed for muscle spasms. 05/31/21   [provider]  escitalopram (LEXAPRO) 20 MG tablet Take 20 mg by mouth daily.  08/23/18   [provider]  estradiol (VIVELLE-DOT) 0.075 MG/24HR Place 1 patch onto the skin as needed (period bleeding). 03/02/21   [provider]  levETIRAcetam (KEPPRA) 500 MG tablet Take 1 tablet (500 mg total) by mouth 2 (two) times daily. 05/06/20   Carmin Muskrat, MD  norethindrone (AYGESTIN) 5 MG tablet Take 5 mg by mouth daily. 02/26/19 07/05/21  [provider]  PRESCRIPTION MEDICATION Apply 1 application topically every other day. Apply to face at night - Hers 20 ml Acne ( pump) Az/Zp (0.0125%)    [provider]  rizatriptan (MAXALT-MLT) 10 MG disintegrating tablet Take 10 mg by mouth daily as needed for migraine.  03/17/19   [provider]  tiZANidine (ZANAFLEX) 4 MG tablet Take 4-8 mg by mouth as needed for muscle spasms. 06/08/21   [provider]  topiramate (TOPAMAX) 100 MG tablet Take 100 mg by mouth daily.    [provider]  valACYclovir (VALTREX) 500 MG tablet Take 500 mg by mouth daily.    [provider]  XCOPRI 14 x 150 MG & 14 x200 MG TBPK Take 200 mg by mouth at bedtime. 11/11/20   [provider]  omeprazole ([place])  20 MG capsule Take 1 capsule (20 mg total) by mouth daily for 14 days. Patient not taking: Reported on 05/06/2020 11/03/19 10/02/20  Darr, Edison Nasuti, PA-C      Allergies    Nsaids, Benadryl [diphenhydramine], Ibuprofen, and Emgality [galcanezumab-gnlm]    Review of Systems   Review of Systems  Unable to perform ROS: Mental status change   Physical Exam Updated Vital Signs BP 101/60    Pulse 81    Temp 99.3 F (37.4 C) (Oral)    Resp 15    Ht 5\' 2"  (1.575 m)    Wt 65.8 kg    SpO2 99%    BMI 26.52 kg/m  Physical Exam Constitutional:      General: She is in  acute distress.     Appearance: She is well-developed.  HENT:     Head: Normocephalic and atraumatic.  Eyes:     Pupils: Pupils are equal, round, and reactive to light.     Comments: Twitching of the upper eyelids  Cardiovascular:     Rate and Rhythm: Normal rate and regular rhythm.     Heart sounds: Normal heart sounds.  Pulmonary:     Effort: Pulmonary effort is normal. No respiratory distress.     Breath sounds: Normal breath sounds. No wheezing or rales.  Chest:     Chest wall: No tenderness.  Abdominal:     General: Bowel sounds are normal.     Palpations: Abdomen is soft.     Tenderness: There is no abdominal tenderness. There is no guarding or rebound.  Musculoskeletal:        General: Normal range of motion.     Cervical back: Normal range of motion and neck supple.  Lymphadenopathy:     Cervical: No cervical adenopathy.  Skin:    General: Skin is warm and dry.     Findings: No rash.  Neurological:     Comments: Twitching of left arm, unresponsive    ED Results / Procedures / Treatments   Labs (all labs ordered are listed, but only abnormal results are displayed) Labs Reviewed  LIPASE, BLOOD - Abnormal; Notable for the following components:      Result Value   Lipase 10 (*)    All other components within normal limits  COMPREHENSIVE METABOLIC PANEL - Abnormal; Notable for the following components:   CO2 21 (*)    ALT 55 (*)    All other components within normal limits  URINALYSIS, ROUTINE W REFLEX MICROSCOPIC - Abnormal; Notable for the following components:   Ketones, ur TRACE (*)    All other components within normal limits  CBC  PREGNANCY, URINE  CBG MONITORING, ED    EKG None  Radiology No results found.  Procedures Procedures    Medications Ordered in ED Medications  LORazepam (ATIVAN) injection 1 mg (1 mg Intravenous Given 07/30/21 2028)  levETIRAcetam (KEPPRA) IVPB 1000 mg/100 mL premix (0 mg Intravenous Stopped 07/30/21 2102)    ED  Course/ Medical Decision Making/ A&P                           Medical Decision Making Amount and/or Complexity of Data Reviewed Independent Historian:     Details: Boyfriend External Data Reviewed: notes. Labs: ordered. Decision-making details documented in ED Course. Radiology: ordered.  Risk Parenteral controlled substances.   Patient is a 26 year old female who presents with right-sided abdominal pain.  However on my initial exam, she was  having active seizures.  She had some eye twitching and some left arm twitching.  She was given dose of Ativan and this resolved.  She was loaded with Keppra.  She had no further seizure activity.  Labs were obtained and reviewed by me which are nonconcerning.  Her electrolytes are nonconcerning.  Urine does not show any signs of infection.  There is no hematuria.  On reevaluation, she still has pain in her right mid and lower abdomen.  Will obtain CT scan.  Dr. Stark Jock to follow.  Final Clinical Impression(s) / ED Diagnoses Final diagnoses:  Seizure ([place])  Right lower quadrant abdominal pain    Rx / DC Orders ED Discharge Orders     None         Malvin Johns, MD 07/30/21 2320

## 2021-07-30 NOTE — ED Notes (Signed)
Pt started seizing at 2018. Charge nurse notified. Pt transported to resuscitation room. No injury during transportation. Placed on stretcher with rails paded and placed on monitor. VSS.

## 2021-07-30 NOTE — ED Notes (Signed)
RT to RESUS room for pt seizing. Pt began to desat down to 88% and placed on [place] 6 Lpm. Pt bilat BS diminished all fields r/t shallow breathing at this time. Pt sats on 6 Lpm 100%. Pt reassessed by this RT and O2 titrated down to 4 Lpm w/sats of 100%, Bilat BS clear/diminished at this time. RT reassessed pt and continued to titrate O2 down until pt was on RA w/sats remaining at 100%, Bilat BS clear/diminished. Pt informed RT that she does smoke for medicinal purpose related to her Epilepsy. Pts respiratory status stable w/no distress noted at this time post d/c of oxygen. RT will continue to monitor.

## 2021-07-31 ENCOUNTER — Telehealth (HOSPITAL_BASED_OUTPATIENT_CLINIC_OR_DEPARTMENT_OTHER): Payer: Self-pay | Admitting: Emergency Medicine

## 2021-07-31 MED ORDER — HYDROCODONE-ACETAMINOPHEN 5-325 MG PO TABS: 1.0000 | ORAL_TABLET | ORAL | 0 refills | Status: DC | PRN

## 2021-07-31 MED ORDER — HYDROCODONE-ACETAMINOPHEN 5-325 MG PO TABS: 1.0000 | ORAL_TABLET | Freq: Four times a day (QID) | ORAL | 0 refills | Status: DC | PRN

## 2021-07-31 NOTE — ED Provider Notes (Signed)
°  Physical Exam  BP (!) 98/56    Pulse 81    Temp 99.3 F (37.4 C) (Oral)    Resp 15    Ht 5\' 2"  (1.575 m)    Wt 65.8 kg    SpO2 100%    BMI 26.52 kg/m   Physical Exam Vitals and nursing note reviewed.  Constitutional:      General: She is not in acute distress.    Appearance: She is well-developed. She is not ill-appearing.  HENT:     Head: Normocephalic.  Skin:    General: Skin is warm and dry.  Neurological:     Mental Status: She is alert and oriented to person, place, and time.    Procedures  Procedures  ED Course / MDM    Care assumed from Dr. Tamera Punt at shift change.  Patient awaiting results of a CT scan.  Patient apparently developed sudden onset right-sided abdominal and flank pain earlier this morning.  She has history of seizures/pseudoseizures and apparently experienced 1 of these shortly after presentation to the ER.  She has been seizure-free since.  CT scan has resulted and is unremarkable.  She does have several tiny calculi within both kidneys.  It is possible that maybe she passed a stone, but nothing else appears emergent.  She seems to be feeling better after receiving pain medication and I believe appropriate for discharge.  Patient prescribed a small quantity of pain medication she can take if her pain returns and is to return to the ER if symptoms worsen or change.       Veryl Speak, MD 07/31/21 [phone]

## 2021-07-31 NOTE — Telephone Encounter (Signed)
Patient reportedly called to request her prescription being sent to [facility] in [place] that has a prescription available.  We will make that change for her.

## 2021-07-31 NOTE — ED Notes (Signed)
Pt verbalized complete relief of pain following morphine administration. No recurrent seizure activity. S/O remains at bedside. VS WDL

## 2021-07-31 NOTE — Discharge Instructions (Addendum)
Take hydrocodone as prescribed as needed for pain.  Continue other medications as previously prescribed.  Follow-up with your primary doctor next week, and return to the ER if symptoms significantly worsen or change.

## 2021-07-31 NOTE — ED Notes (Signed)
Pt verbalizes understanding of discharge instructions. Opportunity for questioning and answers were provided. Pt discharged from ED to home.   ? ?

## 2021-08-01 ENCOUNTER — Other Ambulatory Visit: Payer: Self-pay

## 2021-08-01 ENCOUNTER — Ambulatory Visit
Admission: EM | Admit: 2021-08-01 | Discharge: 2021-08-01 | Discharge status: Absent without leave | Payer: BC Managed Care – PPO

## 2021-08-01 ENCOUNTER — Telehealth (HOSPITAL_BASED_OUTPATIENT_CLINIC_OR_DEPARTMENT_OTHER): Payer: Self-pay | Admitting: Emergency Medicine

## 2021-08-01 ENCOUNTER — Other Ambulatory Visit (HOSPITAL_BASED_OUTPATIENT_CLINIC_OR_DEPARTMENT_OTHER): Payer: Self-pay

## 2021-08-01 MED ORDER — HYDROCODONE-ACETAMINOPHEN 5-325 MG PO TABS
1.0000 | ORAL_TABLET | Freq: Four times a day (QID) | ORAL | 0 refills | Status: DC | PRN
  Filled 2021-08-01: qty 10, 2d supply, fill #0

## 2021-08-01 NOTE — Telephone Encounter (Signed)
Patient was seen on 2/26 for abdominal pain.  She was prescribed a short course of Norco pain medication.  [facility]  Will change pharmacy [facility].  RN Urban Gibson will contact previous pharmacy to make sure that prescription is canceled.

## 2021-08-17 DIAGNOSIS — Z760 Encounter for issue of repeat prescription: Secondary | ICD-10-CM | POA: Diagnosis not present

## 2021-08-17 DIAGNOSIS — G40209 Localization-related (focal) (partial) symptomatic epilepsy and epileptic syndromes with complex partial seizures, not intractable, without status epilepticus: Secondary | ICD-10-CM | POA: Diagnosis not present

## 2021-08-17 DIAGNOSIS — G43909 Migraine, unspecified, not intractable, without status migrainosus: Secondary | ICD-10-CM | POA: Diagnosis not present

## 2021-08-17 DIAGNOSIS — F329 Major depressive disorder, single episode, unspecified: Secondary | ICD-10-CM | POA: Diagnosis not present

## 2021-09-02 ENCOUNTER — Other Ambulatory Visit: Payer: Self-pay

## 2021-09-02 ENCOUNTER — Encounter: Payer: Self-pay | Admitting: Emergency Medicine

## 2021-09-02 ENCOUNTER — Ambulatory Visit
Admission: EM | Admit: 2021-09-02 | Discharge: 2021-09-02 | Disposition: A | Discharge status: Home / Self Care | Payer: BC Managed Care – PPO | Attending: Emergency Medicine | Admitting: Emergency Medicine

## 2021-09-02 DIAGNOSIS — Z20818 Contact with and (suspected) exposure to other bacterial communicable diseases: Secondary | ICD-10-CM

## 2021-09-02 DIAGNOSIS — J029 Acute pharyngitis, unspecified: Secondary | ICD-10-CM | POA: Diagnosis not present

## 2021-09-02 LAB — POCT RAPID STREP A (OFFICE): Rapid Strep A Screen: NEGATIVE

## 2021-09-02 MED ORDER — PENICILLIN V POTASSIUM 500 MG PO TABS: 500.0000 mg | ORAL_TABLET | Freq: Two times a day (BID) | ORAL | 0 refills | Status: AC

## 2021-09-02 MED ORDER — FLUTICASONE PROPIONATE 50 MCG/ACT NA SUSP: 2.0000 | Freq: Every day | NASAL | 0 refills | Status: DC

## 2021-09-02 NOTE — Discharge Instructions (Addendum)
finish the antibiotics, even if you feel better.  Flonase, saline nasal irrigation with a NeilMed sinus rinse and distilled water as often as you want for the nasal congestion, 500 mg to 1000 mg of Tylenol 3-4 times a day as needed for sore throat.  Benadryl/Maalox mixture-5 cc of each mixed together, gargle and swallow.  Do this 3-4 times a day. ?

## 2021-09-02 NOTE — ED Provider Notes (Signed)
HPI ? ?SUBJECTIVE: ? ?Pamela Booker is a 26 y.o. female who presents with  3 days of sore throat, nasal congestion, clear rhinorrhea, sinus pressure, postnasal drip, fevers Tmax 100, itchy eyes.  She thought it was allergies/sinuses, but her daughter, who also has similar symptoms, tested positive for strep today.  No cough.  No drooling, trismus, voice changes, neck stiffness, sensation of throat swelling shut.  No antibiotics in the past month.  No antipyretic in the past 6 hours.  Patient has been doing humidifier and Tylenol without improvement in her symptoms.  Symptoms are worse with exposure to known allergens.  She has a past medical history of seizures, is status post tonsillectomy, has a history of endometriosis.  LMP: 1 month ago.  Denies the possibility being pregnant.  PCP: Her GYN ? ? ?Past Medical History:  ?Diagnosis Date  ? Anxiety   ? Encounter for loop recorder check 06/10/2019  ? Epilepsy ([place])   ? Hypertension   ? Loop: Biotronic loop recorder in situ 05/06/2019  ? Scheduled Remote loop recorder check 06/09/2019: This represents an unremarkable monitoring session. The presenting rhythm is not available. No symptomatic or automatic episodes were triggered. Daily activity trends are stable. Heart rate variability trends are stable.  ? Migraines   ? Palpitations   ? PVC (premature ventricular contraction)   ? PVC's (premature ventricular contractions)   ? Seizures ([facility])   ? Syncope and collapse 06/10/2019  ? ? ?Past Surgical History:  ?Procedure Laterality Date  ? LAPAROSCOPY    ? TONSILLECTOMY    ? VAGUS NERVE STIMULATOR INSERTION    ? ? ?Family History  ?Adopted: Yes  ?Problem Relation Age of Onset  ? Healthy Daughter   ? ? ?Social History  ? ?Tobacco Use  ? Smoking status: Never  ? Smokeless tobacco: Never  ?Vaping Use  ? Vaping Use: Never used  ?Substance Use Topics  ? Alcohol use: Yes  ?  Comment: socially  ? Drug use: No  ? ? ?No current facility-administered medications for this  encounter. ? ?Current Outpatient Medications:  ?  fluticasone (FLONASE) 50 MCG/ACT nasal spray, Place 2 sprays into both nostrils daily., Disp: 16 g, Rfl: 0 ?  penicillin v potassium (VEETID) 500 MG tablet, Take 1 tablet (500 mg total) by mouth 2 (two) times daily for 10 days. X 10 days, Disp: 20 tablet, Rfl: 0 ?  acebutolol (SECTRAL) 200 MG capsule, TAKE ONE CAPSULE BY MOUTH TWICE A DAY DISCONTINUE VERAPAMIL (Patient taking differently: Take 200 mg by mouth 2 (two) times daily. TAKE ONE CAPSULE BY MOUTH TWICE A DAY DISCONTINUE VERAPAMIL), Disp: 60 capsule, Rfl: 3 ?  buPROPion (WELLBUTRIN XL) 300 MG 24 hr tablet, Take 300 mg by mouth every morning., Disp: , Rfl:  ?  butalbital-acetaminophen-caffeine (FIORICET) 50-325-40 MG tablet, Take 1-2 tablets by mouth every 6 (six) hours as needed for headache., Disp: 10 tablet, Rfl: 0 ?  diazepam (VALIUM) 5 MG tablet, Take 5 mg by mouth 2 (two) times daily as needed for muscle spasms., Disp: , Rfl:  ?  escitalopram (LEXAPRO) 20 MG tablet, Take 20 mg by mouth daily. , Disp: , Rfl:  ?  estradiol (VIVELLE-DOT) 0.075 MG/24HR, Place 1 patch onto the skin as needed (period bleeding)., Disp: , Rfl:  ?  HYDROcodone-acetaminophen (NORCO/VICODIN) 5-325 MG tablet, Take 1 to 2 tablets by mouth every 6 (six) hours as needed., Disp: 10 tablet, Rfl: 0 ?  levETIRAcetam (KEPPRA) 500 MG tablet, Take 1 tablet (500 mg  total) by mouth 2 (two) times daily., Disp: 60 tablet, Rfl: 0 ?  norethindrone (AYGESTIN) 5 MG tablet, Take 5 mg by mouth daily., Disp: , Rfl:  ?  PRESCRIPTION MEDICATION, Apply 1 application topically every other day. Apply to face at night - Hers 20 ml Acne ( pump) Az/Zp (0.0125%), Disp: , Rfl:  ?  rizatriptan (MAXALT-MLT) 10 MG disintegrating tablet, Take 10 mg by mouth daily as needed for migraine. , Disp: , Rfl:  ?  tiZANidine (ZANAFLEX) 4 MG tablet, Take 4-8 mg by mouth as needed for muscle spasms., Disp: , Rfl:  ?  topiramate (TOPAMAX) 100 MG tablet, Take 100 mg by mouth  daily., Disp: , Rfl:  ?  valACYclovir (VALTREX) 500 MG tablet, Take 500 mg by mouth daily., Disp: , Rfl:  ?  XCOPRI 14 x 150 MG & 14 x200 MG TBPK, Take 200 mg by mouth at bedtime., Disp: , Rfl:  ? ?Allergies  ?Allergen Reactions  ? Nsaids Other (See Comments)  ?  History of bleeding ulcers  ? Benadryl [Diphenhydramine] Other (See Comments)  ?  "Crawling feeling under skin"  ? Ibuprofen   ?  Other reaction(s): GI Upset (intolerance)  ? Emgality [Galcanezumab-Gnlm] Rash  ? ? ? ?ROS ? ?As noted in HPI.  ? ?Physical Exam ? ?BP 105/68   Pulse 64   Temp 98.8 ?F (37.1 ?C)   Resp 18   SpO2 99%  ? ?Constitutional: Well developed, well nourished, no acute distress ?Eyes:  EOMI, conjunctiva normal bilaterally ?HENT: Normocephalic, atraumatic,mucus membranes moist. .  Positive nasal congestion.  Erythematous oropharynx.  Tonsils surgically absent.  Uvula midline.  Positive cobblestoning.  No obvious postnasal drip.  ?Neck: No cervical lymphadenopathy ?Respiratory: Normal inspiratory effort ?Cardiovascular: Normal rate, regular rhythm no murmur ?GI: nondistended soft, no splenomegaly ?skin: No rash, skin intact ?Musculoskeletal: no deformities ?Neurologic: Alert & oriented x 3, no focal neuro deficits ?Psychiatric: Speech and behavior appropriate ? ? ?ED Course ? ? ?Medications - No data to display ? ?Orders Placed This Encounter  ?Procedures  ? POCT rapid strep A  ?  Standing Status:   Standing  ?  Number of Occurrences:   1  ? ? ?Results for orders placed or performed during the hospital encounter of 09/02/21 (from the past 24 hour(s))  ?POCT rapid strep A     Status: None  ? Collection Time: 09/02/21  6:23 PM  ?Result Value Ref Range  ? Rapid Strep A Screen Negative Negative  ? ?No results found. ? ?ED Clinical Impression ? ?1. Sore throat   ?2. Exposure to strep throat   ?  ? ?ED Assessment/Plan ? ?Rapid strep negative.  However, because of the close exposure to strep throat and patient states that her seizures get worse  whenever she becomes ill, will presumptively for strep throat with penicillin, although I suspect that allergies are contributing to her sore throat.  Advised Flonase, saline nasal irrigation, Tylenol, Benadryl/Maalox mixture.  She has an allergy to ibuprofen.  Follow-up with her PCP as needed.  ER return precautions given. ? ?Discussed labs, imaging, MDM, treatment plan, and plan for follow-up with patient.  patient agrees with plan.  ? ?Meds ordered this encounter  ?Medications  ? penicillin v potassium (VEETID) 500 MG tablet  ?  Sig: Take 1 tablet (500 mg total) by mouth 2 (two) times daily for 10 days. X 10 days  ?  Dispense:  20 tablet  ?  Refill:  0  ? fluticasone (  FLONASE) 50 MCG/ACT nasal spray  ?  Sig: Place 2 sprays into both nostrils daily.  ?  Dispense:  16 g  ?  Refill:  0  ? ? ? ? ?*This clinic note was created using [employer]. Therefore, there may be occasional mistakes despite careful proofreading. ? ?? ? ?  ?Melynda Ripple, MD ?09/02/21 1853 ? ?

## 2021-09-02 NOTE — ED Triage Notes (Signed)
Pt here with sore throat x today. Daughter is strep positive.  ?

## 2021-09-08 ENCOUNTER — Emergency Department
Admission: EM | Admit: 2021-09-08 | Discharge: 2021-09-08 | Disposition: A | Discharge status: Home / Self Care | Payer: BC Managed Care – PPO | Attending: Emergency Medicine | Admitting: Emergency Medicine

## 2021-09-08 ENCOUNTER — Encounter: Payer: Self-pay | Admitting: Emergency Medicine

## 2021-09-08 ENCOUNTER — Other Ambulatory Visit: Payer: Self-pay

## 2021-09-08 DIAGNOSIS — G40909 Epilepsy, unspecified, not intractable, without status epilepticus: Secondary | ICD-10-CM | POA: Insufficient documentation

## 2021-09-08 DIAGNOSIS — R569 Unspecified convulsions: Secondary | ICD-10-CM | POA: Diagnosis not present

## 2021-09-08 DIAGNOSIS — R404 Transient alteration of awareness: Secondary | ICD-10-CM | POA: Diagnosis not present

## 2021-09-08 LAB — CBC WITH DIFFERENTIAL/PLATELET
Abs Immature Granulocytes: 0.01 10*3/uL (ref 0.00–0.07)
Basophils Absolute: 0 10*3/uL (ref 0.0–0.1)
Basophils Relative: 1 %
Eosinophils Absolute: 0.1 10*3/uL (ref 0.0–0.5)
Eosinophils Relative: 1 %
HCT: 39.7 % (ref 36.0–46.0)
Hemoglobin: 13.3 g/dL (ref 12.0–15.0)
Immature Granulocytes: 0 %
Lymphocytes Relative: 39 %
Lymphs Abs: 2 10*3/uL (ref 0.7–4.0)
MCH: 32.4 pg (ref 26.0–34.0)
MCHC: 33.5 g/dL (ref 30.0–36.0)
MCV: 96.6 fL (ref 80.0–100.0)
Monocytes Absolute: 0.4 10*3/uL (ref 0.1–1.0)
Monocytes Relative: 8 %
Neutro Abs: 2.7 10*3/uL (ref 1.7–7.7)
Neutrophils Relative %: 51 %
Platelets: 230 10*3/uL (ref 150–400)
RBC: 4.11 MIL/uL (ref 3.87–5.11)
RDW: 12.6 % (ref 11.5–15.5)
WBC: 5.1 10*3/uL (ref 4.0–10.5)
nRBC: 0 % (ref 0.0–0.2)

## 2021-09-08 LAB — BASIC METABOLIC PANEL
Anion gap: 10 (ref 5–15)
BUN: 8 mg/dL (ref 6–20)
CO2: 20 mmol/L — ABNORMAL LOW (ref 22–32)
Calcium: 9.4 mg/dL (ref 8.9–10.3)
Chloride: 108 mmol/L (ref 98–111)
Creatinine, Ser: 0.7 mg/dL (ref 0.44–1.00)
GFR, Estimated: >60 mL/min (ref >60)
Glucose, Bld: 86 mg/dL (ref 70–99)
Potassium: 3.8 mmol/L (ref 3.5–5.1)
Sodium: 138 mmol/L (ref 135–145)

## 2021-09-08 LAB — ETHANOL: Alcohol, Ethyl (B): <10 mg/dL (ref <10)

## 2021-09-08 MED ORDER — TOPIRAMATE 25 MG PO TABS
100.0000 mg | ORAL_TABLET | Freq: Once | ORAL | Status: AC
  Administered 2021-09-08: 100 mg via ORAL
  Filled 2021-09-08: qty 4

## 2021-09-08 MED ORDER — PROCHLORPERAZINE EDISYLATE 10 MG/2ML IJ SOLN
10.0000 mg | Freq: Once | INTRAMUSCULAR | Status: AC
  Administered 2021-09-08: 10 mg via INTRAVENOUS
  Filled 2021-09-08: qty 2

## 2021-09-08 MED ORDER — ACETAMINOPHEN 500 MG PO TABS
1000.0000 mg | ORAL_TABLET | Freq: Once | ORAL | Status: AC
Start: 2021-09-08 — End: 2021-09-08
  Administered 2021-09-08: 1000 mg via ORAL
  Filled 2021-09-08: qty 2

## 2021-09-08 MED ORDER — LEVETIRACETAM 500 MG PO TABS
500.0000 mg | ORAL_TABLET | Freq: Once | ORAL | Status: AC
Start: 2021-09-08 — End: 2021-09-08
  Administered 2021-09-08: 500 mg via ORAL
  Filled 2021-09-08: qty 1

## 2021-09-08 NOTE — Discharge Instructions (Addendum)
Please seek medical attention for any high fevers, chest pain, shortness of breath, change in behavior, persistent vomiting, bloody stool or any other new or concerning symptoms.  

## 2021-09-08 NOTE — ED Triage Notes (Signed)
BIB EMS from home  ?Seizure last night approx 15 mins witnessed by 89yr old child. EMS was at residence last night for seizure call. Pt postictal but responding to questions by nodding.  ?Versed 2.5mg  given IM  ?Cbg 88 ?

## 2021-09-08 NOTE — ED Notes (Signed)
Dc instructions reviewed with pt no questions or concerns at this time. Will follow up with neurology 

## 2021-09-08 NOTE — ED Provider Notes (Signed)
? ?[facility] ?Provider Note ? ? ? Event Date/Time  ? First MD Initiated Contact with Patient 09/08/21 1553   ?  (approximate) ? ? ?History  ? ?Seizures ? ? ?HPI ? ?Pamela Booker is a 26 y.o. female  who, per neurology note dated 07/05/2021 has history of epilepsy and nonepileptic events, who presents to the emergency department today because of concern for seizure like activity. The patient states that she had four last night and then had another one today. The patient states that she did not take her antiepileptics this morning because she was tired. She says she is currently being treated for strep throat.  ? ?Physical Exam  ? ?Triage Vital Signs: ?ED Triage Vitals  ?Enc Vitals Group  ?   BP 09/08/21 1548 102/74  ?   Pulse Rate 09/08/21 1545 76  ?   Resp 09/08/21 1545 18  ?   Temp 09/08/21 1546 99.2 ?F (37.3 ?C)  ?   Temp Source 09/08/21 1546 Oral  ?   SpO2 09/08/21 1545 100 %  ?   Weight 09/08/21 1547 145 lb 1 oz (65.8 kg)  ?   Height 09/08/21 1547 5\' 2"  (1.575 m)  ?   Head Circumference --   ?   Peak Flow --   ?   Pain Score 09/08/21 1547 10  ? ?Most recent vital signs: ?Vitals:  ? 09/08/21 1546 09/08/21 1548  ?BP:  102/74  ?Pulse: 74   ?Resp: 16   ?Temp: 99.2 ?F (37.3 ?C)   ?SpO2:    ? ? ?General: Awake, no distress.  ?CV:  Good peripheral perfusion. Regular rate and rhythm ?Resp:  Normal effort. Lungs clear ?Abd:  No distention.  ?Neuro:  Awake alert and oriented.  ? ? ?ED Results / Procedures / Treatments  ? ?Labs ?(all labs ordered are listed, but only abnormal results are displayed) ?Labs Reviewed  ?BASIC METABOLIC PANEL - Abnormal; Notable for the following components:  ?    Result Value  ? CO2 20 (*)   ? All other components within normal limits  ?CBC WITH DIFFERENTIAL/PLATELET  ?ETHANOL  ?URINE DRUG SCREEN, QUALITATIVE ([facility] ONLY)  ? ? ? ?EKG ? ?I06/06/23, attending physician, personally viewed and interpreted this EKG ? ?EKG Time: 1547 ?Rate: 77 ?Rhythm: sinus  rhythm ?Axis: normal ?Intervals: qtc 410 ?QRS: narrow, RSR' in V1, v2 ?ST changes: no st elevation ?Impression: abnormal ekg ? ?RADIOLOGY ?None ? ?PROCEDURES: ? ?Critical Care performed: No ? ?Procedures ? ? ?MEDICATIONS ORDERED IN ED: ?Medications - No data to display ? ? ?IMPRESSION / MDM / ASSESSMENT AND PLAN / ED COURSE  ?I reviewed the triage vital signs and the nursing notes. ?             ?               ? ?Differential diagnosis includes, but is not limited to, seizure, nonepileptic episode, syncope. ? ?Patient presents after apparent seizure like episode. The patient has history of seizures and nonepilictic seizure like activity. On exam patient is not post ictal. States she had not taken her medication for seizures this morning. Blood work today without any concerning electrolyte abnormality. No leukocytosis. Did give patient her home seizure medication. Gave medication for headache. Patient did feel better after medication. Will plan on discharging home to follow up with neurology. ? ?FINAL CLINICAL IMPRESSION(S) / ED DIAGNOSES  ? ?Final diagnoses:  ?Seizure-like activity ([facility])  ? ? ? ? ?  Note:  This document was prepared using Dragon voice recognition software and may include unintentional dictation errors. ? ?  ?Phineas Semen, MD ?09/08/21 1744 ? ?

## 2021-09-15 ENCOUNTER — Other Ambulatory Visit: Payer: Self-pay

## 2021-09-15 ENCOUNTER — Encounter (HOSPITAL_BASED_OUTPATIENT_CLINIC_OR_DEPARTMENT_OTHER): Payer: Self-pay | Admitting: Urology

## 2021-09-15 ENCOUNTER — Emergency Department (HOSPITAL_BASED_OUTPATIENT_CLINIC_OR_DEPARTMENT_OTHER)
Admission: EM | Admit: 2021-09-15 | Discharge: 2021-09-15 | Disposition: A | Discharge status: Home / Self Care | Payer: BC Managed Care – PPO | Attending: Emergency Medicine | Admitting: Emergency Medicine

## 2021-09-15 DIAGNOSIS — R569 Unspecified convulsions: Secondary | ICD-10-CM | POA: Insufficient documentation

## 2021-09-15 DIAGNOSIS — R9431 Abnormal electrocardiogram [ECG] [EKG]: Secondary | ICD-10-CM | POA: Diagnosis not present

## 2021-09-15 DIAGNOSIS — R519 Headache, unspecified: Secondary | ICD-10-CM | POA: Diagnosis not present

## 2021-09-15 LAB — CBC WITH DIFFERENTIAL/PLATELET
Abs Immature Granulocytes: 0.01 10*3/uL (ref 0.00–0.07)
Basophils Absolute: 0 10*3/uL (ref 0.0–0.1)
Basophils Relative: 0 %
Eosinophils Absolute: 0.1 10*3/uL (ref 0.0–0.5)
Eosinophils Relative: 2 %
HCT: 39.7 % (ref 36.0–46.0)
Hemoglobin: 13.8 g/dL (ref 12.0–15.0)
Immature Granulocytes: 0 %
Lymphocytes Relative: 42 %
Lymphs Abs: 2.7 10*3/uL (ref 0.7–4.0)
MCH: 33.6 pg (ref 26.0–34.0)
MCHC: 34.8 g/dL (ref 30.0–36.0)
MCV: 96.6 fL (ref 80.0–100.0)
Monocytes Absolute: 0.5 10*3/uL (ref 0.1–1.0)
Monocytes Relative: 7 %
Neutro Abs: 3.1 10*3/uL (ref 1.7–7.7)
Neutrophils Relative %: 49 %
Platelets: 245 10*3/uL (ref 150–400)
RBC: 4.11 MIL/uL (ref 3.87–5.11)
RDW: 12.8 % (ref 11.5–15.5)
WBC: 6.4 10*3/uL (ref 4.0–10.5)
nRBC: 0 % (ref 0.0–0.2)

## 2021-09-15 LAB — BASIC METABOLIC PANEL
Anion gap: 7 (ref 5–15)
BUN: 10 mg/dL (ref 6–20)
CO2: 22 mmol/L (ref 22–32)
Calcium: 9.3 mg/dL (ref 8.9–10.3)
Chloride: 109 mmol/L (ref 98–111)
Creatinine, Ser: 0.71 mg/dL (ref 0.44–1.00)
GFR, Estimated: >60 mL/min (ref >60)
Glucose, Bld: 95 mg/dL (ref 70–99)
Potassium: 3.7 mmol/L (ref 3.5–5.1)
Sodium: 138 mmol/L (ref 135–145)

## 2021-09-15 LAB — CBG MONITORING, ED: Glucose-Capillary: 85 mg/dL (ref 70–99)

## 2021-09-15 LAB — PREGNANCY, URINE: Preg Test, Ur: NEGATIVE

## 2021-09-15 MED ORDER — OXYCODONE HCL 5 MG PO TABS: 5.0000 mg | ORAL_TABLET | Freq: Three times a day (TID) | ORAL | 0 refills | Status: DC | PRN

## 2021-09-15 MED ORDER — LEVETIRACETAM 500 MG PO TABS
500.0000 mg | ORAL_TABLET | Freq: Once | ORAL | Status: AC
  Administered 2021-09-15: 500 mg via ORAL
  Filled 2021-09-15: qty 1

## 2021-09-15 MED ORDER — HYDROCODONE-ACETAMINOPHEN 5-325 MG PO TABS
1.0000 | ORAL_TABLET | Freq: Once | ORAL | Status: AC
  Administered 2021-09-15: 1 via ORAL
  Filled 2021-09-15: qty 1

## 2021-09-15 NOTE — Discharge Instructions (Addendum)
Take Roxicodone for breakthrough pain.  This is a narcotic pain medicine, do not mix with alcohol or other drugs.  Follow-up with your primary care doctor and neurologist. ?

## 2021-09-15 NOTE — ED Notes (Signed)
Patient discharged to home.  All discharge instructions reviewed.  Patient verbalized understanding via teachback method.  VS WDL.  Respirations even and unlabored.  Ambulatory out of ED.   °

## 2021-09-15 NOTE — ED Triage Notes (Signed)
Seizure at 1630, h/o epilepsy  ?Pt given nasalam at time of siezure and choked ?Pt reports fatigued, states lower abdominal pain and headache ?

## 2021-09-15 NOTE — ED Provider Notes (Signed)
?[facility] EMERGENCY DEPARTMENT ?Provider Note ? ? ?CSN: [id number] ?Arrival date & time: 09/15/21  1826 ? ?  ? ?History ? ?Chief Complaint  ?Patient presents with  ? Seizures  ? ? ?Pamela Booker is a 26 y.o. female. ? ?Here with breakthrough seizures.  Seizures have resolved.  States that she just finished her menstrual cycle which she thinks is a trigger for her seizures.  Did not hit her head.  Does not have any extremity pain.  Denies any nausea, vomiting.  She has had some chronic abdominal pain/endometriosis pain which she says has been bad during her menstrual cycle. ? ?The history is provided by the patient.  ?Seizures ?Seizure activity on arrival: no   ?Seizure type:  Unable to specify ?Episode characteristics: abnormal movements   ?Return to baseline: yes   ?Severity:  Mild ?Timing:  Once ?Progression:  Resolved ?Context: medical compliance   ?Recent head injury:  No recent head injuries ?PTA treatment:  Midazolam ? ?  ? ?Home Medications ?Prior to Admission medications   ?Medication Sig Start Date End Date Taking? Authorizing Provider  ?oxyCODONE (ROXICODONE) 5 MG immediate release tablet Take 1 tablet (5 mg total) by mouth every 8 (eight) hours as needed for up to 6 doses for breakthrough pain. 09/15/21  Yes , , DO  ?acebutolol (SECTRAL) 200 MG capsule TAKE ONE CAPSULE BY MOUTH TWICE A DAY DISCONTINUE VERAPAMIL ?Patient taking differently: Take 200 mg by mouth 2 (two) times daily. TAKE ONE CAPSULE BY MOUTH TWICE A DAY DISCONTINUE VERAPAMIL 06/02/21   Cantwell, Celeste C, PA-C  ?buPROPion (WELLBUTRIN XL) 300 MG 24 hr tablet Take 300 mg by mouth every morning. 11/03/20   [provider]  ?butalbital-acetaminophen-caffeine (FIORICET) 50-325-40 MG tablet Take 1-2 tablets by mouth every 6 (six) hours as needed for headache. 12/04/20   Gilda Crease, MD  ?diazepam (VALIUM) 5 MG tablet Take 5 mg by mouth 2 (two) times daily as needed for muscle spasms. 05/31/21    [provider]  ?escitalopram (LEXAPRO) 20 MG tablet Take 20 mg by mouth daily.  08/23/18   [provider]  ?estradiol (VIVELLE-DOT) 0.075 MG/24HR Place 1 patch onto the skin as needed (period bleeding). 03/02/21   [provider]  ?fluticasone (FLONASE) 50 MCG/ACT nasal spray Place 2 sprays into both nostrils daily. 09/02/21   Domenick Gong, MD  ?HYDROcodone-acetaminophen (NORCO/VICODIN) 5-325 MG tablet Take 1 to 2 tablets by mouth every 6 (six) hours as needed. 08/01/21   Haskel Schroeder, PA-C  ?levETIRAcetam (KEPPRA) 500 MG tablet Take 1 tablet (500 mg total) by mouth 2 (two) times daily. 05/06/20   Gerhard Munch, MD  ?norethindrone (AYGESTIN) 5 MG tablet Take 5 mg by mouth daily. 02/26/19 07/05/21  [provider]  ?PRESCRIPTION MEDICATION Apply 1 application topically every other day. Apply to face at night - Hers 20 ml Acne ( pump) Az/Zp (0.0125%)    [provider]  ?rizatriptan (MAXALT-MLT) 10 MG disintegrating tablet Take 10 mg by mouth daily as needed for migraine.  03/17/19   [provider]  ?tiZANidine (ZANAFLEX) 4 MG tablet Take 4-8 mg by mouth as needed for muscle spasms. 06/08/21   [provider]  ?topiramate (TOPAMAX) 100 MG tablet Take 100 mg by mouth daily.    [provider]  ?valACYclovir (VALTREX) 500 MG tablet Take 500 mg by mouth daily.    [provider]  ?XCOPRI 14 x 150 MG & 14 x200 MG TBPK Take 200 mg  by mouth at bedtime. 11/11/20   [provider]  ?omeprazole (PRILOSEC) 20 MG capsule Take 1 capsule (20 mg total) by mouth daily for 14 days. ?Patient not taking: Reported on 05/06/2020 11/03/19 10/02/20  Darr, Edison Nasuti, PA-C  ?   ? ?Allergies    ?Nsaids, Benadryl [diphenhydramine], Compazine [prochlorperazine], Ibuprofen, and Emgality [galcanezumab-gnlm]   ? ?Review of Systems   ?Review of Systems  ?Neurological:  Positive for seizures.  ? ?Physical Exam ?Updated Vital Signs ?BP 108/62   Pulse 74    Temp 98.2 ?F (36.8 ?C) (Oral)   Resp (!) 28   Ht 5\' 2"  (1.575 m)   Wt 63.5 kg   SpO2 98%   BMI 25.61 kg/m?  ?Physical Exam ?Vitals and nursing note reviewed.  ?Constitutional:   ?   General: She is not in acute distress. ?   Appearance: She is well-developed. She is not ill-appearing.  ?HENT:  ?   Head: Normocephalic and atraumatic.  ?   Nose: Nose normal.  ?   Mouth/Throat:  ?   Mouth: Mucous membranes are moist.  ?Eyes:  ?   Extraocular Movements: Extraocular movements intact.  ?   Conjunctiva/sclera: Conjunctivae normal.  ?   Pupils: Pupils are equal, round, and reactive to light.  ?Cardiovascular:  ?   Rate and Rhythm: Normal rate and regular rhythm.  ?   Heart sounds: No murmur heard. ?Pulmonary:  ?   Effort: Pulmonary effort is normal. No respiratory distress.  ?   Breath sounds: Normal breath sounds.  ?Abdominal:  ?   Palpations: Abdomen is soft.  ?   Tenderness: There is no abdominal tenderness.  ?Musculoskeletal:     ?   General: No swelling.  ?   Cervical back: Normal range of motion and neck supple.  ?Skin: ?   General: Skin is warm and dry.  ?   Capillary Refill: Capillary refill takes less than 2 seconds.  ?Neurological:  ?   General: No focal deficit present.  ?   Mental Status: She is alert and oriented to person, place, and time.  ?   Cranial Nerves: No cranial nerve deficit.  ?   Sensory: No sensory deficit.  ?   Motor: No weakness.  ?   Coordination: Coordination normal.  ?   Comments: 5+ out of 5 strength throughout, normal sensation, no drift, normal finger-nose-finger, normal speech  ?Psychiatric:     ?   Mood and Affect: Mood normal.  ? ? ?ED Results / Procedures / Treatments   ?Labs ?(all labs ordered are listed, but only abnormal results are displayed) ?Labs Reviewed  ?CBC WITH DIFFERENTIAL/PLATELET  ?BASIC METABOLIC PANEL  ?PREGNANCY, URINE  ?CBG MONITORING, ED  ? ? ?EKG ?EKG Interpretation ? ?Date/Time:  Thursday September 15 2021 18:44:23 EDT ?Ventricular Rate:  78 ?PR Interval:  127 ?QRS  Duration: 81 ?QT Interval:  371 ?QTC Calculation: 423 ?R Axis:   55 ?Text Interpretation: Sinus rhythm Confirmed by Lennice Sites [phone]) on 09/15/2021 6:59:45 PM ? ?Radiology ?No results found. ? ?Procedures ?Procedures  ? ? ?Medications Ordered in ED ?Medications  ?levETIRAcetam (KEPPRA) tablet 500 mg (500 mg Oral Given 09/15/21 1849)  ?HYDROcodone-acetaminophen (NORCO/VICODIN) 5-325 MG per tablet 1 tablet (1 tablet Oral Given 09/15/21 1854)  ? ? ?ED Course/ Medical Decision Making/ A&P ?  ?                        ?Medical Decision Making ?Amount and/or Complexity  of Data Reviewed ?Labs: ordered. ? ?Risk ?Prescription drug management. ? ? ?Pamela Booker is here following suspected seizure activity.  Normal vitals.  No fever.  History per chart review of nonepileptic and epileptic seizures.  Recently admitted for LTM that showed no signs of seizure activity.  She states she is under a lot of stress.  Having prolonged menstrual cycle and worsening endometriosis pain.  Feels like her cycle has now improved but sometimes this is a trigger for seizures.  Neurologically she is intact.  She has a mild headache.  Abdominal exam is benign.  We will check basic labs, urine pregnancy test.  EKG per my review and interpretation shows sinus rhythm.  No ischemic changes.  Blood sugar is normal.  Overall likely breakthrough seizures.  We will give her dose of Keppra.  Will give narcotic for pain medicine. ? ?Lab work is overall unremarkable per my review and interpretation.  No significant anemia, electrolyte abnormality, kidney injury.  Patient fully back to baseline.  We will write her for Roxicodone for breakthrough pain.  Discharged in good condition. ? ?This chart was dictated using voice recognition software.  Despite best efforts to proofread,  errors can occur which can change the documentation meaning.  ? ? ? ? ? ? ? ?Final Clinical Impression(s) / ED Diagnoses ?Final diagnoses:  ?Seizure-like activity ([place])   ? ? ?Rx / DC Orders ?ED Discharge Orders   ? ?      Ordered  ?  oxyCODONE (ROXICODONE) 5 MG immediate release tablet  Every 8 hours PRN       ? 09/15/21 2009  ? ?  ?  ? ?  ? ? ?  ?Lennice Sites, DO ?09/15/21 2010 ?

## 2021-09-22 DIAGNOSIS — G43909 Migraine, unspecified, not intractable, without status migrainosus: Secondary | ICD-10-CM | POA: Diagnosis not present

## 2021-09-22 DIAGNOSIS — G40209 Localization-related (focal) (partial) symptomatic epilepsy and epileptic syndromes with complex partial seizures, not intractable, without status epilepticus: Secondary | ICD-10-CM | POA: Diagnosis not present

## 2021-09-22 DIAGNOSIS — F329 Major depressive disorder, single episode, unspecified: Secondary | ICD-10-CM | POA: Diagnosis not present

## 2021-09-22 DIAGNOSIS — M545 Low back pain, unspecified: Secondary | ICD-10-CM | POA: Diagnosis not present

## 2021-09-22 DIAGNOSIS — Z79899 Other long term (current) drug therapy: Secondary | ICD-10-CM | POA: Diagnosis not present

## 2021-09-28 DIAGNOSIS — J029 Acute pharyngitis, unspecified: Secondary | ICD-10-CM | POA: Diagnosis not present

## 2021-09-28 DIAGNOSIS — H66001 Acute suppurative otitis media without spontaneous rupture of ear drum, right ear: Secondary | ICD-10-CM | POA: Diagnosis not present

## 2021-09-28 DIAGNOSIS — R0989 Other specified symptoms and signs involving the circulatory and respiratory systems: Secondary | ICD-10-CM | POA: Diagnosis not present

## 2021-10-05 ENCOUNTER — Encounter (HOSPITAL_COMMUNITY): Payer: Self-pay | Admitting: Emergency Medicine

## 2021-10-05 ENCOUNTER — Ambulatory Visit (HOSPITAL_COMMUNITY)
Admission: EM | Admit: 2021-10-05 | Discharge: 2021-10-05 | Disposition: A | Discharge status: Home / Self Care | Payer: BC Managed Care – PPO | Attending: Family Medicine | Admitting: Family Medicine

## 2021-10-05 DIAGNOSIS — H9203 Otalgia, bilateral: Secondary | ICD-10-CM

## 2021-10-05 DIAGNOSIS — H60393 Other infective otitis externa, bilateral: Secondary | ICD-10-CM

## 2021-10-05 DIAGNOSIS — H6503 Acute serous otitis media, bilateral: Secondary | ICD-10-CM

## 2021-10-05 MED ORDER — CIPROFLOXACIN-DEXAMETHASONE 0.3-0.1 % OT SUSP
4.0000 [drp] | Freq: Two times a day (BID) | OTIC | 0 refills | Status: DC
Start: 2021-10-05 — End: 2022-04-14

## 2021-10-05 MED ORDER — PREDNISONE 20 MG PO TABS: 40.0000 mg | ORAL_TABLET | Freq: Every day | ORAL | 0 refills | Status: DC

## 2021-10-05 NOTE — ED Triage Notes (Signed)
Pt c/o bilat ear pain and muffled sounds in bilat ear that hasnt gotten better after taking antibiotics for ear infections.  ?

## 2021-10-08 NOTE — ED Provider Notes (Signed)
?[facility] ? ? ?[id number] ?10/05/21 Arrival Time: 1830 ? ?ASSESSMENT & PLAN: ? ?1. Otalgia of both ears   ?2. Non-recurrent acute serous otitis media of both ears   ?3. Infective otitis externa of both ears   ? ?She will finish amoxicillin. ?Begin: ?Meds ordered this encounter  ?Medications  ? predniSONE (DELTASONE) 20 MG tablet  ?  Sig: Take 2 tablets (40 mg total) by mouth daily.  ?  Dispense:  10 tablet  ?  Refill:  0  ? ciprofloxacin-dexamethasone (CIPRODEX) OTIC suspension  ?  Sig: Place 4 drops into both ears 2 (two) times daily.  ?  Dispense:  7.5 mL  ?  Refill:  0  ? ?Recommend ENT f/u if not improving. ? ? ?Reviewed expectations re: course of current medical issues. Questions answered. ?Outlined signs and symptoms indicating need for more acute intervention. ?Patient verbalized understanding. ?After Visit Summary given. ? ? ?SUBJECTIVE: ?History from: patient. ? ?Pamela Booker is a 26 y.o. female who is on amox for reported bilateral OM. On day #7. Ears still hurt. No drainage or bleeding. Afebrile. With muffled hearing. ? ?Social History  ? ?Tobacco Use  ?Smoking Status Never  ?Smokeless Tobacco Never  ? ? ? ? ?OBJECTIVE: ? ?Vitals:  ? 10/05/21 1902  ?BP: 109/69  ?Pulse: 76  ?Resp: 16  ?Temp: 98.6 ?F (37 ?C)  ?TempSrc: Oral  ?SpO2: 98%  ?  ? ?General appearance: alert; appears fatigued ?Ear Canal: both ear canals with moderate inflammation; no debris ?TM: bilateral: serous fluid noted ?Neck: supple without LAD ?Lungs: unlabored respirations, symmetrical air entry; cough: absent; no respiratory distress ?Skin: warm and dry ?Psychological: alert and cooperative; normal mood and affect ? ?Allergies  ?Allergen Reactions  ? Nsaids Other (See Comments)  ?  History of bleeding ulcers  ? Benadryl [Diphenhydramine] Other (See Comments)  ?  "Crawling feeling under skin"  ? Compazine [Prochlorperazine]   ? Ibuprofen   ?  Other reaction(s): GI Upset (intolerance)  ? Emgality [Galcanezumab-Gnlm]  Rash  ? ? ?Past Medical History:  ?Diagnosis Date  ? Anxiety   ? Encounter for loop recorder check 06/10/2019  ? Epilepsy ([facility])   ? Hypertension   ? Loop: Bi[employer] loop recorder in situ 05/06/2019  ? Scheduled Remote loop recorder check 06/09/2019: This represents an unremarkable monitoring session. The presenting rhythm is not available. No symptomatic or automatic episodes were triggered. Daily activity trends are stable. Heart rate variability trends are stable.  ? Migraines   ? Palpitations   ? PVC (premature ventricular contraction)   ? PVC's (premature ventricular contractions)   ? Seizures (HCC)   ? Syncope and collapse 06/10/2019  ? ?Family History  ?Adopted: Yes  ?Problem Relation Age of Onset  ? Healthy Daughter   ? ?Social History  ? ?Socioeconomic History  ? Marital status: Divorced  ?  Spouse name: Not on file  ? Number of children: 1  ? Years of education: Not on file  ? Highest education level: Some college, no degree  ?Occupational History  ? Not on file  ?Tobacco Use  ? Smoking status: Never  ? Smokeless tobacco: Never  ?Vaping Use  ? Vaping Use: Never used  ?Substance and Sexual Activity  ? Alcohol use: Yes  ?  Comment: socially  ? Drug use: No  ? Sexual activity: Yes  ?Other Topics Concern  ? Not on file  ?Social History Narrative  ? Pt lives alone with daughter in 2 story home  ?  Right handed  ? Pt drinks coffee everyday, sometime tea at night, soda-coke zero not often  ? exercise 3-4 times a week  ?   ?    ? ?Social Determinants of Health  ? ?Financial Resource Strain: Not on file  ?Food Insecurity: Not on file  ?Transportation Needs: Not on file  ?Physical Activity: Not on file  ?Stress: Not on file  ?Social Connections: Not on file  ?Intimate Partner Violence: Not on file  ? ? ? ? ? ? ? ? ? ?  ?Mardella Layman, MD ?10/08/21 [phone] ? ?

## 2021-11-04 ENCOUNTER — Other Ambulatory Visit: Payer: Self-pay

## 2021-11-04 ENCOUNTER — Emergency Department
Admission: EM | Admit: 2021-11-04 | Discharge: 2021-11-04 | Disposition: A | Discharge status: Home / Self Care | Payer: BC Managed Care – PPO | Attending: Emergency Medicine | Admitting: Emergency Medicine

## 2021-11-04 ENCOUNTER — Encounter: Payer: Self-pay | Admitting: Emergency Medicine

## 2021-11-04 DIAGNOSIS — F10129 Alcohol abuse with intoxication, unspecified: Secondary | ICD-10-CM | POA: Insufficient documentation

## 2021-11-04 DIAGNOSIS — R258 Other abnormal involuntary movements: Secondary | ICD-10-CM | POA: Diagnosis not present

## 2021-11-04 DIAGNOSIS — R7309 Other abnormal glucose: Secondary | ICD-10-CM | POA: Insufficient documentation

## 2021-11-04 DIAGNOSIS — G40802 Other epilepsy, not intractable, without status epilepticus: Secondary | ICD-10-CM | POA: Diagnosis not present

## 2021-11-04 DIAGNOSIS — E872 Acidosis, unspecified: Secondary | ICD-10-CM | POA: Diagnosis not present

## 2021-11-04 DIAGNOSIS — R825 Elevated urine levels of drugs, medicaments and biological substances: Secondary | ICD-10-CM | POA: Insufficient documentation

## 2021-11-04 DIAGNOSIS — T7421XA Adult sexual abuse, confirmed, initial encounter: Secondary | ICD-10-CM | POA: Diagnosis not present

## 2021-11-04 DIAGNOSIS — R569 Unspecified convulsions: Secondary | ICD-10-CM | POA: Diagnosis not present

## 2021-11-04 DIAGNOSIS — R4020 Unspecified coma: Secondary | ICD-10-CM | POA: Diagnosis not present

## 2021-11-04 DIAGNOSIS — Y907 Blood alcohol level of 200-239 mg/100 ml: Secondary | ICD-10-CM | POA: Diagnosis not present

## 2021-11-04 DIAGNOSIS — R000 Tachycardia, unspecified: Secondary | ICD-10-CM | POA: Diagnosis not present

## 2021-11-04 DIAGNOSIS — F10929 Alcohol use, unspecified with intoxication, unspecified: Secondary | ICD-10-CM

## 2021-11-04 LAB — URINE DRUG SCREEN, QUALITATIVE (ARMC ONLY)
Amphetamines, Ur Screen: NOT DETECTED
Barbiturates, Ur Screen: NOT DETECTED
Benzodiazepine, Ur Scrn: POSITIVE — AB
Cannabinoid 50 Ng, Ur ~~LOC~~: NOT DETECTED
Cocaine Metabolite,Ur ~~LOC~~: NOT DETECTED
MDMA (Ecstasy)Ur Screen: NOT DETECTED
Methadone Scn, Ur: NOT DETECTED
Opiate, Ur Screen: NOT DETECTED
Phencyclidine (PCP) Ur S: NOT DETECTED
Tricyclic, Ur Screen: NOT DETECTED

## 2021-11-04 LAB — CBC WITH DIFFERENTIAL/PLATELET
Abs Immature Granulocytes: 0.02 10*3/uL (ref 0.00–0.07)
Basophils Absolute: 0 10*3/uL (ref 0.0–0.1)
Basophils Relative: 1 %
Eosinophils Absolute: 0.1 10*3/uL (ref 0.0–0.5)
Eosinophils Relative: 1 %
HCT: 45 % (ref 36.0–46.0)
Hemoglobin: 14.7 g/dL (ref 12.0–15.0)
Immature Granulocytes: 0 %
Lymphocytes Relative: 48 %
Lymphs Abs: 3.6 10*3/uL (ref 0.7–4.0)
MCH: 32.5 pg (ref 26.0–34.0)
MCHC: 32.7 g/dL (ref 30.0–36.0)
MCV: 99.3 fL (ref 80.0–100.0)
Monocytes Absolute: 0.6 10*3/uL (ref 0.1–1.0)
Monocytes Relative: 8 %
Neutro Abs: 3.1 10*3/uL (ref 1.7–7.7)
Neutrophils Relative %: 42 %
Platelets: 288 10*3/uL (ref 150–400)
RBC: 4.53 MIL/uL (ref 3.87–5.11)
RDW: 12.4 % (ref 11.5–15.5)
WBC: 7.4 10*3/uL (ref 4.0–10.5)
nRBC: 0 % (ref 0.0–0.2)

## 2021-11-04 LAB — COMPREHENSIVE METABOLIC PANEL
ALT: 36 U/L (ref 0–44)
AST: 16 U/L (ref 15–41)
Albumin: 4.3 g/dL (ref 3.5–5.0)
Alkaline Phosphatase: 80 U/L (ref 38–126)
Anion gap: 3 — ABNORMAL LOW (ref 5–15)
BUN: 9 mg/dL (ref 6–20)
CO2: 26 mmol/L (ref 22–32)
Calcium: 9.2 mg/dL (ref 8.9–10.3)
Chloride: 114 mmol/L — ABNORMAL HIGH (ref 98–111)
Creatinine, Ser: 0.51 mg/dL (ref 0.44–1.00)
GFR, Estimated: >60 mL/min (ref >60)
Glucose, Bld: 96 mg/dL (ref 70–99)
Potassium: 3.8 mmol/L (ref 3.5–5.1)
Sodium: 143 mmol/L (ref 135–145)
Total Bilirubin: 0.5 mg/dL (ref 0.3–1.2)
Total Protein: 7.2 g/dL (ref 6.5–8.1)

## 2021-11-04 LAB — URINALYSIS, ROUTINE W REFLEX MICROSCOPIC
Bilirubin Urine: NEGATIVE
Glucose, UA: 50 mg/dL — AB
Ketones, ur: NEGATIVE mg/dL
Leukocytes,Ua: NEGATIVE
Nitrite: NEGATIVE
Protein, ur: NEGATIVE mg/dL
Specific Gravity, Urine: 1.002 — ABNORMAL LOW (ref 1.005–1.030)
Squamous Epithelial / HPF: NONE SEEN (ref 0–5)
pH: 7 (ref 5.0–8.0)

## 2021-11-04 LAB — CBG MONITORING, ED: Glucose-Capillary: 103 mg/dL — ABNORMAL HIGH (ref 70–99)

## 2021-11-04 LAB — LACTIC ACID, PLASMA
Lactic Acid, Venous: 2 mmol/L (ref 0.5–1.9)
Lactic Acid, Venous: 2.1 mmol/L (ref 0.5–1.9)
Lactic Acid, Venous: 2.3 mmol/L (ref 0.5–1.9)

## 2021-11-04 LAB — PREGNANCY, URINE: Preg Test, Ur: NEGATIVE

## 2021-11-04 LAB — ETHANOL: Alcohol, Ethyl (B): 217 mg/dL — ABNORMAL HIGH (ref <10)

## 2021-11-04 MED ORDER — LACTATED RINGERS IV BOLUS
1000.0000 mL | Freq: Once | INTRAVENOUS | Status: AC
  Administered 2021-11-04: 1000 mL via INTRAVENOUS

## 2021-11-04 MED ORDER — LEVETIRACETAM IN NACL 1500 MG/100ML IV SOLN
1500.0000 mg | Freq: Once | INTRAVENOUS | Status: AC
  Administered 2021-11-04: 1500 mg via INTRAVENOUS
  Filled 2021-11-04: qty 100

## 2021-11-04 NOTE — ED Provider Notes (Signed)
[facility] Provider Note    Event Date/Time   First MD Initiated Contact with Patient 11/04/21 0103     (approximate)   History   Seizures and Alcohol Intoxication   HPI Level 5 caveat:  history/ROS limited by acute/critical illness  Pamela EdwardsBreanna Grace Booker is a 26 y.o. female who reportedly has a history of both epileptic and nonepileptic seizure-like episodes.  She presents by EMS tonight after seizure-like activity.  Reportedly multiple seizure-like events were witnessed by the patient's boyfriend at home.  The patient reported to EMS that she has been drinking alcohol tonight.  Of note, after they got her on the ambulance, she also reported that she had "stepped out" tonight and been with someone else.  Paramedics report that they gave her Versed 5 mg intramuscular when they first arrived.  They report the patient became awake and alert again during transport but then started having an additional seizure-like episode so they gave her an additional Versed 2.5 mg IV.  The patient is now awake but slurring her speech, talking to us but incoherently.  She seems to be reporting that she needs to urinate but is not reporting any pain.  She reports no shortness of breath.     Physical Exam   ED Triage Vitals  Enc Vitals Group     BP 11/04/21 0108 115/66     Pulse Rate 11/04/21 0108 87     Resp 11/04/21 0108 12     Temp --      Temp src --      SpO2 11/04/21 0108 100 %     Weight 11/04/21 0109 63.5 kg (140 lb)     Height 11/04/21 0109 1.575 m (5\' 2" )     Head Circumference --      Peak Flow --      Pain Score 11/04/21 0109 0     Pain Loc --      Pain Edu? --      Excl. in GC? --       Most recent vital signs: Vitals:   11/04/21 0500 11/04/21 0533  BP: (!) 81/45 97/62  Pulse: 72 93  Resp: 16 15  SpO2: 100% 100%     General: Awake but somnolent, slurred speech consistent with both benzodiazepine administration and alcohol use. CV:  Good  peripheral perfusion.  Resp:  Normal effort.  Lungs clear, no accessory muscle usage. Abd:  No distention.  No tenderness to palpation. Other:  Moving all 4 extremities.  Slurred speech.  No obvious evidence of trauma.   ED Results / Procedures / Treatments   Labs (all labs ordered are listed, but only abnormal results are displayed) Labs Reviewed  URINALYSIS, ROUTINE W REFLEX MICROSCOPIC - Abnormal; Notable for the following components:      Result Value   Color, Urine COLORLESS (*)    APPearance CLEAR (*)    Specific Gravity, Urine 1.002 (*)    Glucose, UA 50 (*)    Hgb urine dipstick SMALL (*)    Bacteria, UA RARE (*)    All other components within normal limits  URINE DRUG SCREEN, QUALITATIVE ([facility] ONLY) - Abnormal; Notable for the following components:   Benzodiazepine, Ur Scrn POSITIVE (*)    All other components within normal limits  ETHANOL - Abnormal; Notable for the following components:   Alcohol, Ethyl (B) 217 (*)    All other components within normal limits  COMPREHENSIVE METABOLIC PANEL - Abnormal; Notable for the  following components:   Chloride 114 (*)    Anion gap 3 (*)    All other components within normal limits  LACTIC ACID, PLASMA - Abnormal; Notable for the following components:   Lactic Acid, Venous 2.0 (*)    All other components within normal limits  LACTIC ACID, PLASMA - Abnormal; Notable for the following components:   Lactic Acid, Venous 2.1 (*)    All other components within normal limits  LACTIC ACID, PLASMA - Abnormal; Notable for the following components:   Lactic Acid, Venous 2.3 (*)    All other components within normal limits  CBG MONITORING, ED - Abnormal; Notable for the following components:   Glucose-Capillary 103 (*)    All other components within normal limits  CBC WITH DIFFERENTIAL/PLATELET  PREGNANCY, URINE  POC URINE PREG, ED     EKG  ED ECG REPORT I, Loleta Rose, the attending physician, personally viewed and interpreted  this ECG.  Date: 11/04/2021 EKG Time: 1:08 AM Rate: 80 Rhythm: normal sinus rhythm QRS Axis: normal Intervals: normal ST/T Wave abnormalities: normal Narrative Interpretation: no evidence of acute ischemia    PROCEDURES:  Critical Care performed: No  .1-3 Lead EKG Interpretation Performed by: Loleta Rose, MD Authorized by: Loleta Rose, MD     Interpretation: normal     ECG rate:  80   ECG rate assessment: normal     Rhythm: sinus rhythm     Ectopy: none     Conduction: normal     MEDICATIONS ORDERED IN ED: Medications  lactated ringers bolus 1,000 mL (0 mLs Intravenous Stopped 11/04/21 0241)  levETIRAcetam (KEPPRA) IVPB 1500 mg/ 100 mL premix (0 mg Intravenous Stopped 11/04/21 0143)  lactated ringers bolus 1,000 mL (0 mLs Intravenous Stopped 11/04/21 0507)     IMPRESSION / MDM / ASSESSMENT AND PLAN / ED COURSE  I reviewed the triage vital signs and the nursing notes.                              Differential diagnosis includes, but is not limited to, nonepileptic seizure, epileptic seizure, nonadherence to medication regimen, alcohol intoxication, other substance abuse, electrolyte or metabolic abnormality, less likely trauma or intracranial hemorrhage.  Patient's presentation is most consistent with acute presentation with potential threat to life or bodily function.  This is the patient's eighth visit to emergency departments in the last 6 months and she has 1 prior admission.  I read her note from an ED visit on 09/15/2021 at another emergency department, for example, where they mention that she had recently been admitted for long-term monitoring but continuous EEG showed no sign of seizure activity.  However it is also documented in the system that she has had "epileptic seizures" in the past and she is supposed to be taking Keppra but she is inconsistent with taking the medication.  The patient is on the cardiac monitor to evaluate for evidence of arrhythmia and/or  significant heart rate changes.  I also ordered seizure precautions.  Strongly suspect substantial alcohol intoxication tonight, by the patient's own report, and she has also received a total of Versed 7.5 mg prior to arrival, administered by EMS.  I ordered Keppra 1.5 g IV bolus and intended to be cautious if she is having epileptiform activity.  I also ordered LR 1 L IV bolus that she is mildly tachycardic.  Vital signs otherwise stable.  I ordered the following labs: Urinalysis, urine pregnancy  test, CBG, urine drug screen, comprehensive metabolic panel, lactic acid, CBC with differential, ethanol level. Clinical Course as of 11/04/21 0544  Fri Nov 04, 2021  0124 Glucose-Capillary(!): 103 [CF]  0149 Interpreted Labs: CBC normal, normal CBG, urinalysis essentially normal, no obvious sign of infection. [CF]  0157 Comprehensive metabolic panel(!) Interpreted CMP: Essentially normal, no concerning abnormalities. [CF]  0201 Alcohol, Ethyl (B)(!): 217 Substantially elevated ethanol level at 217 [CF]  0205 Lactic Acid, Venous(!!): 2.0 Slightly elevated lactic acid 2.0.  This is not consistent with sepsis.  Patient is already receiving IV fluids and I anticipate the repeat will be normal. [CF]  0205 Urine Drug Screen, Qualitative ([facility] only)(!) Urine drug screen is positive for benzodiazepines and she received Versed in the prehospital setting by paramedics [CF]  0243 Preg Test, Ur: NEGATIVE [CF]  0350 Lactic Acid, Venous(!!): 2.1 Repeat lactic acid after 1 L LR went up slightly at 2.1.  Again, not consistent with sepsis, but could be representative of the seizure-like activity earlier.  Ordering a second liter LR bolus and a repeat lactic acid after the bolus completion. [CF]  0542 Lactic acid came up to 2.3.  Somewhat inexplicable but not indicative of sepsis.  Patient is more awake and alert.  She is still sleepy but is oriented and cognizant of what is going on.  She admits to "partying" last  night and that she has been under a lot of stress.  She and her boyfriend, who is at bedside, also confirmed that stress seems to bring out her seizure-like episodes.  She says she feels fine and is ready to go home and the boyfriend is very comfortable taking her home right now.  No indication to keep her further.  She says she has an established neurologist and will follow-up with the next available opportunity.  I counseled against additional alcohol use and I gave my usual and customary return precautions. [CF]    Clinical Course User Index [CF] Loleta Rose, MD     FINAL CLINICAL IMPRESSION(S) / ED DIAGNOSES   Final diagnoses:  Seizure-like activity (HCC)  Alcoholic intoxication with complication (HCC)     Rx / DC Orders   ED Discharge Orders     None        Note:  This document was prepared using Dragon voice recognition software and may include unintentional dictation errors.   Loleta Rose, MD 11/04/21 [phone]

## 2021-11-04 NOTE — ED Triage Notes (Signed)
  Patient BIB EMS for seizures and alcohol intoxication.  Patient has hx of epilepsy and seizures.  Has been heavily drinking tonight and had multiple seizures.  Patient given 5 mg IM versed en route and 2.5 mg IV on arrival for seizure.  Patient tearful and citing problems with significant other.

## 2021-11-04 NOTE — Discharge Instructions (Signed)
You have been seen in the emergency department today for seizure-like activity and alcohol intoxication.  Your workup today is generally reassuring, but you need to drink plenty of non-alcoholic fluids to rehydrate yourself.  Please follow up with your doctor/neurologist as soon as possible regarding today's emergency department visit and your likely seizure.  As we have discussed it is very important that you do not drive until you have been seen and cleared by your neurologist.  Please drink plenty of fluids, get plenty of sleep and avoid any alcohol or drug use Please return to the emergency department if you have any further seizures which do not respond to medications, or for any other symptoms per se concerning for yourself.

## 2021-11-24 DIAGNOSIS — G43909 Migraine, unspecified, not intractable, without status migrainosus: Secondary | ICD-10-CM | POA: Diagnosis not present

## 2021-11-24 DIAGNOSIS — R413 Other amnesia: Secondary | ICD-10-CM | POA: Diagnosis not present

## 2021-11-24 DIAGNOSIS — Z79899 Other long term (current) drug therapy: Secondary | ICD-10-CM | POA: Diagnosis not present

## 2021-11-24 DIAGNOSIS — G40209 Localization-related (focal) (partial) symptomatic epilepsy and epileptic syndromes with complex partial seizures, not intractable, without status epilepticus: Secondary | ICD-10-CM | POA: Diagnosis not present

## 2021-11-28 ENCOUNTER — Emergency Department
Admission: EM | Admit: 2021-11-28 | Discharge: 2021-11-28 | Disposition: A | Discharge status: Home / Self Care | Payer: BC Managed Care – PPO | Attending: Emergency Medicine | Admitting: Emergency Medicine

## 2021-11-28 ENCOUNTER — Other Ambulatory Visit: Payer: Self-pay

## 2021-11-28 ENCOUNTER — Emergency Department: Payer: BC Managed Care – PPO

## 2021-11-28 ENCOUNTER — Encounter: Payer: Self-pay | Admitting: Emergency Medicine

## 2021-11-28 DIAGNOSIS — R079 Chest pain, unspecified: Secondary | ICD-10-CM | POA: Diagnosis not present

## 2021-11-28 DIAGNOSIS — I10 Essential (primary) hypertension: Secondary | ICD-10-CM | POA: Diagnosis not present

## 2021-11-28 DIAGNOSIS — Z462 Encounter for fitting and adjustment of other devices related to nervous system and special senses: Secondary | ICD-10-CM | POA: Diagnosis not present

## 2021-11-28 DIAGNOSIS — T859XXA Unspecified complication of internal prosthetic device, implant and graft, initial encounter: Secondary | ICD-10-CM

## 2021-11-28 DIAGNOSIS — T85199A Other mechanical complication of other implanted electronic stimulator of nervous system, initial encounter: Secondary | ICD-10-CM | POA: Diagnosis not present

## 2021-11-28 DIAGNOSIS — R55 Syncope and collapse: Secondary | ICD-10-CM | POA: Diagnosis not present

## 2021-11-28 MED ORDER — OXYCODONE HCL 5 MG PO TABS: 5.0000 mg | ORAL_TABLET | Freq: Three times a day (TID) | ORAL | 0 refills | Status: AC | PRN

## 2021-11-28 MED ORDER — OXYCODONE-ACETAMINOPHEN 5-325 MG PO TABS
1.0000 | ORAL_TABLET | Freq: Once | ORAL | Status: AC
  Administered 2021-11-28: 1 via ORAL
  Filled 2021-11-28: qty 1

## 2021-12-07 DIAGNOSIS — Z9689 Presence of other specified functional implants: Secondary | ICD-10-CM | POA: Diagnosis not present

## 2021-12-10 ENCOUNTER — Other Ambulatory Visit: Payer: Self-pay

## 2021-12-10 ENCOUNTER — Emergency Department
Admission: EM | Admit: 2021-12-10 | Discharge: 2021-12-10 | Disposition: A | Discharge status: Home / Self Care | Payer: BC Managed Care – PPO | Attending: Emergency Medicine | Admitting: Emergency Medicine

## 2021-12-10 DIAGNOSIS — R4020 Unspecified coma: Secondary | ICD-10-CM | POA: Diagnosis not present

## 2021-12-10 DIAGNOSIS — Z79899 Other long term (current) drug therapy: Secondary | ICD-10-CM | POA: Insufficient documentation

## 2021-12-10 DIAGNOSIS — F1010 Alcohol abuse, uncomplicated: Secondary | ICD-10-CM | POA: Insufficient documentation

## 2021-12-10 DIAGNOSIS — Y904 Blood alcohol level of 80-99 mg/100 ml: Secondary | ICD-10-CM | POA: Diagnosis not present

## 2021-12-10 DIAGNOSIS — R258 Other abnormal involuntary movements: Secondary | ICD-10-CM | POA: Diagnosis not present

## 2021-12-10 DIAGNOSIS — R9431 Abnormal electrocardiogram [ECG] [EKG]: Secondary | ICD-10-CM | POA: Diagnosis not present

## 2021-12-10 DIAGNOSIS — I10 Essential (primary) hypertension: Secondary | ICD-10-CM | POA: Diagnosis not present

## 2021-12-10 DIAGNOSIS — R569 Unspecified convulsions: Secondary | ICD-10-CM | POA: Diagnosis not present

## 2021-12-10 DIAGNOSIS — R0689 Other abnormalities of breathing: Secondary | ICD-10-CM | POA: Diagnosis not present

## 2021-12-10 LAB — CBC WITH DIFFERENTIAL/PLATELET
Abs Immature Granulocytes: 0.01 10*3/uL (ref 0.00–0.07)
Basophils Absolute: 0.1 10*3/uL (ref 0.0–0.1)
Basophils Relative: 1 %
Eosinophils Absolute: 0.1 10*3/uL (ref 0.0–0.5)
Eosinophils Relative: 1 %
HCT: 43 % (ref 36.0–46.0)
Hemoglobin: 14.4 g/dL (ref 12.0–15.0)
Immature Granulocytes: 0 %
Lymphocytes Relative: 41 %
Lymphs Abs: 2.6 10*3/uL (ref 0.7–4.0)
MCH: 33.1 pg (ref 26.0–34.0)
MCHC: 33.5 g/dL (ref 30.0–36.0)
MCV: 98.9 fL (ref 80.0–100.0)
Monocytes Absolute: 0.5 10*3/uL (ref 0.1–1.0)
Monocytes Relative: 8 %
Neutro Abs: 3.2 10*3/uL (ref 1.7–7.7)
Neutrophils Relative %: 49 %
Platelets: 256 10*3/uL (ref 150–400)
RBC: 4.35 MIL/uL (ref 3.87–5.11)
RDW: 11.9 % (ref 11.5–15.5)
WBC: 6.5 10*3/uL (ref 4.0–10.5)
nRBC: 0 % (ref 0.0–0.2)

## 2021-12-10 LAB — ETHANOL: Alcohol, Ethyl (B): 61 mg/dL — ABNORMAL HIGH (ref <10)

## 2021-12-10 LAB — COMPREHENSIVE METABOLIC PANEL
ALT: 34 U/L (ref 0–44)
AST: 25 U/L (ref 15–41)
Albumin: 4.5 g/dL (ref 3.5–5.0)
Alkaline Phosphatase: 84 U/L (ref 38–126)
Anion gap: 7 (ref 5–15)
BUN: 14 mg/dL (ref 6–20)
CO2: 25 mmol/L (ref 22–32)
Calcium: 9.2 mg/dL (ref 8.9–10.3)
Chloride: 108 mmol/L (ref 98–111)
Creatinine, Ser: 0.75 mg/dL (ref 0.44–1.00)
GFR, Estimated: >60 mL/min (ref >60)
Glucose, Bld: 95 mg/dL (ref 70–99)
Potassium: 3.5 mmol/L (ref 3.5–5.1)
Sodium: 140 mmol/L (ref 135–145)
Total Bilirubin: 0.5 mg/dL (ref 0.3–1.2)
Total Protein: 7.3 g/dL (ref 6.5–8.1)

## 2021-12-10 MED ORDER — SODIUM CHLORIDE 0.9 % IV BOLUS (SEPSIS)
1000.0000 mL | Freq: Once | INTRAVENOUS | Status: AC
  Administered 2021-12-10: 1000 mL via INTRAVENOUS

## 2021-12-10 MED ORDER — LORAZEPAM 2 MG/ML IJ SOLN
1.0000 mg | Freq: Once | INTRAMUSCULAR | Status: AC
  Administered 2021-12-10: 1 mg via INTRAVENOUS
  Filled 2021-12-10: qty 1

## 2021-12-10 MED ORDER — SODIUM CHLORIDE 0.9 % IV SOLN: INTRAVENOUS | Status: DC

## 2021-12-10 NOTE — ED Triage Notes (Signed)
Pt comes from home via [facility] c/o seizures. Per EMS, pt was shaking in bathroom for 13-15 mins prior to EMS arrival. Pt was unresponsive in bathroom. EMS states pt was seizing for about 30 mins total.  Pt has vagal nerve stimulator. Pt responding to voice at this time and following commands.

## 2021-12-10 NOTE — ED Provider Notes (Signed)
[facility] Provider Note    Event Date/Time   First MD Initiated Contact with Patient 12/10/21 0141     (approximate)   History   Seizures   HPI  Pamela Booker is a 26 y.o. 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  Boyfriend and EMS provide most of the history.  Boyfriend reports that patient started having what appeared to be a nonepileptic seizure tonight.  EMS reports shaking activity occurred and lasted for about 30 minutes.  No postictal state afterwards.  Medications were not given in route.  Normal blood glucose with EMS.  Boyfriend reports that the patient has been under a lot of stress recently as she is supposed to have surgery for her vagal nerve stimulator in [place] this week.  No known missed medications.  No fevers or recent infectious symptoms.  Boyfriend reports symptoms started tonight while they were watching a movie.  He reports that this is classic of her nonepileptic seizures.  He states she has seizures about once a month.  History provided by boyfriend and EMS and then eventually patient.    Past Medical History:  Diagnosis Date   Anxiety    Encounter for loop recorder check 06/10/2019   Epilepsy (HCC)    Hypertension    Loop: Bi[employer] loop recorder in situ 05/06/2019   Scheduled Remote loop recorder check 06/09/2019: This represents an unremarkable monitoring session. The presenting rhythm is not available. No symptomatic or automatic episodes were triggered. Daily activity trends are stable. Heart rate variability trends are stable.   Migraines    Palpitations    PVC (premature ventricular contraction)    PVC's (premature ventricular contractions)    Seizures (HCC)    Syncope and collapse 06/10/2019    Past Surgical History:  Procedure Laterality Date   LAPAROSCOPY     TONSILLECTOMY     VAGUS NERVE  STIMULATOR INSERTION      MEDICATIONS:  Prior to Admission medications   Medication Sig Start Date End Date Taking? Authorizing Provider  acebutolol (SECTRAL) 200 MG capsule TAKE ONE CAPSULE BY MOUTH TWICE A DAY DISCONTINUE VERAPAMIL Patient taking differently: Take 200 mg by mouth 2 (two) times daily. TAKE ONE CAPSULE BY MOUTH TWICE A DAY DISCONTINUE VERAPAMIL 06/02/21   Cantwell, Celeste C, PA-C  buPROPion (WELLBUTRIN XL) 300 MG 24 hr tablet Take 300 mg by mouth every morning. 11/03/20   [provider]  butalbital-acetaminophen-caffeine (FIORICET) 50-325-40 MG tablet Take 1-2 tablets by mouth every 6 (six) hours as needed for headache. Patient not taking: Reported on 11/28/2021 12/04/20   Gilda Crease, MD  ciprofloxacin-dexamethasone (CIPRODEX) OTIC suspension Place 4 drops into both ears 2 (two) times daily. 10/05/21   Mardella Layman, MD  diazepam (VALIUM) 5 MG tablet Take 5 mg by mouth 2 (two) times daily as needed for muscle spasms. 05/31/21   [provider]  escitalopram (LEXAPRO) 20 MG tablet Take 20 mg by mouth daily.  08/23/18   [provider]  estradiol (VIVELLE-DOT) 0.075 MG/24HR Place 1 patch onto the skin as needed (period bleeding). 03/02/21   [provider]  fluticasone (FLONASE) 50 MCG/ACT nasal spray Place 2 sprays into both nostrils daily. 09/02/21   Domenick Gong, MD  HYDROcodone-acetaminophen (NORCO/VICODIN) 5-325 MG tablet Take 1 to 2 tablets by mouth every 6 (six) hours as needed. 08/01/21   Haskel Schroeder, PA-C  lamoTRIgine (LAMICTAL)  25 MG tablet PLEASE SEE ATTACHED FOR DETAILED DIRECTIONS 09/22/21   [provider]  levETIRAcetam (KEPPRA) 500 MG tablet Take 1 tablet (500 mg total) by mouth 2 (two) times daily. 05/06/20   Gerhard Munch, MD  NAYZILAM 5 MG/0.1ML SOLN Place into both nostrils daily. 08/19/21   [provider]  norethindrone (AYGESTIN) 5 MG tablet Take 5 mg by mouth daily. 02/26/19 07/05/21   [provider]  oxyCODONE (ROXICODONE) 5 MG immediate release tablet Take 1 tablet (5 mg total) by mouth every 8 (eight) hours as needed for up to 6 doses for breakthrough pain. 09/15/21   Curatolo, Adam, DO  predniSONE (DELTASONE) 20 MG tablet Take 2 tablets (40 mg total) by mouth daily. 10/05/21   Mardella Layman, MD  PRESCRIPTION MEDICATION Apply 1 application topically every other day. Apply to face at night - Hers 20 ml Acne ( pump) Az/Zp (0.0125%)    [provider]  rizatriptan (MAXALT-MLT) 10 MG disintegrating tablet Take 10 mg by mouth daily as needed for migraine.  03/17/19   [provider]  sertraline (ZOLOFT) 100 MG tablet Take 100 mg by mouth daily. 09/13/21   [provider]  tiZANidine (ZANAFLEX) 4 MG tablet Take 4-8 mg by mouth as needed for muscle spasms. 06/08/21   [provider]  topiramate (TOPAMAX) 100 MG tablet Take 100 mg by mouth daily.    [provider]  valACYclovir (VALTREX) 500 MG tablet Take 500 mg by mouth daily.    [provider]  XCOPRI 14 x 150 MG & 14 x200 MG TBPK Take 200 mg by mouth at bedtime. 11/11/20   [provider]  omeprazole (PRILOSEC) 20 MG capsule Take 1 capsule (20 mg total) by mouth daily for 14 days. Patient not taking: Reported on 05/06/2020 11/03/19 10/02/20  Darr, Gerilyn Pilgrim, PA-C    Physical Exam   Triage Vital Signs: ED Triage Vitals  Enc Vitals Group     BP      Pulse      Resp      Temp      Temp src      SpO2      Weight      Height      Head Circumference      Peak Flow      Pain Score      Pain Loc      Pain Edu?      Excl. in GC?     Most recent vital signs: Vitals:   12/10/21 0330 12/10/21 0600  BP: (!) 94/57 99/68  Pulse: 86 71  Resp: 16 14  Temp:    SpO2: 97% 96%    CONSTITUTIONAL: Alert and has eyes open but is not answering questions.  She will follow commands. HEAD: Normocephalic, atraumatic EYES: Conjunctivae clear, pupils appear equal, sclera  nonicteric ENT: normal nose; moist mucous membranes NECK: Supple, normal ROM CARD: RRR; S1 and S2 appreciated; no murmurs, no clicks, no rubs, no gallops RESP: Normal chest excursion without splinting or tachypnea; breath sounds clear and equal bilaterally; no wheezes, no rhonchi, no rales, no hypoxia or respiratory distress, speaking full sentences ABD/GI: Normal bowel sounds; non-distended; soft, non-tender, no rebound, no guarding, no peritoneal signs BACK: The back appears normal EXT: Normal ROM in all joints; no deformity noted, no edema; no cyanosis SKIN: Normal color for age and race; warm; no rash on exposed skin NEURO: Moves all extremities equally, follows commands.  Opens eyes. PSYCH: The patient's  mood and manner are appropriate.   ED Results / Procedures / Treatments   LABS: (all labs ordered are listed, but only abnormal results are displayed) Labs Reviewed  ETHANOL - Abnormal; Notable for the following components:      Result Value   Alcohol, Ethyl (B) 61 (*)    All other components within normal limits  COMPREHENSIVE METABOLIC PANEL  CBC WITH DIFFERENTIAL/PLATELET  URINE DRUG SCREEN, QUALITATIVE ([facility] ONLY)  URINALYSIS, ROUTINE W REFLEX MICROSCOPIC  POC URINE PREG, ED     EKG:  EKG Interpretation  Date/Time:  Saturday December 10 2021 01:46:48 EDT Ventricular Rate:  99 PR Interval:  180 QRS Duration: 84 QT Interval:  337 QTC Calculation: 433 R Axis:   55 Text Interpretation: Sinus rhythm RSR' in V1 or V2, probably normal variant Confirmed by Rochele Raring [phone]) on 12/10/2021 2:16:15 AM         RADIOLOGY: My personal review and interpretation of imaging:    I have personally reviewed all radiology reports.   No results found.   PROCEDURES:  Critical Care performed: No      .1-3 Lead EKG Interpretation  Performed by: , Layla Maw, DO Authorized by: , Layla Maw, DO     Interpretation: normal     ECG rate:  71   ECG rate assessment:  normal     Rhythm: sinus rhythm     Ectopy: none     Conduction: normal       IMPRESSION / MDM / ASSESSMENT AND PLAN / ED COURSE  I reviewed the triage vital signs and the nursing notes.    Patient here for concerns for nonepileptic seizure.  Seizure-like activity has stopped on arrival to the ED.  She is following commands but will not answer questions for me but EMS reports she has been able to talk to them since the seizure-like activity stopped.  The patient is on the cardiac monitor to evaluate for evidence of arrhythmia and/or significant heart rate changes.   DIFFERENTIAL DIAGNOSIS (includes but not limited to):   Nonepileptic seizure.  Less likely epileptic seizure.  Low suspicion for status epilepticus.   Patient's presentation is most consistent with acute presentation with potential threat to life or bodily function.   PLAN: We will obtain CBC, CMP, ethanol level, urinalysis, urine drug screen.  We will monitor closely.   MEDICATIONS GIVEN IN ED: Medications  0.9 %  sodium chloride infusion (0 mLs Intravenous Stopped 12/10/21 0636)  sodium chloride 0.9 % bolus 1,000 mL (0 mLs Intravenous Stopped 12/10/21 0320)  LORazepam (ATIVAN) injection 1 mg (1 mg Intravenous Given 12/10/21 0215)     ED COURSE: Nurse reports patient had 1 brief episode of shaking her head but immediately came to on my arrival to the room and again did not appear postictal.  Again suspect nonepileptic seizure but given she does have true history of epileptic seizures and a vagal nerve stimulator will give a dose of Ativan here.  Again low suspicion for nonconvulsive status epilepticus.  She is following commands appropriately.   Patient's labs are unremarkable.  No leukocytosis.  Normal electrolytes and renal function.  Alcohol level of 61.  Patient has been able to eat and drink here and is now able to ambulate without difficulty.  She is able to talk and is alert and oriented x3.  I have asked her to  provide a urine sample but she states that her boyfriend just got her up to the bathroom and she  does not need to pee again and wants to leave and go home.  Explained my concerns of making sure that she was not pregnant and did not have a UTI that was contributing to her symptoms but she feels like it is all due to stress.  She agrees with plan to follow-up closely with her neurologist.  Again given I think that this was a nonepileptic seizure today, I do not feel that we need to adjust her seizure medications.  Not able to send XCopri level from the ED.   At this time, I do not feel there is any life-threatening condition present. I reviewed all nursing notes, vitals, pertinent previous records.  All lab and urine results, EKGs, imaging ordered have been independently reviewed and interpreted by myself.  I reviewed all available radiology reports from any imaging ordered this visit.  Based on my assessment, I feel the patient is safe to be discharged home without further emergent workup and can continue workup as an outpatient as needed. Discussed all findings, treatment plan as well as usual and customary return precautions.  They verbalize understanding and are comfortable with this plan.  Outpatient follow-up has been provided as needed.  All questions have been answered.    CONSULTS: No emergent neurologic consult needed.  Patient back to her neurologic baseline and requesting discharge.  Doubt status epilepticus.   OUTSIDE RECORDS REVIEWED: Reviewed previous notes from [facility].       FINAL CLINICAL IMPRESSION(S) / ED DIAGNOSES   Final diagnoses:  Seizure-like activity (HCC)     Rx / DC Orders   ED Discharge Orders     None        Note:  This document was prepared using Dragon voice recognition software and may include unintentional dictation errors.   , Layla Maw, DO 12/10/21 1029

## 2021-12-10 NOTE — Discharge Instructions (Addendum)
Please follow-up closely with your neurologist, neurosurgeon.  Please continue all of your medications as prescribed.

## 2021-12-12 DIAGNOSIS — Z79899 Other long term (current) drug therapy: Secondary | ICD-10-CM | POA: Diagnosis not present

## 2021-12-12 DIAGNOSIS — I10 Essential (primary) hypertension: Secondary | ICD-10-CM | POA: Diagnosis not present

## 2021-12-12 DIAGNOSIS — Y752 Prosthetic and other implants, materials and neurological devices associated with adverse incidents: Secondary | ICD-10-CM | POA: Diagnosis not present

## 2021-12-12 DIAGNOSIS — Z888 Allergy status to other drugs, medicaments and biological substances status: Secondary | ICD-10-CM | POA: Diagnosis not present

## 2021-12-12 DIAGNOSIS — Z9689 Presence of other specified functional implants: Secondary | ICD-10-CM | POA: Diagnosis not present

## 2021-12-12 DIAGNOSIS — Y838 Other surgical procedures as the cause of abnormal reaction of the patient, or of later complication, without mention of misadventure at the time of the procedure: Secondary | ICD-10-CM | POA: Diagnosis not present

## 2021-12-12 DIAGNOSIS — R002 Palpitations: Secondary | ICD-10-CM | POA: Diagnosis not present

## 2021-12-12 DIAGNOSIS — T85840A Pain due to nervous system prosthetic devices, implants and grafts, initial encounter: Secondary | ICD-10-CM | POA: Diagnosis not present

## 2021-12-12 DIAGNOSIS — T8189XA Other complications of procedures, not elsewhere classified, initial encounter: Secondary | ICD-10-CM | POA: Diagnosis not present

## 2021-12-12 DIAGNOSIS — G40919 Epilepsy, unspecified, intractable, without status epilepticus: Secondary | ICD-10-CM | POA: Diagnosis not present

## 2021-12-12 DIAGNOSIS — Z4542 Encounter for adjustment and management of neuropacemaker (brain) (peripheral nerve) (spinal cord): Secondary | ICD-10-CM | POA: Diagnosis not present

## 2021-12-12 DIAGNOSIS — T85193A Other mechanical complication of implanted electronic neurostimulator, generator, initial encounter: Secondary | ICD-10-CM | POA: Diagnosis not present

## 2021-12-12 DIAGNOSIS — Z886 Allergy status to analgesic agent status: Secondary | ICD-10-CM | POA: Diagnosis not present

## 2022-01-03 DIAGNOSIS — Z9689 Presence of other specified functional implants: Secondary | ICD-10-CM | POA: Diagnosis not present

## 2022-01-03 DIAGNOSIS — G40219 Localization-related (focal) (partial) symptomatic epilepsy and epileptic syndromes with complex partial seizures, intractable, without status epilepticus: Secondary | ICD-10-CM | POA: Diagnosis not present

## 2022-01-25 ENCOUNTER — Ambulatory Visit
Admission: EM | Admit: 2022-01-25 | Discharge: 2022-01-25 | Disposition: A | Discharge status: Home / Self Care | Payer: BC Managed Care – PPO | Attending: Emergency Medicine | Admitting: Emergency Medicine

## 2022-01-25 DIAGNOSIS — R079 Chest pain, unspecified: Secondary | ICD-10-CM | POA: Insufficient documentation

## 2022-01-25 DIAGNOSIS — I493 Ventricular premature depolarization: Secondary | ICD-10-CM | POA: Insufficient documentation

## 2022-01-25 DIAGNOSIS — R0602 Shortness of breath: Secondary | ICD-10-CM | POA: Insufficient documentation

## 2022-01-25 DIAGNOSIS — I10 Essential (primary) hypertension: Secondary | ICD-10-CM | POA: Diagnosis not present

## 2022-01-25 DIAGNOSIS — R0981 Nasal congestion: Secondary | ICD-10-CM | POA: Insufficient documentation

## 2022-01-25 DIAGNOSIS — R569 Unspecified convulsions: Secondary | ICD-10-CM | POA: Diagnosis not present

## 2022-01-25 DIAGNOSIS — J029 Acute pharyngitis, unspecified: Secondary | ICD-10-CM | POA: Insufficient documentation

## 2022-01-25 DIAGNOSIS — B349 Viral infection, unspecified: Secondary | ICD-10-CM | POA: Insufficient documentation

## 2022-01-25 DIAGNOSIS — G43909 Migraine, unspecified, not intractable, without status migrainosus: Secondary | ICD-10-CM | POA: Insufficient documentation

## 2022-01-25 DIAGNOSIS — Z20822 Contact with and (suspected) exposure to covid-19: Secondary | ICD-10-CM | POA: Insufficient documentation

## 2022-01-25 MED ORDER — PREDNISONE 20 MG PO TABS: 40.0000 mg | ORAL_TABLET | Freq: Every day | ORAL | 0 refills | Status: DC

## 2022-01-25 NOTE — ED Triage Notes (Signed)
Pt presents with c/o sore throat, cough  and has c/o chest pain with inspiration , began last night

## 2022-01-25 NOTE — Discharge Instructions (Addendum)
Your symptoms today are most likely being caused by a virus and should steadily improve in time it can take up to 7 to 10 days before you truly start to see a turnaround however things will get better  COVID test is pending, if positive you will need to quarantine 5 days from the onset of symptoms, you may return to normal activity on Sunday, January 29, 2022, if positive you will qualify for antiviral medicines to help reduce illness and ideally prevent hospitalization and medication will be sent in at time of notification  Starting tomorrow take prednisone every morning with food for the next 5 days to reduce inflammation and irritation to the airways which may be aiding to your shortness of breath  At any point if your chest pain worsens you will need to go to the nearest emergency department for further evaluation      You can take Tylenol and/or Ibuprofen as needed for fever reduction and pain relief.   For cough: honey 1/2 to 1 teaspoon (you can dilute the honey in water or another fluid).  You can also use guaifenesin and dextromethorphan for cough. You can use a humidifier for chest congestion and cough.  If you don't have a humidifier, you can sit in the bathroom with the hot shower running.      For sore throat: try warm salt water gargles, cepacol lozenges, throat spray, warm tea or water with lemon/honey, popsicles or ice, or OTC cold relief medicine for throat discomfort.   For congestion: take a daily anti-histamine like Zyrtec, Claritin, and a oral decongestant, such as pseudoephedrine.  You can also use Flonase 1-2 sprays in each nostril daily.   It is important to stay hydrated: drink plenty of fluids (water, gatorade/powerade/pedialyte, juices, or teas) to keep your throat moisturized and help further relieve irritation/discomfort.

## 2022-01-26 DIAGNOSIS — G43909 Migraine, unspecified, not intractable, without status migrainosus: Secondary | ICD-10-CM | POA: Diagnosis not present

## 2022-01-26 DIAGNOSIS — M545 Low back pain, unspecified: Secondary | ICD-10-CM | POA: Diagnosis not present

## 2022-01-26 DIAGNOSIS — G40209 Localization-related (focal) (partial) symptomatic epilepsy and epileptic syndromes with complex partial seizures, not intractable, without status epilepticus: Secondary | ICD-10-CM | POA: Diagnosis not present

## 2022-01-26 DIAGNOSIS — F5101 Primary insomnia: Secondary | ICD-10-CM | POA: Diagnosis not present

## 2022-01-26 LAB — SARS CORONAVIRUS 2 BY RT PCR: SARS Coronavirus 2 by RT PCR: NEGATIVE

## 2022-01-26 NOTE — ED Provider Notes (Signed)
Pamela Booker    CSN: [id number] Arrival date & time: 01/25/22  1809      History   Chief Complaint Chief Complaint  Patient presents with   Shortness of Breath   Chest Pain   Sore Throat    HPI Pamela Booker is a 26 y.o. female.   Patient presents with centralized chest pain, pain with deep breathing, sore throat, nasal congestion and a nonproductive cough beginning 1 day ago.  Throat has resolved.  Chest pain does not radiate, is constant, has not worsened but has persisted.  Denies chest injury or trauma.  Possible exposure to COVID.  Has not attempted treatment of symptoms.  Denies wheezing, fever, chills or body aches, ear pain.  History of seizures, epilepsy, syncope, migraines, hypertension, palpitations and PVCs.  Recent surgery for replacement of vagal nerve stimulator on 12/12/2021.  Past Medical History:  Diagnosis Date   Anxiety    Encounter for loop recorder check 06/10/2019   Epilepsy (HCC)    Hypertension    Loop: Biotronic loop recorder in situ 05/06/2019   Scheduled Remote loop recorder check 06/09/2019: This represents an unremarkable monitoring session. The presenting rhythm is not available. No symptomatic or automatic episodes were triggered. Daily activity trends are stable. Heart rate variability trends are stable.   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 Authorizing Provider  predniSONE (DELTASONE) 20 MG tablet Take 2 tablets (40 mg total) by mouth daily. 01/25/22  Yes ,  R, NP  acebutolol (SECTRAL) 200 MG capsule TAKE ONE CAPSULE BY MOUTH TWICE A DAY DISCONTINUE VERAPAMIL Patient taking differently: Take 200 mg by mouth 2 (two) times daily. TAKE ONE CAPSULE BY MOUTH TWICE A DAY DISCONTINUE VERAPAMIL 06/02/21   Cantwell, Celeste C, PA-C  buPROPion (WELLBUTRIN XL) 300 MG 24 hr tablet Take 300 mg by mouth every morning. 11/03/20   [provider]  butalbital-acetaminophen-caffeine (FIORICET) 50-325-40 MG tablet Take 1-2 tablets by mouth every 6 (six) hours as needed for headache. Patient not taking: Reported on 11/28/2021 12/04/20   Gilda Crease, MD  ciprofloxacin-dexamethasone (CIPRODEX) OTIC suspension Place 4 drops into both ears 2 (two) times daily. 10/05/21   Mardella Layman, MD  diazepam (VALIUM) 5 MG tablet Take 5 mg by mouth 2 (two)  times daily as needed for muscle spasms. 05/31/21   [provider]  escitalopram (LEXAPRO) 20 MG tablet Take 20 mg by mouth daily.  08/23/18   [provider]  estradiol (VIVELLE-DOT) 0.075 MG/24HR Place 1 patch onto the skin as needed (period bleeding). 03/02/21   [provider]  fluticasone (FLONASE) 50 MCG/ACT nasal spray Place 2 sprays into both nostrils daily. 09/02/21   Domenick Gong, MD  HYDROcodone-acetaminophen (NORCO/VICODIN) 5-325 MG tablet Take 1 to 2 tablets by mouth every 6 (six) hours as needed. 08/01/21   Haskel Schroeder, PA-C  lamoTRIgine (LAMICTAL) 25 MG tablet PLEASE SEE ATTACHED FOR DETAILED DIRECTIONS 09/22/21    [provider]  levETIRAcetam (KEPPRA) 500 MG tablet Take 1 tablet (500 mg total) by mouth 2 (two) times daily. 05/06/20   Gerhard Munch, MD  NAYZILAM 5 MG/0.1ML SOLN Place into both nostrils daily. 08/19/21   [provider]  norethindrone (AYGESTIN) 5 MG tablet Take 5 mg by mouth daily. 02/26/19 07/05/21  [provider]  oxyCODONE (ROXICODONE) 5 MG immediate release tablet Take 1 tablet (5 mg total) by mouth every 8 (eight) hours as needed for up to 6 doses for breakthrough pain. 09/15/21   Virgina Norfolk, DO  PRESCRIPTION MEDICATION Apply 1 application topically every other day. Apply to face at night - Hers 20 ml Acne ( pump) Az/Zp (0.0125%)    [provider]  rizatriptan (MAXALT-MLT) 10 MG disintegrating tablet Take 10 mg by mouth daily as needed for migraine.  03/17/19   [provider]  sertraline (ZOLOFT) 100 MG tablet Take 100 mg by mouth daily. 09/13/21   [provider]  tiZANidine (ZANAFLEX) 4 MG tablet Take 4-8 mg by mouth as needed for muscle spasms. 06/08/21   [provider]  topiramate (TOPAMAX) 100 MG tablet Take 100 mg by mouth daily.    [provider]  valACYclovir (VALTREX) 500 MG tablet Take 500 mg by mouth daily.    [provider]  XCOPRI 14 x 150 MG & 14 x200 MG TBPK Take 200 mg by mouth at bedtime. 11/11/20   [provider]  omeprazole (PRILOSEC) 20 MG capsule Take 1 capsule (20 mg total) by mouth daily for 14 days. Patient not taking: Reported on 05/06/2020 11/03/19 10/02/20  Darr, Gerilyn Pilgrim, PA-C    Family History Family History  Adopted: Yes  Problem Relation Age of Onset   Healthy Daughter     Social History Social History   Tobacco Use   Smoking status: Never   Smokeless tobacco: Never  Vaping Use   Vaping Use: Never used  Substance Use Topics   Alcohol use: Yes    Comment: socially   Drug use: No     Allergies   Nsaids, Benadryl [diphenhydramine], Compazine  [prochlorperazine], Ibuprofen, and Emgality [galcanezumab-gnlm]   Review of Systems Review of Systems Defer to HPI   Physical Exam Triage Vital Signs ED Triage Vitals  Enc Vitals Group     BP 01/25/22 1826 125/84     Pulse Rate 01/25/22 1826 86     Resp 01/25/22 1826 18     Temp 01/25/22 1826 98.1 F (36.7 C)     Temp src --      SpO2 01/25/22 1826 98 %     Weight --      Height --      Head Circumference --      Peak Flow --      Pain Score 01/25/22 1823 6  Pain Loc --      Pain Edu? --      Excl. in GC? --    No data found.  Updated Vital Signs BP 125/84   Pulse 86   Temp 98.1 F (36.7 C)   Resp 18   LMP 01/04/2022 (Approximate)   SpO2 98%   Visual Acuity Right Eye Distance:   Left Eye Distance:   Bilateral Distance:    Right Eye Near:   Left Eye Near:    Bilateral Near:     Physical Exam Constitutional:      Appearance: Normal appearance.  HENT:     Head: Normocephalic.     Right Ear: Tympanic membrane, ear canal and external ear normal.     Left Ear: Tympanic membrane, ear canal and external ear normal.     Nose: Congestion and rhinorrhea present.     Mouth/Throat:     Mouth: Mucous membranes are moist.     Pharynx: Oropharynx is clear.  Eyes:     Extraocular Movements: Extraocular movements intact.  Cardiovascular:     Rate and Rhythm: Normal rate and regular rhythm.     Pulses: Normal pulses.     Heart sounds: Normal heart sounds.  Pulmonary:     Effort: Pulmonary effort is normal.     Breath sounds: Normal breath sounds.  Chest:     Comments: Unable to produce tenderness along the chest wall, no ecchymosis, swelling or deformity, chest wall is symmetrical with surgical incision to the left chest wall, no signs of infection Neurological:     Mental Status: She is alert.      UC Treatments / Results  Labs (all labs ordered are listed, but only abnormal results are displayed) Labs Reviewed  SARS CORONAVIRUS 2 BY RT PCR     EKG   Radiology No results found.  Procedures Procedures (including critical care time)  Medications Ordered in UC Medications - No data to display  Initial Impression / Assessment and Plan / UC Course  I have reviewed the triage vital signs and the nursing notes.  Pertinent labs & imaging results that were available during my care of the patient were reviewed by me and considered in my medical decision making (see chart for details).  Viral illness  COVID test is pending, will qualify for antivirals if positive, discussed administration and quarantine guidelines per the CDC, vital signs are stable at this time and patient is in no signs of distress, declined EKG, prednisone course prescribed for treatment of shortness of breath and advised patient to begin use of her albuterol inhaler, may take over-the-counter medications as needed, given strict precautions that if chest pain worsens in intensity continues to persist she will go to go to the nearest emergency department for further evaluation and management Final Clinical Impressions(s) / UC Diagnoses   Final diagnoses:  Viral illness     Discharge Instructions      Your symptoms today are most likely being caused by a virus and should steadily improve in time it can take up to 7 to 10 days before you truly start to see a turnaround however things will get better  COVID test is pending, if positive you will need to quarantine 5 days from the onset of symptoms, you may return to normal activity on Sunday, January 29, 2022, if positive you will qualify for antiviral medicines to help reduce illness and ideally prevent hospitalization and medication will be sent in at time of notification  Starting tomorrow take prednisone every morning with food for the next 5 days to reduce inflammation and irritation to the airways which may be aiding to your shortness of breath  At any point if your chest pain worsens you will need to go  to the nearest emergency department for further evaluation      You can take Tylenol and/or Ibuprofen as needed for fever reduction and pain relief.   For cough: honey 1/2 to 1 teaspoon (you can dilute the honey in water or another fluid).  You can also use guaifenesin and dextromethorphan for cough. You can use a humidifier for chest congestion and cough.  If you don't have a humidifier, you can sit in the bathroom with the hot shower running.      For sore throat: try warm salt water gargles, cepacol lozenges, throat spray, warm tea or water with lemon/honey, popsicles or ice, or OTC cold relief medicine for throat discomfort.   For congestion: take a daily anti-histamine like Zyrtec, Claritin, and a oral decongestant, such as pseudoephedrine.  You can also use Flonase 1-2 sprays in each nostril daily.   It is important to stay hydrated: drink plenty of fluids (water, gatorade/powerade/pedialyte, juices, or teas) to keep your throat moisturized and help further relieve irritation/discomfort.    ED Prescriptions     Medication Sig Dispense Auth. Provider   predniSONE (DELTASONE) 20 MG tablet Take 2 tablets (40 mg total) by mouth daily. 10 tablet Valinda Hoar, NP      PDMP not reviewed this encounter.   Valinda Hoar, [place] 01/26/22 [phone]

## 2022-01-29 ENCOUNTER — Encounter (HOSPITAL_COMMUNITY): Payer: Self-pay

## 2022-01-29 ENCOUNTER — Inpatient Hospital Stay: Admit: 2022-01-29 | Payer: BC Managed Care – PPO

## 2022-01-29 ENCOUNTER — Emergency Department: Payer: BC Managed Care – PPO

## 2022-01-29 ENCOUNTER — Observation Stay (HOSPITAL_COMMUNITY)
Admission: EM | Admit: 2022-01-29 | Discharge: 2022-01-29 | Discharge status: Against Medical Advice | Payer: BC Managed Care – PPO | Attending: Internal Medicine | Admitting: Internal Medicine

## 2022-01-29 ENCOUNTER — Emergency Department
Admission: EM | Admit: 2022-01-29 | Discharge: 2022-01-29 | Disposition: A | Discharge status: Other Acute Inpatient Hospital | Payer: BC Managed Care – PPO | Attending: Emergency Medicine | Admitting: Emergency Medicine

## 2022-01-29 DIAGNOSIS — F10120 Alcohol abuse with intoxication, uncomplicated: Secondary | ICD-10-CM | POA: Insufficient documentation

## 2022-01-29 DIAGNOSIS — E876 Hypokalemia: Secondary | ICD-10-CM

## 2022-01-29 DIAGNOSIS — Y906 Blood alcohol level of 120-199 mg/100 ml: Secondary | ICD-10-CM | POA: Diagnosis not present

## 2022-01-29 DIAGNOSIS — G40909 Epilepsy, unspecified, not intractable, without status epilepticus: Principal | ICD-10-CM | POA: Insufficient documentation

## 2022-01-29 DIAGNOSIS — R569 Unspecified convulsions: Secondary | ICD-10-CM | POA: Diagnosis not present

## 2022-01-29 DIAGNOSIS — G40901 Epilepsy, unspecified, not intractable, with status epilepticus: Secondary | ICD-10-CM | POA: Diagnosis not present

## 2022-01-29 DIAGNOSIS — Z79899 Other long term (current) drug therapy: Secondary | ICD-10-CM | POA: Insufficient documentation

## 2022-01-29 DIAGNOSIS — I10 Essential (primary) hypertension: Secondary | ICD-10-CM | POA: Insufficient documentation

## 2022-01-29 DIAGNOSIS — R000 Tachycardia, unspecified: Secondary | ICD-10-CM | POA: Insufficient documentation

## 2022-01-29 DIAGNOSIS — F10920 Alcohol use, unspecified with intoxication, uncomplicated: Secondary | ICD-10-CM

## 2022-01-29 DIAGNOSIS — R55 Syncope and collapse: Secondary | ICD-10-CM | POA: Diagnosis not present

## 2022-01-29 DIAGNOSIS — Z9682 Presence of neurostimulator: Secondary | ICD-10-CM | POA: Insufficient documentation

## 2022-01-29 DIAGNOSIS — R404 Transient alteration of awareness: Secondary | ICD-10-CM | POA: Diagnosis not present

## 2022-01-29 DIAGNOSIS — Z20822 Contact with and (suspected) exposure to covid-19: Secondary | ICD-10-CM | POA: Diagnosis not present

## 2022-01-29 DIAGNOSIS — F10129 Alcohol abuse with intoxication, unspecified: Secondary | ICD-10-CM | POA: Diagnosis not present

## 2022-01-29 LAB — BASIC METABOLIC PANEL
Anion gap: 6 (ref 5–15)
BUN: 7 mg/dL (ref 6–20)
CO2: 21 mmol/L — ABNORMAL LOW (ref 22–32)
Calcium: 8.4 mg/dL — ABNORMAL LOW (ref 8.9–10.3)
Chloride: 112 mmol/L — ABNORMAL HIGH (ref 98–111)
Creatinine, Ser: 0.58 mg/dL (ref 0.44–1.00)
GFR, Estimated: >60 mL/min (ref >60)
Glucose, Bld: 84 mg/dL (ref 70–99)
Potassium: 4.5 mmol/L (ref 3.5–5.1)
Sodium: 139 mmol/L (ref 135–145)

## 2022-01-29 LAB — CBC WITH DIFFERENTIAL/PLATELET
Abs Immature Granulocytes: 0.01 10*3/uL (ref 0.00–0.07)
Abs Immature Granulocytes: 0.01 10*3/uL (ref 0.00–0.07)
Basophils Absolute: 0 10*3/uL (ref 0.0–0.1)
Basophils Absolute: 0 10*3/uL (ref 0.0–0.1)
Basophils Relative: 0 %
Basophils Relative: 1 %
Eosinophils Absolute: 0.1 10*3/uL (ref 0.0–0.5)
Eosinophils Absolute: 0.1 10*3/uL (ref 0.0–0.5)
Eosinophils Relative: 2 %
Eosinophils Relative: 1 %
HCT: 44.3 % (ref 36.0–46.0)
HCT: 40.1 % (ref 36.0–46.0)
Hemoglobin: 15 g/dL (ref 12.0–15.0)
Hemoglobin: 13.4 g/dL (ref 12.0–15.0)
Immature Granulocytes: 0 %
Immature Granulocytes: 0 %
Lymphocytes Relative: 46 %
Lymphocytes Relative: 54 %
Lymphs Abs: 3.2 10*3/uL (ref 0.7–4.0)
Lymphs Abs: 2.9 10*3/uL (ref 0.7–4.0)
MCH: 32.1 pg (ref 26.0–34.0)
MCH: 32.4 pg (ref 26.0–34.0)
MCHC: 33.9 g/dL (ref 30.0–36.0)
MCHC: 33.4 g/dL (ref 30.0–36.0)
MCV: 94.7 fL (ref 80.0–100.0)
MCV: 97.1 fL (ref 80.0–100.0)
Monocytes Absolute: 0.6 10*3/uL (ref 0.1–1.0)
Monocytes Absolute: 0.4 10*3/uL (ref 0.1–1.0)
Monocytes Relative: 8 %
Monocytes Relative: 7 %
Neutro Abs: 3 10*3/uL (ref 1.7–7.7)
Neutro Abs: 2 10*3/uL (ref 1.7–7.7)
Neutrophils Relative %: 44 %
Neutrophils Relative %: 37 %
Platelets: 279 10*3/uL (ref 150–400)
Platelets: 264 10*3/uL (ref 150–400)
RBC: 4.68 MIL/uL (ref 3.87–5.11)
RBC: 4.13 MIL/uL (ref 3.87–5.11)
RDW: 11.9 % (ref 11.5–15.5)
RDW: 12 % (ref 11.5–15.5)
WBC: 6.9 10*3/uL (ref 4.0–10.5)
WBC: 5.4 10*3/uL (ref 4.0–10.5)
nRBC: 0 % (ref 0.0–0.2)
nRBC: 0 % (ref 0.0–0.2)

## 2022-01-29 LAB — COMPREHENSIVE METABOLIC PANEL
ALT: 20 U/L (ref 0–44)
AST: 27 U/L (ref 15–41)
Albumin: 4.7 g/dL (ref 3.5–5.0)
Alkaline Phosphatase: 62 U/L (ref 38–126)
Anion gap: 9 (ref 5–15)
BUN: 11 mg/dL (ref 6–20)
CO2: 22 mmol/L (ref 22–32)
Calcium: 9.5 mg/dL (ref 8.9–10.3)
Chloride: 110 mmol/L (ref 98–111)
Creatinine, Ser: 0.59 mg/dL (ref 0.44–1.00)
GFR, Estimated: >60 mL/min (ref >60)
Glucose, Bld: 94 mg/dL (ref 70–99)
Potassium: 3 mmol/L — ABNORMAL LOW (ref 3.5–5.1)
Sodium: 141 mmol/L (ref 135–145)
Total Bilirubin: 0.8 mg/dL (ref 0.3–1.2)
Total Protein: 7.5 g/dL (ref 6.5–8.1)

## 2022-01-29 LAB — URINALYSIS, ROUTINE W REFLEX MICROSCOPIC
Bilirubin Urine: NEGATIVE
Glucose, UA: 150 mg/dL — AB
Hgb urine dipstick: NEGATIVE
Ketones, ur: NEGATIVE mg/dL
Leukocytes,Ua: NEGATIVE
Nitrite: NEGATIVE
Protein, ur: NEGATIVE mg/dL
Specific Gravity, Urine: 1.001 — ABNORMAL LOW (ref 1.005–1.030)
pH: 6 (ref 5.0–8.0)

## 2022-01-29 LAB — SALICYLATE LEVEL: Salicylate Lvl: <7 mg/dL — ABNORMAL LOW (ref 7.0–30.0)

## 2022-01-29 LAB — LACTIC ACID, PLASMA
Lactic Acid, Venous: 3.4 mmol/L (ref 0.5–1.9)
Lactic Acid, Venous: 2 mmol/L (ref 0.5–1.9)

## 2022-01-29 LAB — URINE DRUG SCREEN, QUALITATIVE (ARMC ONLY)
Amphetamines, Ur Screen: NOT DETECTED
Barbiturates, Ur Screen: NOT DETECTED
Benzodiazepine, Ur Scrn: POSITIVE — AB
Cannabinoid 50 Ng, Ur ~~LOC~~: NOT DETECTED
Cocaine Metabolite,Ur ~~LOC~~: NOT DETECTED
MDMA (Ecstasy)Ur Screen: NOT DETECTED
Methadone Scn, Ur: NOT DETECTED
Opiate, Ur Screen: NOT DETECTED
Phencyclidine (PCP) Ur S: NOT DETECTED
Tricyclic, Ur Screen: NOT DETECTED

## 2022-01-29 LAB — ETHANOL: Alcohol, Ethyl (B): 149 mg/dL — ABNORMAL HIGH (ref <10)

## 2022-01-29 LAB — ACETAMINOPHEN LEVEL: Acetaminophen (Tylenol), Serum: <10 ug/mL — ABNORMAL LOW (ref 10–30)

## 2022-01-29 LAB — PROCALCITONIN: Procalcitonin: <0.1 ng/mL

## 2022-01-29 LAB — CK: Total CK: 361 U/L — ABNORMAL HIGH (ref 38–234)

## 2022-01-29 LAB — MAGNESIUM: Magnesium: 2.4 mg/dL (ref 1.7–2.4)

## 2022-01-29 LAB — PROTIME-INR
INR: 0.9 (ref 0.8–1.2)
Prothrombin Time: 12.5 seconds (ref 11.4–15.2)

## 2022-01-29 LAB — MONONUCLEOSIS SCREEN: Mono Screen: NEGATIVE

## 2022-01-29 LAB — SARS CORONAVIRUS 2 BY RT PCR: SARS Coronavirus 2 by RT PCR: NEGATIVE

## 2022-01-29 MED ORDER — LEVETIRACETAM IN NACL 1000 MG/100ML IV SOLN
1000.0000 mg | Freq: Once | INTRAVENOUS | Status: AC
  Administered 2022-01-29: 1000 mg via INTRAVENOUS
  Filled 2022-01-29: qty 100

## 2022-01-29 MED ORDER — POTASSIUM CHLORIDE 10 MEQ/100ML IV SOLN
10.0000 meq | Freq: Once | INTRAVENOUS | Status: AC
  Administered 2022-01-29: 10 meq via INTRAVENOUS

## 2022-01-29 MED ORDER — SODIUM CHLORIDE 0.9 % IV BOLUS
1000.0000 mL | Freq: Once | INTRAVENOUS | Status: AC
  Administered 2022-01-29: 1000 mL via INTRAVENOUS

## 2022-01-29 MED ORDER — POTASSIUM CHLORIDE 10 MEQ/100ML IV SOLN
10.0000 meq | Freq: Once | INTRAVENOUS | Status: DC
  Filled 2022-01-29: qty 100

## 2022-01-29 NOTE — Consult Note (Addendum)
Neurology Consultation  Reason for Consult: Seizure, status epilepticus Referring Physician: Dr. Lockie Mola  CC: Seizures  History is obtained from: Chart  HPI: Pamela Booker is a 26 y.o. 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  She was seen in urgent care on 01/25/2022 for cough congestion and sore throat along with shortness of breath and chest pain and had a negative COVID-19 swab.  She was placed on prednisone.  This is extended history that can be obtained from the HPI from [facility]. There was concern for ongoing status epilepticus and due to nonavailability of EEG at that hospital, she was sent over as an ED to ED transfer to [facility]. She is still not providing me much history by herself.   ROS: Full ROS was performed but unable to ascertain due to patient's mentation.  She was able to nod yes and no to some questions.  She did say she is having a headache since yesterday.  She also said she is having some chest discomfort.  She denied any shortness of breath.  Past Medical History:  Diagnosis Date   Anxiety    Encounter for loop recorder check 06/10/2019   Epilepsy (HCC)    Hypertension    Loop: Biotronic loop recorder in situ 05/06/2019   Scheduled Remote loop recorder check 06/09/2019: This represents an unremarkable monitoring session. The presenting rhythm is not available. No symptomatic or automatic episodes were triggered. Daily activity trends are stable. Heart rate variability trends are stable.   Migraines    Palpitations    PVC (premature ventricular contraction)    PVC's (premature  ventricular contractions)    Seizures (HCC)    Syncope and collapse 06/10/2019     Family History  Adopted: Yes  Problem Relation Age of Onset   Healthy Daughter      Social History:   reports that she has never smoked. She has never used smokeless tobacco. She reports current alcohol use. She reports that she does not use drugs.  Medications No current facility-administered medications for this encounter.  Current Outpatient Medications:    acebutolol (SECTRAL) 200 MG capsule, TAKE ONE CAPSULE BY MOUTH TWICE A DAY DISCONTINUE VERAPAMIL (Patient taking differently: Take 200 mg by mouth 2 (two) times daily. TAKE ONE CAPSULE BY MOUTH TWICE A DAY DISCONTINUE VERAPAMIL), Disp: 60 capsule, Rfl: 3   buPROPion (WELLBUTRIN XL) 300 MG 24 hr tablet, Take 300 mg by mouth every morning., Disp: , Rfl:    butalbital-acetaminophen-caffeine (FIORICET) 50-325-40 MG tablet, Take 1-2 tablets by mouth every 6 (six) hours as needed for headache. (Patient not taking: Reported on 11/28/2021), Disp: 10 tablet, Rfl: 0   ciprofloxacin-dexamethasone (CIPRODEX) OTIC suspension, Place 4 drops into both ears 2 (two) times daily., Disp: 7.5 mL, Rfl: 0   diazepam (VALIUM) 5 MG tablet, Take 5 mg by mouth 2 (two) times daily as needed for muscle spasms., Disp: , Rfl:    escitalopram (LEXAPRO) 20 MG tablet, Take 20 mg by mouth daily. , Disp: , Rfl:    estradiol (VIVELLE-DOT) 0.075 MG/24HR, Place 1 patch onto the skin as needed (period bleeding)., Disp: , Rfl:    fluticasone (FLONASE)  50 MCG/ACT nasal spray, Place 2 sprays into both nostrils daily., Disp: 16 g, Rfl: 0   HYDROcodone-acetaminophen (NORCO/VICODIN) 5-325 MG tablet, Take 1 to 2 tablets by mouth every 6 (six) hours as needed., Disp: 10 tablet, Rfl: 0   lamoTRIgine (LAMICTAL) 25 MG tablet, PLEASE SEE ATTACHED FOR DETAILED DIRECTIONS, Disp: , Rfl:    levETIRAcetam (KEPPRA) 500 MG tablet, Take 1 tablet (500 mg total) by mouth 2 (two) times daily., Disp: 60 tablet,  Rfl: 0   NAYZILAM 5 MG/0.1ML SOLN, Place into both nostrils daily., Disp: , Rfl:    norethindrone (AYGESTIN) 5 MG tablet, Take 5 mg by mouth daily., Disp: , Rfl:    oxyCODONE (ROXICODONE) 5 MG immediate release tablet, Take 1 tablet (5 mg total) by mouth every 8 (eight) hours as needed for up to 6 doses for breakthrough pain., Disp: 6 tablet, Rfl: 0   predniSONE (DELTASONE) 20 MG tablet, Take 2 tablets (40 mg total) by mouth daily., Disp: 10 tablet, Rfl: 0   PRESCRIPTION MEDICATION, Apply 1 application topically every other day. Apply to face at night - Hers 20 ml Acne ( pump) Az/Zp (0.0125%), Disp: , Rfl:    rizatriptan (MAXALT-MLT) 10 MG disintegrating tablet, Take 10 mg by mouth daily as needed for migraine. , Disp: , Rfl:    sertraline (ZOLOFT) 100 MG tablet, Take 100 mg by mouth daily., Disp: , Rfl:    tiZANidine (ZANAFLEX) 4 MG tablet, Take 4-8 mg by mouth as needed for muscle spasms., Disp: , Rfl:    topiramate (TOPAMAX) 100 MG tablet, Take 100 mg by mouth daily., Disp: , Rfl:    valACYclovir (VALTREX) 500 MG tablet, Take 500 mg by mouth daily., Disp: , Rfl:    XCOPRI 14 x 150 MG & 14 x200 MG TBPK, Take 200 mg by mouth at bedtime., Disp: , Rfl:    Exam: Current vital signs: BP 107/75 (BP Location: Left Arm)   Pulse 90   Temp 98.3 F (36.8 C) (Oral)   Resp (!) 24   LMP 01/04/2022 (Approximate)   SpO2 95%  Vital signs in last 24 hours: Temp:  [98.1 F (36.7 C)-98.4 F (36.9 C)] 98.3 F (36.8 C) (08/27 0839) Pulse Rate:  [80-122] 90 (08/27 0839) Resp:  [16-35] 24 (08/27 0839) BP: (101-129)/(60-94) 107/75 (08/27 0839) SpO2:  [95 %-100 %] 95 % (08/27 0839) General: Sleeping in bed, opens eyes to voice. HNT: Normocephalic atraumatic Lungs: Clear Abdomen nondistended nontender CVs: Regular rhythm Neurological exam She is sleeping in bed, opens eyes to voice.  Appears drowsy and has somewhat of fluttering of her eyelids at intermittent times during this encounter. She did not  really verbalize things but nodded yes and no.  She was able to mumble yes and no at certain times.  Her inability to talk appears somewhat volitional. Cranial nerves: Pupils are equal round react light, extract movements intact, visual fields full, face appears symmetric. Motor examination with antigravity strength in bilateral upper extremities upon coaching with giveaway weakness and arms falling down on the bed without coaching.  She is able to wiggle both toes. Sensation intact to light touch on both legs Coordination testing was not possible due to her cooperation    Labs I have reviewed labs in epic and the results pertinent to this consultation are: CBC    Component Value Date/Time   WBC 6.9 01/29/2022 0507   RBC 4.68 01/29/2022 0507   HGB 15.0 01/29/2022 0507   HCT 44.3 01/29/2022 0507  PLT 279 01/29/2022 0507   MCV 94.7 01/29/2022 0507   MCH 32.1 01/29/2022 0507   MCHC 33.9 01/29/2022 0507   RDW 11.9 01/29/2022 0507   LYMPHSABS 3.2 01/29/2022 0507   MONOABS 0.6 01/29/2022 0507   EOSABS 0.1 01/29/2022 0507   BASOSABS 0.0 01/29/2022 0507    CMP     Component Value Date/Time   NA 141 01/29/2022 0507   K 3.0 (L) 01/29/2022 0507   CL 110 01/29/2022 0507   CO2 22 01/29/2022 0507   GLUCOSE 94 01/29/2022 0507   BUN 11 01/29/2022 0507   CREATININE 0.59 01/29/2022 0507   CALCIUM 9.5 01/29/2022 0507   PROT 7.5 01/29/2022 0507   ALBUMIN 4.7 01/29/2022 0507   AST 27 01/29/2022 0507   ALT 20 01/29/2022 0507   ALKPHOS 62 01/29/2022 0507   BILITOT 0.8 01/29/2022 0507   GFRNONAA >60 01/29/2022 0507   GFRAA >60 03/11/2019 1300   Imaging I have reviewed the images obtained:  CT-head no acute changes  Assessment: 26 year old woman who has a past history of 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 I do not think that she  is currently seizing at this time. During her past admission in February 2023 there were multiple events captured on EEG which were nonepileptic and it was deemed that the increasing frequency of seizure episodes were nonepileptic spells in the setting of stressors. Unclear if there are any recent stressors as she is unable to fully provide history at this time. She was transferred from the outside hospital due to nonavailability of EEG at that hospital.  Recommendations Continue home dosage of AEDs: -Xcopri 200 mg nightly -Keppra 500 mg twice daily -Topiramate 100 mg nightly Maintain seizure precautions Hook up to LTM EEG to capture any ongoing nonconvulsive seizure activity as well as to evaluate for any future episodes. Check UA chest x-ray. Check CBC BMP Neurology will follow with you.  Plan relayed to Dr Ophelia Charter -- Milon Dikes, MD Neurologist [facility] Pager: [phone]   ADDENDUM  Patient left the ER AMA  -- Milon Dikes, MD Neurologist [facility] Pager: [phone]

## 2022-01-29 NOTE — ED Triage Notes (Signed)
Per EMS, Pt not feeling well and began seizing. Pt has not had a seizure in 2 months and has not had back to back since receiving stimulator

## 2022-01-29 NOTE — ED Notes (Signed)
Critical lactic acid 2.0.  MD Yates aware.  No new orders at this time.

## 2022-01-29 NOTE — ED Notes (Signed)
Attempted to call report to [facility]. No answer. Will attempt again later.

## 2022-01-29 NOTE — ED Notes (Signed)
Pt has been very adamant about leaving. Pt educated about risks and potential of death. She has signed out AMA.  Electronic signature in chart along with this RN signature.  MD Yates aware and in room as IV being pulled out.  Pt will be leaving with boyfriend and child.

## 2022-01-29 NOTE — ED Notes (Signed)
Pt was able to open eyes when asked, but they would close. Pt asked about her daughter and responded to her name. Pt appears to be postictal. Pt's HR has also decreased to WNL

## 2022-01-29 NOTE — ED Notes (Signed)
Report given to [facility]. 

## 2022-01-29 NOTE — ED Provider Notes (Signed)
Supervise resident visit.  Patient transferred from [facility] for admission for long-term monitoring.  History of seizures and pseudoseizures.  Has a vagus nerve stimulator.  Polysubstance abuse history.  History of anxiety and migraines as well.  Per my chart review patient had elevated alcohol level to 150 but otherwise lab work was unremarkable.  She had unremarkable head imaging.  Upon my evaluation she follows commands, she seems slightly somnolent.  Appears that she was loaded with Keppra.  Dr. Jerrell Belfast with neurology has come down to the ED to evaluate her as well.  They will set up LTM and make further adjustments to her medications as needed.  Otherwise she is hemodynamically stable.  Recheck in some basic labs.  This chart was dictated using voice recognition software.  Despite best efforts to proofread,  errors can occur which can change the documentation meaning.    Virgina Norfolk, DO 01/29/22 [phone]

## 2022-01-29 NOTE — Progress Notes (Addendum)
This is a progress note, not H&P - the patient left AMA prior to my evaluation.   Patient: Pamela Booker KXF:[id number] DOB: [birth year] DOA: 01/29/2022 DOS: the patient was seen and examined on 01/29/2022 PCP: Patient, No Pcp Per    Chief Complaint: Seizures  HPI: Pamela Booker is a 26 y.o. 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    ER Course:  At [facility], h/o seizures and pseudoseizures.  Presented with recurrent seizures.  Needs LTM EEG.  Neurology has seen.   Past Medical History:  Diagnosis Date   Anxiety    Encounter for loop recorder check 06/10/2019   Epilepsy (HCC)    Hypertension    Loop: Biotronic loop recorder in situ 05/06/2019   Scheduled Remote loop recorder check 06/09/2019: This represents an unremarkable monitoring session. The presenting rhythm is not available. No symptomatic or automatic episodes were triggered. Daily activity trends are stable. Heart rate variability trends are stable.   Migraines    Palpitations    PVC (premature ventricular contraction)    PVC's (premature ventricular contractions)    Seizures (HCC)    Syncope and collapse 06/10/2019   Past Surgical History:  Procedure Laterality Date   LAPAROSCOPY     TONSILLECTOMY     VAGUS NERVE STIMULATOR INSERTION     Social History:  reports that she has never smoked. She has never used smokeless tobacco. She reports current alcohol use. She reports that she does not use drugs.  Allergies  Allergen Reactions   Nsaids Other (See Comments)    History of bleeding ulcers   Benadryl [Diphenhydramine] Other (See Comments)    "Crawling feeling under skin"   Compazine [Prochlorperazine]    Ibuprofen     Other reaction(s): GI Upset (intolerance)   Emgality [Galcanezumab-Gnlm] Rash    Family History  Adopted: Yes  Problem Relation Age of  Onset   Healthy Daughter     Prior to Admission medications   Medication Sig Start Date End Date Taking? Authorizing Provider  acebutolol (SECTRAL) 200 MG capsule TAKE ONE CAPSULE BY MOUTH TWICE A DAY DISCONTINUE VERAPAMIL Patient taking differently: Take 200 mg by mouth 2 (two) times daily. TAKE ONE CAPSULE BY MOUTH TWICE A DAY DISCONTINUE VERAPAMIL 06/02/21   Cantwell, Celeste C, PA-C  buPROPion (WELLBUTRIN XL) 300 MG 24 hr tablet Take 300 mg by mouth every morning. 11/03/20   [provider]  butalbital-acetaminophen-caffeine (FIORICET) 50-325-40 MG tablet Take 1-2 tablets by mouth every 6 (six) hours as needed for headache. Patient not taking: Reported on 11/28/2021 12/04/20   Gilda Crease, MD  ciprofloxacin-dexamethasone (CIPRODEX) OTIC suspension Place 4 drops into both ears 2 (two) times daily. 10/05/21   Mardella Layman, MD  diazepam (VALIUM) 5 MG tablet Take 5 mg by mouth 2 (two) times daily as needed for muscle spasms. 05/31/21   [provider]  escitalopram (LEXAPRO) 20 MG tablet Take 20 mg by mouth daily.  08/23/18   [provider]  estradiol (VIVELLE-DOT) 0.075 MG/24HR Place 1 patch onto the skin as needed (period bleeding). 03/02/21   [provider]  fluticasone (FLONASE) 50 MCG/ACT nasal spray Place 2 sprays into both nostrils daily. 09/02/21   Domenick Gong, MD  HYDROcodone-acetaminophen (NORCO/VICODIN) 5-325 MG tablet Take 1 to 2 tablets by mouth every 6 (six) hours as needed. 08/01/21   Haskel Schroeder, PA-C  lamoTRIgine (LAMICTAL) 25  MG tablet PLEASE SEE ATTACHED FOR DETAILED DIRECTIONS 09/22/21   [provider]  levETIRAcetam (KEPPRA) 500 MG tablet Take 1 tablet (500 mg total) by mouth 2 (two) times daily. 05/06/20   Gerhard Munch, MD  NAYZILAM 5 MG/0.1ML SOLN Place into both nostrils daily. 08/19/21   [provider]  norethindrone (AYGESTIN) 5 MG tablet Take 5 mg by mouth daily. 02/26/19 07/05/21  [provider]  oxyCODONE (ROXICODONE) 5 MG immediate release tablet Take 1 tablet (5 mg total) by mouth every 8 (eight) hours as needed for up to 6 doses for breakthrough pain. 09/15/21   Curatolo, Adam, DO  predniSONE (DELTASONE) 20 MG tablet Take 2 tablets (40 mg total) by mouth daily. 01/25/22   Valinda Hoar, NP  PRESCRIPTION MEDICATION Apply 1 application topically every other day. Apply to face at night - Hers 20 ml Acne ( pump) Az/Zp (0.0125%)    [provider]  rizatriptan (MAXALT-MLT) 10 MG disintegrating tablet Take 10 mg by mouth daily as needed for migraine.  03/17/19   [provider]  sertraline (ZOLOFT) 100 MG tablet Take 100 mg by mouth daily. 09/13/21   [provider]  tiZANidine (ZANAFLEX) 4 MG tablet Take 4-8 mg by mouth as needed for muscle spasms. 06/08/21   [provider]  topiramate (TOPAMAX) 100 MG tablet Take 100 mg by mouth daily.    [provider]  valACYclovir (VALTREX) 500 MG tablet Take 500 mg by mouth daily.    [provider]  XCOPRI 14 x 150 MG & 14 x200 MG TBPK Take 200 mg by mouth at bedtime. 11/11/20   [provider]  omeprazole (PRILOSEC) 20 MG capsule Take 1 capsule (20 mg total) by mouth daily for 14 days. Patient not taking: Reported on 05/06/2020 11/03/19 10/02/20  Darr, Gerilyn Pilgrim, PA-C    Physical Exam: Vitals:   01/29/22 0839  BP: 107/75  Pulse: 90  Resp: (!) 24  Temp: 98.3 F (36.8 C)  TempSrc: Oral  SpO2: 95%    Radiological Exams on Admission: Independently reviewed - see discussion in A/P where applicable  CT Head Wo Contrast  Result Date: 01/29/2022 CLINICAL DATA:  26 year old female with history of altered mental status. Seizure. EXAM: CT HEAD WITHOUT CONTRAST TECHNIQUE: Contiguous axial images were obtained from the base of the skull through the vertex without intravenous contrast. RADIATION DOSE REDUCTION: This exam was performed according to the departmental dose-optimization  program which includes automated exposure control, adjustment of the mA and/or kV according to patient size and/or use of iterative reconstruction technique. COMPARISON:  Head CT 07/04/2021. FINDINGS: Brain: No evidence of acute infarction, hemorrhage, hydrocephalus, extra-axial collection or mass lesion/mass effect. Vascular: No hyperdense vessel or unexpected calcification. Skull: Normal. Negative for fracture or focal lesion. Sinuses/Orbits: No acute finding. Other: None. IMPRESSION: 1. No acute intracranial abnormalities. The appearance of the brain is normal. Electronically Signed   By: Trudie Reed M.D.   On: 01/29/2022 06:23   DG Chest Port 1 View  Result Date: 01/29/2022 CLINICAL DATA:  26 year old female with history of seizures. EXAM: PORTABLE CHEST 1 VIEW COMPARISON:  Chest x-ray 11/28/2021. FINDINGS: Lung volumes are normal. No consolidative airspace disease. No pleural effusions. No pneumothorax. No pulmonary nodule or mass noted. Pulmonary vasculature and the cardiomediastinal silhouette are within normal limits. Electronic device projecting over the left hemithorax with lead tip extending cephalad, presumably a vagal nerve stimulator or deep brain stimulator. IMPRESSION: 1. No radiographic evidence of acute cardiopulmonary disease.  Electronically Signed   By: Trudie Reed M.D.   On: 01/29/2022 06:22    EKG: Independently reviewed.  Sinus tachycardia with rate 124; nonspecific ST changes that may be rate related   Labs on Admission: I have personally reviewed the available labs and imaging studies at the time of the admission.  Pertinent labs:    Unremarkable BMP Lactate 3.4, 2.0 Procalcitonin <0.10 Normal CBC APAP <10 ASA <7 UA: 150 glucose UDS + BZD   Patient decided to leave AGAINST MEDICAL ADVICE  Recommendations for evaluation per neurology were:  Continue home dosage of AEDs:  -Xcopri 200 mg nightly  -Keppra 500 mg twice daily  -Topiramate 100 mg nightly   Maintain seizure precautions  Hook up to LTM EEG to capture any ongoing nonconvulsive seizure activity as well as to evaluate for any future episodes.  Check UA, chest x-ray, CBC, BMP   Unfortunately, she decided to leave AMA prior to my evaluation.     Author: Jonah Blue, MD 01/29/2022 9:10 AM  For on call review [email or web address].

## 2022-01-29 NOTE — ED Provider Notes (Signed)
[facility] Provider Note    Event Date/Time   First MD Initiated Contact with Patient 01/29/22 [phone]     (approximate)   History   Seizures   HPI  Level V caveat: Limited by postictal state  Pamela Booker is a 26 y.o. female brought to the ED via EMS from home with a chief complaint of seizures x8.  Patient with a history of both epileptic and nonepileptic seizures on Xcopri, Keppra, with recent vagal stimulator replacement.  Family called out for 6 seizures.  EMS witnessed 2 tonic-clonic seizures which stopped after vagal stimulator was placed over the scar on patient's left shoulder.  She was seen at urgent care on 01/25/2022 for cough, congestion, sore throat, shortness of breath and chest pain.  Had negative COVID-19 swab and placed on Prednisone.  Rest of history is limited by patient's postictal state.     Past Medical History   Past Medical History:  Diagnosis Date   Anxiety    Encounter for loop recorder check 06/10/2019   Epilepsy (HCC)    Hypertension    Loop: Biotronic loop recorder in situ 05/06/2019   Scheduled Remote loop recorder check 06/09/2019: This represents an unremarkable monitoring session. The presenting rhythm is not available. No symptomatic or automatic episodes were triggered. Daily activity trends are stable. Heart rate variability trends are stable.   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 Authorizing Provider  acebutolol (SECTRAL) 200 MG capsule TAKE ONE CAPSULE BY MOUTH TWICE A DAY DISCONTINUE VERAPAMIL Patient taking differently: Take 200 mg by mouth 2 (two) times daily. TAKE ONE CAPSULE BY MOUTH TWICE A DAY DISCONTINUE VERAPAMIL 06/02/21   Cantwell, Celeste C, PA-C  buPROPion (WELLBUTRIN XL) 300 MG 24 hr tablet Take 300 mg by mouth every morning. 11/03/20   [provider]  butalbital-acetaminophen-caffeine (FIORICET) 50-325-40 MG tablet Take 1-2 tablets by mouth every 6 (six) hours as needed for headache. Patient not taking: Reported on 11/28/2021 12/04/20   Gilda CreasePollina, Christopher J, MD  ciprofloxacin-dexamethasone (CIPRODEX) OTIC suspension Place 4 drops into both ears 2 (two) times daily. 10/05/21   Mardella LaymanHagler, Brian, MD  diazepam (VALIUM) 5 MG tablet Take 5 mg by mouth 2 (two) times daily as needed for muscle spasms. 05/31/21   [provider]  escitalopram (LEXAPRO) 20 MG tablet Take 20 mg by mouth daily.  08/23/18   [provider]  estradiol (VIVELLE-DOT) 0.075 MG/24HR Place 1 patch onto the skin as needed (period bleeding). 03/02/21   [provider]  fluticasone (FLONASE) 50 MCG/ACT nasal spray Place 2 sprays into both nostrils daily. 09/02/21   Domenick GongMortenson, Ashley, MD  HYDROcodone-acetaminophen (NORCO/VICODIN) 5-325 MG tablet Take 1 to 2 tablets by mouth every 6 (six) hours as needed. 08/01/21   Haskel Schroeder, PA-C  lamoTRIgine (LAMICTAL) 25 MG tablet PLEASE SEE ATTACHED FOR DETAILED DIRECTIONS 09/22/21   [provider]  levETIRAcetam (KEPPRA) 500 MG tablet Take 1 tablet (500 mg total) by mouth 2 (two) times daily. 05/06/20   Gerhard Munch, MD  NAYZILAM 5 MG/0.1ML SOLN Place into  both nostrils daily. 08/19/21   [provider]  norethindrone (AYGESTIN) 5 MG tablet Take 5 mg by mouth daily. 02/26/19 07/05/21  [provider]  oxyCODONE (ROXICODONE) 5 MG immediate release tablet Take 1 tablet (5 mg total) by mouth every 8 (eight) hours as needed for up to 6 doses for breakthrough pain. 09/15/21   Curatolo, Adam, DO  predniSONE (DELTASONE) 20 MG tablet Take 2 tablets (40 mg total) by mouth daily. 01/25/22   Valinda Hoar, NP  PRESCRIPTION MEDICATION Apply 1 application topically every other day. Apply to face at night - Hers 20 ml Acne ( pump) Az/Zp (0.0125%)    [provider]  rizatriptan (MAXALT-MLT) 10 MG disintegrating tablet Take 10 mg by mouth daily as needed for migraine.  03/17/19   [provider]  sertraline (ZOLOFT) 100 MG tablet Take 100 mg by mouth daily. 09/13/21   [provider]  tiZANidine (ZANAFLEX) 4 MG tablet Take 4-8 mg by mouth as needed for muscle spasms. 06/08/21   [provider]  topiramate (TOPAMAX) 100 MG tablet Take 100 mg by mouth daily.    [provider]  valACYclovir (VALTREX) 500 MG tablet Take 500 mg by mouth daily.    [provider]  XCOPRI 14 x 150 MG & 14 x200 MG TBPK Take 200 mg by mouth at bedtime. 11/11/20   [provider]  omeprazole (PRILOSEC) 20 MG capsule Take 1 capsule (20 mg total) by mouth daily for 14 days. Patient not taking: Reported on 05/06/2020 11/03/19 10/02/20  Darr, Gerilyn Pilgrim, PA-C     Allergies  Nsaids, Benadryl [diphenhydramine], Compazine [prochlorperazine], Ibuprofen, and Emgality [galcanezumab-gnlm]   Family History   Family History  Adopted: Yes  Problem Relation Age of Onset   Healthy Daughter      Physical Exam  Triage Vital Signs: ED Triage Vitals  Enc Vitals Group     BP 01/29/22 0456 (!) 129/94     Pulse Rate 01/29/22 0456 (!) 122     Resp 01/29/22 0456 17     Temp --      Temp src --      SpO2 01/29/22 0453 98 %      Weight --      Height --      Head Circumference --      Peak Flow --      Pain Score --      Pain Loc --      Pain Edu? --      Excl. in GC? --     Updated Vital Signs: BP 101/78   Pulse (!) 106   Temp 98.1 F (36.7 C) (Rectal)   Resp (!) 35   LMP 01/04/2022 (Approximate)   SpO2 100%    General: Unresponsive.  CV:  Tachycardic.  Good peripheral perfusion.  Resp:  Normal effort.  CTAB. Abd:  Nontender.  No distention.  Other:  + gag. No petechiae.  No external evidence of injury.  Minimal response to painful stimuli.   ED Results / Procedures /  Treatments  Labs (all labs ordered are listed, but only abnormal results are displayed) Labs Reviewed  LACTIC ACID, PLASMA - Abnormal; Notable for the following components:      Result Value   Lactic Acid, Venous 3.4 (*)    All other components within normal limits  COMPREHENSIVE METABOLIC PANEL - Abnormal; Notable for the following components:   Potassium 3.0 (*)    All other components within normal limits  ETHANOL - Abnormal; Notable for the following components:   Alcohol, Ethyl (B) 149 (*)    All other components within normal limits  ACETAMINOPHEN LEVEL - Abnormal; Notable for the following components:   Acetaminophen (Tylenol), Serum <10 (*)    All other components within normal limits  SALICYLATE LEVEL - Abnormal; Notable for the following components:   Salicylate Lvl <7.0 (*)    All other components within normal limits  URINALYSIS, ROUTINE W REFLEX MICROSCOPIC - Abnormal; Notable for the following components:   Color, Urine COLORLESS (*)    APPearance CLEAR (*)    Specific Gravity, Urine 1.001 (*)    Glucose, UA 150 (*)    All other components within normal limits  URINE DRUG SCREEN, QUALITATIVE ([facility] ONLY) - Abnormal; Notable for the following components:   Benzodiazepine, Ur Scrn POSITIVE (*)    All other components within normal limits  SARS CORONAVIRUS 2 BY RT PCR  CULTURE, BLOOD (ROUTINE X 2)  CULTURE,  BLOOD (ROUTINE X 2)  URINE CULTURE  CBC WITH DIFFERENTIAL/PLATELET  PROTIME-INR  PROCALCITONIN  MONONUCLEOSIS SCREEN  LACTIC ACID, PLASMA  LEVETIRACETAM LEVEL  POC URINE PREG, ED     EKG  ED ECG REPORT I, , J, the attending physician, personally viewed and interpreted this ECG.   Date: 01/29/2022  EKG Time: 0455  Rate: 124  Rhythm: sinus tachycardia  Axis: Normal  Intervals:none  ST&T Change: Nonspecific    RADIOLOGY I have independently visualized patient's CT head and chest x-ray as well as noted the radiology interpretation:  CT head: No ICH  Chest x-ray: No acute cardiopulmonary process  Official radiology report(s): CT Head Wo Contrast  Result Date: 01/29/2022 CLINICAL DATA:  26 year old female with history of altered mental status. Seizure. EXAM: CT HEAD WITHOUT CONTRAST TECHNIQUE: Contiguous axial images were obtained from the base of the skull through the vertex without intravenous contrast. RADIATION DOSE REDUCTION: This exam was performed according to the departmental dose-optimization program which includes automated exposure control, adjustment of the mA and/or kV according to patient size and/or use of iterative reconstruction technique. COMPARISON:  Head CT 07/04/2021. FINDINGS: Brain: No evidence of acute infarction, hemorrhage, hydrocephalus, extra-axial collection or mass lesion/mass effect. Vascular: No hyperdense vessel or unexpected calcification. Skull: Normal. Negative for fracture or focal lesion. Sinuses/Orbits: No acute finding. Other: None. IMPRESSION: 1. No acute intracranial abnormalities. The appearance of the brain is normal. Electronically Signed   By: Trudie Reed M.D.   On: 01/29/2022 06:23   DG Chest Port 1 View  Result Date: 01/29/2022 CLINICAL DATA:  26 year old female with history of seizures. EXAM: PORTABLE CHEST 1 VIEW COMPARISON:  Chest x-ray 11/28/2021. FINDINGS: Lung volumes are normal. No consolidative airspace disease.  No pleural effusions. No pneumothorax. No pulmonary nodule or mass noted. Pulmonary vasculature and the cardiomediastinal silhouette are within normal limits. Electronic device projecting over the left hemithorax with lead tip extending cephalad, presumably a vagal nerve stimulator or deep brain stimulator. IMPRESSION: 1. No radiographic evidence of acute cardiopulmonary disease. Electronically Signed   By: Reuel Boom  Entrikin M.D.   On: 01/29/2022 06:22     PROCEDURES:  Critical Care performed: Yes, see critical care procedure note(s)  CRITICAL CARE Performed by: Irean Hong   Total critical care time: 45 minutes  Critical care time was exclusive of separately billable procedures and treating other patients.  Critical care was necessary to treat or prevent imminent or life-threatening deterioration.  Critical care was time spent personally by me on the following activities: development of treatment plan with patient and/or surrogate as well as nursing, discussions with consultants, evaluation of patient's response to treatment, examination of patient, obtaining history from patient or surrogate, ordering and performing treatments and interventions, ordering and review of laboratory studies, ordering and review of radiographic studies, pulse oximetry and re-evaluation of patient's condition.   Marland Kitchen1-3 Lead EKG Interpretation  Performed by: Irean Hong, MD Authorized by: Irean Hong, MD     Interpretation: abnormal     ECG rate:  120   ECG rate assessment: tachycardic     Rhythm: sinus tachycardia     Ectopy: none     Conduction: normal   Comments:     Patient placed on cardiac monitor to evaluate for arrhythmias    MEDICATIONS ORDERED IN ED: Medications  potassium chloride 10 mEq in 100 mL IVPB (has no administration in time range)  potassium chloride 10 mEq in 100 mL IVPB (10 mEq Intravenous New Bag/Given 01/29/22 0612)  sodium chloride 0.9 % bolus 1,000 mL (0 mLs Intravenous  Stopped 01/29/22 0613)  levETIRAcetam (KEPPRA) IVPB 1000 mg/100 mL premix (0 mg Intravenous Stopped 01/29/22 0523)     IMPRESSION / MDM / ASSESSMENT AND PLAN / ED COURSE  I reviewed the triage vital signs and the nursing notes.                             26 year old female with a history of both epileptic and nonepileptic seizures presenting with seizures x8. Differential diagnosis includes, but is not limited to, alcohol, illicit or prescription medications, or other toxic ingestion; intracranial pathology such as stroke or intracerebral hemorrhage; fever or infectious causes including sepsis; hypoxemia and/or hypercarbia; uremia; trauma; endocrine related disorders such as diabetes, hypoglycemia, and thyroid-related diseases; hypertensive encephalopathy; etc. I have personally reviewed patient's records and note her urgent care visit from 01/25/2022 for viral symptoms with negative COVID test.  I have also noted her op note from 12/12/2021 for VNS replacement.  I have noted that patient was hospitalized at [facility] in January of this year for seizures.  Patient's presentation is most consistent with acute presentation with potential threat to life or bodily function.  The patient is on the cardiac monitor to evaluate for evidence of arrhythmia and/or significant heart rate changes.  We will obtain sepsis lab work including toxicological labs and urine, CT head, chest x-ray.  Will obtain rapid strep swab, Monospot and respiratory panel.  Looks like patient takes Keppra 500 mg twice daily; will give 1 g IV load.  Administer IV fluid resuscitation.  Anticipate patient will require transfer to [facility] for continuous EEG monitoring.  Clinical Course as of 01/29/22 5993  Wynelle Link Jan 29, 2022  5701 Boyfriend has arrived to bedside.  Adds that patient took a phentermine plus Ambien last evening and may have had some alcohol as well.  Elevated lactic acid noted which is expected given  patient's multiple seizures.  I have updated boyfriend of the anticipated  plan for transfer for continuous EEG.  He verbalizes understanding and agrees with plan.  Patient opened her eyes earlier and asked about her daughter.  Patient is currently sleeping soundly in no acute distress. [JS]  0601 Laboratory results demonstrate normal WBC 6.9, mild hypokalemia with potassium 3.0, UDS positive for benzodiazepines, UA negative; EtOH elevated, COVID-19 negative.  Have called [facility] to initiate transfer. [JS]  [phone] Boyfriend's mother mentioned that this happened to patient the last time she drank alcohol.  Also, Keppra bottle was found to be empty so they are unsure if patient has been compliant. [JS]  6195 Spoke with [facility] hospitalist Dr. Martyn Malay who has tentatively accepted patient.  In-house neurologist comes on at 8 AM; will ask neuro to see patient in the ED.  If neurology determines patient may be managed in house, then will discontinue transfer.  Otherwise, will proceed with transfer to [facility].  Have updated boyfriend of plan. [JS]  [phone] Spoke with Dr. Iver Nestle, neurologist at [facility] who recommends ED to ED transfer.  Spoke with Dr. Blinda Leatherwood who accepts patient to the ED.  Boyfriend not at bedside currently; he has gone home to feed the dogs. [JS]    Clinical Course User Index [JS] Irean Hong, MD     FINAL CLINICAL IMPRESSION(S) / ED DIAGNOSES   Final diagnoses:  Status epilepticus (HCC)  Hypokalemia  Alcoholic intoxication without complication (HCC)     Rx / DC Orders   ED Discharge Orders     None        Note:  This document was prepared using Dragon voice recognition software and may include unintentional dictation errors.   Irean Hong, MD 01/29/22 [phone]

## 2022-01-29 NOTE — ED Triage Notes (Signed)
Pt BIB [facility] from [place] due to seizures.  Pt currently post-ictal.

## 2022-01-29 NOTE — ED Provider Notes (Signed)
[facility] EMERGENCY DEPARTMENT Provider Note   CSN: [id number] Arrival date & time: 01/29/22  6546     History  Chief Complaint  Patient presents with   Seizures    Pamela Booker is a 26 y.o. 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  Patient transferred here for continuous EEG monitoring.   Home Medications Prior to Admission medications   Medication Sig Start Date End Date Taking? Authorizing Provider  acebutolol (SECTRAL) 200 MG capsule TAKE ONE CAPSULE BY MOUTH TWICE A DAY DISCONTINUE VERAPAMIL Patient taking differently: Take 200 mg by mouth 2 (two) times daily. TAKE ONE CAPSULE BY MOUTH TWICE A DAY DISCONTINUE VERAPAMIL 06/02/21   Cantwell, Celeste C, PA-C  buPROPion (WELLBUTRIN XL) 300 MG 24 hr tablet Take 300 mg by mouth every morning. 11/03/20   [provider]  butalbital-acetaminophen-caffeine (FIORICET) 50-325-40 MG tablet Take 1-2 tablets by mouth every 6 (six) hours as needed for headache. Patient not taking: Reported on 11/28/2021 12/04/20   Gilda Crease, MD  ciprofloxacin-dexamethasone (CIPRODEX) OTIC suspension Place 4 drops into both ears 2 (two) times daily. 10/05/21   Mardella Layman, MD  diazepam (VALIUM) 5 MG tablet Take 5 mg by mouth 2 (two) times daily as needed for muscle spasms. 05/31/21   [provider]  escitalopram (LEXAPRO) 20 MG tablet Take 20 mg by mouth daily.  08/23/18   [provider]  estradiol (VIVELLE-DOT) 0.075 MG/24HR Place 1 patch onto the skin as needed (period bleeding). 03/02/21   [provider]  fluticasone (FLONASE) 50 MCG/ACT nasal spray Place 2 sprays into both nostrils daily. 09/02/21   Domenick Gong, MD  HYDROcodone-acetaminophen (NORCO/VICODIN) 5-325 MG tablet Take 1 to 2 tablets by mouth every 6 (six) hours  as needed. 08/01/21   Haskel Schroeder, PA-C  lamoTRIgine (LAMICTAL) 25 MG tablet PLEASE SEE ATTACHED FOR DETAILED DIRECTIONS 09/22/21   [provider]  levETIRAcetam (KEPPRA) 500 MG tablet Take 1 tablet (500 mg total) by mouth 2 (two) times daily. 05/06/20   Gerhard Munch, MD  NAYZILAM 5 MG/0.1ML SOLN Place into both nostrils daily. 08/19/21   [provider]  norethindrone (AYGESTIN) 5 MG tablet Take 5 mg by mouth daily. 02/26/19 07/05/21  [provider]  oxyCODONE (ROXICODONE) 5 MG immediate release tablet Take 1 tablet (5 mg total) by mouth every 8 (eight) hours as needed for up to 6 doses for breakthrough pain. 09/15/21   Curatolo, Adam, DO  predniSONE (DELTASONE) 20 MG tablet Take 2 tablets (40 mg total) by mouth daily. 01/25/22   Valinda Hoar, NP  PRESCRIPTION MEDICATION Apply 1 application topically every other day. Apply to face at night - Hers 20 ml Acne ( pump) Az/Zp (0.0125%)    [provider]  rizatriptan (MAXALT-MLT) 10 MG disintegrating tablet Take 10 mg by mouth daily as needed for migraine.  03/17/19   [provider]  sertraline (ZOLOFT) 100 MG tablet Take 100 mg by mouth daily. 09/13/21   [provider]  tiZANidine (ZANAFLEX) 4 MG tablet Take 4-8 mg by mouth as needed for muscle spasms. 06/08/21   [provider]  topiramate (TOPAMAX) 100 MG tablet Take 100 mg by mouth daily.    [provider]  valACYclovir (VALTREX) 500 MG tablet Take 500 mg by mouth daily.    [provider]  XCOPRI 14 x  150 MG & 14 x200 MG TBPK Take 200 mg by mouth at bedtime. 11/11/20   [provider]  omeprazole (PRILOSEC) 20 MG capsule Take 1 capsule (20 mg total) by mouth daily for 14 days. Patient not taking: Reported on 05/06/2020 11/03/19 10/02/20  Darr, Gerilyn Pilgrim, PA-C      Allergies    Nsaids, Benadryl [diphenhydramine], Compazine [prochlorperazine], Ibuprofen, and Emgality [galcanezumab-gnlm]    Review of  Systems   Review of Systems  Neurological:  Positive for seizures.    Physical Exam Updated Vital Signs BP 107/75 (BP Location: Left Arm)   Pulse 90   Temp 98.3 F (36.8 C) (Oral)   Resp (!) 24   LMP 01/04/2022 (Approximate)   SpO2 95%  Physical Exam Constitutional:      Appearance: Normal appearance. She is normal weight. She is not ill-appearing or diaphoretic.  HENT:     Head: Normocephalic and atraumatic.     Right Ear: External ear normal.     Left Ear: External ear normal.     Nose: Nose normal.     Mouth/Throat:     Mouth: Mucous membranes are moist.  Eyes:     Pupils: Pupils are equal, round, and reactive to light.  Cardiovascular:     Rate and Rhythm: Normal rate and regular rhythm.     Pulses: Normal pulses.  Pulmonary:     Effort: Pulmonary effort is normal. No respiratory distress.     Breath sounds: Normal breath sounds. No wheezing.  Abdominal:     General: Abdomen is flat. Bowel sounds are normal.     Palpations: Abdomen is soft.     Tenderness: There is no abdominal tenderness.  Musculoskeletal:     Cervical back: Neck supple.     Right lower leg: No edema.     Left lower leg: No edema.  Skin:    General: Skin is warm and dry.  Neurological:     Comments: Patient with witnessed, rhythmic shaking of upper extremities.  Patient unresponsive during this episode for approximately 1 minute.  Following this, patient opened eyes, but not following commands or speaking yet.      ED Results / Procedures / Treatments   Labs (all labs ordered are listed, but only abnormal results are displayed) Labs Reviewed  CBC WITH DIFFERENTIAL/PLATELET  BASIC METABOLIC PANEL  LACTIC ACID, PLASMA    EKG None  Radiology CT Head Wo Contrast  Result Date: 01/29/2022 CLINICAL DATA:  25 year old female with history of altered mental status. Seizure. EXAM: CT HEAD WITHOUT CONTRAST TECHNIQUE: Contiguous axial images were obtained from the base of the skull through the  vertex without intravenous contrast. RADIATION DOSE REDUCTION: This exam was performed according to the departmental dose-optimization program which includes automated exposure control, adjustment of the mA and/or kV according to patient size and/or use of iterative reconstruction technique. COMPARISON:  Head CT 07/04/2021. FINDINGS: Brain: No evidence of acute infarction, hemorrhage, hydrocephalus, extra-axial collection or mass lesion/mass effect. Vascular: No hyperdense vessel or unexpected calcification. Skull: Normal. Negative for fracture or focal lesion. Sinuses/Orbits: No acute finding. Other: None. IMPRESSION: 1. No acute intracranial abnormalities. The appearance of the brain is normal. Electronically Signed   By: Trudie Reed M.D.   On: 01/29/2022 06:23   DG Chest Port 1 View  Result Date: 01/29/2022 CLINICAL DATA:  26 year old female with history of seizures. EXAM: PORTABLE CHEST 1 VIEW COMPARISON:  Chest x-ray 11/28/2021. FINDINGS: Lung volumes are normal. No consolidative airspace disease. No pleural  effusions. No pneumothorax. No pulmonary nodule or mass noted. Pulmonary vasculature and the cardiomediastinal silhouette are within normal limits. Electronic device projecting over the left hemithorax with lead tip extending cephalad, presumably a vagal nerve stimulator or deep brain stimulator. IMPRESSION: 1. No radiographic evidence of acute cardiopulmonary disease. Electronically Signed   By: Trudie Reed M.D.   On: 01/29/2022 06:22    Procedures Procedures   Medications Ordered in ED Medications - No data to display  ED Course/ Medical Decision Making/ A&P                           Medical Decision Making Amount and/or Complexity of Data Reviewed Labs: ordered.  Risk Decision regarding hospitalization.   Pamela Booker is a 25 y.o. female with significant PMHx nonepileptic and epileptic seizures who presented to the ED with seizure-like activity and request for  LTM.  Patient is hemodynamically stable, afebrile, satting well on room air.  Physical exam notable for normal heart sounds.  Lungs clear to auscultation bilaterally.  Abdomen is soft, nontender to palpation.  No pitting edema.  Patient with seizure-like activity upon arrival to the ED with rhythmic shaking of upper extremities only.  Patient was unresponsive during this episode, however protecting airway.  Episode resolved after about a minute without intervention.  Patient opened eyes following episode, but did not follow commands.  Initial differential includes but is not limited to: Breakthrough seizures, medication noncompliance, alcohol intoxication, pseudoseizures  Lab work obtained at [facility] prior to arrival, resulted notable for hypokalemia 3.  Otherwise CMP within normal limits.  CK elevated at 361.  Lactic acid elevated at 3.4.  CBC within normal limits.  Tylenol, salicylate level undetectable.  Glucose normal.  COVID-negative.  UA not consistent with infectious etiology.  Urine tox positive for benzodiazepines.  Blood cultures obtained, as are urine cultures, and pending.  Imaging from [place] include CT head and chest x-ray.  CT head per radiology read with no acute intracranial abnormalities.  Chest x-ray with no radiographic evidence of acute cardiopulmonary disease per radiology.  EKG obtained, demonstrates sinus tachycardia, ventricular rate 124 bpm.  No evidence of abnormal intervals or acute ischemia.  Interpreted by myself and my attending.  Neurology contacted.  They recommend hospitalist admission for long-term monitoring and EEG.  Patient accepted for admission.  The plan for this patient was discussed with Dr. Lockie Mola, who voiced agreement and who oversaw evaluation and treatment of this patient.    Final Clinical Impression(s) / ED Diagnoses Final diagnoses:  Seizure-like activity [facility])    Rx / DC Orders ED Discharge Orders     None         Skeet Simmer,  MD 01/29/22 0911    Virgina Norfolk, DO 01/29/22 1001

## 2022-01-29 NOTE — ED Notes (Signed)
Called [facility] for possible transfer  

## 2022-01-29 NOTE — ED Notes (Signed)
Pt accepted to [facility] will call with report number

## 2022-01-30 LAB — URINE CULTURE: Culture: NO GROWTH

## 2022-01-30 LAB — TOPIRAMATE LEVEL: Topiramate Lvl: <1.5 ug/mL — ABNORMAL LOW (ref 2.0–25.0)

## 2022-01-30 LAB — LEVETIRACETAM LEVEL: Levetiracetam Lvl: <2 ug/mL — ABNORMAL LOW (ref 10.0–40.0)

## 2022-02-03 LAB — CULTURE, BLOOD (ROUTINE X 2)
Culture: NO GROWTH
Culture: NO GROWTH
Special Requests: ADEQUATE
Special Requests: ADEQUATE

## 2022-02-09 ENCOUNTER — Encounter: Payer: Self-pay | Admitting: Cardiology

## 2022-03-03 DIAGNOSIS — N809 Endometriosis, unspecified: Secondary | ICD-10-CM | POA: Diagnosis not present

## 2022-03-12 ENCOUNTER — Other Ambulatory Visit: Payer: Self-pay

## 2022-03-12 ENCOUNTER — Encounter (HOSPITAL_BASED_OUTPATIENT_CLINIC_OR_DEPARTMENT_OTHER): Payer: Self-pay | Admitting: Emergency Medicine

## 2022-03-12 ENCOUNTER — Emergency Department (HOSPITAL_BASED_OUTPATIENT_CLINIC_OR_DEPARTMENT_OTHER): Payer: BC Managed Care – PPO

## 2022-03-12 ENCOUNTER — Emergency Department (HOSPITAL_BASED_OUTPATIENT_CLINIC_OR_DEPARTMENT_OTHER)
Admission: EM | Admit: 2022-03-12 | Discharge: 2022-03-12 | Disposition: A | Discharge status: Home / Self Care | Payer: BC Managed Care – PPO | Attending: Student | Admitting: Student

## 2022-03-12 ENCOUNTER — Ambulatory Visit: Payer: Self-pay

## 2022-03-12 DIAGNOSIS — Z79899 Other long term (current) drug therapy: Secondary | ICD-10-CM | POA: Insufficient documentation

## 2022-03-12 DIAGNOSIS — I10 Essential (primary) hypertension: Secondary | ICD-10-CM | POA: Diagnosis not present

## 2022-03-12 DIAGNOSIS — Z7952 Long term (current) use of systemic steroids: Secondary | ICD-10-CM | POA: Insufficient documentation

## 2022-03-12 DIAGNOSIS — R109 Unspecified abdominal pain: Secondary | ICD-10-CM | POA: Diagnosis not present

## 2022-03-12 DIAGNOSIS — R1032 Left lower quadrant pain: Secondary | ICD-10-CM | POA: Diagnosis not present

## 2022-03-12 LAB — URINALYSIS, ROUTINE W REFLEX MICROSCOPIC
Bilirubin Urine: NEGATIVE
Glucose, UA: 500 mg/dL — AB
Hgb urine dipstick: NEGATIVE
Ketones, ur: NEGATIVE mg/dL
Leukocytes,Ua: NEGATIVE
Nitrite: NEGATIVE
Protein, ur: NEGATIVE mg/dL
Specific Gravity, Urine: 1.014 (ref 1.005–1.030)
pH: 7 (ref 5.0–8.0)

## 2022-03-12 LAB — CBC WITH DIFFERENTIAL/PLATELET
Abs Immature Granulocytes: 0.01 10*3/uL (ref 0.00–0.07)
Basophils Absolute: 0 10*3/uL (ref 0.0–0.1)
Basophils Relative: 0 %
Eosinophils Absolute: 0 10*3/uL (ref 0.0–0.5)
Eosinophils Relative: 1 %
HCT: 41.8 % (ref 36.0–46.0)
Hemoglobin: 14.5 g/dL (ref 12.0–15.0)
Immature Granulocytes: 0 %
Lymphocytes Relative: 40 %
Lymphs Abs: 2 10*3/uL (ref 0.7–4.0)
MCH: 32.7 pg (ref 26.0–34.0)
MCHC: 34.7 g/dL (ref 30.0–36.0)
MCV: 94.4 fL (ref 80.0–100.0)
Monocytes Absolute: 0.5 10*3/uL (ref 0.1–1.0)
Monocytes Relative: 9 %
Neutro Abs: 2.5 10*3/uL (ref 1.7–7.7)
Neutrophils Relative %: 50 %
Platelets: 248 10*3/uL (ref 150–400)
RBC: 4.43 MIL/uL (ref 3.87–5.11)
RDW: 12.1 % (ref 11.5–15.5)
WBC: 5 10*3/uL (ref 4.0–10.5)
nRBC: 0 % (ref 0.0–0.2)

## 2022-03-12 LAB — COMPREHENSIVE METABOLIC PANEL
ALT: 22 U/L (ref 0–44)
AST: 19 U/L (ref 15–41)
Albumin: 4.7 g/dL (ref 3.5–5.0)
Alkaline Phosphatase: 61 U/L (ref 38–126)
Anion gap: 9 (ref 5–15)
BUN: 6 mg/dL (ref 6–20)
CO2: 26 mmol/L (ref 22–32)
Calcium: 9.8 mg/dL (ref 8.9–10.3)
Chloride: 105 mmol/L (ref 98–111)
Creatinine, Ser: 0.69 mg/dL (ref 0.44–1.00)
GFR, Estimated: >60 mL/min (ref >60)
Glucose, Bld: 99 mg/dL (ref 70–99)
Potassium: 3.8 mmol/L (ref 3.5–5.1)
Sodium: 140 mmol/L (ref 135–145)
Total Bilirubin: 0.5 mg/dL (ref 0.3–1.2)
Total Protein: 7.1 g/dL (ref 6.5–8.1)

## 2022-03-12 LAB — PREGNANCY, URINE: Preg Test, Ur: NEGATIVE

## 2022-03-12 MED ORDER — HYDROMORPHONE HCL 1 MG/ML IJ SOLN
1.0000 mg | Freq: Once | INTRAMUSCULAR | Status: AC
  Administered 2022-03-12: 1 mg via INTRAVENOUS
  Filled 2022-03-12: qty 1

## 2022-03-12 MED ORDER — LACTATED RINGERS IV BOLUS
1000.0000 mL | Freq: Once | INTRAVENOUS | Status: AC
  Administered 2022-03-12: 1000 mL via INTRAVENOUS

## 2022-03-12 MED ORDER — ONDANSETRON HCL 4 MG/2ML IJ SOLN
4.0000 mg | Freq: Once | INTRAMUSCULAR | Status: AC
  Administered 2022-03-12: 4 mg via INTRAVENOUS
  Filled 2022-03-12: qty 2

## 2022-03-12 MED ORDER — MORPHINE SULFATE (PF) 4 MG/ML IV SOLN
4.0000 mg | Freq: Once | INTRAVENOUS | Status: AC
  Administered 2022-03-12: 4 mg via INTRAVENOUS
  Filled 2022-03-12: qty 1

## 2022-03-12 MED ORDER — HYDROCODONE-ACETAMINOPHEN 5-325 MG PO TABS: 1.0000 | ORAL_TABLET | Freq: Four times a day (QID) | ORAL | 0 refills | Status: DC | PRN

## 2022-03-12 MED ORDER — KETOROLAC TROMETHAMINE 15 MG/ML IJ SOLN
15.0000 mg | Freq: Once | INTRAMUSCULAR | Status: AC
  Administered 2022-03-12: 15 mg via INTRAVENOUS
  Filled 2022-03-12: qty 1

## 2022-03-12 MED ORDER — IOHEXOL 300 MG/ML  SOLN
100.0000 mL | Freq: Once | INTRAMUSCULAR | Status: AC | PRN
  Administered 2022-03-12: 80 mL via INTRAVENOUS

## 2022-03-12 MED ORDER — LIDOCAINE 5 % EX PTCH
1.0000 | MEDICATED_PATCH | CUTANEOUS | Status: DC
  Administered 2022-03-12: 1 via TRANSDERMAL
  Filled 2022-03-12: qty 1

## 2022-03-12 NOTE — ED Triage Notes (Signed)
Left lower back pain for 1 day and shoots down her leg.no urinary symptoms, no fever

## 2022-03-12 NOTE — ED Provider Notes (Signed)
[facility] EMERGENCY DEPT Provider Note  CSN: [id number] Arrival date & time: 03/12/22 0944  Chief Complaint(s) Back Pain  HPI Pamela Booker is a 26 y.o. female with PMH epilepsy, PVCs, endometriosis, nephrolithiasis who presents emergency department for evaluation of flank pain.  Patient states that she received a COVID booster yesterday and started to develop left flank pain.  She initially thought this may be myalgias secondary to her recent vaccination but her flank pain has progressively worsened and now radiates down to the back.  She is taking ibuprofen at home with no relief.  Denies nausea, vomiting, chest pain, shortness of breath or other systemic symptoms.  Denies vaginal bleeding or discharge.   Past Medical History Past Medical History:  Diagnosis Date   Anxiety    Encounter for loop recorder check 06/10/2019   Epilepsy ([place] [address])    Hypertension    Loop: [employer] loop recorder in situ 05/06/2019   Scheduled Remote loop recorder check 06/09/2019: This represents an unremarkable monitoring session. The presenting rhythm is not available. No symptomatic or automatic episodes were triggered. Daily activity trends are stable. Heart rate variability trends are stable.   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 Authorizing Provider  acebutolol (SECTRAL) 200 MG capsule  TAKE ONE CAPSULE BY MOUTH TWICE A DAY DISCONTINUE VERAPAMIL Patient taking differently: Take 200 mg by mouth 2 (two) times daily. TAKE ONE CAPSULE BY MOUTH TWICE A DAY DISCONTINUE VERAPAMIL 06/02/21   Cantwell, Celeste C, PA-C  buPROPion (WELLBUTRIN XL) 300 MG 24 hr tablet Take 300 mg by mouth every morning. 11/03/20   [provider]  butalbital-acetaminophen-caffeine (FIORICET) 50-325-40 MG tablet Take 1-2 tablets by mouth every 6 (six) hours as needed for headache. Patient not taking: Reported on 11/28/2021 12/04/20   Orpah Greek, MD  ciprofloxacin-dexamethasone (CIPRODEX) OTIC suspension Place 4 drops into both ears 2 (two) times daily. 10/05/21   Vanessa Kick, MD  diazepam (VALIUM) 5 MG tablet Take 5 mg by mouth 2 (two) times daily as needed for muscle spasms. 05/31/21   [provider]  escitalopram (LEXAPRO) 20 MG tablet Take 20 mg by mouth daily.  08/23/18   [provider]  estradiol (VIVELLE-DOT) 0.075 MG/24HR Place 1 patch onto the skin as needed (period bleeding). 03/02/21   [provider]  fluticasone (FLONASE) 50 MCG/ACT nasal spray Place 2 sprays into both nostrils daily. 09/02/21   Melynda Ripple, MD  HYDROcodone-acetaminophen (NORCO/VICODIN) 5-325 MG tablet Take 1 to 2 tablets by mouth every 6 (six) hours as needed. 08/01/21   Loni Beckwith, PA-C  lamoTRIgine (LAMICTAL) 25 MG tablet PLEASE SEE ATTACHED FOR DETAILED DIRECTIONS 09/22/21   [provider]  levETIRAcetam (KEPPRA) 500 MG tablet Take 1 tablet (500 mg total) by mouth 2 (two) times daily. 05/06/20   Carmin Muskrat, MD  NAYZILAM 5 MG/0.1ML SOLN Place into both nostrils daily. 08/19/21   [provider]  norethindrone (AYGESTIN) 5 MG tablet Take 5 mg by mouth daily. 02/26/19 07/05/21  [provider]  ondansetron (ZOFRAN) 4 MG tablet Take 4 mg by mouth 3 (three) times daily as needed for nausea/vomiting. 01/22/22   [provider]  oxyCODONE  (ROXICODONE) 5 MG immediate release tablet Take 1 tablet (5 mg total) by mouth every 8 (eight) hours as needed for up to 6 doses for breakthrough pain. 09/15/21   Curatolo, Adam, DO  phentermine 30 MG capsule Take 30 mg by mouth every morning. 01/26/22   [provider]  predniSONE (DELTASONE) 20 MG tablet Take 2 tablets (40 mg total) by mouth daily. 01/25/22   Hans Eden, NP  PRESCRIPTION MEDICATION Apply 1 application topically every other day. Apply to face at night - Hers 20 ml Acne ( pump) Az/Zp (0.0125%)    [provider]  rizatriptan (MAXALT-MLT) 10 MG disintegrating tablet Take 10 mg by mouth daily as needed for migraine.  03/17/19   [provider]  sertraline (ZOLOFT) 100 MG tablet Take 100 mg by mouth daily. 09/13/21   [provider]  tiZANidine (ZANAFLEX) 4 MG tablet Take 4-8 mg by mouth as needed for muscle spasms. 06/08/21   [provider]  topiramate (TOPAMAX) 100 MG tablet Take 100 mg by mouth daily.    [provider]  valACYclovir (VALTREX) 500 MG tablet Take 500 mg by mouth daily.    [provider]  XCOPRI 14 x 150 MG & 14 x200 MG TBPK Take 200 mg by mouth at bedtime. 11/11/20   [provider]  zolpidem (AMBIEN) 10 MG tablet Take 10 mg by mouth at bedtime. 01/26/22   [provider]  omeprazole (PRILOSEC) 20 MG capsule Take 1 capsule (20 mg total) by mouth daily for 14 days. Patient not taking: Reported on 05/06/2020 11/03/19 10/02/20  Darr, Edison Nasuti, PA-C                                                                                                                                    Past Surgical History Past Surgical History:  Procedure Laterality Date   LAPAROSCOPY     TONSILLECTOMY     VAGUS NERVE STIMULATOR INSERTION     Family History Family History  Adopted: Yes  Problem Relation Age of Onset   Healthy Daughter     Social History Social History   Tobacco Use   Smoking status: Never    Smokeless tobacco: Never  Vaping Use   Vaping Use: Never used  Substance Use Topics   Alcohol use: Yes    Comment: socially   Drug use: No   Allergies Nsaids, Benadryl [diphenhydramine], Compazine [prochlorperazine], Ibuprofen, and Emgality [galcanezumab-gnlm]  Review of Systems Review of Systems  Genitourinary:  Positive for flank pain.  Musculoskeletal:  Positive for back pain.    Physical Exam Vital Signs  I have reviewed the triage vital signs BP  138/77   Pulse 80   Temp 98.7 F (37.1 C) (Oral)   Resp 20   SpO2 100%   Physical Exam Vitals and nursing note reviewed.  Constitutional:      General: She is not in acute distress.    Appearance: She is well-developed. She is ill-appearing.  HENT:     Head: Normocephalic and atraumatic.  Eyes:     Conjunctiva/sclera: Conjunctivae normal.  Cardiovascular:     Rate and Rhythm: Normal rate and regular rhythm.     Heart sounds: No murmur heard. Pulmonary:     Effort: Pulmonary effort is normal. No respiratory distress.     Breath sounds: Normal breath sounds.  Abdominal:     Palpations: Abdomen is soft.     Tenderness: There is no abdominal tenderness. There is left CVA tenderness.  Musculoskeletal:        General: No swelling.     Cervical back: Neck supple.  Skin:    General: Skin is warm and dry.     Capillary Refill: Capillary refill takes less than 2 seconds.  Neurological:     Mental Status: She is alert.  Psychiatric:        Mood and Affect: Mood normal.     ED Results and Treatments Labs (all labs ordered are listed, but only abnormal results are displayed) Labs Reviewed  URINALYSIS, ROUTINE W REFLEX MICROSCOPIC - Abnormal; Notable for the following components:      Result Value   Glucose, UA 500 (*)    All other components within normal limits                                                                                                                          Radiology No results  found.  Pertinent labs & imaging results that were available during my care of the patient were reviewed by me and considered in my medical decision making (see MDM for details).  Medications Ordered in ED Medications - No data to display                                                                                                                                   Procedures Procedures  (including critical care time)  Medical Decision Making / ED Course   This patient presents to the ED for concern of flank pain, this involves an extensive number of treatment  options, and is a complaint that carries with it a high risk of complications and morbidity.  The differential diagnosis includes pyelonephritis, nephrolithiasis, endometriosis, musculoskeletal strain  MDM: Patient seen the emergency room for evaluation of back and flank pain.  Physical exam with significant tenderness in the left flank but is otherwise unremarkable.  Laboratory evaluation unremarkable.  Pregnancy negative.  Urinalysis unremarkable.  CT abdomen pelvis with punctate stones in the kidney but no ureteral stone or explanation for the patient's back pain.  Patient was given pain medication and on reevaluation her symptoms have minimally improved but are starting to return.  She was then given additional pain medications which ultimately got her pain under control.  Given that she has required multiple surgical interventions for endometriosis, suspect possible element of endometriosis causing her flank pain today and patient will need to follow-up closely with her OB/GYN as she has recently removed her NuvaRing that was designed to keep this under control.  She was given return precautions of which she voiced understanding and discharged   Additional history obtained: -Additional history obtained from husband -External records from outside source obtained and reviewed including: Chart review including previous notes, labs,  imaging, consultation notes   Lab Tests: -I ordered, reviewed, and interpreted labs.   The pertinent results include:   Labs Reviewed  URINALYSIS, ROUTINE W REFLEX MICROSCOPIC - Abnormal; Notable for the following components:      Result Value   Glucose, UA 500 (*)    All other components within normal limits      Imaging Studies ordered: I ordered imaging studies including CTAP I independently visualized and interpreted imaging. I agree with the radiologist interpretation   Medicines ordered and prescription drug management: No orders of the defined types were placed in this encounter.   -I have reviewed the patients home medicines and have made adjustments as needed  Critical interventions none  Cardiac Monitoring: The patient was maintained on a cardiac monitor.  I personally viewed and interpreted the cardiac monitored which showed an underlying rhythm of: NSR  Social Determinants of Health:  Factors impacting patients care include: none   Reevaluation: After the interventions noted above, I reevaluated the patient and found that they have :improved  Co morbidities that complicate the patient evaluation  Past Medical History:  Diagnosis Date   Anxiety    Encounter for loop recorder check 06/10/2019   Epilepsy ([place])    Hypertension    Loop: [employer] loop recorder in situ 05/06/2019   Scheduled Remote loop recorder check 06/09/2019: This represents an unremarkable monitoring session. The presenting rhythm is not available. No symptomatic or automatic episodes were triggered. Daily activity trends are stable. Heart rate variability trends are stable.   Migraines    Palpitations    PVC (premature ventricular contraction)    PVC's (premature ventricular contractions)    Seizures (HCC)    Syncope and collapse 06/10/2019      Dispostion: I considered admission for this patient, but she currently does not meet inpatient criteria for admission and she is safe for  discharge with outpatient follow-up     Final Clinical Impression(s) / ED Diagnoses Final diagnoses:  None     @PCDICTATION @    Teressa Lower, MD 03/12/22 [phone]

## 2022-03-12 NOTE — ED Notes (Signed)
Discharge instructions, follow up care, and prescriptions reviewed and explained, pt verbalized understanding. Pt caox4 and ambulatory on d/c.  

## 2022-03-18 ENCOUNTER — Ambulatory Visit
Admission: EM | Admit: 2022-03-18 | Discharge: 2022-03-18 | Disposition: A | Discharge status: Home / Self Care | Payer: BC Managed Care – PPO

## 2022-03-18 DIAGNOSIS — R399 Unspecified symptoms and signs involving the genitourinary system: Secondary | ICD-10-CM

## 2022-03-18 LAB — POCT URINALYSIS DIP (MANUAL ENTRY)
Bilirubin, UA: NEGATIVE
Glucose, UA: 100 mg/dL — AB
Ketones, POC UA: NEGATIVE mg/dL
Leukocytes, UA: NEGATIVE
Nitrite, UA: NEGATIVE
Spec Grav, UA: >=1.03 — AB (ref 1.010–1.025)
Urobilinogen, UA: 0.2 E.U./dL
pH, UA: 5.5 (ref 5.0–8.0)

## 2022-03-18 NOTE — Discharge Instructions (Addendum)
Follow up here or with your primary care provider if your symptoms are worsening or not improving.  If you are unable to manage pain associated with passing the kidney stone, please follow-up in the ED.

## 2022-03-18 NOTE — ED Provider Notes (Signed)
[facility]    CSN: [id number] Arrival date & time: 03/18/22  1408      History   Chief Complaint Chief Complaint  Patient presents with   Urinary Frequency   Dysuria    HPI Pamela Booker is a 26 y.o. female.    Urinary Frequency  Dysuria   Dents to UC with complaint of urinary frequency, pain with urination starting today.  Chart indicates history of renal calculi.  She was seen in the ED on 10/8 for complaint of flank pain.  Imaging showed bilateral nephrolithiasis with no evidence of ureteral calculi.  Patient states she has passed stones before and generally does not feel it until it is in her urinary tract.  Past Medical History:  Diagnosis Date   Anxiety    Encounter for loop recorder check 06/10/2019   Epilepsy (HCC)    Hypertension    Loop: Biotronic loop recorder in situ 05/06/2019   Scheduled Remote loop recorder check 06/09/2019: This represents an unremarkable monitoring session. The presenting rhythm is not available. No symptomatic or automatic episodes were triggered. Daily activity trends are stable. Heart rate variability trends are stable.   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   PVC (premature ventricular contraction) 03/11/2018   Endometriosis 03/14/2017   Atypical chest pain 04/25/2016   Palpitations 04/25/2016   Shortness of breath 04/25/2016   Chronic migraine without aura without status migrainosus, not intractable 12/11/2014     Past Surgical History:  Procedure Laterality Date   LAPAROSCOPY     TONSILLECTOMY     VAGUS NERVE STIMULATOR INSERTION      OB History     Gravida  1   Para  0   Term  0   Preterm  0   AB  0   Living         SAB  0   IAB  0   Ectopic  0   Multiple      Live Births               Home Medications    Prior to Admission medications   Medication Sig Start Date End Date Taking? Authorizing Provider  diazepam (VALIUM) 5 MG tablet Take by mouth. 05/31/21  Yes [provider]  acebutolol (SECTRAL) 200 MG capsule TAKE ONE CAPSULE BY MOUTH TWICE A DAY DISCONTINUE VERAPAMIL Patient taking differently: Take 200 mg by mouth 2 (two) times daily. TAKE ONE CAPSULE BY MOUTH TWICE A DAY DISCONTINUE VERAPAMIL 06/02/21   Cantwell, Celeste C, PA-C  buPROPion (WELLBUTRIN XL) 300 MG 24 hr tablet Take 300 mg by mouth every morning. 11/03/20   [provider]  butalbital-acetaminophen-caffeine (FIORICET) 50-325-40 MG tablet Take 1-2 tablets by mouth every 6 (six) hours as needed for headache. Patient not taking: Reported on 11/28/2021 12/04/20   Gilda Crease, MD  ciprofloxacin-dexamethasone (CIPRODEX) OTIC suspension Place 4 drops into both ears 2 (two) times daily. 10/05/21   Mardella Layman, MD  diazepam (VALIUM) 5  MG tablet Take 5 mg by mouth 2 (two) times daily as needed for muscle spasms. 05/31/21   [provider]  escitalopram (LEXAPRO) 20 MG tablet Take 20 mg by mouth daily.  08/23/18   [provider]  estradiol (VIVELLE-DOT) 0.075 MG/24HR Place 1 patch onto the skin as needed (period bleeding). 03/02/21   [provider]  fluticasone (FLONASE) 50 MCG/ACT nasal spray Place 2 sprays into both nostrils daily. 09/02/21   Domenick Gong, MD  HYDROcodone-acetaminophen (NORCO/VICODIN) 5-325 MG tablet Take 1 to 2 tablets by mouth every 6 (six) hours as needed. 08/01/21   Haskel Schroeder, PA-C  HYDROcodone-acetaminophen  (NORCO/VICODIN) 5-325 MG tablet Take 1-2 tablets by mouth every 6 (six) hours as needed. 03/12/22   Kommor, Madison, MD  lamoTRIgine (LAMICTAL) 25 MG tablet PLEASE SEE ATTACHED FOR DETAILED DIRECTIONS 09/22/21   [provider]  levETIRAcetam (KEPPRA) 500 MG tablet Take 1 tablet (500 mg total) by mouth 2 (two) times daily. 05/06/20   Gerhard Munch, MD  LORazepam (ATIVAN) 1 MG tablet Take 1 mg by mouth every 6 (six) hours as needed. 03/03/22   [provider]  NAYZILAM 5 MG/0.1ML SOLN Place into both nostrils daily. 08/19/21   [provider]  nitrofurantoin, macrocrystal-monohydrate, (MACROBID) 100 MG capsule Take 100 mg by mouth 2 (two) times daily. 02/05/22   [provider]  norethindrone (AYGESTIN) 5 MG tablet Take 5 mg by mouth daily. 02/26/19 07/05/21  [provider]  NUVARING 0.12-0.015 MG/24HR vaginal ring SMARTSIG:ring Vaginal Every 3 Weeks 03/07/22   [provider]  ondansetron (ZOFRAN) 4 MG tablet Take 4 mg by mouth 3 (three) times daily as needed for nausea/vomiting. 01/22/22   [provider]  oxyCODONE (ROXICODONE) 5 MG immediate release tablet Take 1 tablet (5 mg total) by mouth every 8 (eight) hours as needed for up to 6 doses for breakthrough pain. 09/15/21   Curatolo, Adam, DO  phentermine 30 MG capsule Take 30 mg by mouth every morning. 01/26/22   [provider]  predniSONE (DELTASONE) 20 MG tablet Take 2 tablets (40 mg total) by mouth daily. 01/25/22   Valinda Hoar, NP  PRESCRIPTION MEDICATION Apply 1 application topically every other day. Apply to face at night - Hers 20 ml Acne ( pump) Az/Zp (0.0125%)    [provider]  rizatriptan (MAXALT-MLT) 10 MG disintegrating tablet Take 10 mg by mouth daily as needed for migraine.  03/17/19   [provider]  sertraline (ZOLOFT) 100 MG tablet Take 100 mg by mouth daily. 09/13/21   [provider]  tiZANidine (ZANAFLEX) 4 MG tablet Take 4-8 mg by  mouth as needed for muscle spasms. 06/08/21   [provider]  topiramate (TOPAMAX) 100 MG tablet Take 100 mg by mouth daily.    [provider]  valACYclovir (VALTREX) 500 MG tablet Take 500 mg by mouth daily.    [provider]  XCOPRI 14 x 150 MG & 14 x200 MG TBPK Take 200 mg by mouth at bedtime. 11/11/20   [provider]  zolpidem (AMBIEN) 10 MG tablet Take 10 mg by mouth at bedtime. 01/26/22   [provider]  omeprazole (PRILOSEC) 20 MG capsule Take 1 capsule (20 mg total) by mouth daily for 14 days. Patient not taking: Reported on 05/06/2020 11/03/19 10/02/20  Darr, Gerilyn Pilgrim, PA-C    Family History Family History  Adopted: Yes  Problem Relation Age of Onset   Healthy Daughter     Social  History Social History   Tobacco Use   Smoking status: Never   Smokeless tobacco: Never  Vaping Use   Vaping Use: Never used  Substance Use Topics   Alcohol use: Yes    Comment: socially   Drug use: No     Allergies   Nsaids, Benadryl [diphenhydramine], Compazine [prochlorperazine], Ibuprofen, and Emgality [galcanezumab-gnlm]   Review of Systems Review of Systems  Genitourinary:  Positive for dysuria and frequency.     Physical Exam Triage Vital Signs ED Triage Vitals [03/18/22 1430]  Enc Vitals Group     BP 111/77     Pulse Rate 90     Resp 16     Temp 98.1 F (36.7 C)     Temp src      SpO2 97 %     Weight      Height      Head Circumference      Peak Flow      Pain Score 3     Pain Loc      Pain Edu?      Excl. in [place]?    No data found.  Updated Vital Signs BP 111/77   Pulse 90   Temp 98.1 F (36.7 C)   Resp 16   LMP 03/15/2022 (Exact Date)   SpO2 97%   Breastfeeding No   Visual Acuity Right Eye Distance:   Left Eye Distance:   Bilateral Distance:    Right Eye Near:   Left Eye Near:    Bilateral Near:     Physical Exam Vitals reviewed.  Constitutional:      Appearance: Normal appearance.  Skin:     General: Skin is warm and dry.  Neurological:     General: No focal deficit present.     Mental Status: She is alert and oriented to person, place, and time.  Psychiatric:        Mood and Affect: Mood normal.        Behavior: Behavior normal.      UC Treatments / Results  Labs (all labs ordered are listed, but only abnormal results are displayed) Labs Reviewed  POCT URINALYSIS DIP (MANUAL ENTRY) - Abnormal; Notable for the following components:      Result Value   Glucose, UA =100 (*)    Spec Grav, UA >=1.030 (*)    Blood, UA large (*)    Protein Ur, POC trace (*)    All other components within normal limits    EKG   Radiology No results found.  Procedures Procedures (including critical care time)  Medications Ordered in UC Medications - No data to display  Initial Impression / Assessment and Plan / UC Course  I have reviewed the triage vital signs and the nursing notes.  Pertinent labs & imaging results that were available during my care of the patient were reviewed by me and considered in my medical decision making (see chart for details).   UA is negative for leuks and nitrites.  Positive for large blood.  I suspect she is or has passed a renal calculi.  Asked her to follow-up in the ED again if she is unable to manage pain.   Final Clinical Impressions(s) / UC Diagnoses   Final diagnoses:  None   Discharge Instructions   None    ED Prescriptions   None    PDMP not reviewed this encounter.   Rose Phi, [place] 03/18/22 1451

## 2022-03-18 NOTE — ED Triage Notes (Addendum)
Pt. Presents to UC w/ c/o Urinary frequency, dysuria and only peeing a small amount that started today.

## 2022-03-23 DIAGNOSIS — F329 Major depressive disorder, single episode, unspecified: Secondary | ICD-10-CM | POA: Diagnosis not present

## 2022-03-23 DIAGNOSIS — G40209 Localization-related (focal) (partial) symptomatic epilepsy and epileptic syndromes with complex partial seizures, not intractable, without status epilepticus: Secondary | ICD-10-CM | POA: Diagnosis not present

## 2022-03-23 DIAGNOSIS — F5101 Primary insomnia: Secondary | ICD-10-CM | POA: Diagnosis not present

## 2022-03-23 DIAGNOSIS — G43909 Migraine, unspecified, not intractable, without status migrainosus: Secondary | ICD-10-CM | POA: Diagnosis not present

## 2022-04-04 DIAGNOSIS — R87612 Low grade squamous intraepithelial lesion on cytologic smear of cervix (LGSIL): Secondary | ICD-10-CM | POA: Insufficient documentation

## 2022-04-04 DIAGNOSIS — Z9889 Other specified postprocedural states: Secondary | ICD-10-CM | POA: Diagnosis not present

## 2022-04-04 DIAGNOSIS — Z3202 Encounter for pregnancy test, result negative: Secondary | ICD-10-CM | POA: Diagnosis not present

## 2022-04-04 DIAGNOSIS — N809 Endometriosis, unspecified: Secondary | ICD-10-CM | POA: Diagnosis not present

## 2022-04-04 HISTORY — DX: Low grade squamous intraepithelial lesion on cytologic smear of cervix (LGSIL): R87.612

## 2022-04-12 ENCOUNTER — Ambulatory Visit
Admission: EM | Admit: 2022-04-12 | Discharge: 2022-04-12 | Disposition: A | Discharge status: Home / Self Care | Payer: BC Managed Care – PPO | Attending: Emergency Medicine | Admitting: Emergency Medicine

## 2022-04-12 DIAGNOSIS — J029 Acute pharyngitis, unspecified: Secondary | ICD-10-CM

## 2022-04-12 DIAGNOSIS — J028 Acute pharyngitis due to other specified organisms: Secondary | ICD-10-CM | POA: Diagnosis not present

## 2022-04-12 DIAGNOSIS — B349 Viral infection, unspecified: Secondary | ICD-10-CM | POA: Diagnosis not present

## 2022-04-12 DIAGNOSIS — B9789 Other viral agents as the cause of diseases classified elsewhere: Secondary | ICD-10-CM | POA: Diagnosis not present

## 2022-04-12 DIAGNOSIS — Z20822 Contact with and (suspected) exposure to covid-19: Secondary | ICD-10-CM | POA: Diagnosis not present

## 2022-04-12 LAB — RESP PANEL BY RT-PCR (FLU A&B, COVID) ARPGX2
Influenza A by PCR: NEGATIVE
Influenza B by PCR: NEGATIVE
SARS Coronavirus 2 by RT PCR: NEGATIVE

## 2022-04-12 LAB — POCT RAPID STREP A (OFFICE): Rapid Strep A Screen: NEGATIVE

## 2022-04-12 MED ORDER — LIDOCAINE VISCOUS HCL 2 % MT SOLN: 15.0000 mL | OROMUCOSAL | 0 refills | Status: DC | PRN

## 2022-04-12 NOTE — ED Triage Notes (Signed)
Patient to Urgent Care with complaints of sore throat that started last night.  Headache x3 days.  Fever today of 101, took tylenol around 3pm.

## 2022-04-12 NOTE — Discharge Instructions (Addendum)
Your strep test is negative.  Your COVID and Flu test is pending.  Take Tylenol as needed for fever or discomfort.  Use the viscous lidocaine as directed.  Rest and keep yourself hydrated.    Follow-up with your primary care provider if your symptoms are not improving.

## 2022-04-12 NOTE — ED Provider Notes (Signed)
Pamela Booker    CSN: [id number] Arrival date & time: 04/12/22  1621      History   Chief Complaint Chief Complaint  Patient presents with   Sore Throat   Headache    HPI Pamela Booker is a 26 y.o. female.  Patient presents with sore throat and fever x1 day.  She has had headache for 3 days.  Tmax 101.  Treatment at home with Tylenol.  No rash, cough, shortness of breath, vomiting, diarrhea, or other symptoms.  The history is provided by the patient and medical records.    Past Medical History:  Diagnosis Date   Anxiety    Encounter for loop recorder check 06/10/2019   Epilepsy (HCC)    Hypertension    Loop: Biotronic loop recorder in situ 05/06/2019   Scheduled Remote loop recorder check 06/09/2019: This represents an unremarkable monitoring session. The presenting rhythm is not available. No symptomatic or automatic episodes were triggered. Daily activity trends are stable. Heart rate variability trends are stable.   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   PVC (premature ventricular contraction) 03/11/2018   Endometriosis 03/14/2017   Atypical chest pain 04/25/2016   Palpitations 04/25/2016   Shortness of breath 04/25/2016   Chronic migraine without aura without status migrainosus, not intractable 12/11/2014    Past Surgical History:  Procedure Laterality Date   LAPAROSCOPY     TONSILLECTOMY     VAGUS NERVE STIMULATOR INSERTION      OB History      Gravida  1   Para  0   Term  0   Preterm  0   AB  0   Living         SAB  0   IAB  0   Ectopic  0   Multiple      Live Births               Home Medications    Prior to Admission medications   Medication Sig Start Date End Date Taking? Authorizing Provider  acebutolol (SECTRAL) 200 MG capsule TAKE ONE CAPSULE BY MOUTH TWICE A DAY DISCONTINUE VERAPAMIL Patient taking differently: Take 200 mg by mouth 2 (two) times daily. TAKE ONE CAPSULE BY MOUTH TWICE A DAY DISCONTINUE VERAPAMIL 06/02/21   Cantwell, Celeste C, PA-C  buPROPion (WELLBUTRIN XL) 300 MG 24 hr tablet Take 300 mg by mouth every morning. 11/03/20   [provider]  butalbital-acetaminophen-caffeine (FIORICET) 50-325-40 MG tablet Take 1-2 tablets by mouth every 6 (six) hours as needed for headache. Patient not taking: Reported on 11/28/2021 12/04/20   Gilda Crease, MD  ciprofloxacin-dexamethasone (CIPRODEX) OTIC suspension Place 4 drops into both ears 2 (two) times daily. 10/05/21   Mardella Layman, MD  diazepam (VALIUM) 5 MG tablet Take 5 mg by mouth 2 (two) times daily as needed for muscle spasms. 05/31/21   [provider]  diazepam (VALIUM) 5 MG tablet Take by mouth. 05/31/21   [provider]  escitalopram (LEXAPRO) 20 MG tablet Take 20 mg by mouth daily.  08/23/18   [provider]  estradiol (VIVELLE-DOT) 0.075 MG/24HR Place 1 patch onto the skin as needed (period bleeding). 03/02/21   [provider]  fluticasone (FLONASE) 50 MCG/ACT nasal spray Place 2 sprays into both nostrils daily. 09/02/21   Domenick Gong, MD  HYDROcodone-acetaminophen (NORCO/VICODIN) 5-325 MG tablet Take 1 to 2 tablets by mouth every 6 (six) hours as needed. 08/01/21   Haskel Schroeder, PA-C  HYDROcodone-acetaminophen (NORCO/VICODIN) 5-325 MG tablet Take 1-2 tablets by mouth every 6 (six) hours as needed. 03/12/22   Kommor, [place], MD  lamoTRIgine (LAMICTAL) 25 MG tablet PLEASE SEE  ATTACHED FOR DETAILED DIRECTIONS 09/22/21   [provider]  levETIRAcetam (KEPPRA) 500 MG tablet Take 1 tablet (500 mg total) by mouth 2 (two) times daily. 05/06/20   Gerhard Munch, MD  lidocaine (XYLOCAINE) 2 % solution Use as directed 15 mLs in the mouth or throat as needed for mouth pain. 04/12/22  Yes Mickie Bail, NP  LORazepam (ATIVAN) 1 MG tablet Take 1 mg by mouth every 6 (six) hours as needed. 03/03/22   [provider]  NAYZILAM 5 MG/0.1ML SOLN Place into both nostrils daily. 08/19/21   [provider]  nitrofurantoin, macrocrystal-monohydrate, (MACROBID) 100 MG capsule Take 100 mg by mouth 2 (two) times daily. 02/05/22   [provider]  norethindrone (AYGESTIN) 5 MG tablet Take 5 mg by mouth daily. 02/26/19 07/05/21  [provider]  NUVARING 0.12-0.015 MG/24HR vaginal ring SMARTSIG:ring Vaginal Every 3 Weeks 03/07/22   [provider]  ondansetron (ZOFRAN) 4 MG tablet Take 4 mg by mouth 3 (three) times daily as needed for nausea/vomiting. 01/22/22   [provider]  oxyCODONE (ROXICODONE) 5 MG immediate release tablet Take 1 tablet (5 mg total) by mouth every 8 (eight) hours as needed for up to 6 doses for breakthrough pain. 09/15/21   Curatolo, Adam, DO  phentermine 30 MG capsule Take 30 mg by mouth every morning. 01/26/22   [provider]  predniSONE (DELTASONE) 20 MG tablet Take 2 tablets (40 mg total) by mouth daily. 01/25/22   Valinda Hoar, NP  PRESCRIPTION MEDICATION Apply 1 application topically every other day. Apply to face at night - Hers 20 ml Acne ( pump) Az/Zp (0.0125%)    [provider]  rizatriptan (MAXALT-MLT) 10 MG disintegrating tablet Take 10 mg by mouth daily as needed for migraine.  03/17/19   [provider]  sertraline (ZOLOFT) 100 MG tablet Take 100 mg by mouth daily. 09/13/21   [provider]  tiZANidine (ZANAFLEX) 4 MG tablet Take 4-8 mg by mouth as needed for muscle  spasms. 06/08/21   [provider]  topiramate (TOPAMAX) 100 MG tablet Take 100 mg by mouth daily.    [provider]  valACYclovir (VALTREX) 500 MG tablet Take 500 mg by mouth daily.    [provider]  XCOPRI 14 x 150 MG & 14 x200 MG TBPK Take 200 mg by mouth at bedtime. 11/11/20   [provider]  zolpidem (AMBIEN) 10 MG tablet Take 10 mg by mouth at bedtime. 01/26/22   [provider]  omeprazole (PRILOSEC) 20 MG capsule Take 1 capsule (20 mg total) by mouth daily for 14 days. Patient not taking: Reported on 05/06/2020 11/03/19 10/02/20  Darr, Gerilyn Pilgrim, PA-C    Family History Family History  Adopted: Yes  Problem Relation Age of Onset   Healthy Daughter  Social History Social History   Tobacco Use   Smoking status: Never   Smokeless tobacco: Never  Vaping Use   Vaping Use: Never used  Substance Use Topics   Alcohol use: Yes    Comment: socially   Drug use: No     Allergies   Nsaids, Benadryl [diphenhydramine], Compazine [prochlorperazine], Ibuprofen, and Emgality [galcanezumab-gnlm]   Review of Systems Review of Systems  Constitutional:  Positive for fever. Negative for chills.  HENT:  Positive for sore throat. Negative for ear pain.   Respiratory:  Negative for cough and shortness of breath.   Gastrointestinal:  Negative for diarrhea and vomiting.  Skin:  Negative for rash.  Neurological:  Positive for headaches.  All other systems reviewed and are negative.    Physical Exam Triage Vital Signs ED Triage Vitals [04/12/22 1632]  Enc Vitals Group     BP      Pulse Rate (!) 115     Resp 18     Temp 98.2 F (36.8 C)     Temp src      SpO2 98 %     Weight      Height      Head Circumference      Peak Flow      Pain Score      Pain Loc      Pain Edu?      Excl. in [place]?    No data found.  Updated Vital Signs BP 111/79   Pulse (!) 115   Temp 98.2 F (36.8 C)   Resp 18   Ht 5\' 2"  (1.575 m)   LMP 03/15/2022  (Exact Date)   SpO2 98%   BMI 26.52 kg/m   Visual Acuity Right Eye Distance:   Left Eye Distance:   Bilateral Distance:    Right Eye Near:   Left Eye Near:    Bilateral Near:     Physical Exam Vitals and nursing note reviewed.  Constitutional:      General: She is not in acute distress.    Appearance: She is well-developed. She is not ill-appearing.  HENT:     Right Ear: Tympanic membrane normal.     Left Ear: Tympanic membrane normal.     Nose: Nose normal.     Mouth/Throat:     Mouth: Mucous membranes are moist.     Pharynx: Posterior oropharyngeal erythema present.  Cardiovascular:     Rate and Rhythm: Normal rate and regular rhythm.     Heart sounds: Normal heart sounds.  Pulmonary:     Effort: Pulmonary effort is normal. No respiratory distress.     Breath sounds: Normal breath sounds.  Musculoskeletal:     Cervical back: Neck supple.  Skin:    General: Skin is warm and dry.  Neurological:     Mental Status: She is alert.  Psychiatric:        Mood and Affect: Mood normal.        Behavior: Behavior normal.      UC Treatments / Results  Labs (all labs ordered are listed, but only abnormal results are displayed) Labs Reviewed  RESP PANEL BY RT-PCR (FLU A&B, COVID) [id number]  POCT RAPID STREP A (OFFICE)    EKG   Radiology No results found.  Procedures Procedures (including critical care time)  Medications Ordered in UC Medications - No data to display  Initial Impression / Assessment and Plan / UC Course  I have reviewed the triage vital signs  and the nursing notes.  Pertinent labs & imaging results that were available during my care of the patient were reviewed by me and considered in my medical decision making (see chart for details).    Viral pharyngitis, viral illness.  Rapid strep negative.  COVID and Flu pending. Treating sore throat with viscous lidocaine.  Discussed symptomatic treatment including Tylenol, rest, hydration.  Instructed  patient to follow up with PCP if symptoms are not improving.  She agrees to plan of care.   Final Clinical Impressions(s) / UC Diagnoses   Final diagnoses:  Viral pharyngitis  Viral illness     Discharge Instructions      Your strep test is negative.  Your COVID and Flu test is pending.  Take Tylenol as needed for fever or discomfort.  Use the viscous lidocaine as directed.  Rest and keep yourself hydrated.    Follow-up with your primary care provider if your symptoms are not improving.         ED Prescriptions     Medication Sig Dispense Auth. Provider   lidocaine (XYLOCAINE) 2 % solution Use as directed 15 mLs in the mouth or throat as needed for mouth pain. 100 mL Mickie Bail, NP      PDMP not reviewed this encounter.   Mickie Bail, NP 04/12/22 1655

## 2022-04-14 ENCOUNTER — Ambulatory Visit
Admission: EM | Admit: 2022-04-14 | Discharge: 2022-04-14 | Disposition: A | Discharge status: Home / Self Care | Payer: BC Managed Care – PPO | Attending: Emergency Medicine | Admitting: Emergency Medicine

## 2022-04-14 ENCOUNTER — Ambulatory Visit (INDEPENDENT_AMBULATORY_CARE_PROVIDER_SITE_OTHER): Payer: BC Managed Care – PPO

## 2022-04-14 DIAGNOSIS — J029 Acute pharyngitis, unspecified: Secondary | ICD-10-CM

## 2022-04-14 DIAGNOSIS — R519 Headache, unspecified: Secondary | ICD-10-CM | POA: Diagnosis not present

## 2022-04-14 DIAGNOSIS — J040 Acute laryngitis: Secondary | ICD-10-CM

## 2022-04-14 DIAGNOSIS — R490 Dysphonia: Secondary | ICD-10-CM | POA: Diagnosis not present

## 2022-04-14 MED ORDER — ALUM & MAG HYDROXIDE-SIMETH 200-200-20 MG/5ML PO SUSP
10.0000 mL | Freq: Once | ORAL | Status: AC
  Administered 2022-04-14: 10 mL via ORAL

## 2022-04-14 MED ORDER — DIPHENHYDRAMINE HCL 12.5 MG/5ML PO ELIX
12.5000 mg | ORAL_SOLUTION | Freq: Once | ORAL | Status: AC
  Administered 2022-04-14: 12.5 mg via ORAL

## 2022-04-14 MED ORDER — PREDNISONE 20 MG PO TABS: 40.0000 mg | ORAL_TABLET | Freq: Every day | ORAL | 0 refills | Status: AC

## 2022-04-14 NOTE — ED Provider Notes (Signed)
HPI  SUBJECTIVE:  Pamela Booker is a 26 y.o. female who presents with persistent headache, sore throat, fevers Tmax 101 and laryngitis starting last night.  She reports decreased appetite, but states that she is unable to eat secondary to the sore throat.  She reports pain with swallowing, but denies sensation of throat swelling shut, difficulty breathing, drooling, trismus, neck stiffness.  No nasal congestion, rhinorrhea, postnasal drip, sinus pain or pressure.  Her headache is intermittent, lasting hours, accompanied with some photophobia and nausea.  No photophobia, vomiting, rash.  She has been taking Tylenol, viscous lidocaine, over-the-counter cold medication, ibuprofen.  The viscous lidocaine helps with the sore throat.  Symptoms worse with talking, swallowing.  Patient was seen here 2 days ago for throat and headache.  Rapid strep, COVID and flu were negative.  She was thought to have a viral pharyngitis, and was sent home with viscous lidocaine, Tylenol, rest, hydration.  She has a past medical history of seizures, PVCs, hypertension, GI ulcers.  LMP: Last week.  Denies possibility being pregnant.  PCP: None.  Past Medical History:  Diagnosis Date   Anxiety    Encounter for loop recorder check 06/10/2019   Epilepsy (HCC)    Hypertension    Loop: Biotronic loop recorder in situ 05/06/2019   Scheduled Remote loop recorder check 06/09/2019: This represents an unremarkable monitoring session. The presenting rhythm is not available. No symptomatic or automatic episodes were triggered. Daily activity trends are stable. Heart rate variability trends are stable.   Migraines    Palpitations    PVC (premature ventricular contraction)    PVC's (premature ventricular contractions)    Seizures (HCC)    Syncope and collapse 06/10/2019    Past Surgical History:  Procedure Laterality Date   LAPAROSCOPY     TONSILLECTOMY     VAGUS NERVE STIMULATOR INSERTION      Family History   Adopted: Yes  Problem Relation Age of Onset   Healthy Daughter     Social History   Tobacco Use   Smoking status: Never   Smokeless tobacco: Never  Vaping Use   Vaping Use: Never used  Substance Use Topics   Alcohol use: Yes    Comment: socially   Drug use: No    No current facility-administered medications for this encounter.  Current Outpatient Medications:    predniSONE (DELTASONE) 20 MG tablet, Take 2 tablets (40 mg total) by mouth daily with breakfast for 5 days., Disp: 10 tablet, Rfl: 0   acebutolol (SECTRAL) 200 MG capsule, TAKE ONE CAPSULE BY MOUTH TWICE A DAY DISCONTINUE VERAPAMIL (Patient taking differently: Take 200 mg by mouth 2 (two) times daily. TAKE ONE CAPSULE BY MOUTH TWICE A DAY DISCONTINUE VERAPAMIL), Disp: 60 capsule, Rfl: 3   acetaminophen (TYLENOL) 325 MG tablet, Take by mouth., Disp: , Rfl:    buPROPion (WELLBUTRIN XL) 300 MG 24 hr tablet, Take 300 mg by mouth every morning., Disp: , Rfl:    diazepam (VALIUM) 5 MG tablet, Take 5 mg by mouth 2 (two) times daily as needed for muscle spasms., Disp: , Rfl:    diazepam (VALIUM) 5 MG tablet, Take by mouth., Disp: , Rfl:    escitalopram (LEXAPRO) 20 MG tablet, Take 20 mg by mouth daily. , Disp: , Rfl:    estradiol (VIVELLE-DOT) 0.075 MG/24HR, Place 1 patch onto the skin as needed (period bleeding)., Disp: , Rfl:    fluticasone (FLONASE) 50 MCG/ACT nasal spray, Place 2 sprays into both nostrils daily., Disp:  16 g, Rfl: 0   HYDROcodone-acetaminophen (NORCO/VICODIN) 5-325 MG tablet, Take 1-2 tablets by mouth every 6 (six) hours as needed., Disp: 12 tablet, Rfl: 0   lamoTRIgine (LAMICTAL) 25 MG tablet, PLEASE SEE ATTACHED FOR DETAILED DIRECTIONS, Disp: , Rfl:    levETIRAcetam (KEPPRA) 500 MG tablet, Take 1 tablet (500 mg total) by mouth 2 (two) times daily., Disp: 60 tablet, Rfl: 0   lidocaine (XYLOCAINE) 2 % solution, Use as directed 15 mLs in the mouth or throat as needed for mouth pain., Disp: 100 mL, Rfl: 0    LORazepam (ATIVAN) 1 MG tablet, Take 1 mg by mouth every 6 (six) hours as needed., Disp: , Rfl:    NAYZILAM 5 MG/0.1ML SOLN, Place into both nostrils daily., Disp: , Rfl:    norethindrone (AYGESTIN) 5 MG tablet, Take 5 mg by mouth daily., Disp: , Rfl:    NUVARING 0.12-0.015 MG/24HR vaginal ring, SMARTSIG:ring Vaginal Every 3 Weeks, Disp: , Rfl:    ondansetron (ZOFRAN) 4 MG tablet, Take 4 mg by mouth 3 (three) times daily as needed for nausea/vomiting., Disp: , Rfl:    phentermine 30 MG capsule, Take 30 mg by mouth every morning., Disp: , Rfl:    PHEXXI 1.8-1-0.4 % GEL, SMARTSIG:1 Application Vaginal Once PRN, Disp: , Rfl:    PRESCRIPTION MEDICATION, Apply 1 application topically every other day. Apply to face at night - Hers 20 ml Acne ( pump) Az/Zp (0.0125%), Disp: , Rfl:    rizatriptan (MAXALT-MLT) 10 MG disintegrating tablet, Take 10 mg by mouth daily as needed for migraine. , Disp: , Rfl:    sertraline (ZOLOFT) 100 MG tablet, Take 100 mg by mouth daily., Disp: , Rfl:    tiZANidine (ZANAFLEX) 4 MG tablet, Take 4-8 mg by mouth as needed for muscle spasms., Disp: , Rfl:    topiramate (TOPAMAX) 100 MG tablet, Take 100 mg by mouth daily., Disp: , Rfl:    valACYclovir (VALTREX) 500 MG tablet, Take 500 mg by mouth daily., Disp: , Rfl:    XCOPRI 14 x 150 MG & 14 x200 MG TBPK, Take 200 mg by mouth at bedtime., Disp: , Rfl:    zolpidem (AMBIEN) 10 MG tablet, Take 10 mg by mouth at bedtime., Disp: , Rfl:   Allergies  Allergen Reactions   Nsaids Other (See Comments)    History of bleeding ulcers   Benadryl [Diphenhydramine] Other (See Comments)    "Crawling feeling under skin"   Compazine [Prochlorperazine]    Ibuprofen     Other reaction(s): GI Upset (intolerance)   Emgality [Galcanezumab-Gnlm] Rash     ROS  As noted in HPI.   Physical Exam  BP 122/85   Pulse 96   Temp 98.1 F (36.7 C)   Resp 16   LMP 03/15/2022 (Exact Date)   SpO2 99%   Constitutional: Well developed, well  nourished, no acute distress.  Hoarse voice. Eyes:  EOMI, conjunctiva normal bilaterally HENT: Normocephalic, atraumatic,mucus membranes moist.  No nasal congestion.  Intensely erythematous oropharynx.  Exudates versus ulcers on tonsils.  No drooling, trismus.  Neck: Positive anterior cervical lymphadenopathy Respiratory: Normal inspiratory effort Cardiovascular: Normal rate, regular rhythm, no murmurs GI: nondistended, soft, no splenomegaly nontender skin: No rash, skin intact Musculoskeletal: no deformities Neurologic: Alert & oriented x 3, no focal neuro deficits Psychiatric: Speech and behavior appropriate   ED Course   Medications  diphenhydrAMINE (BENADRYL) 12.5 MG/5ML elixir 12.5 mg (12.5 mg Oral Given 04/14/22 0839)  alum & mag hydroxide-simeth (MAALOX/MYLANTA) 200-200-20  MG/5ML suspension 10 mL (10 mLs Oral Given 04/14/22 0839)    Orders Placed This Encounter  Procedures   DG Neck Soft Tissue    Standing Status:   Standing    Number of Occurrences:   1    Order Specific Question:   Reason for Exam (SYMPTOM  OR DIAGNOSIS REQUIRED)    Answer:   Severe sore throat, hoarseness, rule out epiglottitis, RPA   Nursing Communication Please set up with a PCP prior to discharge    Please set up with a PCP prior to discharge    Standing Status:   Standing    Number of Occurrences:   1    No results found for this or any previous visit (from the past 24 hour(s)). DG Neck Soft Tissue  Result Date: 04/14/2022 CLINICAL DATA:  Sore throat, hoarseness. EXAM: NECK SOFT TISSUES - 1+ VIEW COMPARISON:  None Available. FINDINGS: There is no evidence of retropharyngeal soft tissue swelling or epiglottic enlargement. The cervical airway is unremarkable. Stimulator lead is seen in the left-sided cervical tissues. IMPRESSION: No acute abnormality seen. Electronically Signed   By: Lupita Raider M.D.   On: 04/14/2022 09:13    ED Clinical Impression  1. Acute pharyngitis, unspecified etiology    2. Acute nonintractable headache, unspecified headache type   3. Laryngitis      ED Assessment/Plan      Patient with an acute illness with systemic symptoms of fever.  Previous records reviewed.  As noted in HPI.,  Severe sore throat, suspected viral etiology, but with new hoarseness and worsening symptoms, will obtain  neck lateral soft tissue neck to rule out epiglottitis, RPA.  Will give Benadryl/Maalox mixture here.  She took Tylenol within 6 hours of evaluation.  There is no evidence of meningitis today.  Imaging reviewed independently.  No RPA, evidence of epiglottitis.  See radiology report for details.  Reevaluation, patient states that she feels better.  Discussed with patient that steroids have limited evidence to help with laryngitis, however, patient would like to proceed with them..  Home with Tylenol, Benadryl/Maalox mixture, follow-up with PCP as needed.  No prescription for NSAIDs due to history of bleeding ulcers.  ER return precautions given.   will set up with a PCP prior to discharge.  Discussed  imaging, MDM, treatment plan, and plan for follow-up with patient. Discussed sn/sx that should prompt return to the ED. patient agrees with plan.   Meds ordered this encounter  Medications   diphenhydrAMINE (BENADRYL) 12.5 MG/5ML elixir 12.5 mg   alum & mag hydroxide-simeth (MAALOX/MYLANTA) 200-200-20 MG/5ML suspension 10 mL   predniSONE (DELTASONE) 20 MG tablet    Sig: Take 2 tablets (40 mg total) by mouth daily with breakfast for 5 days.    Dispense:  10 tablet    Refill:  0      *This clinic note was created using Scientist, clinical (histocompatibility and immunogenetics). Therefore, there may be occasional mistakes despite careful proofreading.  ?    Domenick Gong, MD 04/14/22 [phone]

## 2022-04-14 NOTE — Discharge Instructions (Signed)
1 gram of Tylenol 3-4 times a day as needed for pain.  Make sure you drink plenty of extra fluids.  Some people find salt water gargles and  Traditional Medicinal's "Throat Coat" tea helpful. Take 5 mL of liquid Benadryl and 5 mL of Maalox. Mix it together, and then hold it in your mouth for as long as you can and then swallow. You may do this 4 times a day.  The steroids may help.  Go to the ER if you get worse, neck stiffness, or for any other concerns.  Go to www.goodrx.com  or www.costplusdrugs.com to look up your medications. This will give you a list of where you can find your prescriptions at the most affordable prices. Or ask the pharmacist what the cash price is, or if they have any other discount programs available to help make your medication more affordable. This can be less expensive than what you would pay with insurance.

## 2022-04-14 NOTE — ED Triage Notes (Signed)
Pt. Presents to UC w/ c/o a constant fever, headache and lose of voice. Pt. States she was seen her on 11/08 with the same symptoms that have not resolved.

## 2022-05-08 ENCOUNTER — Ambulatory Visit
Admission: EM | Admit: 2022-05-08 | Discharge: 2022-05-08 | Disposition: A | Discharge status: Home / Self Care | Payer: BC Managed Care – PPO | Attending: Urgent Care | Admitting: Urgent Care

## 2022-05-08 DIAGNOSIS — G43919 Migraine, unspecified, intractable, without status migrainosus: Secondary | ICD-10-CM | POA: Insufficient documentation

## 2022-05-08 DIAGNOSIS — Z20822 Contact with and (suspected) exposure to covid-19: Secondary | ICD-10-CM | POA: Diagnosis present

## 2022-05-08 LAB — RESP PANEL BY RT-PCR (FLU A&B, COVID) ARPGX2
Influenza A by PCR: NEGATIVE
Influenza B by PCR: NEGATIVE
SARS Coronavirus 2 by RT PCR: NEGATIVE

## 2022-05-08 MED ORDER — DEXAMETHASONE SODIUM PHOSPHATE 10 MG/ML IJ SOLN
10.0000 mg | Freq: Once | INTRAMUSCULAR | Status: AC
  Administered 2022-05-08: 10 mg via INTRAMUSCULAR

## 2022-05-08 NOTE — Discharge Instructions (Signed)
Follow up here or with your primary care provider if your symptoms are worsening or not improving with treatment.     

## 2022-05-08 NOTE — ED Triage Notes (Addendum)
Pt. Presents to UC w/ c/o a headache for the past 4 days and a a low-grade fever that started yesterday. Pt. Has been treating her fever w/ tylenol and has now resolved. Pt. States she was recently exposed to COVID.

## 2022-05-08 NOTE — ED Provider Notes (Signed)
[name]    CSN: [id number] Arrival date & time: 05/08/22  1230      History   Chief Complaint Chief Complaint  Patient presents with   Headache   Fever    HPI Pamela Booker is a 26 y.o. female.    Headache Associated symptoms: fever   Fever Associated symptoms: headaches     Presents to urgent care with complaint of headache x 4 days as well as "low-grade fever" starting yesterday.  She has been treating herself with Tylenol and fever now resolved.  Patient states she was in contact with positive COVID patient recently.  Patient is treated for seizure disorder with frequent headaches.  She is taking all of her headache medication including triptans without any relief.  Past Medical History:  Diagnosis Date   Anxiety    Encounter for loop recorder check 06/10/2019   Epilepsy ([place])    Hypertension    Loop: Biotronic loop recorder in situ 05/06/2019   Scheduled Remote loop recorder check 06/09/2019: This represents an unremarkable monitoring session. The presenting rhythm is not available. No symptomatic or automatic episodes were triggered. Daily activity trends are stable. Heart rate variability trends are stable.   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   PVC (premature ventricular contraction) 03/11/2018   Endometriosis 03/14/2017   Atypical chest pain 04/25/2016   Palpitations 04/25/2016   Shortness of  breath 04/25/2016   Chronic migraine without aura without status migrainosus, not intractable 12/11/2014    Past Surgical History:  Procedure Laterality Date   LAPAROSCOPY     TONSILLECTOMY     VAGUS NERVE STIMULATOR INSERTION      OB History     Gravida  1   Para  0   Term  0   Preterm  0   AB  0   Living         SAB  0   IAB  0   Ectopic  0   Multiple      Live Births               Home Medications    Prior to Admission medications   Medication Sig Start Date End Date Taking? Authorizing Provider  acebutolol (SECTRAL) 200 MG capsule TAKE ONE CAPSULE BY MOUTH TWICE A DAY DISCONTINUE VERAPAMIL Patient taking differently: Take 200 mg by mouth 2 (two) times daily. TAKE ONE CAPSULE BY MOUTH TWICE A DAY DISCONTINUE VERAPAMIL 06/02/21   Cantwell, Celeste C, PA-C  acetaminophen (TYLENOL) 325 MG tablet Take by mouth.    [provider]  buPROPion (WELLBUTRIN XL) 300 MG 24 hr tablet Take 300 mg by mouth every morning. 11/03/20   [provider]  diazepam (VALIUM) 5 MG tablet Take 5 mg by mouth 2 (two) times daily as needed for muscle spasms. 05/31/21   [provider]  diazepam (VALIUM) 5 MG tablet Take by mouth. 05/31/21   [provider]  escitalopram (LEXAPRO) 20 MG tablet Take 20 mg by mouth daily.  08/23/18   [provider]  estradiol (VIVELLE-DOT) 0.075 MG/24HR Place 1 patch onto the skin as needed (period bleeding). 03/02/21   [provider]  fluticasone (FLONASE) 50 MCG/ACT nasal spray Place 2 sprays into both nostrils daily. 09/02/21   Melynda Ripple, MD  HYDROcodone-acetaminophen (NORCO/VICODIN) 5-325 MG tablet Take 1-2 tablets by mouth every 6 (six) hours as needed. 03/12/22   Kommor, [place], MD  lamoTRIgine (LAMICTAL) 25 MG tablet PLEASE SEE ATTACHED FOR DETAILED DIRECTIONS 09/22/21   [provider]  levETIRAcetam (KEPPRA) 500 MG tablet Take 1 tablet (500 mg total) by mouth 2 (two) times  daily. 05/06/20   Carmin Muskrat, MD  lidocaine (XYLOCAINE) 2 % solution Use as directed 15 mLs in the mouth or throat as needed for mouth pain. 04/12/22   Sharion Balloon, NP  LORazepam (ATIVAN) 1 MG tablet Take 1 mg by mouth every 6 (six) hours as needed. 03/03/22   [provider]  NAYZILAM 5 MG/0.1ML SOLN Place into both nostrils daily. 08/19/21   [provider]  norethindrone (AYGESTIN) 5 MG tablet Take 5 mg by mouth daily. 02/26/19 07/05/21  [provider]  NUVARING 0.12-0.015 MG/24HR vaginal ring SMARTSIG:ring Vaginal Every 3 Weeks 03/07/22   [provider]  ondansetron (ZOFRAN) 4 MG tablet Take 4 mg by mouth 3 (three) times daily as needed for nausea/vomiting. 01/22/22   [provider]  phentermine 30 MG capsule Take 30 mg by mouth every morning. 01/26/22   [provider]  PHEXXI 1.8-1-0.4 % GEL SMARTSIG:1 Application Vaginal Once PRN    [provider]  PRESCRIPTION MEDICATION Apply 1 application topically every other day. Apply to face at night - Hers 20 ml Acne ( pump) Az/Zp (0.0125%)    [provider]  rizatriptan (MAXALT-MLT) 10 MG disintegrating tablet Take 10 mg by mouth daily as needed for migraine.  03/17/19   [provider]  sertraline (ZOLOFT) 100 MG tablet Take 100 mg by mouth daily. 09/13/21   [provider]  tiZANidine (ZANAFLEX) 4 MG tablet Take 4-8 mg by mouth as needed for muscle spasms. 06/08/21   [provider]  topiramate (TOPAMAX) 100 MG tablet Take 100 mg by mouth daily.    [provider]  valACYclovir (VALTREX) 500 MG tablet Take 500 mg by mouth daily.    [provider]  XCOPRI 14 x 150 MG & 14 x200 MG TBPK Take 200 mg by mouth at bedtime. 11/11/20   [provider]  zolpidem (AMBIEN) 10 MG tablet Take 10 mg by mouth at bedtime. 01/26/22   [provider]  omeprazole (PRILOSEC) 20 MG capsule Take 1 capsule (20 mg total) by mouth daily for  14 days. Patient not taking: Reported on 05/06/2020 11/03/19 10/02/20  Darr, Edison Nasuti, PA-C    Family History Family History  Adopted: Yes  Problem Relation Age of Onset   Healthy Daughter     Social History Social History   Tobacco Use   Smoking status: Never   Smokeless tobacco: Never  Vaping Use   Vaping Use: Never used  Substance Use Topics   Alcohol use: Yes    Comment: socially   Drug use: No     Allergies   Nsaids, Benadryl [diphenhydramine], Compazine [prochlorperazine], Ibuprofen, and Emgality [galcanezumab-gnlm]   Review of Systems Review of Systems  Constitutional:  Positive for fever.  Neurological:  Positive for headaches.  Physical Exam Triage Vital Signs ED Triage Vitals [05/08/22 1306]  Enc Vitals Group     BP 106/74     Pulse Rate 90     Resp 17     Temp 98.1 F (36.7 C)     Temp src      SpO2 98 %     Weight      Height      Head Circumference      Peak Flow      Pain Score 7     Pain Loc      Pain Edu?      Excl. in GC?    No data found.  Updated Vital Signs BP 106/74   Pulse 90   Temp 98.1 F (36.7 C)   Resp 17   LMP 04/08/2022 (Exact Date)   SpO2 98%   Visual Acuity Right Eye Distance:   Left Eye Distance:   Bilateral Distance:    Right Eye Near:   Left Eye Near:    Bilateral Near:     Physical Exam Vitals reviewed.  Constitutional:      Appearance: She is well-developed. She is ill-appearing.  Neurological:     Mental Status: She is alert and oriented to person, place, and time.  Psychiatric:        Mood and Affect: Mood normal.        Behavior: Behavior normal.      UC Treatments / Results  Labs (all labs ordered are listed, but only abnormal results are displayed) Labs Reviewed - No data to display  EKG   Radiology No results found.  Procedures Procedures (including critical care time)  Medications Ordered in UC Medications - No data to display  Initial Impression / Assessment and Plan / UC  Course  I have reviewed the triage vital signs and the nursing notes.  Pertinent labs & imaging results that were available during my care of the patient were reviewed by me and considered in my medical decision making (see chart for details).   Respiratory swab for flu and COVID was ordered and results pending.  Will treat migraine with Decadron.  Unable to prescribe promethazine due to intolerance of Compazine.  Patient is advised to get some bed rest in darkened room.   Final Clinical Impressions(s) / UC Diagnoses   Final diagnoses:  None   Discharge Instructions   None    ED Prescriptions   None    PDMP not reviewed this encounter.   Charma Igo, [place] 05/08/22 1316

## 2022-05-14 ENCOUNTER — Emergency Department
Admission: EM | Admit: 2022-05-14 | Discharge: 2022-05-14 | Disposition: A | Discharge status: Home / Self Care | Payer: BC Managed Care – PPO | Attending: Emergency Medicine | Admitting: Emergency Medicine

## 2022-05-14 ENCOUNTER — Other Ambulatory Visit: Payer: Self-pay

## 2022-05-14 DIAGNOSIS — R258 Other abnormal involuntary movements: Secondary | ICD-10-CM | POA: Insufficient documentation

## 2022-05-14 DIAGNOSIS — Y907 Blood alcohol level of 200-239 mg/100 ml: Secondary | ICD-10-CM | POA: Insufficient documentation

## 2022-05-14 DIAGNOSIS — R569 Unspecified convulsions: Secondary | ICD-10-CM

## 2022-05-14 DIAGNOSIS — F1010 Alcohol abuse, uncomplicated: Secondary | ICD-10-CM | POA: Diagnosis not present

## 2022-05-14 DIAGNOSIS — F10920 Alcohol use, unspecified with intoxication, uncomplicated: Secondary | ICD-10-CM

## 2022-05-14 LAB — COMPREHENSIVE METABOLIC PANEL
ALT: 35 U/L (ref 0–44)
AST: 25 U/L (ref 15–41)
Albumin: 4.5 g/dL (ref 3.5–5.0)
Alkaline Phosphatase: 79 U/L (ref 38–126)
Anion gap: 12 (ref 5–15)
BUN: 13 mg/dL (ref 6–20)
CO2: 20 mmol/L — ABNORMAL LOW (ref 22–32)
Calcium: 9.5 mg/dL (ref 8.9–10.3)
Chloride: 110 mmol/L (ref 98–111)
Creatinine, Ser: 0.71 mg/dL (ref 0.44–1.00)
GFR, Estimated: >60 mL/min (ref >60)
Glucose, Bld: 116 mg/dL — ABNORMAL HIGH (ref 70–99)
Potassium: 4.1 mmol/L (ref 3.5–5.1)
Sodium: 142 mmol/L (ref 135–145)
Total Bilirubin: 0.5 mg/dL (ref 0.3–1.2)
Total Protein: 7.2 g/dL (ref 6.5–8.1)

## 2022-05-14 LAB — CBC WITH DIFFERENTIAL/PLATELET
Abs Immature Granulocytes: 0.03 10*3/uL (ref 0.00–0.07)
Basophils Absolute: 0 10*3/uL (ref 0.0–0.1)
Basophils Relative: 0 %
Eosinophils Absolute: 0.2 10*3/uL (ref 0.0–0.5)
Eosinophils Relative: 2 %
HCT: 45.1 % (ref 36.0–46.0)
Hemoglobin: 15 g/dL (ref 12.0–15.0)
Immature Granulocytes: 0 %
Lymphocytes Relative: 45 %
Lymphs Abs: 4.4 10*3/uL — ABNORMAL HIGH (ref 0.7–4.0)
MCH: 31.7 pg (ref 26.0–34.0)
MCHC: 33.3 g/dL (ref 30.0–36.0)
MCV: 95.3 fL (ref 80.0–100.0)
Monocytes Absolute: 0.9 10*3/uL (ref 0.1–1.0)
Monocytes Relative: 9 %
Neutro Abs: 4.3 10*3/uL (ref 1.7–7.7)
Neutrophils Relative %: 44 %
Platelets: 329 10*3/uL (ref 150–400)
RBC: 4.73 MIL/uL (ref 3.87–5.11)
RDW: 12.3 % (ref 11.5–15.5)
WBC: 9.9 10*3/uL (ref 4.0–10.5)
nRBC: 0 % (ref 0.0–0.2)

## 2022-05-14 LAB — ACETAMINOPHEN LEVEL: Acetaminophen (Tylenol), Serum: <10 ug/mL — ABNORMAL LOW (ref 10–30)

## 2022-05-14 LAB — ETHANOL: Alcohol, Ethyl (B): 222 mg/dL — ABNORMAL HIGH (ref <10)

## 2022-05-14 LAB — SALICYLATE LEVEL: Salicylate Lvl: <7 mg/dL — ABNORMAL LOW (ref 7.0–30.0)

## 2022-05-14 MED ORDER — LACTATED RINGERS IV BOLUS
1000.0000 mL | Freq: Once | INTRAVENOUS | Status: AC
  Administered 2022-05-14: 1000 mL via INTRAVENOUS

## 2022-05-14 MED ORDER — LEVETIRACETAM IN NACL 1500 MG/100ML IV SOLN
1500.0000 mg | Freq: Once | INTRAVENOUS | Status: AC
  Administered 2022-05-14: 1500 mg via INTRAVENOUS
  Filled 2022-05-14: qty 100

## 2022-05-14 MED ORDER — DIAZEPAM 5 MG PO TABS
5.0000 mg | ORAL_TABLET | Freq: Once | ORAL | Status: AC
  Administered 2022-05-14: 5 mg via ORAL
  Filled 2022-05-14: qty 1

## 2022-05-14 MED ORDER — LORAZEPAM 2 MG/ML IJ SOLN
INTRAMUSCULAR | Status: AC
  Administered 2022-05-14: 2 mg
  Filled 2022-05-14: qty 1

## 2022-05-14 NOTE — ED Notes (Signed)
Pt ambulatory to the restroom without difficulty. Gait steady and even.  

## 2022-05-14 NOTE — ED Notes (Signed)
Pt awake and alert, in NAD. States she feels fine and would like to go home.

## 2022-05-14 NOTE — Discharge Instructions (Addendum)
Please continue all of your seizure medications.  Would recommend you stop drinking alcohol as this can cause breakthrough seizures even if you are taking all of your medications appropriately.

## 2022-05-14 NOTE — ED Provider Notes (Signed)
Procedures  Clinical Course as of 05/14/22 0813  Sun May 14, 2022  0147 Reassessed.  Quite upset.  Tells me about her psychosocial stressors.  Requesting valium, [DS]  6283 Reassessed.  Fast asleep. [DS]  0409 reassessed [DS]    Clinical Course User Index [DS] Delton Prairie, MD    ----------------------------------------- 8:13 AM on 05/14/2022 -----------------------------------------   Patient feeling better.,  Awake, alert, oriented, clear speech, ambulatory.   Sharman Cheek, MD 05/14/22 [phone]

## 2022-05-14 NOTE — ED Notes (Signed)
Pt sleeping. 

## 2022-05-14 NOTE — ED Provider Notes (Signed)
[facility] Provider Note    Event Date/Time   First MD Initiated Contact with Patient 05/14/22 0129     (approximate)   History   Seizures   HPI  Pamela Booker is a 26 y.o. female who presents to the ED for evaluation of Seizures   I reviewed the neurology consultation from 8/27.  She has a history of both epilepsy and PNES.  Vagal nerve stimulator in place, anxiety and multiple antiepileptic medications.  Patient was reportedly at a house party drinking ethanol with friends.  Reportedly had a 7-8 shots of liquor.  She got into a verbal argument with her boyfriend, got upset and ran out of the house crying.  She reportedly fell in the front yard and had a witnessed seizure that lasted about 5 minutes and was generalized.  No seizures with EMS, until she arrives.  She received 2 mg of IV Ativan on arrival and has no further subsequent seizures.  As she is awakening, she tells me that she has had no recent illnesses or acute events.  No trauma, injuries, falls or syncope.   Physical Exam   Triage Vital Signs: ED Triage Vitals [05/14/22 0135]  Enc Vitals Group     BP (!) 140/93     Pulse Rate (!) 148     Resp (!) 26     Temp      Temp src      SpO2 99 %     Weight      Height      Head Circumference      Peak Flow      Pain Score      Pain Loc      Pain Edu?      Excl. in GC?     Most recent vital signs: Vitals:   05/14/22 0645 05/14/22 0700  BP: (!) 98/55 92/61  Pulse: 94 86  Resp: 16 11  Temp:    SpO2: 97% 99%    General: Awake, no distress.  CV:  Good peripheral perfusion.  Resp:  Normal effort.  Abd:  No distention.  MSK:  No deformity noted.  Neuro:  No focal deficits appreciated. Cranial nerves II through XII intact 5/5 strength and sensation in all 4 extremities Other:     ED Results / Procedures / Treatments   Labs (all labs ordered are listed, but only abnormal results are displayed) Labs Reviewed   COMPREHENSIVE METABOLIC PANEL - Abnormal; Notable for the following components:      Result Value   CO2 20 (*)    Glucose, Bld 116 (*)    All other components within normal limits  CBC WITH DIFFERENTIAL/PLATELET - Abnormal; Notable for the following components:   Lymphs Abs 4.4 (*)    All other components within normal limits  ETHANOL - Abnormal; Notable for the following components:   Alcohol, Ethyl (B) 222 (*)    All other components within normal limits  ACETAMINOPHEN LEVEL - Abnormal; Notable for the following components:   Acetaminophen (Tylenol), Serum <10 (*)    All other components within normal limits  SALICYLATE LEVEL - Abnormal; Notable for the following components:   Salicylate Lvl <7.0 (*)    All other components within normal limits  URINALYSIS, ROUTINE W REFLEX MICROSCOPIC  URINE DRUG SCREEN, QUALITATIVE ([facility] ONLY)  LEVETIRACETAM LEVEL  POC URINE PREG, ED    EKG   RADIOLOGY   Official radiology report(s): No results found.  PROCEDURES  and INTERVENTIONS:  .1-3 Lead EKG Interpretation  Performed by: Vladimir Crofts, MD Authorized by: Vladimir Crofts, MD     Interpretation: abnormal     ECG rate:  110   ECG rate assessment: tachycardic     Rhythm: sinus tachycardia     Ectopy: none     Conduction: normal   .Critical Care  Performed by: Vladimir Crofts, MD Authorized by: Vladimir Crofts, MD   Critical care provider statement:    Critical care time (minutes):  30   Critical care time was exclusive of:  Separately billable procedures and treating other patients   Critical care was necessary to treat or prevent imminent or life-threatening deterioration of the following conditions:  CNS failure or compromise   Critical care was time spent personally by me on the following activities:  Development of treatment plan with patient or surrogate, discussions with consultants, evaluation of patient's response to treatment, examination of patient, ordering and review of  laboratory studies, ordering and review of radiographic studies, ordering and performing treatments and interventions, pulse oximetry, re-evaluation of patient's condition and review of old charts   Medications  LORazepam (ATIVAN) 2 MG/ML injection (2 mg  Given 05/14/22 0130)  lactated ringers bolus 1,000 mL (0 mLs Intravenous Stopped 05/14/22 0438)  levETIRAcetam (KEPPRA) IVPB 1500 mg/ 100 mL premix (0 mg Intravenous Stopped 05/14/22 0438)  diazepam (VALIUM) tablet 5 mg (5 mg Oral Given 05/14/22 0152)     IMPRESSION / MDM / ASSESSMENT AND PLAN / ED CURSE  I reviewed the triage vital signs and the nursing notes.  Differential diagnosis includes, but is not limited to, status epilepticus, pseudoseizures, noncompliance with antiepileptics, dehydration, polysubstance abuse, ethanol ingestion, malingering  {Patient presents with symptoms of an acute illness or injury that is potentially life-threatening.  26 year old female with a seizure history presents after seizure-like activity and psychosocial stressors, uncertain if this represents a pseudoseizure versus isolated breakthrough seizure, but no signs of status epilepticus.  She receives benzodiazepines on arrival and subsequently tearfully providing history without evident postictal period.  I have a strong suspicion for a nonepileptic seizure, though there is certainly some uncertainty.  She was observed for greater than 6 hours during my shift and has no recurrence of the seizures.  Mental status is improving, tachycardia is resolved and her blood work looks good.  Normal CBC and metabolic panel.  Ethanol slightly elevated, as per her history.  She is signed out to oncoming provider pending return to clinical sobriety, p.o. and ambulatory trials.  I suspect she would be suitable for outpatient management as long as there is no further worsening  Clinical Course as of 05/14/22 0720  Sun May 14, 2022  0147 Reassessed.  Quite upset.  Tells me  about her psychosocial stressors.  Requesting valium, [DS]  [id number] Reassessed.  Fast asleep. [DS]  [id number] reassessed [DS]    Clinical Course User Index [DS] Vladimir Crofts, MD     FINAL CLINICAL IMPRESSION(S) / ED DIAGNOSES   Final diagnoses:  Seizure-like activity ([place])     Rx / DC Orders   ED Discharge Orders     None        Note:  This document was prepared using Dragon voice recognition software and may include unintentional dictation errors.   Vladimir Crofts, MD 05/14/22 [phone]

## 2022-05-14 NOTE — ED Notes (Signed)
Patient Alert and oriented to baseline. Stable and ambulatory to baseline. Patient verbalized understanding of the discharge instructions.  Patient belongings were taken by the patient.   

## 2022-05-14 NOTE — ED Triage Notes (Signed)
Pt comes from house party va [facility] c/o seizures. Bystander states pt has 7 shots of alcohol and fell out in yard and started seizing. Friends state sge was seizing for about 5 mins. Hx of seizures.

## 2022-05-15 LAB — LEVETIRACETAM LEVEL: Levetiracetam Lvl: <2 ug/mL — ABNORMAL LOW (ref 10.0–40.0)

## 2022-06-02 ENCOUNTER — Ambulatory Visit: Payer: Self-pay | Admitting: Cardiology

## 2022-06-05 HISTORY — PX: ABLATION, ENDOMETRIOSIS, ROBOT ASSIST, LAPAROSCOPIC: SHX7528

## 2022-06-05 HISTORY — PX: ENDOMETRIAL ABLATION: SHX621

## 2022-06-06 DIAGNOSIS — N80329 Endometriosis of the posterior cul-de-sac, unspecified depth: Secondary | ICD-10-CM | POA: Diagnosis not present

## 2022-06-06 DIAGNOSIS — Z01818 Encounter for other preprocedural examination: Secondary | ICD-10-CM | POA: Diagnosis not present

## 2022-06-06 DIAGNOSIS — N80A0 Endometriosis of bladder, unspecified depth: Secondary | ICD-10-CM | POA: Diagnosis not present

## 2022-06-06 DIAGNOSIS — G40209 Localization-related (focal) (partial) symptomatic epilepsy and epileptic syndromes with complex partial seizures, not intractable, without status epilepticus: Secondary | ICD-10-CM

## 2022-06-06 DIAGNOSIS — N80351 Endometriosis of the right pelvic sidewall, unspecified depth: Secondary | ICD-10-CM | POA: Diagnosis not present

## 2022-06-06 DIAGNOSIS — R87612 Low grade squamous intraepithelial lesion on cytologic smear of cervix (LGSIL): Secondary | ICD-10-CM | POA: Diagnosis not present

## 2022-06-06 DIAGNOSIS — G40219 Localization-related (focal) (partial) symptomatic epilepsy and epileptic syndromes with complex partial seizures, intractable, without status epilepticus: Secondary | ICD-10-CM | POA: Insufficient documentation

## 2022-06-06 HISTORY — DX: Localization-related (focal) (partial) symptomatic epilepsy and epileptic syndromes with complex partial seizures, not intractable, without status epilepticus: G40.209

## 2022-06-07 DIAGNOSIS — N80111 Superficial endometriosis of right ovary: Secondary | ICD-10-CM | POA: Diagnosis not present

## 2022-06-07 DIAGNOSIS — N80322 Deep endometriosis of the posterior cul-de-sac: Secondary | ICD-10-CM | POA: Diagnosis not present

## 2022-06-07 DIAGNOSIS — N808 Other endometriosis: Secondary | ICD-10-CM | POA: Diagnosis not present

## 2022-06-07 DIAGNOSIS — N803C2 Endometriosis of the left uterosacral ligament, unspecified depth: Secondary | ICD-10-CM | POA: Diagnosis not present

## 2022-06-07 DIAGNOSIS — N80A1 Superficial endometriosis of bladder: Secondary | ICD-10-CM | POA: Diagnosis not present

## 2022-06-07 DIAGNOSIS — N80212 Superficial endometriosis of left fallopian tube: Secondary | ICD-10-CM | POA: Diagnosis not present

## 2022-06-07 DIAGNOSIS — N80A51 Deep endometriosis of right ureter: Secondary | ICD-10-CM | POA: Diagnosis not present

## 2022-06-07 DIAGNOSIS — N736 Female pelvic peritoneal adhesions (postinfective): Secondary | ICD-10-CM | POA: Diagnosis not present

## 2022-06-09 ENCOUNTER — Ambulatory Visit
Admission: EM | Admit: 2022-06-09 | Discharge: 2022-06-09 | Disposition: A | Discharge status: Home / Self Care | Payer: 59

## 2022-06-09 DIAGNOSIS — J069 Acute upper respiratory infection, unspecified: Secondary | ICD-10-CM | POA: Diagnosis not present

## 2022-06-09 MED ORDER — HYDROCOD POLI-CHLORPHE POLI ER 10-8 MG/5ML PO SUER: 5.0000 mL | Freq: Two times a day (BID) | ORAL | 0 refills | Status: DC | PRN

## 2022-06-09 MED ORDER — HYDROCOD POLI-CHLORPHE POLI ER 10-8 MG/5ML PO SUER: 5.0000 mL | Freq: Two times a day (BID) | ORAL | 0 refills | Status: AC | PRN

## 2022-06-09 MED ORDER — BENZONATATE 100 MG PO CAPS: ORAL_CAPSULE | ORAL | 0 refills | Status: DC

## 2022-06-09 NOTE — ED Provider Notes (Signed)
Pamela Booker    CSN: [id number] Arrival date & time: 06/09/22  1714      History   Chief Complaint Chief Complaint  Patient presents with   Cough   Nasal Congestion   Generalized Body Aches    HPI Pamela Booker is a 27 y.o. female.    Cough   Patient presents to urgent care with complaint of cough, congestions, body ache starting yesterday.  She is 2 days s/p abdominal laparoscopically for endometriosis.  PMH includes irregular menstrual and intermenstrual bleeding.  Past Medical History:  Diagnosis Date   Anxiety    Encounter for loop recorder check 06/10/2019   Epilepsy ([place])    Hypertension    Loop: Biotronic loop recorder in situ 05/06/2019   Scheduled Remote loop recorder check 06/09/2019: This represents an unremarkable monitoring session. The presenting rhythm is not available. No symptomatic or automatic episodes were triggered. Daily activity trends are stable. Heart rate variability trends are stable.   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   PVC (premature ventricular contraction) 03/11/2018   Endometriosis 03/14/2017   Atypical chest pain 04/25/2016   Palpitations 04/25/2016   Shortness of breath 04/25/2016   Chronic migraine without aura without status migrainosus, not intractable 12/11/2014    Past Surgical History:  Procedure Laterality Date   LAPAROSCOPY     TONSILLECTOMY     VAGUS NERVE  STIMULATOR INSERTION      OB History     Gravida  1   Para  0   Term  0   Preterm  0   AB  0   Living         SAB  0   IAB  0   Ectopic  0   Multiple      Live Births               Home Medications    Prior to Admission medications   Medication Sig Start Date End Date Taking? Authorizing Provider  Docusate Sodium (DSS) 100 MG CAPS Take by mouth. 06/06/22  Yes [provider]  oxyCODONE-acetaminophen (PERCOCET/ROXICET) 5-325 MG tablet Take by mouth. 06/09/22 06/14/22 Yes [provider]  acebutolol (SECTRAL) 200 MG capsule TAKE ONE CAPSULE BY MOUTH TWICE A DAY DISCONTINUE VERAPAMIL Patient taking differently: Take 200 mg by mouth 2 (two) times daily. TAKE ONE CAPSULE BY MOUTH TWICE A DAY DISCONTINUE VERAPAMIL 06/02/21   Cantwell, Celeste C, PA-C  acetaminophen (TYLENOL) 325 MG tablet Take by mouth.    [provider]  buPROPion (WELLBUTRIN XL) 300 MG 24 hr tablet Take 300 mg by mouth every morning. 11/03/20   [provider]  diazepam (VALIUM) 5 MG tablet Take 5 mg by mouth 2 (two) times daily as needed for muscle spasms. 05/31/21   [provider]  diazepam (VALIUM) 5 MG tablet Take by mouth. 05/31/21   [provider]  escitalopram (LEXAPRO) 20 MG tablet Take 20  mg by mouth daily.  08/23/18   [provider]  estradiol (VIVELLE-DOT) 0.075 MG/24HR Place 1 patch onto the skin as needed (period bleeding). 03/02/21   [provider]  fluticasone (FLONASE) 50 MCG/ACT nasal spray Place 2 sprays into both nostrils daily. 09/02/21   Melynda Ripple, MD  HYDROcodone-acetaminophen (NORCO/VICODIN) 5-325 MG tablet Take 1-2 tablets by mouth every 6 (six) hours as needed. 03/12/22   Kommor, [place], MD  lamoTRIgine (LAMICTAL) 25 MG tablet PLEASE SEE ATTACHED FOR DETAILED DIRECTIONS 09/22/21   [provider]  levETIRAcetam (KEPPRA) 500 MG tablet Take 1 tablet (500 mg total) by mouth 2 (two) times daily.  05/06/20   Carmin Muskrat, MD  lidocaine (XYLOCAINE) 2 % solution Use as directed 15 mLs in the mouth or throat as needed for mouth pain. 04/12/22   Sharion Balloon, NP  LORazepam (ATIVAN) 1 MG tablet Take 1 mg by mouth every 6 (six) hours as needed. 03/03/22   [provider]  NAYZILAM 5 MG/0.1ML SOLN Place into both nostrils daily. 08/19/21   [provider]  norethindrone (AYGESTIN) 5 MG tablet Take 5 mg by mouth daily. 02/26/19 07/05/21  [provider]  NUVARING 0.12-0.015 MG/24HR vaginal ring SMARTSIG:ring Vaginal Every 3 Weeks 03/07/22   [provider]  ondansetron (ZOFRAN) 4 MG tablet Take 4 mg by mouth 3 (three) times daily as needed for nausea/vomiting. 01/22/22   [provider]  phentermine 30 MG capsule Take 30 mg by mouth every morning. 01/26/22   [provider]  PHEXXI 1.8-1-0.4 % GEL SMARTSIG:1 Application Vaginal Once PRN    [provider]  PRESCRIPTION MEDICATION Apply 1 application topically every other day. Apply to face at night - Hers 20 ml Acne ( pump) Az/Zp (0.0125%)    [provider]  rizatriptan (MAXALT-MLT) 10 MG disintegrating tablet Take 10 mg by mouth daily as needed for migraine.  03/17/19   [provider]  sertraline (ZOLOFT) 100 MG tablet Take 100 mg by mouth daily. 09/13/21   [provider]  tiZANidine (ZANAFLEX) 4 MG tablet Take 4-8 mg by mouth as needed for muscle spasms. 06/08/21   [provider]  topiramate (TOPAMAX) 100 MG tablet Take 100 mg by mouth daily.    [provider]  valACYclovir (VALTREX) 500 MG tablet Take 500 mg by mouth daily.    [provider]  XCOPRI 14 x 150 MG & 14 x200 MG TBPK Take 200 mg by mouth at bedtime. 11/11/20   [provider]  zolpidem (AMBIEN) 10 MG tablet Take 10 mg by mouth at bedtime. 01/26/22   [provider]  omeprazole (PRILOSEC) 20 MG capsule Take 1 capsule (20 mg total) by mouth daily for 14  days. Patient not taking: Reported on 05/06/2020 11/03/19 10/02/20  Darr, Edison Nasuti, PA-C    Family History Family History  Adopted: Yes  Problem Relation Age of Onset   Healthy Daughter     Social History Social History   Tobacco Use   Smoking status: Never   Smokeless tobacco: Never  Vaping Use   Vaping Use: Never used  Substance Use Topics   Alcohol use: Yes    Comment: socially   Drug use: No     Allergies   Nsaids, Benadryl [diphenhydramine], Compazine [prochlorperazine], Ibuprofen, Keppra [levetiracetam], Zolpidem, and Emgality [galcanezumab-gnlm]   Review of Systems Review of Systems  Respiratory:  Positive for cough.      Physical Exam Triage Vital Signs ED Triage Vitals  Enc Vitals Group     BP 06/09/22 1743 115/74     Pulse Rate 06/09/22 1743 74     Resp 06/09/22 1743 16     Temp 06/09/22 1743 99 F (37.2 C)     Temp Source 06/09/22 1743 Oral     SpO2 06/09/22 1743 97 %     Weight --      Height --      Head Circumference --      Peak Flow --      Pain Score 06/09/22 1745 0     Pain Loc --      Pain Edu? --      Excl. in [place]? --    No data found.  Updated Vital Signs BP 115/74 (BP Location: Left Arm)   Pulse 74   Temp 99 F (37.2 C) (Oral)   Resp 16   LMP 05/30/2022 (Approximate)   SpO2 97%   Visual Acuity Right Eye Distance:   Left Eye Distance:   Bilateral Distance:    Right Eye Near:   Left Eye Near:    Bilateral Near:     Physical Exam Vitals reviewed.  Constitutional:      Appearance: Normal appearance. She is ill-appearing.  Cardiovascular:     Rate and Rhythm: Normal rate and regular rhythm.     Pulses: Normal pulses.     Heart sounds: Normal heart sounds.  Pulmonary:     Effort: Pulmonary effort is normal.     Breath sounds: Normal breath sounds.  Abdominal:     Tenderness: There is abdominal tenderness.  Skin:    General: Skin is warm and dry.  Neurological:     General: No focal deficit present.     Mental  Status: She is alert and oriented to person, place, and time.  Psychiatric:        Mood and Affect: Mood normal.        Behavior: Behavior normal.      UC Treatments / Results  Labs (all labs ordered are listed, but only abnormal results are displayed) Labs Reviewed - No data to display  EKG   Radiology No results found.  Procedures Procedures (including critical care time)  Medications Ordered in UC Medications - No data to display  Initial Impression / Assessment and Plan / UC Course  I have reviewed the triage vital signs and the nursing notes.  Pertinent labs & imaging results that were available during my care of the patient were reviewed by me and considered in my medical decision making (see chart for details).   Patient is afebrile here without recent antipyretics. Satting well on room air. Overall is ill appearing, well hydrated, without respiratory distress. Pulmonary exam is unremarkable.  Lungs CTAB without wheezing, rhonchi, rales.  Abdomen is acutely tender secondary to recent laparoscopic surgery.  The cough is aggravating her abdominal pain.  She is sporadically using Percocet to control her pain.  Patient's symptoms are consistent with an acute viral process, less likely influenza or COVID.  Recommended and excepted prescription for cough suppressants benzonatate and Tussionex.  Warned patient against using Tussionex along with Percocet until she is familiar with the effects of Tussionex.  Concern for respiratory depression.  Also warned her against using other nighttime cough medicine until she is familiar with the effects of Tussionex on her sleep patterns.  Okay to use other OTC cough suppressants.    Final Clinical Impressions(s) / UC Diagnoses   Final diagnoses:  None   Discharge Instructions   None    ED Prescriptions   None    PDMP not reviewed this encounter.   Rose Phi, [place] 06/09/22 1910

## 2022-06-09 NOTE — Discharge Instructions (Addendum)
You have been diagnosed with a viral upper respiratory infection based on your symptoms and exam. Viral illnesses cannot be treated with antibiotics - they are self limiting - and you should find your symptoms resolving within a few days. Get plenty of rest and non-caffeinated fluids. Watch for signs of dehydration including reduced urine output and dark colored urine.  We recommend you use over-the-counter medications for symptom control including acetaminophen (Tylenol), ibuprofen (Advil/Motrin) or naproxen (Aleve) for fever, chills or body aches. You may combine use of acetaminophen and ibuprofen/naproxen if needed. Also recommend cold/cough medication containing dextromethorphan, a cough suppressant.    I have prescribed additional cough medications benzonatate, a nonsedating medication which you can take 3 times a day and Tussionex a narcotic containing cough medication which will cause sedation.  You are able to use the Tussionex day or night as long as you do not drive or operate machinery.  The medications can be combined, and combined with other OTC medications you may be using.  Be careful about using Tussionex along with the Percocet that was prescribed for your abdominal pain.  I would also suggest being careful with using Tussionex along with a nighttime cough medicine containing another sedating medication.  Saline mist spray is helpful for removing excess mucus from your nose.  Room humidifiers are helpful to ease breathing at night. I recommend guaifenesin (Mucinex) with plenty of water throughout the day to help thin and loosen mucus secretions in your respiratory passages.   You might also find relief of nasal/sinus congestion symptoms by using a nasal decongestant such as Flonase (fluticasone) or Sudafed sinus (pseudoephedrine).  You will need to obtain Sudafed from behind the pharmacist counter.  Speak to the pharmacist to verify that you are not duplicating medications with other  over-the-counter formulations that you may be using.

## 2022-06-09 NOTE — ED Triage Notes (Signed)
Pt.presents to UC w/ c/o a cough, congestion and body aches that started yesterday. Pt. Pamela Booker,so states she just had a laparoscopy done 2 days ago.

## 2022-06-16 ENCOUNTER — Emergency Department: Payer: 59

## 2022-06-16 ENCOUNTER — Other Ambulatory Visit: Payer: Self-pay

## 2022-06-16 ENCOUNTER — Emergency Department
Admission: EM | Admit: 2022-06-16 | Discharge: 2022-06-16 | Disposition: A | Discharge status: Home / Self Care | Payer: 59 | Attending: Emergency Medicine | Admitting: Emergency Medicine

## 2022-06-16 ENCOUNTER — Encounter: Payer: Self-pay | Admitting: Radiology

## 2022-06-16 DIAGNOSIS — R109 Unspecified abdominal pain: Secondary | ICD-10-CM | POA: Diagnosis not present

## 2022-06-16 DIAGNOSIS — G8918 Other acute postprocedural pain: Secondary | ICD-10-CM | POA: Insufficient documentation

## 2022-06-16 LAB — COMPREHENSIVE METABOLIC PANEL
ALT: 41 U/L (ref 0–44)
AST: 25 U/L (ref 15–41)
Albumin: 4.6 g/dL (ref 3.5–5.0)
Alkaline Phosphatase: 69 U/L (ref 38–126)
Anion gap: 8 (ref 5–15)
BUN: 12 mg/dL (ref 6–20)
CO2: 27 mmol/L (ref 22–32)
Calcium: 10 mg/dL (ref 8.9–10.3)
Chloride: 103 mmol/L (ref 98–111)
Creatinine, Ser: 0.64 mg/dL (ref 0.44–1.00)
GFR, Estimated: >60 mL/min (ref >60)
Glucose, Bld: 96 mg/dL (ref 70–99)
Potassium: 3.7 mmol/L (ref 3.5–5.1)
Sodium: 138 mmol/L (ref 135–145)
Total Bilirubin: 0.8 mg/dL (ref 0.3–1.2)
Total Protein: 7.5 g/dL (ref 6.5–8.1)

## 2022-06-16 LAB — URINALYSIS, ROUTINE W REFLEX MICROSCOPIC
Bilirubin Urine: NEGATIVE
Glucose, UA: 50 mg/dL — AB
Hgb urine dipstick: NEGATIVE
Ketones, ur: NEGATIVE mg/dL
Leukocytes,Ua: NEGATIVE
Nitrite: NEGATIVE
Protein, ur: NEGATIVE mg/dL
Specific Gravity, Urine: 1.023 (ref 1.005–1.030)
pH: 6 (ref 5.0–8.0)

## 2022-06-16 LAB — CBC
HCT: 48.4 % — ABNORMAL HIGH (ref 36.0–46.0)
Hemoglobin: 16.1 g/dL — ABNORMAL HIGH (ref 12.0–15.0)
MCH: 31.6 pg (ref 26.0–34.0)
MCHC: 33.3 g/dL (ref 30.0–36.0)
MCV: 94.9 fL (ref 80.0–100.0)
Platelets: 308 10*3/uL (ref 150–400)
RBC: 5.1 MIL/uL (ref 3.87–5.11)
RDW: 12 % (ref 11.5–15.5)
WBC: 7.8 10*3/uL (ref 4.0–10.5)
nRBC: 0 % (ref 0.0–0.2)

## 2022-06-16 LAB — PREGNANCY, URINE: Preg Test, Ur: NEGATIVE

## 2022-06-16 LAB — LIPASE, BLOOD: Lipase: 34 U/L (ref 11–51)

## 2022-06-16 MED ORDER — HYDROCODONE-ACETAMINOPHEN 5-325 MG PO TABS: 1.0000 | ORAL_TABLET | ORAL | 0 refills | Status: DC | PRN

## 2022-06-16 MED ORDER — MORPHINE SULFATE (PF) 4 MG/ML IV SOLN
4.0000 mg | Freq: Once | INTRAVENOUS | Status: AC
  Administered 2022-06-16: 4 mg via INTRAVENOUS
  Filled 2022-06-16: qty 1

## 2022-06-16 MED ORDER — ONDANSETRON HCL 4 MG/2ML IJ SOLN
4.0000 mg | Freq: Once | INTRAMUSCULAR | Status: AC
  Administered 2022-06-16: 4 mg via INTRAVENOUS
  Filled 2022-06-16: qty 2

## 2022-06-16 MED ORDER — SODIUM CHLORIDE 0.9 % IV BOLUS
1000.0000 mL | Freq: Once | INTRAVENOUS | Status: AC
  Administered 2022-06-16: 1000 mL via INTRAVENOUS

## 2022-06-16 MED ORDER — IOHEXOL 300 MG/ML  SOLN
100.0000 mL | Freq: Once | INTRAMUSCULAR | Status: AC | PRN
  Administered 2022-06-16: 100 mL via INTRAVENOUS

## 2022-06-16 NOTE — ED Notes (Signed)
Pt states she can't urinate at this time- pt given labelled UA cup and wipe and instructed on use for sample. Pt verbalizes understanding.

## 2022-06-16 NOTE — Discharge Instructions (Addendum)
Please take your pain medication as needed but only as prescribed.  Please follow-up with your OB/GYN for recheck/reevaluation soon as you are able.  Return to the emergency department for any symptom personally concerning to yourself.

## 2022-06-16 NOTE — ED Notes (Signed)
Patient taken to CT scan.

## 2022-06-16 NOTE — ED Notes (Signed)
Dr. Kerman Passey aware.

## 2022-06-16 NOTE — ED Triage Notes (Signed)
Pt comes with c/o pelvic pain. Pt states she just had treatment of endometriosis and eval of pelvis pain surgery. Pt states she is now having more pain.

## 2022-06-16 NOTE — ED Provider Notes (Signed)
[facility] Provider Note    Event Date/Time   First MD Initiated Contact with Patient 06/16/22 1720     (approximate)  History   Chief Complaint: Post-op Problem  HPI  Pamela Booker is a 27 y.o. female with a past medical history of anxiety, epilepsy, endometriosis who presents to the emergency department for worsening abdominal pain 9 days after her surgery.  According to the patient 9 days ago she had a surgery for endometriosis to remove the infected tissue from the abdomen.  Patient states this is her fifth endometriosis surgery that she has had.  Patient states the pain seem to have been improving however over the last 2 to 3 days the pain has been worsening and has now become significant.  Patient states she tried calling her OB/GYN but they were not able to see her so she came to the emergency department for evaluation.  Patient denies any fever.  States she has had 2 bowel movements hoping this would relieve the pain but she states if anything the pain is worse.  Patient denies any bloody stool.  Denies any vaginal bleeding or discharge.  No urinary symptoms.  Physical Exam   Triage Vital Signs: ED Triage Vitals  Enc Vitals Group     BP 06/16/22 1551 (!) 128/92     Pulse Rate 06/16/22 1551 91     Resp 06/16/22 1551 18     Temp 06/16/22 1551 98.2 F (36.8 C)     Temp Source 06/16/22 1551 Oral     SpO2 06/16/22 1551 99 %     Weight --      Height --      Head Circumference --      Peak Flow --      Pain Score 06/16/22 1548 7     Pain Loc --      Pain Edu? --      Excl. in [place]? --     Most recent vital signs: Vitals:   06/16/22 1551 06/16/22 1720  BP: (!) 128/92 125/70  Pulse: 91 84  Resp: 18 18  Temp: 98.2 F (36.8 C)   SpO2: 99% 100%    General: Awake, no distress.  CV:  Good peripheral perfusion.  Regular rate and rhythm  Resp:  Normal effort.  Equal breath sounds bilaterally.  Abd:  No distention.  Soft, mild mid  abdominal tenderness to palpation with no rebound or guarding.   ED Results / Procedures / Treatments   RADIOLOGY  I have reviewed and the CT images.  No obvious abnormality such as obstruction seen on my evaluation. Radiology is read the CT scan essentially negative.   MEDICATIONS ORDERED IN ED: Medications  morphine (PF) 4 MG/ML injection 4 mg (has no administration in time range)  ondansetron (ZOFRAN) injection 4 mg (has no administration in time range)  sodium chloride 0.9 % bolus 1,000 mL (has no administration in time range)     IMPRESSION / MDM / ASSESSMENT AND PLAN / ED COURSE  I reviewed the triage vital signs and the nursing notes.  Patient's presentation is most consistent with acute presentation with potential threat to life or bodily function.  Patient presents emergency department for abdominal pain 9 days after her endometriosis surgery.  Overall the patient appears well, no distress.  Patient's lab work is reassuring occluding normal CBC with a normal white blood cell count, normal chemistry negative lipase normal urinalysis.  Patient does have mild mid abdominal  tenderness.  Patient describes moderate pain that is worsening.  Given the patient's worsening pain and tenderness to palpation we will obtain CT imaging the abdomen/pelvis.   Will dose pain and nausea medication, IV hydrate and obtain CT imaging.  If the CT does not show any significant findings such as postoperative bleed, abscess, etc. I believe the patient could be discharged home with outpatient follow-up with her OB/GYN.  Patient agreeable to plan of care.  Patient CT scan shows no concerning findings.  Lab work shows a normal CBC, normal chemistry, normal urinalysis.  We will discharge with very short course of pain medication have the patient follow-up with her OB/GYN.  Patient agreeable to plan of care.  FINAL CLINICAL IMPRESSION(S) / ED DIAGNOSES   Postoperative pain    Note:  This document was  prepared using Dragon voice recognition software and may include unintentional dictation errors.   Harvest Dark, MD 06/16/22 1905

## 2022-07-11 DIAGNOSIS — G40219 Localization-related (focal) (partial) symptomatic epilepsy and epileptic syndromes with complex partial seizures, intractable, without status epilepticus: Secondary | ICD-10-CM | POA: Diagnosis not present

## 2022-07-11 DIAGNOSIS — Z9689 Presence of other specified functional implants: Secondary | ICD-10-CM | POA: Diagnosis not present

## 2022-07-11 DIAGNOSIS — Z6827 Body mass index (BMI) 27.0-27.9, adult: Secondary | ICD-10-CM | POA: Diagnosis not present

## 2022-07-17 DIAGNOSIS — F419 Anxiety disorder, unspecified: Secondary | ICD-10-CM | POA: Insufficient documentation

## 2022-07-17 HISTORY — DX: Anxiety disorder, unspecified: F41.9

## 2022-07-19 DIAGNOSIS — I10 Essential (primary) hypertension: Secondary | ICD-10-CM | POA: Diagnosis not present

## 2022-07-19 DIAGNOSIS — G40909 Epilepsy, unspecified, not intractable, without status epilepticus: Secondary | ICD-10-CM | POA: Diagnosis not present

## 2022-07-19 DIAGNOSIS — F419 Anxiety disorder, unspecified: Secondary | ICD-10-CM | POA: Diagnosis not present

## 2022-07-19 DIAGNOSIS — Z9689 Presence of other specified functional implants: Secondary | ICD-10-CM | POA: Diagnosis not present

## 2022-07-19 DIAGNOSIS — G40219 Localization-related (focal) (partial) symptomatic epilepsy and epileptic syndromes with complex partial seizures, intractable, without status epilepticus: Secondary | ICD-10-CM | POA: Diagnosis not present

## 2022-07-28 DIAGNOSIS — Z6827 Body mass index (BMI) 27.0-27.9, adult: Secondary | ICD-10-CM | POA: Diagnosis not present

## 2022-07-28 DIAGNOSIS — R635 Abnormal weight gain: Secondary | ICD-10-CM | POA: Diagnosis not present

## 2022-08-03 ENCOUNTER — Telehealth: Payer: 59 | Admitting: Family Medicine

## 2022-08-03 ENCOUNTER — Telehealth: Payer: 59 | Admitting: Nurse Practitioner

## 2022-08-03 ENCOUNTER — Encounter: Payer: Self-pay | Admitting: Family Medicine

## 2022-08-03 DIAGNOSIS — R112 Nausea with vomiting, unspecified: Secondary | ICD-10-CM | POA: Diagnosis not present

## 2022-08-03 DIAGNOSIS — E860 Dehydration: Secondary | ICD-10-CM | POA: Diagnosis not present

## 2022-08-03 DIAGNOSIS — R519 Headache, unspecified: Secondary | ICD-10-CM | POA: Diagnosis not present

## 2022-08-03 MED ORDER — ONDANSETRON 4 MG PO TBDP: 4.0000 mg | ORAL_TABLET | Freq: Three times a day (TID) | ORAL | 0 refills | Status: DC | PRN

## 2022-08-03 NOTE — Progress Notes (Signed)
Virtual Visit Consent   Pamela Booker, you are scheduled for a virtual visit with a [facility] provider today. Just as with appointments in the office, your consent must be obtained to participate. Your consent will be active for this visit and any virtual visit you may have with one of our providers in the next 365 days. If you have a MyChart account, a copy of this consent can be sent to you electronically.  As this is a virtual visit, video technology does not allow for your provider to perform a traditional examination. This may limit your provider's ability to fully assess your condition. If your provider identifies any concerns that need to be evaluated in person or the need to arrange testing (such as labs, EKG, etc.), we will make arrangements to do so. Although advances in technology are sophisticated, we cannot ensure that it will always work on either your end or our end. If the connection with a video visit is poor, the visit may have to be switched to a telephone visit. With either a video or telephone visit, we are not always able to ensure that we have a secure connection.  By engaging in this virtual visit, you consent to the provision of healthcare and authorize for your insurance to be billed (if applicable) for the services provided during this visit. Depending on your insurance coverage, you may receive a charge related to this service.  I need to obtain your verbal consent now. Are you willing to proceed with your visit today? Pamela Booker has provided verbal consent on 08/03/2022 for a virtual visit (video or telephone). Pamela Schneiders, FNP  Date: 08/03/2022 7:59 AM  Virtual Visit via Video Note   I, Pamela Booker, connected with  Pamela Booker  ([id number], 01/24/1996) on 08/03/22 at  8:15 AM EST by a video-enabled telemedicine application and verified that I am speaking with the correct person using two identifiers.  Location: Patient: Virtual Visit  Location Patient: Home Provider: Virtual Visit Location Provider: Home Office   I discussed the limitations of evaluation and management by telemedicine and the availability of in person appointments. The patient expressed understanding and agreed to proceed.    History of Present Illness: Pamela Booker is a 27 y.o. who identifies as a female who was assigned female at birth, and is being seen today for headache, nausea and vomiting. Symptom onset was yesterday morning  She started vomiting with diarrhea throughout the day, after trying to eat last night she vomited again.  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   Epilepsy ([place]) 07/05/2021   Migraine headache 07/05/2021   Essential hypertension 07/05/2021   Seizure ([place]) 07/05/2021   Irregular intermenstrual bleeding 06/13/2018   Vaginal dryness 06/06/2018   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   PVC (premature ventricular contraction) 03/11/2018   Endometriosis 03/14/2017   Atypical chest pain 04/25/2016   Palpitations 04/25/2016   Shortness of breath 04/25/2016  Chronic migraine without aura without status migrainosus, not intractable 12/11/2014    Allergies:  Allergies  Allergen Reactions   Nsaids Other (See Comments)    History of bleeding ulcers   Benadryl [Diphenhydramine] Other (See Comments)    "Crawling feeling under skin"   Compazine [Prochlorperazine]    Ibuprofen     Other reaction(s): GI Upset (intolerance)   Keppra  [Levetiracetam] Other (See Comments)    "Makes her feel hungover"    Zolpidem Other (See Comments)    seizure   Emgality [Galcanezumab-Gnlm] Rash   Medications:  Current Outpatient Medications:    acebutolol (SECTRAL) 200 MG capsule, TAKE ONE CAPSULE BY MOUTH TWICE A DAY DISCONTINUE VERAPAMIL (Patient taking differently: Take 200 mg by mouth 2 (two) times daily. TAKE ONE CAPSULE BY MOUTH TWICE A DAY DISCONTINUE VERAPAMIL), Disp: 60 capsule, Rfl: 3   acetaminophen (TYLENOL) 325 MG tablet, Take by mouth., Disp: , Rfl:    benzonatate (TESSALON) 100 MG capsule, Take 1-2 tablets 3 times a day as needed for cough, Disp: 30 capsule, Rfl: 0   buPROPion (WELLBUTRIN XL) 300 MG 24 hr tablet, Take 300 mg by mouth every morning., Disp: , Rfl:    diazepam (VALIUM) 5 MG tablet, Take 5 mg by mouth 2 (two) times daily as needed for muscle spasms., Disp: , Rfl:    diazepam (VALIUM) 5 MG tablet, Take by mouth., Disp: , Rfl:    Docusate Sodium (DSS) 100 MG CAPS, Take by mouth., Disp: , Rfl:    escitalopram (LEXAPRO) 20 MG tablet, Take 20 mg by mouth daily. , Disp: , Rfl:    estradiol (VIVELLE-DOT) 0.075 MG/24HR, Place 1 patch onto the skin as needed (period bleeding)., Disp: , Rfl:    fluticasone (FLONASE) 50 MCG/ACT nasal spray, Place 2 sprays into both nostrils daily., Disp: 16 g, Rfl: 0   HYDROcodone-acetaminophen (NORCO/VICODIN) 5-325 MG tablet, Take 1 tablet by mouth every 4 (four) hours as needed., Disp: 10 tablet, Rfl: 0   lamoTRIgine (LAMICTAL) 25 MG tablet, PLEASE SEE ATTACHED FOR DETAILED DIRECTIONS, Disp: , Rfl:    levETIRAcetam (KEPPRA) 500 MG tablet, Take 1 tablet (500 mg total) by mouth 2 (two) times daily., Disp: 60 tablet, Rfl: 0   lidocaine (XYLOCAINE) 2 % solution, Use as directed 15 mLs in the mouth or throat as needed for mouth pain., Disp: 100 mL, Rfl: 0   LORazepam (ATIVAN) 1 MG tablet, Take 1 mg by mouth every 6 (six) hours as needed., Disp: , Rfl:    NAYZILAM 5 MG/0.1ML SOLN, Place into  both nostrils daily., Disp: , Rfl:    norethindrone (AYGESTIN) 5 MG tablet, Take 5 mg by mouth daily., Disp: , Rfl:    NUVARING 0.12-0.015 MG/24HR vaginal ring, SMARTSIG:ring Vaginal Every 3 Weeks, Disp: , Rfl:    ondansetron (ZOFRAN) 4 MG tablet, Take 4 mg by mouth 3 (three) times daily as needed for nausea/vomiting., Disp: , Rfl:    phentermine 30 MG capsule, Take 30 mg by mouth every morning., Disp: , Rfl:    PHEXXI 1.8-1-0.4 % GEL, SMARTSIG:1 Application Vaginal Once PRN, Disp: , Rfl:    PRESCRIPTION MEDICATION, Apply 1 application topically every other day. Apply to face at night - Hers 20 ml Acne ( pump) Az/Zp (0.0125%), Disp: , Rfl:    rizatriptan (MAXALT-MLT) 10 MG disintegrating tablet, Take 10 mg by mouth daily as needed for migraine. , Disp: , Rfl:    sertraline (ZOLOFT) 100 MG tablet, Take 100 mg by mouth daily., Disp: ,  Rfl:    tiZANidine (ZANAFLEX) 4 MG tablet, Take 4-8 mg by mouth as needed for muscle spasms., Disp: , Rfl:    topiramate (TOPAMAX) 100 MG tablet, Take 100 mg by mouth daily., Disp: , Rfl:    valACYclovir (VALTREX) 500 MG tablet, Take 500 mg by mouth daily., Disp: , Rfl:    XCOPRI 14 x 150 MG & 14 x200 MG TBPK, Take 200 mg by mouth at bedtime., Disp: , Rfl:    zolpidem (AMBIEN) 10 MG tablet, Take 10 mg by mouth at bedtime., Disp: , Rfl:   Observations/Objective: Patient is well-developed, well-nourished in no acute distress.  Resting comfortably  at home.  Head is normocephalic, atraumatic.  No labored breathing.  Speech is clear and coherent with logical content.  Patient is alert and oriented at baseline.    Assessment and Plan: 1. Nausea and vomiting, unspecified vomiting type  - ondansetron (ZOFRAN-ODT) 4 MG disintegrating tablet; Take 1 tablet (4 mg total) by mouth every 8 (eight) hours as needed for nausea or vomiting.  Dispense: 20 tablet; Refill: 0    Push fluids, monitor hydration  Start bland diet once fluids are tolerated and urine is clear/pale  yellow  If unable to keep fluids down for the next 6 hours seek medical care for hydration as discussed     Follow Up Instructions: I discussed the assessment and treatment plan with the patient. The patient was provided an opportunity to ask questions and all were answered. The patient agreed with the plan and demonstrated an understanding of the instructions.  A copy of instructions were sent to the patient via MyChart unless otherwise noted below.    The patient was advised to call back or seek an in-person evaluation if the symptoms worsen or if the condition fails to improve as anticipated.  Time:  I spent 10 minutes with the patient via telehealth technology discussing the above problems/concerns.    Pamela Schneiders, FNP

## 2022-08-03 NOTE — Progress Notes (Signed)
[place]   Needs in person IVF- advised to be seen as soon as possible for this- Seen note in Epic from this mornings visit.  Patient acknowledged agreement and understanding of the plan.

## 2022-08-29 DIAGNOSIS — S63601A Unspecified sprain of right thumb, initial encounter: Secondary | ICD-10-CM | POA: Diagnosis not present

## 2022-09-04 DIAGNOSIS — F4312 Post-traumatic stress disorder, chronic: Secondary | ICD-10-CM | POA: Diagnosis not present

## 2022-10-05 IMAGING — CT CT HEAD W/O CM
4 series · 16 of 47 positions shown, 18 images · non-contrast
Comparison: None.

CLINICAL DATA: Head trauma, focal neuro findings (Age 18-64y)



[Series 3: head wo · axial · 0.41mm/px · z∈[+952,+1072]mm · 7 of 32 slices shown, 9 images]
[im 4/32  brain]
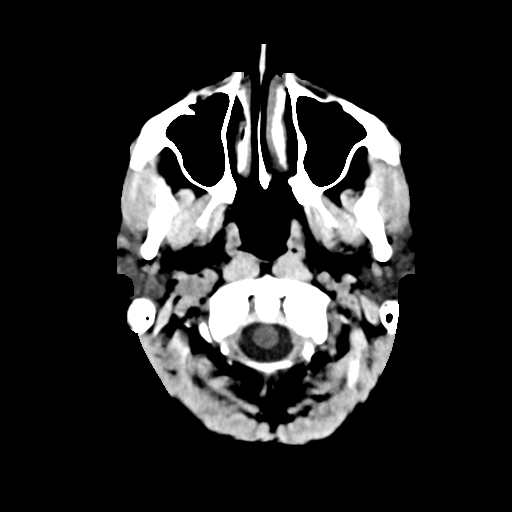
[im 4/32  bone]
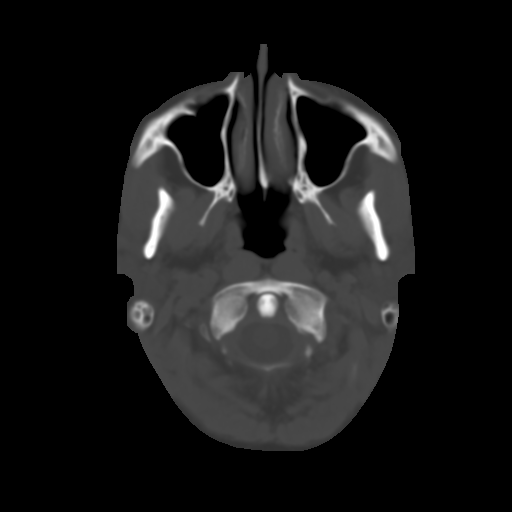
[im 8/32  brain]
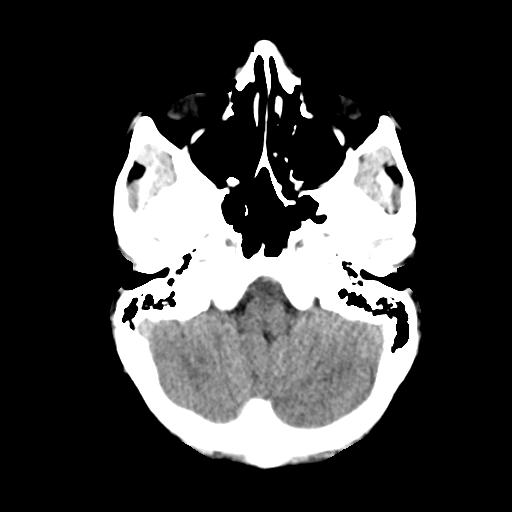
[im 12/32  brain]
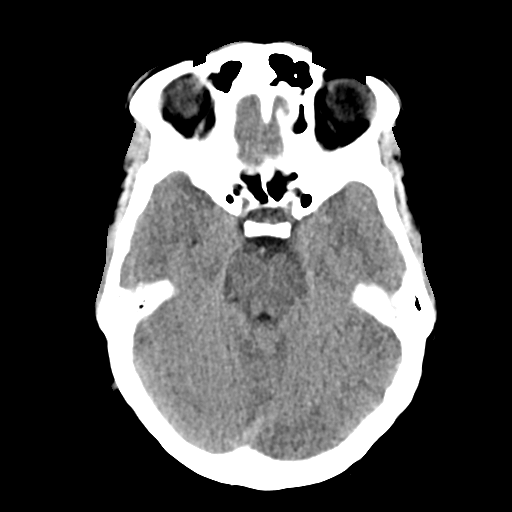
[im 16/32  brain]
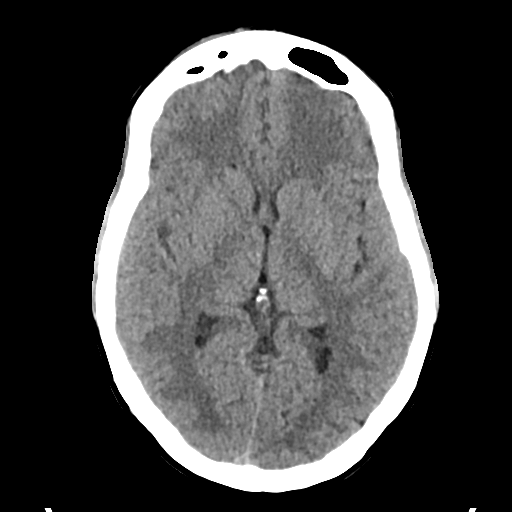
[im 20/32  brain]
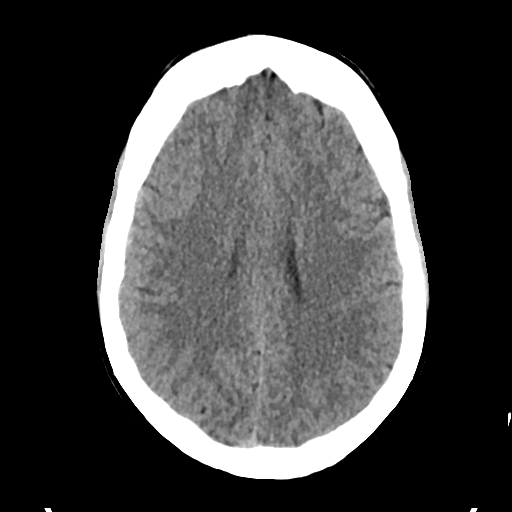
[im 20/32  bone]
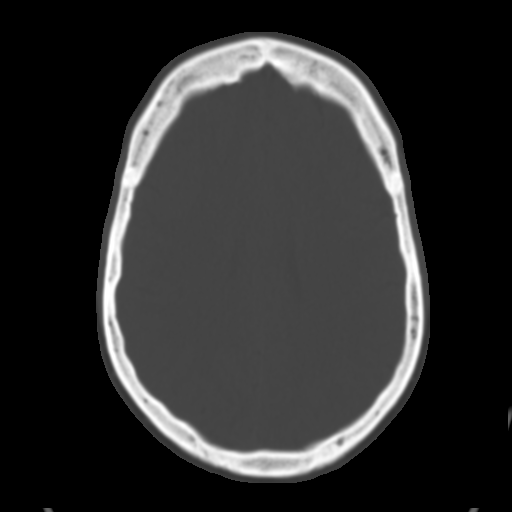
[im 24/32  brain]
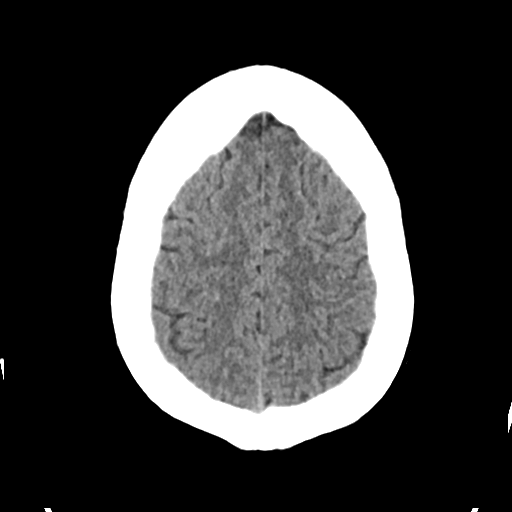
[im 28/32  brain]
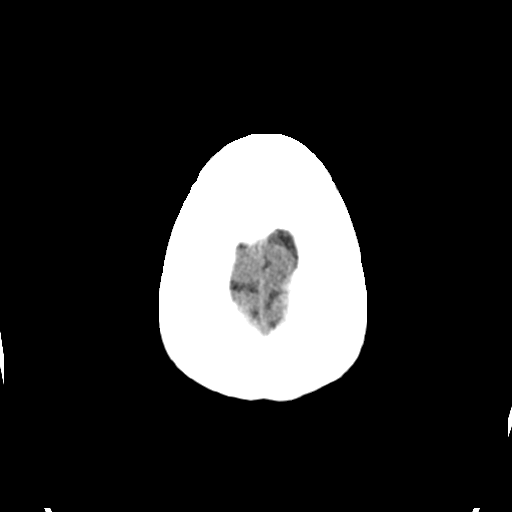

[Series 4: head bone · axial · 0.41mm/px · z∈[+950,+982]mm · 3 of 78 slices shown]
[im 8/78  bone]
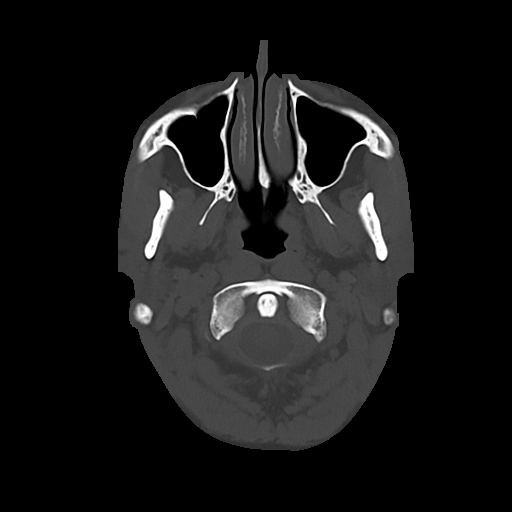
[im 16/78  bone]
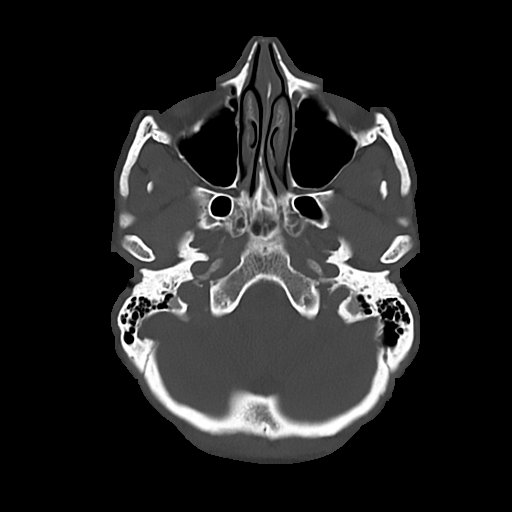
[im 24/78  bone]
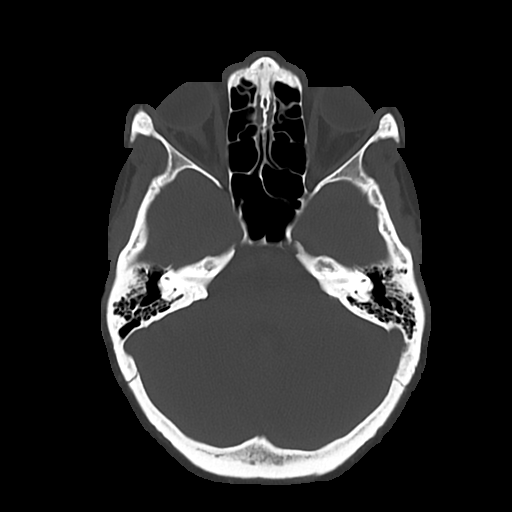

[Series 5: cor soft · coronal · 0.30mm/px · 3 of 69 slices shown]
[im 23/69  brain]
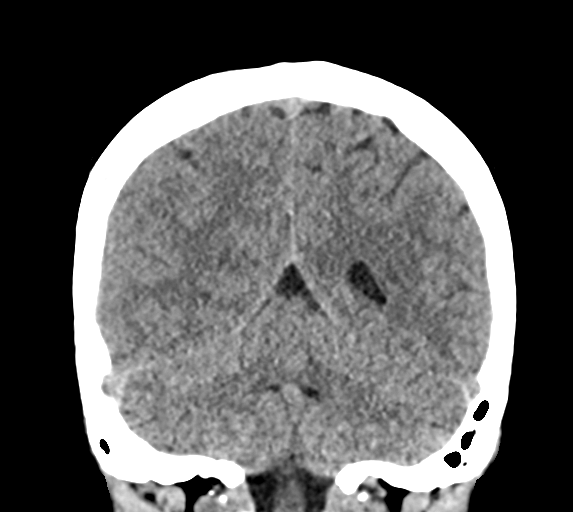
[im 31/69  brain]
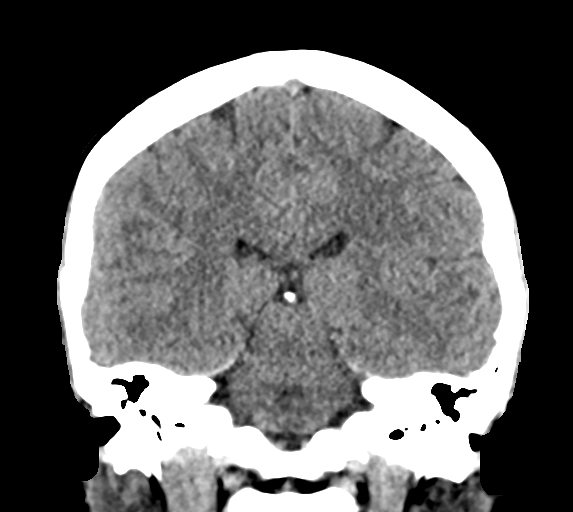
[im 38/69  brain]
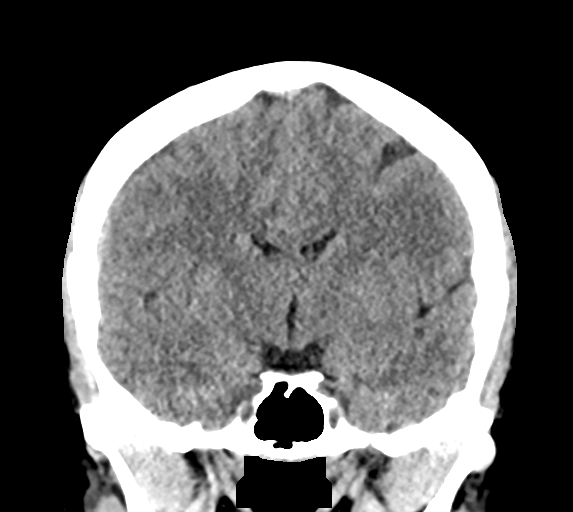

[Series 6: sag soft · sagittal · 0.34mm/px · 3 of 55 slices shown]
[im 19/55  brain]
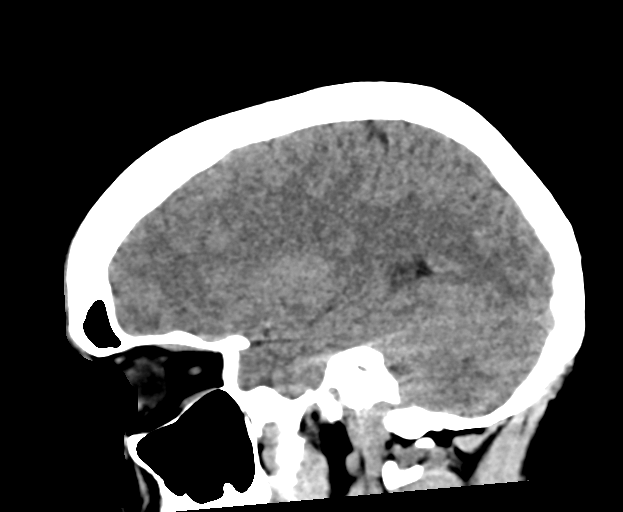
[im 28/55  brain]
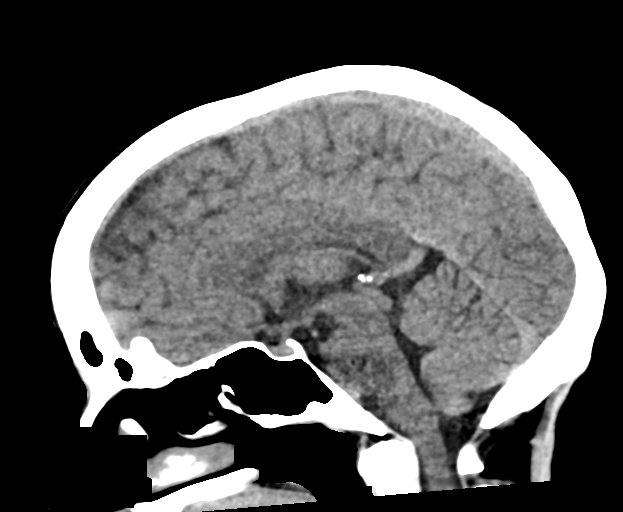
[im 37/55  brain]
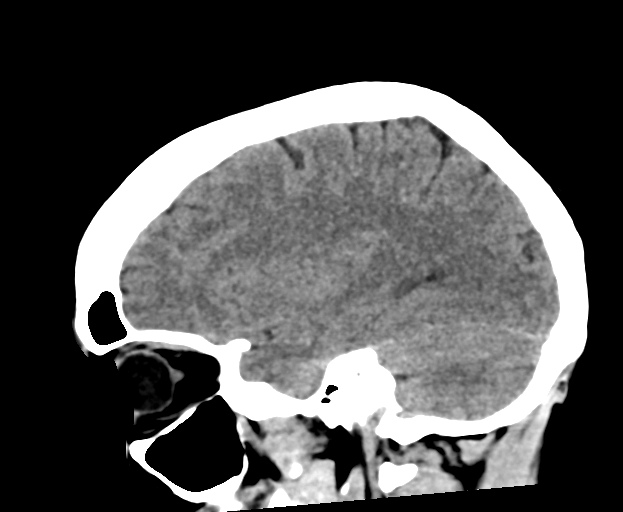

[16 of 47 positions shown; findings below may reference images not displayed]

FINDINGS: Brain:

No evidence of large-territorial acute infarction. No parenchymal
hemorrhage. No mass lesion. No extra-axial collection.

No mass effect or midline shift. No hydrocephalus. Basilar cisterns
are patent.

Vascular: No hyperdense vessel.

Skull: No acute fracture or focal lesion.

Sinuses/Orbits: Paranasal sinuses and mastoid air cells are clear.
The orbits are unremarkable.

Other: None.
IMPRESSION: Negative for acute traumatic injury.

## 2022-10-16 DIAGNOSIS — Z9889 Other specified postprocedural states: Secondary | ICD-10-CM | POA: Diagnosis not present

## 2022-10-16 DIAGNOSIS — N809 Endometriosis, unspecified: Secondary | ICD-10-CM | POA: Diagnosis not present

## 2022-10-16 DIAGNOSIS — F3281 Premenstrual dysphoric disorder: Secondary | ICD-10-CM | POA: Diagnosis not present

## 2022-10-16 DIAGNOSIS — Z0000 Encounter for general adult medical examination without abnormal findings: Secondary | ICD-10-CM | POA: Diagnosis not present

## 2022-10-16 DIAGNOSIS — E041 Nontoxic single thyroid nodule: Secondary | ICD-10-CM | POA: Diagnosis not present

## 2022-10-16 DIAGNOSIS — Z01419 Encounter for gynecological examination (general) (routine) without abnormal findings: Secondary | ICD-10-CM | POA: Diagnosis not present

## 2022-10-26 DIAGNOSIS — R1030 Lower abdominal pain, unspecified: Secondary | ICD-10-CM | POA: Diagnosis not present

## 2022-10-26 DIAGNOSIS — R8290 Unspecified abnormal findings in urine: Secondary | ICD-10-CM | POA: Diagnosis not present

## 2022-10-30 ENCOUNTER — Other Ambulatory Visit: Payer: Self-pay

## 2022-10-30 ENCOUNTER — Ambulatory Visit: Admit: 2022-10-30 | Payer: 59

## 2022-10-30 ENCOUNTER — Emergency Department
Admission: EM | Admit: 2022-10-30 | Discharge: 2022-10-30 | Disposition: A | Discharge status: Home / Self Care | Payer: 59 | Attending: Emergency Medicine | Admitting: Emergency Medicine

## 2022-10-30 ENCOUNTER — Encounter: Payer: Self-pay | Admitting: Emergency Medicine

## 2022-10-30 ENCOUNTER — Emergency Department: Payer: 59

## 2022-10-30 DIAGNOSIS — I10 Essential (primary) hypertension: Secondary | ICD-10-CM | POA: Insufficient documentation

## 2022-10-30 DIAGNOSIS — M545 Low back pain, unspecified: Secondary | ICD-10-CM

## 2022-10-30 DIAGNOSIS — N200 Calculus of kidney: Secondary | ICD-10-CM | POA: Diagnosis not present

## 2022-10-30 LAB — BASIC METABOLIC PANEL
Anion gap: 8 (ref 5–15)
BUN: 6 mg/dL (ref 6–20)
CO2: 25 mmol/L (ref 22–32)
Calcium: 10.1 mg/dL (ref 8.9–10.3)
Chloride: 105 mmol/L (ref 98–111)
Creatinine, Ser: 0.68 mg/dL (ref 0.44–1.00)
GFR, Estimated: >60 mL/min (ref >60)
Glucose, Bld: 85 mg/dL (ref 70–99)
Potassium: 3.7 mmol/L (ref 3.5–5.1)
Sodium: 138 mmol/L (ref 135–145)

## 2022-10-30 LAB — CBC
HCT: 42.9 % (ref 36.0–46.0)
Hemoglobin: 14.5 g/dL (ref 12.0–15.0)
MCH: 32.4 pg (ref 26.0–34.0)
MCHC: 33.8 g/dL (ref 30.0–36.0)
MCV: 95.8 fL (ref 80.0–100.0)
Platelets: 275 10*3/uL (ref 150–400)
RBC: 4.48 MIL/uL (ref 3.87–5.11)
RDW: 12.4 % (ref 11.5–15.5)
WBC: 8.4 10*3/uL (ref 4.0–10.5)
nRBC: 0 % (ref 0.0–0.2)

## 2022-10-30 LAB — URINALYSIS, ROUTINE W REFLEX MICROSCOPIC
Bilirubin Urine: NEGATIVE
Glucose, UA: 150 mg/dL — AB
Hgb urine dipstick: NEGATIVE
Ketones, ur: NEGATIVE mg/dL
Leukocytes,Ua: NEGATIVE
Nitrite: NEGATIVE
Protein, ur: NEGATIVE mg/dL
Specific Gravity, Urine: 1.011 (ref 1.005–1.030)
pH: 7 (ref 5.0–8.0)

## 2022-10-30 LAB — POC URINE PREG, ED: Preg Test, Ur: NEGATIVE

## 2022-10-30 MED ORDER — ONDANSETRON HCL 4 MG/2ML IJ SOLN
4.0000 mg | Freq: Once | INTRAMUSCULAR | Status: AC
  Administered 2022-10-30: 4 mg via INTRAVENOUS
  Filled 2022-10-30: qty 2

## 2022-10-30 NOTE — ED Notes (Signed)
Patient states nausea is intermittent. Patient appears comfortable at this time.

## 2022-10-30 NOTE — Discharge Instructions (Signed)
You have stones in bilateral kidneys, though none of them are in your ureters.  Your urinalysis does not reveal evidence of infection.  Your blood work is normal.  Please follow-up with your outpatient provider.  You may also follow-up with urology.  Please return for any new, worsening, or change in symptoms or other concerns.  It was a pleasure caring for you today.

## 2022-10-30 NOTE — ED Provider Notes (Signed)
[facility] Provider Note    Event Date/Time   First MD Initiated Contact with Patient 10/30/22 1749     (approximate)   History   Back Pain   HPI  Pamela Booker is a 27 y.o. female with a past medical history of endometriosis, depression, hypertension, seizure disorder who presents today for evaluation of bilateral back pain.  Patient reports that she has had pain in her bilateral low back for the past 2 weeks, and just passed a kidney stone.  However, she reports that she still has pain.  She denies burning with urination or urinary urgency.  She reports that she is not have any vaginal discharge or bleeding.  She is not concerned for STDs.  She is nauseated but has not had any vomiting.  She denies abdominal pain.  No fevers or chills.  She wants to be sure that she does not have any more kidney stones.  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   PVC (premature ventricular contraction) 03/11/2018   Endometriosis 03/14/2017   Atypical chest pain 04/25/2016   Palpitations 04/25/2016   Shortness of breath 04/25/2016   Chronic migraine without aura without status migrainosus, not intractable 12/11/2014          Physical Exam   Triage Vital Signs: ED Triage Vitals  Enc Vitals Group     BP 10/30/22 1721 139/86     Pulse Rate 10/30/22 1721 84     Resp 10/30/22 1721 18     Temp 10/30/22 1721 98.9 F (37.2 C)     Temp Source 10/30/22 1721 Oral     SpO2 10/30/22 1721 97 %     Weight 10/30/22 1750 139 lb 15.9 oz (63.5 kg)     Height 10/30/22 1750 5' (1.524 m)     Head Circumference  --      Peak Flow --      Pain Score 10/30/22 1721 6     Pain Loc --      Pain Edu? --      Excl. in GC? --     Most recent vital signs: Vitals:   10/30/22 1721  BP: 139/86  Pulse: 84  Resp: 18  Temp: 98.9 F (37.2 C)  SpO2: 97%    Physical Exam Vitals and nursing note reviewed.  Constitutional:      General: Awake and alert. No acute distress.    Appearance: Normal appearance. The patient is overweight.  HENT:     Head: Normocephalic and atraumatic.     Mouth: Mucous membranes are moist.  Eyes:     General: PERRL. Normal EOMs        Right eye: No discharge.        Left eye: No discharge.     Conjunctiva/sclera: Conjunctivae normal.  Cardiovascular:     Rate and Rhythm: Normal rate and regular rhythm.     Pulses: Normal pulses.     Heart sounds: Normal heart sounds Pulmonary:     Effort: Pulmonary effort is normal. No respiratory distress.     Breath sounds: Normal breath sounds.  Abdominal:  Abdomen is soft. There is no abdominal tenderness. No rebound or guarding. No distention.  Mild tenderness to palpation to lumbar paraspinal muscles without CVA tenderness Musculoskeletal:        General: No swelling. Normal range of motion.     Cervical back: Normal range of motion and neck supple.  Skin:    General: Skin is warm and dry.     Capillary Refill: Capillary refill takes less than 2 seconds.     Findings: No rash.  Neurological:     Mental Status: The patient is awake and alert.      ED Results / Procedures / Treatments   Labs (all labs ordered are listed, but only abnormal results are displayed) Labs Reviewed  URINALYSIS, ROUTINE W REFLEX MICROSCOPIC - Abnormal; Notable for the following components:      Result Value   Color, Urine YELLOW (*)    APPearance CLEAR (*)    Glucose, UA 150 (*)    All other components within normal limits  BASIC METABOLIC PANEL  CBC  POC URINE PREG, ED     EKG     RADIOLOGY I independently reviewed and  interpreted imaging and agree with radiologists findings.     PROCEDURES:  Critical Care performed:   Procedures   MEDICATIONS ORDERED IN ED: Medications  ondansetron (ZOFRAN) injection 4 mg (4 mg Intravenous Given 10/30/22 1820)     IMPRESSION / MDM / ASSESSMENT AND PLAN / ED COURSE  I reviewed the triage vital signs and the nursing notes.   Differential diagnosis includes, but is not limited to, urinary tract infection, pyelonephritis, lumbar strain, nephrolithiasis, ureteral colic.  Patient is awake and alert, hemodynamically stable and afebrile.  Her abdomen is soft and nontender throughout.  She denies any concern for STDs and does not wish to have STD testing today.  She has tenderness to palpation to her lumbar paraspinal muscles, no midline vertebral tenderness.  She has 5 out of 5 strength with intact sensation to extensor hallucis dorsiflexion and plantarflexion of bilateral lower extremities with normal patellar reflexes bilaterally. No major trauma, no midline tenderness, no history or physical exam findings to suggest cauda equina syndrome or spinal cord compression. No focal neurological deficits on exam. No constitutional symptoms or history of immunosuppression or IVDA to suggest potential for epidural abscess. Not anticoagulated, no history of bleeding diastasis to suggest risk for epidural hematoma. No chronic steroid use or advanced age or history of malignancy to suggest proclivity towards pathological fracture.  No chest pain, back pain, shortness of breath, neurological deficits, to suggest vascular catastrophe, and pulses are equal in all 4 extremities.  No vaginal discharge to suggest STD, no pelvic pain to suggest PID or TOA.  Given her history of kidney stones and her recently passed stone, further workup is indicated.  Labs obtained in triage are overall reassuring, no leukocytosis, normal creatinine.  No abdominal pain to suggest intra-abdominal etiology, or  appendicitis.  Urinalysis obtained is negative for evidence of infection.  No CVA tenderness or fever to suggest pyelonephritis, and UA is negative.  CT renal stone obtained given her flank pain.  CT reveals bilateral stones within the kidneys, though no ureteral stones.  It is possible that patient has musculoskeletal etiology, or continued ureteral spasms given her recently passed stone.  Patient is reassured by her findings.  We discussed symptomatic management and return precautions.  Recommended close outpatient follow-up for re-evaluation. Patient agrees with plan of care. Educated patient regarding expected time  course for back pain to improve and recommended very close outpatient follow-up.  Patient understands and agrees with plan.  She was discharged in stable condition.  She is ambulatory with a steady gait.   Patient's presentation is most consistent with acute complicated illness / injury requiring diagnostic workup.      FINAL CLINICAL IMPRESSION(S) / ED DIAGNOSES   Final diagnoses:  Bilateral kidney stones  Acute bilateral low back pain without sciatica     Rx / DC Orders   ED Discharge Orders     None        Note:  This document was prepared using Dragon voice recognition software and may include unintentional dictation errors.   Keturah Shavers 10/30/22 1846    Georga Hacking, MD 10/30/22 2025

## 2022-10-30 NOTE — ED Notes (Signed)
Patient declined discharge vital signs. 

## 2022-10-30 NOTE — ED Triage Notes (Signed)
Patient to ED via [facility] for lower back pain and radiates into lower abd. States she past a kidney stone on Friday. Bilateral pain- was treated for UTI 2 weeks ago.

## 2022-11-07 DIAGNOSIS — E041 Nontoxic single thyroid nodule: Secondary | ICD-10-CM | POA: Diagnosis not present

## 2022-11-14 DIAGNOSIS — E041 Nontoxic single thyroid nodule: Secondary | ICD-10-CM | POA: Diagnosis not present

## 2022-11-23 ENCOUNTER — Ambulatory Visit: Admission: EM | Admit: 2022-11-23 | Payer: 59 | Source: Home / Self Care

## 2022-11-23 DIAGNOSIS — Z886 Allergy status to analgesic agent status: Secondary | ICD-10-CM | POA: Diagnosis not present

## 2022-11-23 DIAGNOSIS — D72829 Elevated white blood cell count, unspecified: Secondary | ICD-10-CM | POA: Diagnosis not present

## 2022-11-23 DIAGNOSIS — R569 Unspecified convulsions: Secondary | ICD-10-CM | POA: Diagnosis not present

## 2022-11-23 DIAGNOSIS — Z79899 Other long term (current) drug therapy: Secondary | ICD-10-CM | POA: Diagnosis not present

## 2022-11-23 DIAGNOSIS — N200 Calculus of kidney: Secondary | ICD-10-CM | POA: Diagnosis not present

## 2022-11-23 DIAGNOSIS — E860 Dehydration: Secondary | ICD-10-CM | POA: Diagnosis not present

## 2022-11-23 DIAGNOSIS — R112 Nausea with vomiting, unspecified: Secondary | ICD-10-CM

## 2022-11-23 DIAGNOSIS — R197 Diarrhea, unspecified: Secondary | ICD-10-CM | POA: Diagnosis not present

## 2022-11-23 DIAGNOSIS — R109 Unspecified abdominal pain: Secondary | ICD-10-CM | POA: Diagnosis not present

## 2022-11-23 HISTORY — DX: Nausea with vomiting, unspecified: R11.2

## 2022-11-29 ENCOUNTER — Encounter: Payer: Self-pay | Admitting: Urology

## 2022-11-29 ENCOUNTER — Ambulatory Visit (INDEPENDENT_AMBULATORY_CARE_PROVIDER_SITE_OTHER): Payer: 59 | Admitting: Urology

## 2022-11-29 VITALS — BP 113/77 | HR 67 | Ht 60.0 in | Wt 142.5 lb

## 2022-11-29 DIAGNOSIS — R8290 Unspecified abnormal findings in urine: Secondary | ICD-10-CM | POA: Diagnosis not present

## 2022-11-29 DIAGNOSIS — N200 Calculus of kidney: Secondary | ICD-10-CM | POA: Diagnosis not present

## 2022-11-29 DIAGNOSIS — R109 Unspecified abdominal pain: Secondary | ICD-10-CM

## 2022-11-29 LAB — URINALYSIS, COMPLETE
Bilirubin, UA: NEGATIVE
Leukocytes,UA: NEGATIVE
Nitrite, UA: NEGATIVE
Protein,UA: NEGATIVE
RBC, UA: NEGATIVE
Specific Gravity, UA: 1.02 (ref 1.005–1.030)
Urobilinogen, Ur: 0.2 mg/dL (ref 0.2–1.0)
pH, UA: 6 (ref 5.0–7.5)

## 2022-11-29 LAB — MICROSCOPIC EXAMINATION: Epithelial Cells (non renal): >10 /hpf — AB (ref 0–10)

## 2022-11-29 MED ORDER — TAMSULOSIN HCL 0.4 MG PO CAPS
0.4000 mg | ORAL_CAPSULE | Freq: Every day | ORAL | 0 refills | Status: DC
Start: 2022-11-29 — End: 2022-12-28

## 2022-11-29 NOTE — Patient Instructions (Signed)

## 2022-11-29 NOTE — Progress Notes (Signed)
I, Pamela Booker, acting as a Neurosurgeon for Pamela Altes, MD., have documented all relevant documentation on the behalf of Pamela Altes, MD, as directed by  Pamela Altes, MD while in the presence of Pamela Altes, MD.   11/29/2022 10:23 AM   [facility] 01/09/1996 [id number]   Chief Complaint  Patient presents with   Establish Care   Nephrolithiasis    HPI Pamela Booker is a 27 y.o. female presenting for follow up of recent ED visit for back pain.  Seen at [facility] ED 10/30/22 complaining of bilateral back pain and has passed a kidney stone 2 weeks prior to that visit. CT renal stone study was performed and showed bilateral, non-obstructing renal calculi. Long history of recurrent stone disease. Was seen at [facility] in [place] 4-5 years ago; has never required surgical intervention. With her last stone, she states she had a CT, which only showed renal calculi and was having pelvic pressure and urgency. She states 1-2 days after the CT, she passed a stone, which she brings in today, measuring approximately 5 mm. She does have persistent urinary urgency and sensation to void. Intermittent bilateral low back pain.   PMH: Past Medical History:  Diagnosis Date   Anxiety    Encounter for loop recorder check 06/10/2019   Epilepsy (HCC)    Hypertension    Loop: Biotronic loop recorder in situ 05/06/2019   Scheduled Remote loop recorder check 06/09/2019: This represents an unremarkable monitoring session. The presenting rhythm is not available. No symptomatic or automatic episodes were triggered. Daily activity trends are stable. Heart rate variability trends are stable.   Migraines    Palpitations    PVC (premature ventricular contraction)    PVC's (premature ventricular contractions)    Seizures (HCC)    Syncope and collapse 06/10/2019    Surgical History: Past Surgical History:  Procedure Laterality Date   LAPAROSCOPY     TONSILLECTOMY      VAGUS NERVE STIMULATOR INSERTION      Home Medications:  Allergies as of 11/29/2022       Reactions   Nsaids Other (See Comments)   History of bleeding ulcers   Benadryl [diphenhydramine] Other (See Comments)   "Crawling feeling under skin"   Compazine [prochlorperazine]    Ibuprofen    Other reaction(s): GI Upset (intolerance)   Keppra [levetiracetam] Other (See Comments)   "Makes her feel hungover"    Zolpidem Other (See Comments)   seizure   Emgality [galcanezumab-gnlm] Rash        Medication List        Accurate as of November 29, 2022 10:23 AM. If you have any questions, ask your nurse or doctor.          STOP taking these medications    benzonatate 100 MG capsule Commonly known as: TESSALON Stopped by: Pamela Altes, MD   buPROPion 300 MG 24 hr tablet Commonly known as: WELLBUTRIN XL Stopped by: Pamela Altes, MD   DSS 100 MG Caps Stopped by: Pamela Altes, MD   estradiol 0.075 MG/24HR Commonly known as: VIVELLE-DOT Stopped by: Pamela Altes, MD   HYDROcodone-acetaminophen 5-325 MG tablet Commonly known as: NORCO/VICODIN Stopped by: Pamela Altes, MD   lamoTRIgine 25 MG tablet Commonly known as: LAMICTAL Stopped by: Pamela Altes, MD   levETIRAcetam 500 MG tablet Commonly known as: Keppra Stopped by: Pamela Altes, MD   lidocaine 2 % solution Commonly known  as: XYLOCAINE Stopped by: Pamela Altes, MD   LORazepam 1 MG tablet Commonly known as: ATIVAN Stopped by: Pamela Altes, MD   Nayzilam 5 MG/0.1ML Soln Generic drug: Midazolam Stopped by: Pamela Altes, MD   norethindrone 5 MG tablet Commonly known as: AYGESTIN Stopped by: Pamela Altes, MD   NuvaRing 0.12-0.015 MG/24HR vaginal ring Generic drug: etonogestrel-ethinyl estradiol Stopped by: Pamela Altes, MD   omeprazole 20 MG capsule Commonly known as: PRILOSEC Stopped by: Pamela Altes, MD   ondansetron 4 MG tablet Commonly known as:  ZOFRAN Stopped by: Pamela Altes, MD   PRESCRIPTION MEDICATION Stopped by: Pamela Altes, MD   sertraline 100 MG tablet Commonly known as: ZOLOFT Stopped by: Pamela Altes, MD   Xcopri 14 x 150 MG & 14 x200 MG Tbpk Generic drug: Cenobamate Stopped by: Pamela Altes, MD   zolpidem 10 MG tablet Commonly known as: AMBIEN Stopped by: Pamela Altes, MD       TAKE these medications    acebutolol 200 MG capsule Commonly known as: SECTRAL TAKE ONE CAPSULE BY MOUTH TWICE A DAY DISCONTINUE VERAPAMIL What changed:  how much to take how to take this when to take this   acetaminophen 325 MG tablet Commonly known as: TYLENOL Take by mouth.   diazepam 5 MG tablet Commonly known as: VALIUM Take 5 mg by mouth 2 (two) times daily as needed for muscle spasms.   diazepam 5 MG tablet Commonly known as: VALIUM Take by mouth.   escitalopram 20 MG tablet Commonly known as: LEXAPRO Take 20 mg by mouth daily.   fluticasone 50 MCG/ACT nasal spray Commonly known as: FLONASE Place 2 sprays into both nostrils daily.   ondansetron 4 MG disintegrating tablet Commonly known as: ZOFRAN-ODT Take 1 tablet (4 mg total) by mouth every 8 (eight) hours as needed for nausea or vomiting.   phentermine 30 MG capsule Take 30 mg by mouth every morning.   Phexxi 1.8-1-0.4 % Gel Generic drug: Lactic Ac-Citric Ac-Pot Bitart SMARTSIG:1 Application Vaginal Once PRN   rizatriptan 10 MG disintegrating tablet Commonly known as: MAXALT-MLT Take 10 mg by mouth daily as needed for migraine.   tamsulosin 0.4 MG Caps capsule Commonly known as: FLOMAX Take 1 capsule (0.4 mg total) by mouth daily. Started by: Pamela Altes, MD   tiZANidine 4 MG tablet Commonly known as: ZANAFLEX Take 4-8 mg by mouth as needed for muscle spasms.   topiramate 100 MG tablet Commonly known as: TOPAMAX Take 100 mg by mouth daily.   valACYclovir 500 MG tablet Commonly known as: VALTREX Take 500 mg by mouth  daily.        Allergies:  Allergies  Allergen Reactions   Nsaids Other (See Comments)    History of bleeding ulcers   Benadryl [Diphenhydramine] Other (See Comments)    "Crawling feeling under skin"   Compazine [Prochlorperazine]    Ibuprofen     Other reaction(s): GI Upset (intolerance)   Keppra [Levetiracetam] Other (See Comments)    "Makes her feel hungover"    Zolpidem Other (See Comments)    seizure   Emgality [Galcanezumab-Gnlm] Rash    Family History: Family History  Adopted: Yes  Problem Relation Age of Onset   Healthy Daughter     Social History:  reports that she has never smoked. She has never used smokeless tobacco. She reports current alcohol use. She reports that she does not use drugs.   Physical Exam: BP  113/77   Pulse 67   Ht 5' (1.524 m)   Wt 142 lb 8 oz (64.6 kg)   BMI 27.83 kg/m   Constitutional:  Alert and oriented, No acute distress. HEENT: [place] AT, moist mucus membranes.  Trachea midline, no masses. Cardiovascular: No clubbing, cyanosis, or edema. Respiratory: Normal respiratory effort, no increased work of breathing. GI: Abdomen is soft, nontender, nondistended, no abdominal masses GU: No CVA tenderness Skin: No rashes, bruises or suspicious lesions. Neurologic: Grossly intact, no focal deficits, moving all 4 extremities. Psychiatric: Normal mood and affect.  Laboratory Data:  Urinalysis 1+glucose/1+ketone Microscopy 0-5 WBC/3-10 RBC/>10 epis.   Pertinent Imaging:  Renal stone CT 10/30/2022 was personally reviewed and interpreted.  There are multiple non-obstructing bilateral renal calculi; 2 in the right kidney and 6 in the left kidney. No definite ureteral calculi are seen.  Results for orders placed during the hospital encounter of 10/30/22  CT Renal Stone Study  Narrative CLINICAL DATA:  Flank pain  EXAM: CT ABDOMEN AND PELVIS WITHOUT CONTRAST  TECHNIQUE: Multidetector CT imaging of the abdomen and pelvis was  performed following the standard protocol without IV contrast.  RADIATION DOSE REDUCTION: This exam was performed according to the departmental dose-optimization program which includes automated exposure control, adjustment of the mA and/or kV according to patient size and/or use of iterative reconstruction technique.  COMPARISON:  CT examination dated June 16, 2022  FINDINGS: Lower chest: No acute abnormality.  Hepatobiliary: No focal liver abnormality is seen. No gallstones, gallbladder wall thickening, or biliary dilatation.  Pancreas: Unremarkable. No pancreatic ductal dilatation or surrounding inflammatory changes.  Spleen: Normal in size without focal abnormality.  Adrenals/Urinary Tract: Adrenal glands are unremarkable. Multiple bilateral nonobstructing renal calculi. 4 mm calculus in the upper pole of the left kidney multiple other 2-3 mm calculi in the mid and lower pole of the left kidney. There are 3 2 mm calculi in the lower pole of the right kidney. No evidence of ureteral calculus or hydronephrosis. Bladder is unremarkable.  Stomach/Bowel: Stomach is within normal limits. Appendix appears normal. No evidence of bowel wall thickening, distention, or inflammatory changes.  Vascular/Lymphatic: No significant vascular findings are present. No enlarged abdominal or pelvic lymph nodes.  Reproductive: Uterus and bilateral adnexa are unremarkable.  Other: No abdominal wall hernia or abnormality. No abdominopelvic ascites.  Musculoskeletal: No acute or significant osseous findings.  IMPRESSION: 1. Multiple bilateral nonobstructing renal calculi. No evidence of ureteral calculus or hydronephrosis. 2. Bowel loops are normal in caliber. Normal appendix. No evidence of colitis or diverticulitis. 3. Uterus and adnexa are unremarkable. 4. No CT evidence of acute abdominal/pelvic process.   Electronically Signed By: Larose Hires D.O. On: 10/30/2022  18:36   Assessment & Plan:    1. Bilateral nephrolithiasis  Recommend metabolic evaluation. Although the CT showed no evidence of ureteral calculi, she does complain of urgency, which has been similar to previous symptoms prior to passing stones. Start Tamsulosin 0.4 mg daily. PA follow up one month for symptom recheck. We also discussed management options for her bilateral renal calculi, including observation with periodic KUB and ureteroscopy. Due to the number of calculi in her left kidney. She would required multiple SWL procedures and would not recommend shockwave lithotripsy.     [facility] [address], [address] [place], [place] [address] [phone]

## 2022-12-04 LAB — CULTURE, URINE COMPREHENSIVE

## 2022-12-08 ENCOUNTER — Other Ambulatory Visit: Payer: Self-pay

## 2022-12-08 ENCOUNTER — Emergency Department
Admission: EM | Admit: 2022-12-08 | Discharge: 2022-12-08 | Disposition: A | Discharge status: Home / Self Care | Payer: 59 | Attending: Emergency Medicine | Admitting: Emergency Medicine

## 2022-12-08 DIAGNOSIS — E860 Dehydration: Secondary | ICD-10-CM | POA: Diagnosis not present

## 2022-12-08 DIAGNOSIS — R000 Tachycardia, unspecified: Secondary | ICD-10-CM | POA: Diagnosis not present

## 2022-12-08 DIAGNOSIS — F10929 Alcohol use, unspecified with intoxication, unspecified: Secondary | ICD-10-CM | POA: Diagnosis not present

## 2022-12-08 DIAGNOSIS — R569 Unspecified convulsions: Secondary | ICD-10-CM | POA: Diagnosis not present

## 2022-12-08 DIAGNOSIS — I491 Atrial premature depolarization: Secondary | ICD-10-CM | POA: Diagnosis not present

## 2022-12-08 LAB — CBC WITH DIFFERENTIAL/PLATELET
Abs Immature Granulocytes: 0.02 10*3/uL (ref 0.00–0.07)
Basophils Absolute: 0 10*3/uL (ref 0.0–0.1)
Basophils Relative: 0 %
Eosinophils Absolute: 0.1 10*3/uL (ref 0.0–0.5)
Eosinophils Relative: 1 %
HCT: 42.4 % (ref 36.0–46.0)
Hemoglobin: 14.4 g/dL (ref 12.0–15.0)
Immature Granulocytes: 0 %
Lymphocytes Relative: 38 %
Lymphs Abs: 2.8 10*3/uL (ref 0.7–4.0)
MCH: 31.9 pg (ref 26.0–34.0)
MCHC: 34 g/dL (ref 30.0–36.0)
MCV: 94 fL (ref 80.0–100.0)
Monocytes Absolute: 0.7 10*3/uL (ref 0.1–1.0)
Monocytes Relative: 9 %
Neutro Abs: 3.9 10*3/uL (ref 1.7–7.7)
Neutrophils Relative %: 52 %
Platelets: 297 10*3/uL (ref 150–400)
RBC: 4.51 MIL/uL (ref 3.87–5.11)
RDW: 12.5 % (ref 11.5–15.5)
WBC: 7.5 10*3/uL (ref 4.0–10.5)
nRBC: 0 % (ref 0.0–0.2)

## 2022-12-08 LAB — URINALYSIS, ROUTINE W REFLEX MICROSCOPIC
Bacteria, UA: NONE SEEN
Bilirubin Urine: NEGATIVE
Glucose, UA: 50 mg/dL — AB
Ketones, ur: NEGATIVE mg/dL
Leukocytes,Ua: NEGATIVE
Nitrite: NEGATIVE
Protein, ur: NEGATIVE mg/dL
Specific Gravity, Urine: 1.004 — ABNORMAL LOW (ref 1.005–1.030)
WBC, UA: NONE SEEN WBC/hpf (ref 0–5)
pH: 5 (ref 5.0–8.0)

## 2022-12-08 LAB — COMPREHENSIVE METABOLIC PANEL
ALT: 16 U/L (ref 0–44)
AST: 22 U/L (ref 15–41)
Albumin: 4.9 g/dL (ref 3.5–5.0)
Alkaline Phosphatase: 57 U/L (ref 38–126)
Anion gap: 11 (ref 5–15)
BUN: 6 mg/dL (ref 6–20)
CO2: 17 mmol/L — ABNORMAL LOW (ref 22–32)
Calcium: 9.3 mg/dL (ref 8.9–10.3)
Chloride: 109 mmol/L (ref 98–111)
Creatinine, Ser: 0.55 mg/dL (ref 0.44–1.00)
GFR, Estimated: >60 mL/min (ref >60)
Glucose, Bld: 82 mg/dL (ref 70–99)
Potassium: 3.5 mmol/L (ref 3.5–5.1)
Sodium: 137 mmol/L (ref 135–145)
Total Bilirubin: 0.6 mg/dL (ref 0.3–1.2)
Total Protein: 7.2 g/dL (ref 6.5–8.1)

## 2022-12-08 LAB — CALCULI, WITH PHOTOGRAPH (CLINICAL LAB)
Calcium Oxalate Monohydrate: 50 %
Hydroxyapatite: 50 %
Weight Calculi: 36 mg

## 2022-12-08 LAB — MAGNESIUM: Magnesium: 2.1 mg/dL (ref 1.7–2.4)

## 2022-12-08 LAB — POC URINE PREG, ED: Preg Test, Ur: NEGATIVE

## 2022-12-08 LAB — CBG MONITORING, ED: Glucose-Capillary: 84 mg/dL (ref 70–99)

## 2022-12-08 LAB — LIPASE, BLOOD: Lipase: 27 U/L (ref 11–51)

## 2022-12-08 MED ORDER — KETOROLAC TROMETHAMINE 30 MG/ML IJ SOLN
15.0000 mg | Freq: Once | INTRAMUSCULAR | Status: AC
  Administered 2022-12-08: 15 mg via INTRAVENOUS
  Filled 2022-12-08: qty 1

## 2022-12-08 MED ORDER — ONDANSETRON HCL 4 MG/2ML IJ SOLN
4.0000 mg | Freq: Once | INTRAMUSCULAR | Status: AC
  Administered 2022-12-08: 4 mg via INTRAVENOUS
  Filled 2022-12-08: qty 2

## 2022-12-08 MED ORDER — LACTATED RINGERS IV BOLUS
1000.0000 mL | Freq: Once | INTRAVENOUS | Status: AC
  Administered 2022-12-08: 1000 mL via INTRAVENOUS

## 2022-12-08 NOTE — ED Triage Notes (Signed)
BIB EMS/ spouse reports x11 seizures lasting 1 min prior to EMS arrival/ no meds given at home or by EMS/ takes Xcopri/ VNS implant/ pt is A&O x4

## 2022-12-08 NOTE — ED Notes (Signed)
CBG 84. 

## 2022-12-08 NOTE — ED Provider Notes (Signed)
[facility] Provider Note    Event Date/Time   First MD Initiated Contact with Patient 12/08/22 [phone]     (approximate)   History   Seizures   HPI  Pamela Booker is a 27 y.o. female who presents to the ED for evaluation of Seizures   I review a [facility] ED visit for seizures, seen 2 weeks ago.  She has a history of a seizure disorder with a VNS implant as well as taking Xcopri.  Similar presentation with breakthrough seizures in the setting of diarrhea and poor p.o. intake.  CT with evidence of proctocolitis.  She presents tonight from home via EMS for evaluation of seizures.  Reportedly had 10 or 12 seizures today.  EMS was called out twice, she refused transport the first time, but after I final seizure she agrees to transport to [place].  On arrival she reports diffuse myalgias.  No seizure with EMS and no prehospital medications provided.  She reports continued poor toleration of p.o. intake and concern for dehydration, similar to when she was at [facility] a couple weeks ago.   Physical Exam   Triage Vital Signs: ED Triage Vitals  Enc Vitals Group     BP 12/08/22 0219 (!) 141/93     Pulse Rate 12/08/22 0219 (!) 107     Resp 12/08/22 0219 (!) 24     Temp 12/08/22 0219 98.4 F (36.9 C)     Temp Source 12/08/22 0219 Oral     SpO2 12/08/22 0216 98 %     Weight 12/08/22 0220 137 lb (62.1 kg)     Height --      Head Circumference --      Peak Flow --      Pain Score 12/08/22 0220 6     Pain Loc --      Pain Edu? --      Excl. in GC? --     Most recent vital signs: Vitals:   12/08/22 0515 12/08/22 0518  BP:  123/84  Pulse: 94   Resp: 16   Temp:  98 F (36.7 C)  SpO2: 99%     General: Awake, no distress.  CV:  Good peripheral perfusion.  Resp:  Normal effort.  Abd:  No distention.  MSK:  No deformity noted.  No signs of trauma Neuro:  No focal deficits appreciated. Cranial nerves II through XII intact 5/5 strength and sensation in all 4  extremities Other:     ED Results / Procedures / Treatments   Labs (all labs ordered are listed, but only abnormal results are displayed) Labs Reviewed  COMPREHENSIVE METABOLIC PANEL - Abnormal; Notable for the following components:      Result Value   CO2 17 (*)    All other components within normal limits  URINALYSIS, ROUTINE W REFLEX MICROSCOPIC - Abnormal; Notable for the following components:   Color, Urine STRAW (*)    APPearance CLEAR (*)    Specific Gravity, Urine 1.004 (*)    Glucose, UA 50 (*)    Hgb urine dipstick MODERATE (*)    All other components within normal limits  CBC WITH DIFFERENTIAL/PLATELET  LIPASE, BLOOD  MAGNESIUM  CBG MONITORING, ED  POC URINE PREG, ED    EKG Sinus tachycardia the rate of 107 bpm.  Normal axis and intervals.  No clear signs of acute ischemia.  RADIOLOGY   Official radiology report(s): No results found.  PROCEDURES and INTERVENTIONS:  .1-3 Lead EKG Interpretation  Performed by: Delton Prairie, MD Authorized by: Delton Prairie, MD     Interpretation: normal     ECG rate:  90   ECG rate assessment: normal     Rhythm: sinus rhythm     Ectopy: none     Conduction: normal     Medications  lactated ringers bolus 1,000 mL (0 mLs Intravenous Stopped 12/08/22 0433)  ondansetron (ZOFRAN) injection 4 mg (4 mg Intravenous Given 12/08/22 0321)  ketorolac (TORADOL) 30 MG/ML injection 15 mg (15 mg Intravenous Given 12/08/22 0329)     IMPRESSION / MDM / ASSESSMENT AND PLAN / ED COURSE  I reviewed the triage vital signs and the nursing notes.  Differential diagnosis includes, but is not limited to, pseudoseizure, seizure, medication noncompliance, status epilepticus  {Patient presents with symptoms of an acute illness or injury that is potentially life-threatening.  Patient presents with breakthrough seizures.  She looks well and is not postictal and no signs of trauma or neurologic deficits.  Blood work with mild non-anion gap metabolic  acidosis with decreased bicarbonate.  Normal electrolytes, CBC.  Urine without infectious features.  No recurrence of seizures and she reports feeling better after some IV fluids.  No abdominal pain or tenderness to necessitate repeat cross-sectional imaging considering her ED visit a few weeks ago and proctocolitis.  We discussed medication compliance and return precautions.  She is suitable for outpatient management with neurology follow-up.  Clinical Course as of 12/08/22 0752  Fri Dec 08, 2022  0450 Reassessed.  Feeling better and she is appreciative.  We discussed workup, possible etiologies and return precautions.  She is asking to go home.  We discussed neurology follow-up [DS]    Clinical Course User Index [DS] Delton Prairie, MD     FINAL CLINICAL IMPRESSION(S) / ED DIAGNOSES   Final diagnoses:  Seizure (HCC)     Rx / DC Orders   ED Discharge Orders     None        Note:  This document was prepared using Dragon voice recognition software and may include unintentional dictation errors.   Delton Prairie, MD 12/08/22 [phone]

## 2022-12-14 DIAGNOSIS — F3281 Premenstrual dysphoric disorder: Secondary | ICD-10-CM | POA: Diagnosis not present

## 2022-12-14 DIAGNOSIS — F4310 Post-traumatic stress disorder, unspecified: Secondary | ICD-10-CM | POA: Diagnosis not present

## 2022-12-20 DIAGNOSIS — N200 Calculus of kidney: Secondary | ICD-10-CM | POA: Diagnosis not present

## 2022-12-25 ENCOUNTER — Other Ambulatory Visit: Payer: 59

## 2022-12-25 ENCOUNTER — Ambulatory Visit
Admission: RE | Admit: 2022-12-25 | Discharge: 2022-12-25 | Disposition: A | Discharge status: Home / Self Care | Payer: 59 | Source: Ambulatory Visit | Attending: Urology | Admitting: Urology

## 2022-12-25 DIAGNOSIS — N200 Calculus of kidney: Secondary | ICD-10-CM | POA: Diagnosis not present

## 2022-12-25 DIAGNOSIS — N2889 Other specified disorders of kidney and ureter: Secondary | ICD-10-CM | POA: Diagnosis not present

## 2022-12-26 ENCOUNTER — Encounter: Payer: Self-pay | Admitting: Urology

## 2022-12-26 LAB — LITHOLINK 24HR URINE PANEL
Ammonium, Urine: 24 mmol/24 hr (ref 15–60)
Calcium Oxalate Saturation: 5.81 — ABNORMAL LOW (ref 6.00–10.00)
Calcium Phosphate Saturation: 2.01 — ABNORMAL HIGH (ref 0.50–2.00)
Calcium, Urine: 153 mg/24 hr (ref <200)
Calcium/Creatinine Ratio: 158 mg/g creat (ref 51–262)
Calcium/Kg Body Weight: 2.5 mg/24 hr/kg (ref <4.0)
Chloride, Urine: 114 mmol/24 hr (ref 70–250)
Citrate, Urine: 823 mg/24 hr (ref >550)
Creatinine, Urine: 966 mg/24 hr
Creatinine/Kg Body Weight: 15.8 mg/24 hr/kg (ref 8.7–20.3)
Cystine, Urine, Qualitative: NEGATIVE
Magnesium, Urine: 38 mg/24 hr (ref 30–120)
Oxalate, Urine: 16 mg/24 hr — ABNORMAL LOW (ref 20–40)
Phosphorus, Urine: 458 mg/24 hr — ABNORMAL LOW (ref 600–1200)
Potassium, Urine: 27 mmol/24 hr (ref 20–100)
Protein Catabolic Rate: 0.4 g/kg/24 hr — ABNORMAL LOW (ref 0.8–1.4)
Sodium, Urine: 119 mmol/24 hr (ref 50–150)
Sulfate, Urine: 9 meq/24 hr — ABNORMAL LOW (ref 20–80)
Urea Nitrogen, Urine: 2.54 g/24 hr — ABNORMAL LOW (ref 6.00–14.00)
Uric Acid Saturation: 0.21 (ref <1.00)
Uric Acid, Urine: 337 mg/24 hr (ref <750)
Urine Volume (Preserved): 1120 mL/24 hr (ref 500–4000)
pH, 24 hr, Urine: 6.517 — ABNORMAL HIGH (ref 5.800–6.200)

## 2022-12-26 LAB — MICROSCOPIC EXAMINATION: RBC, Urine: NONE SEEN /hpf (ref 0–2)

## 2022-12-26 LAB — URINALYSIS, COMPLETE
Bilirubin, UA: NEGATIVE
Ketones, UA: NEGATIVE
Leukocytes,UA: NEGATIVE
Nitrite, UA: NEGATIVE
Protein,UA: NEGATIVE
RBC, UA: NEGATIVE
Specific Gravity, UA: 1.02 (ref 1.005–1.030)
Urobilinogen, Ur: 0.2 mg/dL (ref 0.2–1.0)
pH, UA: 7 (ref 5.0–7.5)

## 2022-12-27 ENCOUNTER — Telehealth: Payer: Self-pay | Admitting: *Deleted

## 2022-12-27 LAB — CULTURE, URINE COMPREHENSIVE

## 2022-12-27 NOTE — Telephone Encounter (Signed)
Has an appointment with Sam tomorrow 7/25 and she will discuss options with her

## 2022-12-27 NOTE — Telephone Encounter (Signed)
pt is on the phone stating that she feels the urge to push while urinating, pt states she feels like a stone is coming out,  any advice? she is on your schedule tomorrow morning   does she have flomax?  yes she said she is taking everyday  fevers?  no fevers, vagina pain   unfortunately my advice is to manage her symptoms with pain meds, continue flomax, and push fluids until it comes out sounds like it's imminent   pt said she could have a seizure if the stone doesn't come out said she is epileptic   I am very well aware

## 2022-12-28 ENCOUNTER — Ambulatory Visit (INDEPENDENT_AMBULATORY_CARE_PROVIDER_SITE_OTHER): Payer: 59 | Admitting: Physician Assistant

## 2022-12-28 ENCOUNTER — Ambulatory Visit
Admission: RE | Admit: 2022-12-28 | Discharge: 2022-12-28 | Disposition: A | Discharge status: Home / Self Care | Payer: 59 | Source: Ambulatory Visit | Attending: Physician Assistant | Admitting: Physician Assistant

## 2022-12-28 ENCOUNTER — Other Ambulatory Visit: Payer: Self-pay

## 2022-12-28 ENCOUNTER — Telehealth: Payer: Self-pay

## 2022-12-28 VITALS — BP 135/84 | HR 116 | Temp 97.8°F | Ht 60.0 in | Wt 137.0 lb

## 2022-12-28 DIAGNOSIS — N200 Calculus of kidney: Secondary | ICD-10-CM | POA: Diagnosis not present

## 2022-12-28 DIAGNOSIS — R109 Unspecified abdominal pain: Secondary | ICD-10-CM | POA: Diagnosis not present

## 2022-12-28 LAB — URINALYSIS, COMPLETE
Bilirubin, UA: NEGATIVE
Ketones, UA: NEGATIVE
Leukocytes,UA: NEGATIVE
Nitrite, UA: NEGATIVE
Protein,UA: NEGATIVE
RBC, UA: NEGATIVE
Specific Gravity, UA: 1.025 (ref 1.005–1.030)
Urobilinogen, Ur: 0.2 mg/dL (ref 0.2–1.0)
pH, UA: 5.5 (ref 5.0–7.5)

## 2022-12-28 LAB — MICROSCOPIC EXAMINATION: Epithelial Cells (non renal): >10 /hpf — AB (ref 0–10)

## 2022-12-28 MED ORDER — TAMSULOSIN HCL 0.4 MG PO CAPS: 0.4000 mg | ORAL_CAPSULE | Freq: Every day | ORAL | 0 refills | Status: DC

## 2022-12-28 MED ORDER — ONDANSETRON 4 MG PO TBDP
4.0000 mg | ORAL_TABLET | Freq: Three times a day (TID) | ORAL | 0 refills | Status: DC | PRN
Start: 2022-12-28 — End: 2023-05-09

## 2022-12-28 MED ORDER — OXYCODONE-ACETAMINOPHEN 5-325 MG PO TABS
1.0000 | ORAL_TABLET | Freq: Four times a day (QID) | ORAL | 0 refills | Status: AC | PRN
Start: 2022-12-28 — End: 2023-01-02

## 2022-12-28 NOTE — Progress Notes (Signed)
Surgical Physician Order Form [facility] [place]  Dr. Lonna Cobb * Scheduling expectation : 01/02/2023  *Length of Case:   *Clearance needed: per PAT, PMH epilepsy with VNS in place  *Anticoagulation Instructions: N/A  *Aspirin Instructions: N/A  *Post-op visit Date/Instructions:  TBD  *Diagnosis: Left Nephrolithiasis  *Procedure: left Ureteroscopy w/laser lithotripsy & stent placement ([id number])   Additional orders: N/A  -Admit type: OUTpatient  -Anesthesia: General  -VTE Prophylaxis Standing Order SCD's       Other:   -Standing Lab Orders Per Anesthesia    Lab other: None  -Standing Test orders EKG/Chest x-ray per Anesthesia       Test other:   - Medications:  Ancef 2gm IV  -Other orders:  N/A

## 2022-12-28 NOTE — H&P (View-Only) (Signed)
12/28/2022 12:32 PM   Pamela Booker [birth year] [id number]  CC: Chief Complaint  Patient presents with   Follow-up   Nephrolithiasis   HPI: Pamela Booker is a 27 y.o. female with PMH epilepsy with vagus nerve stimulator, endometriosis, and recurrent nephrolithiasis who presents today for evaluation of possible acute stone episode.  She spontaneously passed an approximate 4mm stone earlier this week but is having persistent bilateral flank pain and vaginal pressure, so she thinks she is still passing a stone. KUB earlier this week with no evidence of ureteral stone and UA was bland.   On most recent CT stone study dated 10/30/2022, she had multiple bilateral L>R renal stones.  She has never had a procedure for stone management before. She has spontaneously passed multiple stones. She is concerned about acute stone episodes increasing her risk for seizure. Additionally she is hoping to start a family with her fiance soon and would like to try to avoid acute stone episodes during pregnancy.  She recently finished menstruating and states there is no possibility of pregnancy today.  Additionally, she mailed in a 24-hour urine collection for metabolic workup recently, however a blood sample was not obtained and she passed a stone the day after collecting her sample.  In-office UA today positive for 1+ glucose; urine microscopy with >10 epithelial cells/hpf and many bacteria.   PMH: Past Medical History:  Diagnosis Date   Anxiety    Encounter for loop recorder check 06/10/2019   Epilepsy (HCC)    Hypertension    Loop: Biotronic loop recorder in situ 05/06/2019   Scheduled Remote loop recorder check 06/09/2019: This represents an unremarkable monitoring session. The presenting rhythm is not available. No symptomatic or automatic episodes were triggered. Daily activity trends are stable. Heart rate variability trends are stable.   Migraines    Palpitations    PVC  (premature ventricular contraction)    PVC's (premature ventricular contractions)    Seizures (HCC)    Syncope and collapse 06/10/2019    Surgical History: Past Surgical History:  Procedure Laterality Date   LAPAROSCOPY     TONSILLECTOMY     VAGUS NERVE STIMULATOR INSERTION      Home Medications:  Allergies as of 12/28/2022       Reactions   Nsaids Other (See Comments)   History of bleeding ulcers   Ativan [lorazepam] Itching   Compazine [prochlorperazine]    Diphenhydramine Other (See Comments)   "Crawling feeling under skin"   Ibuprofen    Other reaction(s): GI Upset (intolerance)   Keppra [levetiracetam] Other (See Comments)   "Makes her feel hungover"    Zolpidem Other (See Comments)   seizure   Emgality [galcanezumab-gnlm] Rash        Medication List        Accurate as of December 28, 2022 12:32 PM. If you have any questions, ask your nurse or doctor.          acebutolol 200 MG capsule Commonly known as: SECTRAL TAKE ONE CAPSULE BY MOUTH TWICE A DAY DISCONTINUE VERAPAMIL What changed:  how much to take how to take this when to take this   acetaminophen 325 MG tablet Commonly known as: TYLENOL Take by mouth.   Ambien 10 MG tablet Generic drug: zolpidem Ambien 10 mg   diazepam 5 MG tablet Commonly known as: VALIUM Take 5 mg by mouth 2 (two) times daily as needed for muscle spasms.   diazepam 5 MG tablet Commonly known as: VALIUM Take  by mouth.   escitalopram 20 MG tablet Commonly known as: LEXAPRO Take 20 mg by mouth daily.   fluticasone 50 MCG/ACT nasal spray Commonly known as: FLONASE Place 2 sprays into both nostrils daily.   Keppra 500 MG tablet Generic drug: levETIRAcetam Keppra 500 mg   LaMICtal 25 MG tablet Generic drug: lamoTRIgine LaMICtal 25 mg   lidocaine 2 % solution Commonly known as: XYLOCAINE USE AS DIRECTED 15 MLS IN THE MOUTH OR THROAT AS NEEDED FOR MOUTH PAIN. Mouth/Throat for 5 Days   LORazepam 1 MG  tablet Commonly known as: ATIVAN TAKE 1 TABLET BY MOUTH EVERY 6 HOURS AS NEEDED FOR ANXIETY. Oral for 2 Days   ondansetron 4 MG disintegrating tablet Commonly known as: ZOFRAN-ODT Take 1 tablet (4 mg total) by mouth every 8 (eight) hours as needed for nausea or vomiting.   oxcarbazepine 600 MG tablet Commonly known as: TRILEPTAL Take by mouth.   oxyCODONE-acetaminophen 5-325 MG tablet Commonly known as: PERCOCET/ROXICET Take 1-2 tablets by mouth every 6 (six) hours as needed for up to 5 days for severe pain.   phentermine 30 MG capsule Take 30 mg by mouth every morning.   Phexxi 1.8-1-0.4 % Gel Generic drug: Lactic Ac-Citric Ac-Pot Bitart SMARTSIG:1 Application Vaginal Once PRN   rizatriptan 10 MG disintegrating tablet Commonly known as: MAXALT-MLT Take 10 mg by mouth daily as needed for migraine.   sertraline 100 MG tablet Commonly known as: ZOLOFT sertraline 100 mg   tamsulosin 0.4 MG Caps capsule Commonly known as: FLOMAX Take 1 capsule (0.4 mg total) by mouth daily.   tiZANidine 4 MG tablet Commonly known as: ZANAFLEX Take 4-8 mg by mouth as needed for muscle spasms.   topiramate 100 MG tablet Commonly known as: TOPAMAX Take 100 mg by mouth daily.   valACYclovir 500 MG tablet Commonly known as: VALTREX Take 500 mg by mouth daily.   Xcopri 200 MG Tabs Generic drug: Cenobamate 1 tablet Orally Once a day for 30 days        Allergies:  Allergies  Allergen Reactions   Nsaids Other (See Comments)    History of bleeding ulcers   Ativan [Lorazepam] Itching   Compazine [Prochlorperazine]    Diphenhydramine Other (See Comments)    "Crawling feeling under skin"   Ibuprofen     Other reaction(s): GI Upset (intolerance)   Keppra [Levetiracetam] Other (See Comments)    "Makes her feel hungover"    Zolpidem Other (See Comments)    seizure   Emgality [Galcanezumab-Gnlm] Rash    Family History: Family History  Adopted: Yes  Problem Relation Age of Onset    Healthy Daughter     Social History:   reports that she has never smoked. She has never used smokeless tobacco. She reports current alcohol use. She reports that she does not use drugs.  Physical Exam: BP 135/84   Pulse (!) 116   Temp 97.8 F (36.6 C)   Ht 5' (1.524 m)   Wt 137 lb (62.1 kg)   LMP 11/08/2022 (Approximate)   BMI 26.76 kg/m   Constitutional:  Alert and oriented, uncomfortable appearing HEENT: [place], AT Cardiovascular: No clubbing, cyanosis, or edema Respiratory: Normal respiratory effort, no increased work of breathing Skin: No rashes, bruises or suspicious lesions Neurologic: Grossly intact, no focal deficits, moving all 4 extremities Psychiatric: Normal mood and affect  Laboratory Data: Results for orders placed or performed in visit on 12/28/22  Microscopic Examination   Urine  Result Value Ref Range  WBC, UA 0-5 0 - 5 /hpf   RBC, Urine 0-2 0 - 2 /hpf   Epithelial Cells (non renal) >10 (A) 0 - 10 /hpf   Mucus, UA Present (A) Not Estab.   Bacteria, UA Many (A) None seen/Few  Urinalysis, Complete  Result Value Ref Range   Specific Gravity, UA 1.025 1.005 - 1.030   pH, UA 5.5 5.0 - 7.5   Color, UA Yellow Yellow   Appearance Ur Clear Clear   Leukocytes,UA Negative Negative   Protein,UA Negative Negative/Trace   Glucose, UA 1+ (A) Negative   Ketones, UA Negative Negative   RBC, UA Negative Negative   Bilirubin, UA Negative Negative   Urobilinogen, Ur 0.2 0.2 - 1.0 mg/dL   Nitrite, UA Negative Negative   Microscopic Examination See below:    Pertinent Imaging: Results for orders placed during the hospital encounter of 12/25/22  Abdomen 1 view (KUB)  Narrative CLINICAL DATA:  Nephrolithiasis.  EXAM: ABDOMEN - 1 VIEW  COMPARISON:  CT 10/30/2022.  FINDINGS: The bowel gas pattern is normal. A few calcifications measuring 3-4 mm overlie the left kidney. Right-sided stones are obscured by overlying stool  shadows.  IMPRESSION: Nephrolithiasis.   Electronically Signed By: Layla Maw M.D. On: 12/25/2022 18:16   I personally reviewed the images referenced above and note no radiopaque ureteral stones on KUB.  Assessment & Plan:   1. Nephrolithiasis Multiple known bilateral renal stones L>R. Persistent vaginal pressure and bilateral flank pain after passing a stone earlier this week. UA is bland, but contaminated appearing.  With numerous renal stones, concern regarding seizures, and desire for pregnancy, we discussed pursuing definitive stone management on an elective basis and she would like to do this. I do not recommend ESWL due to the number of stones and she agrees. We discussed ureteroscopy with laser lithotripsy and stent placement as an alternative.  We discussed URS as an outpatient procedure and the risks including bleeding, infection, and damage to surrounding structures. We discussed potential severe discomfort with ureteral stents and whether or not to proceed with bilateral URS versus taking a staged approach. She prefers the latter, which is reasonable. We also discussed the risk of not fully clearing her stone burden as well as the need to place a ureteral stent for dilation 2 weeks prior to stone removal if her collecting systems are not accessible at the time of planned procedure. She understands these risks and wishes to proceed.  With greater stone burden on the left, we will plan for left URS next week with Dr. Lonna Cobb, pending STAT CT stone study today. If CT shows a right ureteral stone and she has not passed it by the time of surgery, we will perform right URS instead. She is in agreement with this plan.  Preop UA/Cx sent; Flomax, Zofran, and Percocet prescribed for active stone episode; booking sheet for next week completed. All questions answered.  Will need to repeat metabolic workup after completion of staged bilateral ureteroscopy as above.  ADDENDUM: No acute  stone episode seen on CT today. Will proceed with LEFT URS/LL/stent placement with Dr. Lonna Cobb on 7/30. - Urinalysis, Complete - CULTURE, URINE COMPREHENSIVE - CT RENAL STONE STUDY; Future - tamsulosin (FLOMAX) 0.4 MG CAPS capsule; Take 1 capsule (0.4 mg total) by mouth daily.  Dispense: 30 capsule; Refill: 0 - ondansetron (ZOFRAN-ODT) 4 MG disintegrating tablet; Take 1 tablet (4 mg total) by mouth every 8 (eight) hours as needed for nausea or vomiting.  Dispense: 20 tablet;  Refill: 0 - oxyCODONE-acetaminophen (PERCOCET/ROXICET) 5-325 MG tablet; Take 1-2 tablets by mouth every 6 (six) hours as needed for up to 5 days for severe pain.  Dispense: 10 tablet; Refill: 0 - Ambulatory Referral For Surgery Scheduling  Return for Will call with results, Will call to schedule surgery.  Carman Ching, PA-C  [facility] [place] [address], [address] [place], [place] [address] [phone]

## 2022-12-28 NOTE — H&P (View-Only) (Signed)
12/28/2022 12:32 PM   Pamela Booker 1995-07-17 [id number]  CC: Chief Complaint  Patient presents with   Follow-up   Nephrolithiasis   HPI: Pamela Booker is a 27 y.o. female with PMH epilepsy with vagus nerve stimulator, endometriosis, and recurrent nephrolithiasis who presents today for evaluation of possible acute stone episode.  She spontaneously passed an approximate 4mm stone earlier this week but is having persistent bilateral flank pain and vaginal pressure, so she thinks she is still passing a stone. KUB earlier this week with no evidence of ureteral stone and UA was bland.   On most recent CT stone study dated 10/30/2022, she had multiple bilateral L>R renal stones.  She has never had a procedure for stone management before. She has spontaneously passed multiple stones. She is concerned about acute stone episodes increasing her risk for seizure. Additionally she is hoping to start a family with her fiance soon and would like to try to avoid acute stone episodes during pregnancy.  She recently finished menstruating and states there is no possibility of pregnancy today.  Additionally, she mailed in a 24-hour urine collection for metabolic workup recently, however a blood sample was not obtained and she passed a stone the day after collecting her sample.  In-office UA today positive for 1+ glucose; urine microscopy with >10 epithelial cells/hpf and many bacteria.   PMH: Past Medical History:  Diagnosis Date   Anxiety    Encounter for loop recorder check 06/10/2019   Epilepsy (HCC)    Hypertension    Loop: Biotronic loop recorder in situ 05/06/2019   Scheduled Remote loop recorder check 06/09/2019: This represents an unremarkable monitoring session. The presenting rhythm is not available. No symptomatic or automatic episodes were triggered. Daily activity trends are stable. Heart rate variability trends are stable.   Migraines    Palpitations    PVC  (premature ventricular contraction)    PVC's (premature ventricular contractions)    Seizures (HCC)    Syncope and collapse 06/10/2019    Surgical History: Past Surgical History:  Procedure Laterality Date   LAPAROSCOPY     TONSILLECTOMY     VAGUS NERVE STIMULATOR INSERTION      Home Medications:  Allergies as of 12/28/2022       Reactions   Nsaids Other (See Comments)   History of bleeding ulcers   Ativan [lorazepam] Itching   Compazine [prochlorperazine]    Diphenhydramine Other (See Comments)   "Crawling feeling under skin"   Ibuprofen    Other reaction(s): GI Upset (intolerance)   Keppra [levetiracetam] Other (See Comments)   "Makes her feel hungover"    Zolpidem Other (See Comments)   seizure   Emgality [galcanezumab-gnlm] Rash        Medication List        Accurate as of December 28, 2022 12:32 PM. If you have any questions, ask your nurse or doctor.          acebutolol 200 MG capsule Commonly known as: SECTRAL TAKE ONE CAPSULE BY MOUTH TWICE A DAY DISCONTINUE VERAPAMIL What changed:  how much to take how to take this when to take this   acetaminophen 325 MG tablet Commonly known as: TYLENOL Take by mouth.   Ambien 10 MG tablet Generic drug: zolpidem Ambien 10 mg   diazepam 5 MG tablet Commonly known as: VALIUM Take 5 mg by mouth 2 (two) times daily as needed for muscle spasms.   diazepam 5 MG tablet Commonly known as: VALIUM Take  by mouth.   escitalopram 20 MG tablet Commonly known as: LEXAPRO Take 20 mg by mouth daily.   fluticasone 50 MCG/ACT nasal spray Commonly known as: FLONASE Place 2 sprays into both nostrils daily.   Keppra 500 MG tablet Generic drug: levETIRAcetam Keppra 500 mg   LaMICtal 25 MG tablet Generic drug: lamoTRIgine LaMICtal 25 mg   lidocaine 2 % solution Commonly known as: XYLOCAINE USE AS DIRECTED 15 MLS IN THE MOUTH OR THROAT AS NEEDED FOR MOUTH PAIN. Mouth/Throat for 5 Days   LORazepam 1 MG  tablet Commonly known as: ATIVAN TAKE 1 TABLET BY MOUTH EVERY 6 HOURS AS NEEDED FOR ANXIETY. Oral for 2 Days   ondansetron 4 MG disintegrating tablet Commonly known as: ZOFRAN-ODT Take 1 tablet (4 mg total) by mouth every 8 (eight) hours as needed for nausea or vomiting.   oxcarbazepine 600 MG tablet Commonly known as: TRILEPTAL Take by mouth.   oxyCODONE-acetaminophen 5-325 MG tablet Commonly known as: PERCOCET/ROXICET Take 1-2 tablets by mouth every 6 (six) hours as needed for up to 5 days for severe pain.   phentermine 30 MG capsule Take 30 mg by mouth every morning.   Phexxi 1.8-1-0.4 % Gel Generic drug: Lactic Ac-Citric Ac-Pot Bitart SMARTSIG:1 Application Vaginal Once PRN   rizatriptan 10 MG disintegrating tablet Commonly known as: MAXALT-MLT Take 10 mg by mouth daily as needed for migraine.   sertraline 100 MG tablet Commonly known as: ZOLOFT sertraline 100 mg   tamsulosin 0.4 MG Caps capsule Commonly known as: FLOMAX Take 1 capsule (0.4 mg total) by mouth daily.   tiZANidine 4 MG tablet Commonly known as: ZANAFLEX Take 4-8 mg by mouth as needed for muscle spasms.   topiramate 100 MG tablet Commonly known as: TOPAMAX Take 100 mg by mouth daily.   valACYclovir 500 MG tablet Commonly known as: VALTREX Take 500 mg by mouth daily.   Xcopri 200 MG Tabs Generic drug: Cenobamate 1 tablet Orally Once a day for 30 days        Allergies:  Allergies  Allergen Reactions   Nsaids Other (See Comments)    History of bleeding ulcers   Ativan [Lorazepam] Itching   Compazine [Prochlorperazine]    Diphenhydramine Other (See Comments)    "Crawling feeling under skin"   Ibuprofen     Other reaction(s): GI Upset (intolerance)   Keppra [Levetiracetam] Other (See Comments)    "Makes her feel hungover"    Zolpidem Other (See Comments)    seizure   Emgality [Galcanezumab-Gnlm] Rash    Family History: Family History  Adopted: Yes  Problem Relation Age of Onset    Healthy Daughter     Social History:   reports that she has never smoked. She has never used smokeless tobacco. She reports current alcohol use. She reports that she does not use drugs.  Physical Exam: BP 135/84   Pulse (!) 116   Temp 97.8 F (36.6 C)   Ht 5' (1.524 m)   Wt 137 lb (62.1 kg)   LMP 11/08/2022 (Approximate)   BMI 26.76 kg/m   Constitutional:  Alert and oriented, uncomfortable appearing HEENT: [place], AT Cardiovascular: No clubbing, cyanosis, or edema Respiratory: Normal respiratory effort, no increased work of breathing Skin: No rashes, bruises or suspicious lesions Neurologic: Grossly intact, no focal deficits, moving all 4 extremities Psychiatric: Normal mood and affect  Laboratory Data: Results for orders placed or performed in visit on 12/28/22  Microscopic Examination   Urine  Result Value Ref Range  WBC, UA 0-5 0 - 5 /hpf   RBC, Urine 0-2 0 - 2 /hpf   Epithelial Cells (non renal) >10 (A) 0 - 10 /hpf   Mucus, UA Present (A) Not Estab.   Bacteria, UA Many (A) None seen/Few  Urinalysis, Complete  Result Value Ref Range   Specific Gravity, UA 1.025 1.005 - 1.030   pH, UA 5.5 5.0 - 7.5   Color, UA Yellow Yellow   Appearance Ur Clear Clear   Leukocytes,UA Negative Negative   Protein,UA Negative Negative/Trace   Glucose, UA 1+ (A) Negative   Ketones, UA Negative Negative   RBC, UA Negative Negative   Bilirubin, UA Negative Negative   Urobilinogen, Ur 0.2 0.2 - 1.0 mg/dL   Nitrite, UA Negative Negative   Microscopic Examination See below:    Pertinent Imaging: Results for orders placed during the hospital encounter of 12/25/22  Abdomen 1 view (KUB)  Narrative CLINICAL DATA:  Nephrolithiasis.  EXAM: ABDOMEN - 1 VIEW  COMPARISON:  CT 10/30/2022.  FINDINGS: The bowel gas pattern is normal. A few calcifications measuring 3-4 mm overlie the left kidney. Right-sided stones are obscured by overlying stool  shadows.  IMPRESSION: Nephrolithiasis.   Electronically Signed By: Layla Maw M.D. On: 12/25/2022 18:16   I personally reviewed the images referenced above and note no radiopaque ureteral stones on KUB.  Assessment & Plan:   1. Nephrolithiasis Multiple known bilateral renal stones L>R. Persistent vaginal pressure and bilateral flank pain after passing a stone earlier this week. UA is bland, but contaminated appearing.  With numerous renal stones, concern regarding seizures, and desire for pregnancy, we discussed pursuing definitive stone management on an elective basis and she would like to do this. I do not recommend ESWL due to the number of stones and she agrees. We discussed ureteroscopy with laser lithotripsy and stent placement as an alternative.  We discussed URS as an outpatient procedure and the risks including bleeding, infection, and damage to surrounding structures. We discussed potential severe discomfort with ureteral stents and whether or not to proceed with bilateral URS versus taking a staged approach. She prefers the latter, which is reasonable. We also discussed the risk of not fully clearing her stone burden as well as the need to place a ureteral stent for dilation 2 weeks prior to stone removal if her collecting systems are not accessible at the time of planned procedure. She understands these risks and wishes to proceed.  With greater stone burden on the left, we will plan for left URS next week with Dr. Lonna Cobb, pending STAT CT stone study today. If CT shows a right ureteral stone and she has not passed it by the time of surgery, we will perform right URS instead. She is in agreement with this plan.  Preop UA/Cx sent; Flomax, Zofran, and Percocet prescribed for active stone episode; booking sheet for next week completed. All questions answered.  Will need to repeat metabolic workup after completion of staged bilateral ureteroscopy as above.  ADDENDUM: No acute  stone episode seen on CT today. Will proceed with LEFT URS/LL/stent placement with Dr. Lonna Cobb on 7/30. - Urinalysis, Complete - CULTURE, URINE COMPREHENSIVE - CT RENAL STONE STUDY; Future - tamsulosin (FLOMAX) 0.4 MG CAPS capsule; Take 1 capsule (0.4 mg total) by mouth daily.  Dispense: 30 capsule; Refill: 0 - ondansetron (ZOFRAN-ODT) 4 MG disintegrating tablet; Take 1 tablet (4 mg total) by mouth every 8 (eight) hours as needed for nausea or vomiting.  Dispense: 20 tablet;  Refill: 0 - oxyCODONE-acetaminophen (PERCOCET/ROXICET) 5-325 MG tablet; Take 1-2 tablets by mouth every 6 (six) hours as needed for up to 5 days for severe pain.  Dispense: 10 tablet; Refill: 0 - Ambulatory Referral For Surgery Scheduling  Return for Will call with results, Will call to schedule surgery.  Carman Ching, PA-C  [facility] [place] [address] [place], [place] [address] [phone]

## 2022-12-28 NOTE — Telephone Encounter (Signed)
I spoke with Pamela Booker. We have discussed possible surgery dates and Tuesday July 30th, 2024 was agreed upon by all parties. Patient given information about surgery date, what to expect pre-operatively and post operatively.  We discussed that a Pre-Admission Testing office will be calling to set up the pre-op visit that will take place prior to surgery, and that these appointments are typically done over the phone with a Pre-Admissions RN. Informed patient that our office will communicate any additional care to be provided after surgery. Patients questions or concerns were discussed during our call. Advised to call our office should there be any additional information, questions or concerns that arise. Patient verbalized understanding.

## 2022-12-28 NOTE — Patient Instructions (Addendum)
We are planning for ureteroscopy with Dr. Lonna Cobb next Tuesday, 7/30. This will most likely be on the left side, however if your CT scan shows you are passing a stone on the right and it hasn't passed by the time of surgery, we will operate on the right side instead.   Please go get your CT scan today and I'll call you with your results.  In the meantime, please do the following: -Continue Flomax 0.4mg  daily (refilled) -Stay well hydrated -Treat any pain with ibuprofen/tylenol or Percocet as prescribed today -Treat any nausea with Zofran (refilled)  Please call our office immediately (we are open 8a-5p Monday-Friday) or go to the Emergency Department if you develop any of the following: -Fever -Chills -Nausea and/or vomiting uncontrollable with Zofran -Pain uncontrollable with Percocet

## 2022-12-28 NOTE — Progress Notes (Signed)
12/28/2022 12:32 PM   [name] 1995-07-17 [id number]  CC: Chief Complaint  Patient presents with   Follow-up   Nephrolithiasis   HPI: Pamela Booker is a 27 y.o. female with PMH epilepsy with vagus nerve stimulator, endometriosis, and recurrent nephrolithiasis who presents today for evaluation of possible acute stone episode.  She spontaneously passed an approximate 4mm stone earlier this week but is having persistent bilateral flank pain and vaginal pressure, so she thinks she is still passing a stone. KUB earlier this week with no evidence of ureteral stone and UA was bland.   On most recent CT stone study dated 10/30/2022, she had multiple bilateral L>R renal stones.  She has never had a procedure for stone management before. She has spontaneously passed multiple stones. She is concerned about acute stone episodes increasing her risk for seizure. Additionally she is hoping to start a family with her fiance soon and would like to try to avoid acute stone episodes during pregnancy.  She recently finished menstruating and states there is no possibility of pregnancy today.  Additionally, she mailed in a 24-hour urine collection for metabolic workup recently, however a blood sample was not obtained and she passed a stone the day after collecting her sample.  In-office UA today positive for 1+ glucose; urine microscopy with >10 epithelial cells/hpf and many bacteria.   PMH: Past Medical History:  Diagnosis Date   Anxiety    Encounter for loop recorder check 06/10/2019   Epilepsy (HCC)    Hypertension    Loop: Biotronic loop recorder in situ 05/06/2019   Scheduled Remote loop recorder check 06/09/2019: This represents an unremarkable monitoring session. The presenting rhythm is not available. No symptomatic or automatic episodes were triggered. Daily activity trends are stable. Heart rate variability trends are stable.   Migraines    Palpitations    PVC  (premature ventricular contraction)    PVC's (premature ventricular contractions)    Seizures (HCC)    Syncope and collapse 06/10/2019    Surgical History: Past Surgical History:  Procedure Laterality Date   LAPAROSCOPY     TONSILLECTOMY     VAGUS NERVE STIMULATOR INSERTION      Home Medications:  Allergies as of 12/28/2022       Reactions   Nsaids Other (See Comments)   History of bleeding ulcers   Ativan [lorazepam] Itching   Compazine [prochlorperazine]    Diphenhydramine Other (See Comments)   "Crawling feeling under skin"   Ibuprofen    Other reaction(s): GI Upset (intolerance)   Keppra [levetiracetam] Other (See Comments)   "Makes her feel hungover"    Zolpidem Other (See Comments)   seizure   Emgality [galcanezumab-gnlm] Rash        Medication List        Accurate as of December 28, 2022 12:32 PM. If you have any questions, ask your nurse or doctor.          acebutolol 200 MG capsule Commonly known as: SECTRAL TAKE ONE CAPSULE BY MOUTH TWICE A DAY DISCONTINUE VERAPAMIL What changed:  how much to take how to take this when to take this   acetaminophen 325 MG tablet Commonly known as: TYLENOL Take by mouth.   Ambien 10 MG tablet Generic drug: zolpidem Ambien 10 mg   diazepam 5 MG tablet Commonly known as: VALIUM Take 5 mg by mouth 2 (two) times daily as needed for muscle spasms.   diazepam 5 MG tablet Commonly known as: VALIUM Take  by mouth.   escitalopram 20 MG tablet Commonly known as: LEXAPRO Take 20 mg by mouth daily.   fluticasone 50 MCG/ACT nasal spray Commonly known as: FLONASE Place 2 sprays into both nostrils daily.   Keppra 500 MG tablet Generic drug: levETIRAcetam Keppra 500 mg   LaMICtal 25 MG tablet Generic drug: lamoTRIgine LaMICtal 25 mg   lidocaine 2 % solution Commonly known as: XYLOCAINE USE AS DIRECTED 15 MLS IN THE MOUTH OR THROAT AS NEEDED FOR MOUTH PAIN. Mouth/Throat for 5 Days   LORazepam 1 MG  tablet Commonly known as: ATIVAN TAKE 1 TABLET BY MOUTH EVERY 6 HOURS AS NEEDED FOR ANXIETY. Oral for 2 Days   ondansetron 4 MG disintegrating tablet Commonly known as: ZOFRAN-ODT Take 1 tablet (4 mg total) by mouth every 8 (eight) hours as needed for nausea or vomiting.   oxcarbazepine 600 MG tablet Commonly known as: TRILEPTAL Take by mouth.   oxyCODONE-acetaminophen 5-325 MG tablet Commonly known as: PERCOCET/ROXICET Take 1-2 tablets by mouth every 6 (six) hours as needed for up to 5 days for severe pain.   phentermine 30 MG capsule Take 30 mg by mouth every morning.   Phexxi 1.8-1-0.4 % Gel Generic drug: Lactic Ac-Citric Ac-Pot Bitart SMARTSIG:1 Application Vaginal Once PRN   rizatriptan 10 MG disintegrating tablet Commonly known as: MAXALT-MLT Take 10 mg by mouth daily as needed for migraine.   sertraline 100 MG tablet Commonly known as: ZOLOFT sertraline 100 mg   tamsulosin 0.4 MG Caps capsule Commonly known as: FLOMAX Take 1 capsule (0.4 mg total) by mouth daily.   tiZANidine 4 MG tablet Commonly known as: ZANAFLEX Take 4-8 mg by mouth as needed for muscle spasms.   topiramate 100 MG tablet Commonly known as: TOPAMAX Take 100 mg by mouth daily.   valACYclovir 500 MG tablet Commonly known as: VALTREX Take 500 mg by mouth daily.   Xcopri 200 MG Tabs Generic drug: Cenobamate 1 tablet Orally Once a day for 30 days        Allergies:  Allergies  Allergen Reactions   Nsaids Other (See Comments)    History of bleeding ulcers   Ativan [Lorazepam] Itching   Compazine [Prochlorperazine]    Diphenhydramine Other (See Comments)    "Crawling feeling under skin"   Ibuprofen     Other reaction(s): GI Upset (intolerance)   Keppra [Levetiracetam] Other (See Comments)    "Makes her feel hungover"    Zolpidem Other (See Comments)    seizure   Emgality [Galcanezumab-Gnlm] Rash    Family History: Family History  Adopted: Yes  Problem Relation Age of Onset    Healthy Daughter     Social History:   reports that she has never smoked. She has never used smokeless tobacco. She reports current alcohol use. She reports that she does not use drugs.  Physical Exam: BP 135/84   Pulse (!) 116   Temp 97.8 F (36.6 C)   Ht 5' (1.524 m)   Wt 137 lb (62.1 kg)   LMP 11/08/2022 (Approximate)   BMI 26.76 kg/m   Constitutional:  Alert and oriented, uncomfortable appearing HEENT: [place], AT Cardiovascular: No clubbing, cyanosis, or edema Respiratory: Normal respiratory effort, no increased work of breathing Skin: No rashes, bruises or suspicious lesions Neurologic: Grossly intact, no focal deficits, moving all 4 extremities Psychiatric: Normal mood and affect  Laboratory Data: Results for orders placed or performed in visit on 12/28/22  Microscopic Examination   Urine  Result Value Ref Range  WBC, UA 0-5 0 - 5 /hpf   RBC, Urine 0-2 0 - 2 /hpf   Epithelial Cells (non renal) >10 (A) 0 - 10 /hpf   Mucus, UA Present (A) Not Estab.   Bacteria, UA Many (A) None seen/Few  Urinalysis, Complete  Result Value Ref Range   Specific Gravity, UA 1.025 1.005 - 1.030   pH, UA 5.5 5.0 - 7.5   Color, UA Yellow Yellow   Appearance Ur Clear Clear   Leukocytes,UA Negative Negative   Protein,UA Negative Negative/Trace   Glucose, UA 1+ (A) Negative   Ketones, UA Negative Negative   RBC, UA Negative Negative   Bilirubin, UA Negative Negative   Urobilinogen, Ur 0.2 0.2 - 1.0 mg/dL   Nitrite, UA Negative Negative   Microscopic Examination See below:    Pertinent Imaging: Results for orders placed during the hospital encounter of 12/25/22  Abdomen 1 view (KUB)  Narrative CLINICAL DATA:  Nephrolithiasis.  EXAM: ABDOMEN - 1 VIEW  COMPARISON:  CT 10/30/2022.  FINDINGS: The bowel gas pattern is normal. A few calcifications measuring 3-4 mm overlie the left kidney. Right-sided stones are obscured by overlying stool  shadows.  IMPRESSION: Nephrolithiasis.   Electronically Signed By: Layla Maw M.D. On: 12/25/2022 18:16   I personally reviewed the images referenced above and note no radiopaque ureteral stones on KUB.  Assessment & Plan:   1. Nephrolithiasis Multiple known bilateral renal stones L>R. Persistent vaginal pressure and bilateral flank pain after passing a stone earlier this week. UA is bland, but contaminated appearing.  With numerous renal stones, concern regarding seizures, and desire for pregnancy, we discussed pursuing definitive stone management on an elective basis and she would like to do this. I do not recommend ESWL due to the number of stones and she agrees. We discussed ureteroscopy with laser lithotripsy and stent placement as an alternative.  We discussed URS as an outpatient procedure and the risks including bleeding, infection, and damage to surrounding structures. We discussed potential severe discomfort with ureteral stents and whether or not to proceed with bilateral URS versus taking a staged approach. She prefers the latter, which is reasonable. We also discussed the risk of not fully clearing her stone burden as well as the need to place a ureteral stent for dilation 2 weeks prior to stone removal if her collecting systems are not accessible at the time of planned procedure. She understands these risks and wishes to proceed.  With greater stone burden on the left, we will plan for left URS next week with Dr. Lonna Cobb, pending STAT CT stone study today. If CT shows a right ureteral stone and she has not passed it by the time of surgery, we will perform right URS instead. She is in agreement with this plan.  Preop UA/Cx sent; Flomax, Zofran, and Percocet prescribed for active stone episode; booking sheet for next week completed. All questions answered.  Will need to repeat metabolic workup after completion of staged bilateral ureteroscopy as above.  ADDENDUM: No acute  stone episode seen on CT today. Will proceed with LEFT URS/LL/stent placement with Dr. Lonna Cobb on 7/30. - Urinalysis, Complete - CULTURE, URINE COMPREHENSIVE - CT RENAL STONE STUDY; Future - tamsulosin (FLOMAX) 0.4 MG CAPS capsule; Take 1 capsule (0.4 mg total) by mouth daily.  Dispense: 30 capsule; Refill: 0 - ondansetron (ZOFRAN-ODT) 4 MG disintegrating tablet; Take 1 tablet (4 mg total) by mouth every 8 (eight) hours as needed for nausea or vomiting.  Dispense: 20 tablet;  Refill: 0 - oxyCODONE-acetaminophen (PERCOCET/ROXICET) 5-325 MG tablet; Take 1-2 tablets by mouth every 6 (six) hours as needed for up to 5 days for severe pain.  Dispense: 10 tablet; Refill: 0 - Ambulatory Referral For Surgery Scheduling  Return for Will call with results, Will call to schedule surgery.  Carman Ching, PA-C  [facility] [place] [address] [place], [place] [address] [phone]

## 2022-12-28 NOTE — Progress Notes (Signed)
   [facility] Surgical Posting Form  Surgery Date: Date: 01/02/2023  Surgeon: Dr. Irineo Axon, MD  Inpt ( No  )   Outpt (Yes)   Obs ( No  )   Diagnosis: N20.0 Left Nephrolithiasis  -CPT: [phone]  Surgery: Left Ureteroscopy with Laser Lithotripsy and Stent Placement  Stop Anticoagulations: N/A  Cardiac/Medical/Pulmonary Clearance needed: yes  Clearance needed from Dr: Neurology due to VNS, sent by Quentin Mulling, NP  Clearance request sent on: Date: 12/28/22  *Orders entered into EPIC  Date: 12/28/22   *Case booked in EPIC  Date: 12/28/22  *Notified pt of Surgery: Date: 12/28/22  PRE-OP UA & CX: no  *Placed into Prior Authorization Work [place] Date: 12/28/22  Assistant/laser/rep:No

## 2023-01-01 ENCOUNTER — Encounter
Admission: RE | Admit: 2023-01-01 | Discharge: 2023-01-01 | Disposition: A | Discharge status: Home / Self Care | Payer: 59 | Source: Ambulatory Visit | Attending: Urology | Admitting: Urology

## 2023-01-01 DIAGNOSIS — Z01818 Encounter for other preprocedural examination: Secondary | ICD-10-CM

## 2023-01-01 HISTORY — DX: Unspecified ovarian cyst, unspecified side: N83.209

## 2023-01-01 HISTORY — DX: Personal history of urinary calculi: Z87.442

## 2023-01-01 HISTORY — DX: Depression, unspecified: F32.A

## 2023-01-01 HISTORY — DX: Gastro-esophageal reflux disease without esophagitis: K21.9

## 2023-01-01 HISTORY — DX: Endometriosis, unspecified: N80.9

## 2023-01-01 HISTORY — DX: Herpesviral infection, unspecified: B00.9

## 2023-01-01 HISTORY — DX: Nontoxic single thyroid nodule: E04.1

## 2023-01-01 MED ORDER — ORAL CARE MOUTH RINSE: 15.0000 mL | Freq: Once | OROMUCOSAL | Status: AC

## 2023-01-01 MED ORDER — LACTATED RINGERS IV SOLN: INTRAVENOUS | Status: DC

## 2023-01-01 MED ORDER — CHLORHEXIDINE GLUCONATE 0.12 % MT SOLN
15.0000 mL | Freq: Once | OROMUCOSAL | Status: AC
  Administered 2023-01-02: 15 mL via OROMUCOSAL

## 2023-01-01 MED ORDER — CEFAZOLIN SODIUM-DEXTROSE 2-4 GM/100ML-% IV SOLN
2.0000 g | INTRAVENOUS | Status: AC
  Administered 2023-01-02: 2 g via INTRAVENOUS

## 2023-01-01 NOTE — Patient Instructions (Addendum)
Your procedure is scheduled on:01-02-23 Tuesday Report to the Registration Desk on the 1st floor of the Medical Mall.Then proceed to the 2nd floor Surgery Desk To find out your arrival time, please call [phone] between 1PM - 3PM on:01-01-23 Monday If your arrival time is 6:00 am, do not arrive before that time as the Medical Mall entrance doors do not open until 6:00 am.  REMEMBER: Instructions that are not followed completely may result in serious medical risk, up to and including death; or upon the discretion of your surgeon and anesthesiologist your surgery may need to be rescheduled.  Do not eat food OR drink any liquids after midnight the night before surgery.  No gum chewing or hard candies.  One week prior to surgery: Stop Anti-inflammatories (NSAIDS) such as Advil, Aleve, Ibuprofen, Motrin, Naproxen, Naprosyn and Aspirin based products such as Excedrin, Goody's Powder, BC Powder. You may however, continue to take Tylenol/Percocet if needed for pain up until the day of surgery.  Continue taking all prescribed medications with the exception of the following: -Last dose of Phentermine was last week (app.12-25-22)  TAKE ONLY THESE MEDICATIONS THE MORNING OF SURGERY WITH A SIP OF WATER: -acebutolol (SECTRAL)  -topiramate (TOPAMAX)  -Famotidine (PEPCID)-take one the night before and one on the morning of surgery - helps to prevent nausea after surgery.) -You may take diazepam (VALIUM)  if needed for anxiety  No Alcohol for 24 hours before or after surgery.  No Smoking including e-cigarettes for 24 hours before surgery.  No chewable tobacco products for at least 6 hours before surgery.  No nicotine patches on the day of surgery.  Do not use any "recreational" drugs for at least a week (preferably 2 weeks) before your surgery.  Please be advised that the combination of cocaine and anesthesia may have negative outcomes, up to and including death. If you test positive for cocaine,  your surgery will be cancelled.  On the morning of surgery brush your teeth with toothpaste and water, you may rinse your mouth with mouthwash if you wish. Do not swallow any toothpaste or mouthwash.  Do not wear jewelry, make-up, hairpins, clips or nail polish.  Do not wear lotions, powders, or perfumes.   Do not shave body hair from the neck down 48 hours before surgery.  Contact lenses, hearing aids and dentures may not be worn into surgery.  Do not bring valuables to the hospital. [facility] is not responsible for any missing/lost belongings or valuables.   Notify your doctor if there is any change in your medical condition (cold, fever, infection).  Wear comfortable clothing (specific to your surgery type) to the hospital.  After surgery, you can help prevent lung complications by doing breathing exercises.  Take deep breaths and cough every 1-2 hours. Your doctor may order a device called an Incentive Spirometer to help you take deep breaths. When coughing or sneezing, hold a pillow firmly against your incision with both hands. This is called "splinting." Doing this helps protect your incision. It also decreases belly discomfort.  If you are being admitted to the hospital overnight, leave your suitcase in the car. After surgery it may be brought to your room.  In case of increased patient census, it may be necessary for you, the patient, to continue your postoperative care in the Same Day Surgery department.  If you are being discharged the day of surgery, you will not be allowed to drive home. You will need a responsible individual to drive you  home and stay with you for 24 hours after surgery.   If you are taking public transportation, you will need to have a responsible individual with you.  Please call the Pre-admissions Testing Dept. at [phone] if you have any questions about these instructions.  Surgery Visitation Policy:  Patients having surgery or a procedure  may have two visitors.  Children under the age of 85 must have an adult with them who is not the patient.  Bring your magnet for your Stimulator

## 2023-01-02 ENCOUNTER — Ambulatory Visit: Payer: 59 | Admitting: General Practice

## 2023-01-02 ENCOUNTER — Ambulatory Visit
Admission: RE | Admit: 2023-01-02 | Discharge: 2023-01-02 | Disposition: A | Discharge status: Home / Self Care | Payer: 59 | Attending: Urology | Admitting: Urology

## 2023-01-02 ENCOUNTER — Encounter
Admission: RE | Disposition: A | Discharge status: Home / Self Care | Payer: Self-pay | Source: Home / Self Care | Attending: Urology

## 2023-01-02 ENCOUNTER — Encounter: Payer: Self-pay | Admitting: Urology

## 2023-01-02 ENCOUNTER — Other Ambulatory Visit: Payer: Self-pay

## 2023-01-02 ENCOUNTER — Ambulatory Visit: Payer: 59

## 2023-01-02 DIAGNOSIS — N200 Calculus of kidney: Secondary | ICD-10-CM | POA: Diagnosis not present

## 2023-01-02 DIAGNOSIS — G40909 Epilepsy, unspecified, not intractable, without status epilepticus: Secondary | ICD-10-CM | POA: Diagnosis not present

## 2023-01-02 DIAGNOSIS — Z01818 Encounter for other preprocedural examination: Secondary | ICD-10-CM

## 2023-01-02 HISTORY — PX: CYSTOSCOPY/URETEROSCOPY/HOLMIUM LASER/STENT PLACEMENT: SHX6546

## 2023-01-02 SURGERY — CYSTOSCOPY/URETEROSCOPY/HOLMIUM LASER/STENT PLACEMENT
Anesthesia: General | Site: Urethra | Laterality: Left

## 2023-01-02 MED ORDER — MIDAZOLAM HCL 2 MG/2ML IJ SOLN
INTRAMUSCULAR | Status: DC | PRN
  Administered 2023-01-02: 2 mg via INTRAVENOUS

## 2023-01-02 MED ORDER — OXYCODONE HCL 5 MG/5ML PO SOLN: 5.0000 mg | Freq: Once | ORAL | Status: AC | PRN

## 2023-01-02 MED ORDER — DEXAMETHASONE SODIUM PHOSPHATE 10 MG/ML IJ SOLN
INTRAMUSCULAR | Status: DC | PRN
  Administered 2023-01-02: 5 mg via INTRAVENOUS

## 2023-01-02 MED ORDER — PROPOFOL 10 MG/ML IV BOLUS
INTRAVENOUS | Status: AC
  Filled 2023-01-02: qty 20

## 2023-01-02 MED ORDER — OXYCODONE HCL 5 MG PO TABS
5.0000 mg | ORAL_TABLET | Freq: Once | ORAL | Status: AC | PRN
  Administered 2023-01-02: 5 mg via ORAL

## 2023-01-02 MED ORDER — LIDOCAINE HCL (CARDIAC) PF 100 MG/5ML IV SOSY
PREFILLED_SYRINGE | INTRAVENOUS | Status: DC | PRN
  Administered 2023-01-02: 60 mg via INTRAVENOUS

## 2023-01-02 MED ORDER — PROPOFOL 10 MG/ML IV BOLUS
INTRAVENOUS | Status: DC | PRN
  Administered 2023-01-02: 150 mg via INTRAVENOUS

## 2023-01-02 MED ORDER — OXYBUTYNIN CHLORIDE 5 MG PO TABS: ORAL_TABLET | ORAL | 0 refills | Status: DC

## 2023-01-02 MED ORDER — MIDAZOLAM HCL 2 MG/2ML IJ SOLN
INTRAMUSCULAR | Status: AC
  Filled 2023-01-02: qty 2

## 2023-01-02 MED ORDER — SUGAMMADEX SODIUM 200 MG/2ML IV SOLN
INTRAVENOUS | Status: DC | PRN
  Administered 2023-01-02: 200 mg via INTRAVENOUS

## 2023-01-02 MED ORDER — ONDANSETRON HCL 4 MG/2ML IJ SOLN
INTRAMUSCULAR | Status: AC
  Filled 2023-01-02: qty 2

## 2023-01-02 MED ORDER — CHLORHEXIDINE GLUCONATE 0.12 % MT SOLN
OROMUCOSAL | Status: AC
  Filled 2023-01-02: qty 15

## 2023-01-02 MED ORDER — DEXAMETHASONE SODIUM PHOSPHATE 10 MG/ML IJ SOLN
INTRAMUSCULAR | Status: AC
  Filled 2023-01-02: qty 1

## 2023-01-02 MED ORDER — CEFAZOLIN SODIUM-DEXTROSE 2-4 GM/100ML-% IV SOLN
INTRAVENOUS | Status: AC
  Filled 2023-01-02: qty 100

## 2023-01-02 MED ORDER — FENTANYL CITRATE (PF) 100 MCG/2ML IJ SOLN
INTRAMUSCULAR | Status: DC | PRN
  Administered 2023-01-02 (×2): 50 ug via INTRAVENOUS

## 2023-01-02 MED ORDER — OXYCODONE-ACETAMINOPHEN 5-325 MG PO TABS
1.0000 | ORAL_TABLET | Freq: Four times a day (QID) | ORAL | 0 refills | Status: AC | PRN
Start: 2023-01-02 — End: 2023-01-07

## 2023-01-02 MED ORDER — IOHEXOL 180 MG/ML  SOLN
INTRAMUSCULAR | Status: DC | PRN
  Administered 2023-01-02: 20 mL

## 2023-01-02 MED ORDER — SODIUM CHLORIDE 0.9 % IR SOLN
Status: DC | PRN
  Administered 2023-01-02: 3000 mL

## 2023-01-02 MED ORDER — OXYCODONE HCL 5 MG PO TABS
ORAL_TABLET | ORAL | Status: AC
  Filled 2023-01-02: qty 1

## 2023-01-02 MED ORDER — FENTANYL CITRATE (PF) 100 MCG/2ML IJ SOLN
INTRAMUSCULAR | Status: AC
  Filled 2023-01-02: qty 2

## 2023-01-02 MED ORDER — ROCURONIUM BROMIDE 100 MG/10ML IV SOLN
INTRAVENOUS | Status: DC | PRN
  Administered 2023-01-02: 50 mg via INTRAVENOUS

## 2023-01-02 MED ORDER — ONDANSETRON HCL 4 MG/2ML IJ SOLN
INTRAMUSCULAR | Status: DC | PRN
  Administered 2023-01-02: 4 mg via INTRAVENOUS

## 2023-01-02 MED ORDER — STERILE WATER FOR IRRIGATION IR SOLN
Status: DC | PRN
  Administered 2023-01-02: 500 mL

## 2023-01-02 MED ORDER — FENTANYL CITRATE (PF) 100 MCG/2ML IJ SOLN: 25.0000 ug | INTRAMUSCULAR | Status: DC | PRN

## 2023-01-02 MED ORDER — TAMSULOSIN HCL 0.4 MG PO CAPS: 0.4000 mg | ORAL_CAPSULE | Freq: Every day | ORAL | 0 refills | Status: DC

## 2023-01-02 SURGICAL SUPPLY — 28 items
BAG DRAIN SIEMENS DORNER NS (MISCELLANEOUS) ×1 IMPLANT
BAG DRN NS LF (MISCELLANEOUS) ×1
BASKET ZERO TIP 1.9FR (BASKET) IMPLANT
BRUSH SCRUB EZ 1% IODOPHOR (MISCELLANEOUS) ×1 IMPLANT
BSKT STON RTRVL ZERO TP 1.9FR (BASKET)
CATH URET FLEX-TIP 2 LUMEN 10F (CATHETERS) IMPLANT
CATH URETL OPEN END 6X70 (CATHETERS) IMPLANT
CNTNR URN SCR LID CUP LEK RST (MISCELLANEOUS) IMPLANT
CONT SPEC 4OZ STRL OR WHT (MISCELLANEOUS)
DRAPE UTILITY 15X26 TOWEL STRL (DRAPES) ×1 IMPLANT
FIBER LASER MOSES 200 DFL (Laser) IMPLANT
GLOVE BIOGEL PI IND STRL 7.5 (GLOVE) ×1 IMPLANT
GOWN STRL REUS W/ TWL LRG LVL3 (GOWN DISPOSABLE) ×1 IMPLANT
GOWN STRL REUS W/ TWL XL LVL3 (GOWN DISPOSABLE) ×1 IMPLANT
GOWN STRL REUS W/TWL LRG LVL3 (GOWN DISPOSABLE) ×1
GOWN STRL REUS W/TWL XL LVL3 (GOWN DISPOSABLE) ×1
GUIDEWIRE GREEN .038 145CM (MISCELLANEOUS) IMPLANT
GUIDEWIRE STR DUAL SENSOR (WIRE) ×1 IMPLANT
IV NS IRRIG 3000ML ARTHROMATIC (IV SOLUTION) ×1 IMPLANT
KIT TURNOVER CYSTO (KITS) ×1 IMPLANT
PACK CYSTO AR (MISCELLANEOUS) ×1 IMPLANT
SET CYSTO W/LG BORE CLAMP LF (SET/KITS/TRAYS/PACK) ×1 IMPLANT
SHEATH NAVIGATOR HD 12/14X36 (SHEATH) IMPLANT
STENT URET 6FRX24 CONTOUR (STENTS) IMPLANT
STENT URET 6FRX26 CONTOUR (STENTS) IMPLANT
SURGILUBE 2OZ TUBE FLIPTOP (MISCELLANEOUS) ×1 IMPLANT
VALVE UROSEAL ADJ ENDO (VALVE) IMPLANT
WATER STERILE IRR 500ML POUR (IV SOLUTION) ×1 IMPLANT

## 2023-01-02 NOTE — Discharge Instructions (Addendum)
DISCHARGE INSTRUCTIONS FOR KIDNEY STONE/URETERAL STENT   MEDICATIONS:  1. Resume all your other meds from home.  2.  AZO (over-the-counter) can help with the burning/stinging when you urinate. 3.  Oxycodone is for moderate/severe pain, Rx was sent to your pharmacy. 4.  Prescriptions for oxybutynin and tamsulosin which will help with stent/bladder irritation was sent to your pharmacy  ACTIVITY:  1. May resume regular activities in 24 hours. 2. No driving while on narcotic pain medications  3. Drink plenty of water  4. Continue to walk at home - you can still get blood clots when you are at home, so keep active, but don't over do it.  5. May return to work/school tomorrow or when you feel ready    SIGNS/SYMPTOMS TO CALL:  Common postoperative symptoms include urinary frequency, urgency, bladder spasm and blood in the urine  Please call us if you have a fever greater than 101.5, uncontrolled nausea/vomiting, uncontrolled pain, dizziness, unable to urinate, excessively bloody urine, chest pain, shortness of breath, leg swelling, leg pain, or any other concerns or questions.   You can reach [place] at [phone].   FOLLOW-UP:  1. You will be contacted for a follow-up appointment  AMBULATORY SURGERY  DISCHARGE INSTRUCTIONS  The drugs that you were given will stay in your system until tomorrow so for the next 24 hours you should not:  Drive an automobile Make any legal decisions Drink any alcoholic beverage  You may resume regular meals tomorrow.  Today it is better to start with liquids and gradually work up to solid foods.  You may eat anything you prefer, but it is better to start with liquids, then soup and crackers, and gradually work up to solid foods.  Please notify your doctor immediately if you have any unusual bleeding, trouble breathing, redness and pain at the surgery site, drainage, fever, or pain not relieved by medication.  Additional Instructions:  Please contact your  physician with any problems or Same Day Surgery at [phone], Monday through Friday 6 am to 4 pm, or [place] at [facility] number at [phone].

## 2023-01-02 NOTE — Interval H&P Note (Signed)
History and Physical Interval Note:  CV:RRR Lungs:clear  01/02/2023 12:50 PM  Pamela Booker  has presented today for surgery, with the diagnosis of Left Nephrolithiasis.  The various methods of treatment have been discussed with the patient and family. After consideration of risks, benefits and other options for treatment, the patient has consented to  Procedure(s): CYSTOSCOPY/URETEROSCOPY/HOLMIUM LASER/STENT PLACEMENT (Left) as a surgical intervention.  The patient's history has been reviewed, patient examined, no change in status, stable for surgery.  I have reviewed the patient's chart and labs.  Questions were answered to the patient's satisfaction.      C 

## 2023-01-02 NOTE — Op Note (Signed)
Preoperative diagnosis: Left nephrolithiasis   Postoperative diagnosis: Left nephrolithiasis  Procedure:  Cystoscopy Left ureteroscopy and stone removal Ureteroscopic laser lithotripsy Left ureteral stent placement (90F/24 cm)  Left retrograde pyelography with interpretation  Surgeon: Lorin Picket C. , M.D.  Anesthesia: General  Complications: None  Intraoperative findings:  Cystoscopy-bladder mucosa normal in appearance without erythema, solid or papillary lesions.  UOs normal-appearing bilaterally Left ureteropyeloscopy-ureter without mucosal lesions, stricture or calculi.  Pyeloscopy remarkable for 3 upper calyceal calculi measuring ~ 3 mm and 2 lower pole calculi measuring 2 and 5 mm.  All calculi were dusted with the holmium laser at a setting of 0.3J/80 Hz  Left retrograde pyelography post procedure showed no filling defects, stone fragments or contrast extravasation  EBL: Minimal  Specimens: None   Indication: Pamela Booker is a 27 y.o. female with recurrent nephrolithiasis and CT showing nonobstructing bilateral renal calculi measuring up to 4 mm.  She has passed multiple calculi with significant renal colic at times and has requested staged ureteroscopy.  After reviewing the management options for treatment, the patient elected to proceed with the above surgical procedure(s). We have discussed the potential benefits and risks of the procedure, side effects of the proposed treatment, the likelihood of the patient achieving the goals of the procedure, and any potential problems that might occur during the procedure or recuperation. Informed consent has been obtained.  Description of procedure:  The patient was taken to the operating room and general anesthesia was induced.  The patient was placed in the dorsal lithotomy position, prepped and draped in the usual sterile fashion, and preoperative antibiotics were administered. A preoperative time-out was performed.    A 21 French cystoscope was lubricated and passed and passed per urethra.  Panendoscopy was performed with findings as described above.  Attention was directed to the left ureteral orifice and a 0.038 Sensor wire was then advanced up the ureter into the renal pelvis under fluoroscopic guidance.  The cystoscope was removed and a dual-lumen catheter was placed over the Sensor wire and a second sensor wire was placed in a similar fashion followed by removal of the dual-lumen catheter.  A single channel digital flexible ureteroscope was over the working wire and easily advanced into the lower proximal ureter.  The guidewire was removed and the ureteroscope was advanced into the renal pelvis.  Pyeloscopy was performed with findings as described above.  All calculi were dusted with a 200 m Moses holmium laser fiber.  At completion of the procedure retrograde pyelogram was performed through the ureteroscope and all calyces were examined.  There were a few fragments slightly larger than the tip of the laser fiber noted in the lower pole calyx which were further dusted with noncontact laser lithotripsy.  Repeat pyeloscopy showed no fragments larger than the tip of the laser fiber.  The ureteroscope was then removed under direct vision and no abnormalities were noted.  A 90F/24 cm Contour stent was placed under fluoroscopic guidance.  The wire was then removed with an adequate stent curl noted in the renal pelvis as well as in the bladder.  The bladder was then emptied and the procedure ended.  The patient appeared to tolerate the procedure well and without complications.  After anesthetic reversal the patient was transported to the PACU in stable condition.   Plan: Will discuss with patient scheduling right ureteroscopy in 1 week and removal of her left ureteral stent with second look left ureteroscopy under the same anesthetic  Irineo Axon,  MD

## 2023-01-02 NOTE — Anesthesia Procedure Notes (Signed)
Procedure Name: Intubation Date/Time: 01/02/2023 12:53 PM  Performed by: Monico Hoar, CRNAPre-anesthesia Checklist: Patient identified, Patient being monitored, Timeout performed, Emergency Drugs available and Suction available Patient Re-evaluated:Patient Re-evaluated prior to induction Oxygen Delivery Method: Circle system utilized Preoxygenation: Pre-oxygenation with 100% oxygen Induction Type: IV induction Ventilation: Mask ventilation without difficulty Laryngoscope Size: 4 and McGraph Grade View: Grade I Tube type: Oral Tube size: 7.0 mm Number of attempts: 1 Airway Equipment and Method: Stylet Placement Confirmation: ETT inserted through vocal cords under direct vision, positive ETCO2 and breath sounds checked- equal and bilateral Secured at: 21 cm Tube secured with: Tape Dental Injury: Teeth and Oropharynx as per pre-operative assessment

## 2023-01-02 NOTE — Transfer of Care (Signed)
Immediate Anesthesia Transfer of Care Note  Patient: Pamela Booker  Procedure(s) Performed: CYSTOSCOPY/URETEROSCOPY/HOLMIUM LASER/STENT PLACEMENT (Left: Urethra)  Patient Location: PACU  Anesthesia Type:General  Level of Consciousness: awake  Airway & Oxygen Therapy: Patient Spontanous Breathing  Post-op Assessment: Report given to RN and Post -op Vital signs reviewed and stable  Post vital signs: Reviewed and stable  Last Vitals:  Vitals Value Taken Time  BP 122/89 01/02/23 1409  Temp    Pulse 76 01/02/23 1411  Resp 21 01/02/23 1411  SpO2 98 % 01/02/23 1411  Vitals shown include unfiled device data.  Last Pain:  Vitals:   01/02/23 1213  TempSrc: Oral  PainSc: 0-No pain         Complications: No notable events documented.

## 2023-01-02 NOTE — Anesthesia Preprocedure Evaluation (Signed)
Anesthesia Evaluation  Patient identified by MRN, date of birth, ID band Patient awake    Reviewed: Allergy & Precautions, NPO status , Patient's Chart, lab work & pertinent test results  Airway Mallampati: I  TM Distance: >3 FB Neck ROM: full    Dental  (+) Chipped   Pulmonary neg pulmonary ROS, neg shortness of breath   Pulmonary exam normal        Cardiovascular hypertension, negative cardio ROS Normal cardiovascular exam     Neuro/Psych  PSYCHIATRIC DISORDERS Anxiety Depression    negative neurological ROS     GI/Hepatic Neg liver ROS,GERD  Medicated,,  Endo/Other  negative endocrine ROS    Renal/GU      Musculoskeletal   Abdominal   Peds  Hematology negative hematology ROS (+)   Anesthesia Other Findings Past Medical History: No date: Anxiety No date: Depression No date: Endometriosis No date: Epilepsy (HCC) No date: GERD (gastroesophageal reflux disease) No date: History of kidney stones 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 No date: Hypertension No date: Migraines No date: Ovarian cyst No date: Palpitations No date: PVC (premature ventricular contraction) No date: S/P placement of nerve stimulator     Comment:  VNS No date: Seizures (HCC)     Comment:  last seizure was 2 weeks ago (app.12-18-22) 06/10/2019: Syncope and collapse No date: Thyroid nodule  Past Surgical History: 06/2022: ENDOMETRIAL ABLATION     Comment:  [facility] No date: LAPAROSCOPY     Comment:  x5 for endometriosis 2020: LOOP RECORDER INSERTION 2021: LOOP RECORDER REMOVAL No date: TONSILLECTOMY 2022: VAGUS NERVE STIMULATOR INSERTION  BMI    Body Mass Index: 24.69 kg/m      Reproductive/Obstetrics negative OB ROS                             Anesthesia Physical Anesthesia Plan  ASA: 2  Anesthesia Plan: General ETT   Post-op Pain Management:    Induction: Intravenous  PONV Risk Score and Plan: 4 or  greater and Ondansetron and Dexamethasone  Airway Management Planned: Oral ETT  Additional Equipment:   Intra-op Plan:   Post-operative Plan: Extubation in OR  Informed Consent: I have reviewed the patients History and Physical, chart, labs and discussed the procedure including the risks, benefits and alternatives for the proposed anesthesia with the patient or authorized representative who has indicated his/her understanding and acceptance.     Dental Advisory Given  Plan Discussed with: Anesthesiologist, CRNA and Surgeon  Anesthesia Plan Comments: (Patient consented for risks of anesthesia including but not limited to:  - adverse reactions to medications - damage to eyes, teeth, lips or other oral mucosa - nerve damage due to positioning  - sore throat or hoarseness - Damage to heart, brain, nerves, lungs, other parts of body or loss of life  Patient voiced understanding.)       Anesthesia Quick Evaluation

## 2023-01-03 ENCOUNTER — Encounter: Payer: Self-pay | Admitting: Urology

## 2023-01-03 ENCOUNTER — Telehealth: Payer: Self-pay

## 2023-01-03 NOTE — Telephone Encounter (Signed)
If she wants to go ahead and get her right-sided stones treated can schedule next week (8/5) and remove her left sided stent at that time.  Let me know and I will send orders to [facility].  If she wants to hold off on her right-sided stones can schedule office stent removal in 1 week

## 2023-01-03 NOTE — Telephone Encounter (Signed)
She would like to proceed with treating stones on the RT side.

## 2023-01-03 NOTE — Telephone Encounter (Signed)
Pt calls triage line and states that she is calling to find out the next steps for having her stent removed and scheduling her next surgery for stone removal so that she can give her job adequate notice. Please advise.

## 2023-01-04 ENCOUNTER — Telehealth: Payer: Self-pay | Admitting: *Deleted

## 2023-01-04 ENCOUNTER — Other Ambulatory Visit: Payer: Self-pay

## 2023-01-04 ENCOUNTER — Telehealth: Payer: Self-pay

## 2023-01-04 ENCOUNTER — Other Ambulatory Visit: Payer: Self-pay | Admitting: Urology

## 2023-01-04 DIAGNOSIS — N200 Calculus of kidney: Secondary | ICD-10-CM

## 2023-01-04 NOTE — Anesthesia Postprocedure Evaluation (Signed)
Anesthesia Post Note  Patient: Pamela Booker  Procedure(s) Performed: CYSTOSCOPY/URETEROSCOPY/HOLMIUM LASER/STENT PLACEMENT (Left: Urethra)  Patient location during evaluation: PACU Anesthesia Type: General Level of consciousness: awake and alert Pain management: pain level controlled Vital Signs Assessment: post-procedure vital signs reviewed and stable Respiratory status: spontaneous breathing, nonlabored ventilation, respiratory function stable and patient connected to nasal cannula oxygen Cardiovascular status: blood pressure returned to baseline and stable Postop Assessment: no apparent nausea or vomiting Anesthetic complications: no   No notable events documented.   Last Vitals:  Vitals:   01/02/23 1435 01/02/23 1505  BP:  126/73  Pulse: 69 65  Resp: 17 18  Temp:  (!) 36 C  SpO2: 95% 100%    Last Pain:  Vitals:   01/02/23 1505  TempSrc: Temporal  PainSc: 4                  [name]

## 2023-01-04 NOTE — Telephone Encounter (Signed)
Would do a lab visit for urine culture but at the [facility] lab

## 2023-01-04 NOTE — Progress Notes (Signed)
Surgical Physician Order Form [facility]  Dr. Irineo Axon, MD  * Scheduling expectation : 01/09/2023  *Length of Case: 75 minutes  *Clearance needed: no  *Anticoagulation Instructions: N/A  *Aspirin Instructions: N/A  *Post-op visit Date/Instructions:  1 week cysto stent removal  *Diagnosis: Bilateral nephrolithiasis  *Procedure: Left ureteroscopy with removal left ureteral stent; right ureteroscopy with laser lithotripsy/stone removal; placement right ureteral stent   Additional orders: N/A  -Admit type: Outpatient  -Anesthesia: General  -VTE Prophylaxis Standing Order SCD's       Other:   -Standing Lab Orders Per Anesthesia    Lab other: None  -Standing Test orders EKG/Chest x-ray per Anesthesia       Test other:   - Medications:  Ceftriaxone(Rocephin) 1gm IV  -Other orders:  N/A

## 2023-01-04 NOTE — Telephone Encounter (Signed)
Spoke with patient and she feels much better now that she took the medications. She will call if anything changes

## 2023-01-04 NOTE — Telephone Encounter (Signed)
Pt calling stating that she had stent placement on Tuesday. Pt states she's having pain " my kidney throbbing" also she has a temp of 99.9. pt states she is still taking flomax and oxybutynin. I advised to add in some tylenol. Any further advice? Should she be seen?  Tips for tolerating your ureteral stent after your urologic procedure Your urologist placed a stent into your ureter (the tube that connects your kidney to your bladder). This ureteral stent functions much like a tiny straw that has as small coil in the kidney and in the bladder to help hold it in place and to keep your ureter open until the healing process is complete. If the stent is removed prior to the resolution of swelling, you may experience significant discomfort. This pain is very similar to the pain experienced with a kidney or ureteral stone. Therefore, we urge you to leave the stent in for the prescribed amount of time.  To improve the tolerability of the stent, several medications are available.   This is a medication that helps relieve bladder cramps and spasms often experienced with a ureteral stent.    Frequently Asked Questions about Ureteral Stents: Q Is it normal to have discomfort present? A Yes. In fact, certain movements may increase discomfort. Examples include: frequent bending and increased activity. Q Is it normal to feel like I have to urinate more often? A Yes,  Q Is it normal to feel pain or pressure before and/or during urination? A Yes, some patients have significant pain in the back during urination because the stent allows urine to flow back toward the kidney. Others may experience pain in the bladder because the stent may cause a bladder cramp during or at the end of urination. Q Is it normal to have blood in my urine and what if it is not present all the time? A Yes. The stent will cause some irritation to tissue, it may be present while you have the stent and after you remove the stent. The  blood may be a light red or darker at times. Don't be alarmed if the blood resolves and then returns. This is typical for most patients

## 2023-01-04 NOTE — Progress Notes (Signed)
   [facility] Surgical Posting Form  Surgery Date: Date: 01/09/2023  Surgeon: Dr. Irineo Axon, MD  Inpt ( No  )   Outpt (Yes)   Obs ( No  )   Diagnosis: N20.0 Bilateral Nephrolithiasis   -CPT: 16109, [phone]  Surgery: Left Ureteroscopy with Left Ureteral Stent Removal, Right Ureteroscopy with Laser Lithotripsy and Stent Placement   Stop Anticoagulations: N/A  Cardiac/Medical/Pulmonary Clearance needed: no  *Orders entered into EPIC  Date: 01/04/23   *Case booked in EPIC  Date: 01/04/23  *Notified pt of Surgery: Date: 01/04/23  PRE-OP UA & CX: no  *Placed into Prior Authorization Work [place] Date: 01/04/23  Assistant/laser/rep:No

## 2023-01-04 NOTE — Telephone Encounter (Signed)
I spoke with Pamela Booker. We have discussed possible surgery dates and Tuesday August 6th, 2024 was agreed upon by all parties. Patient given information about surgery date, what to expect pre-operatively and post operatively.  We discussed that a Pre-Admission Testing office will be calling to set up the pre-op visit that will take place prior to surgery, and that these appointments are typically done over the phone with a Pre-Admissions RN. Informed patient that our office will communicate any additional care to be provided after surgery. Patients questions or concerns were discussed during our call. Advised to call our office should there be any additional information, questions or concerns that arise. Patient verbalized understanding.

## 2023-01-08 ENCOUNTER — Other Ambulatory Visit: Payer: Self-pay

## 2023-01-08 ENCOUNTER — Encounter
Admission: RE | Admit: 2023-01-08 | Discharge: 2023-01-08 | Disposition: A | Discharge status: Home / Self Care | Payer: 59 | Source: Ambulatory Visit | Attending: Urology | Admitting: Urology

## 2023-01-08 DIAGNOSIS — N200 Calculus of kidney: Secondary | ICD-10-CM

## 2023-01-08 DIAGNOSIS — Z01812 Encounter for preprocedural laboratory examination: Secondary | ICD-10-CM

## 2023-01-08 HISTORY — DX: Presence of other specified functional implants: Z96.89

## 2023-01-08 HISTORY — DX: Calculus of kidney: N20.0

## 2023-01-08 NOTE — Patient Instructions (Addendum)
Your procedure is scheduled on: 01/09/2023 Tuesday. Report to the Registration Desk on the 1st floor of the [facility]. To find out your arrival time, please call [phone] between 1PM - 3PM on: 01/08/2023 If your arrival time is 6:00 am, do not arrive before that time as the Medical Mall entrance doors do not open until 6:00 am.  REMEMBER: Instructions that are not followed completely may result in serious medical risk, up to and including death; or upon the discretion of your surgeon and anesthesiologist your surgery may need to be rescheduled.  Do not eat food after midnight the night before surgery.  No gum chewing or hard candies.  One week prior to surgery: Stop Anti-inflammatories (NSAIDS) such as Advil, Aleve, Ibuprofen, Motrin, Naproxen, Naprosyn and Aspirin based products such as Excedrin, Goody's Powder, BC Powder. Stop ANY OVER THE COUNTER supplements until after surgery. You may however, continue to take Tylenol if needed for pain up until the day of surgery.  Continue taking all prescribed medications .   Follow recommendations from Cardiologist or PCP regarding stopping blood thinners.  TAKE ONLY THESE MEDICATIONS THE MORNING OF SURGERY WITH A SIP OF WATER:  Pepcid (take one the night before and one on the morning of surgery - helps to prevent nausea after surgery.)  oxybutynin (DITROPAN)  tamsulosin (FLOMAX)  topiramate (TOPAMAX)     No Alcohol for 24 hours before or after surgery.  No Smoking including e-cigarettes for 24 hours before surgery.  No chewable tobacco products for at least 6 hours before surgery.  No nicotine patches on the day of surgery.  Do not use any "recreational" drugs for at least a week (preferably 2 weeks) before your surgery.  Please be advised that the combination of cocaine and anesthesia may have negative outcomes, up to and including death. If you test positive for cocaine, your surgery will be cancelled.  On the morning of  surgery brush your teeth with toothpaste and water, you may rinse your mouth with mouthwash if you wish. Do not swallow any toothpaste or mouthwash.   You need to shower on day surgery.  Do not wear jewelry, make-up, hairpins, clips or nail polish.  Do not wear lotions, powders, or perfumes.   Do not shave body hair from the neck down 48 hours before surgery.  Contact lenses, hearing aids and dentures may not be worn into surgery.  Do not bring valuables to the hospital. [facility] is not responsible for any missing/lost belongings or valuables.    Notify your doctor if there is any change in your medical condition (cold, fever, infection).  Wear comfortable clothing (specific to your surgery type) to the hospital.  After surgery, you can help prevent lung complications by doing breathing exercises.  Take deep breaths and cough every 1-2 hours. Your doctor may order a device called an Incentive Spirometer to help you take deep breaths.  If you are being admitted to the hospital overnight, leave your suitcase in the car. After surgery it may be brought to your room.  In case of increased patient census, it may be necessary for you, the patient, to continue your postoperative care in the Same Day Surgery department.  If you are being discharged the day of surgery, you will not be allowed to drive home. You will need a responsible individual to drive you home and stay with you for 24 hours after surgery.    Please call the Pre-admissions Testing Dept. at [phone] if you  have any questions about these instructions.  Surgery Visitation Policy:  Patients having surgery or a procedure may have two visitors.  Children under the age of 48 must have an adult with them who is not the patient.

## 2023-01-09 ENCOUNTER — Other Ambulatory Visit: Payer: Self-pay | Admitting: *Deleted

## 2023-01-09 ENCOUNTER — Other Ambulatory Visit: Payer: Self-pay | Admitting: Urology

## 2023-01-09 ENCOUNTER — Encounter
Admission: RE | Disposition: A | Discharge status: Home / Self Care | Payer: Self-pay | Source: Home / Self Care | Attending: Urology

## 2023-01-09 ENCOUNTER — Ambulatory Visit: Payer: 59 | Admitting: Anesthesiology

## 2023-01-09 ENCOUNTER — Ambulatory Visit: Payer: 59

## 2023-01-09 ENCOUNTER — Encounter: Payer: Self-pay | Admitting: Urology

## 2023-01-09 ENCOUNTER — Other Ambulatory Visit: Payer: Self-pay

## 2023-01-09 ENCOUNTER — Ambulatory Visit
Admission: RE | Admit: 2023-01-09 | Discharge: 2023-01-09 | Disposition: A | Discharge status: Home / Self Care | Payer: 59 | Attending: Urology | Admitting: Urology

## 2023-01-09 DIAGNOSIS — I10 Essential (primary) hypertension: Secondary | ICD-10-CM | POA: Diagnosis not present

## 2023-01-09 DIAGNOSIS — F419 Anxiety disorder, unspecified: Secondary | ICD-10-CM | POA: Insufficient documentation

## 2023-01-09 DIAGNOSIS — Z79899 Other long term (current) drug therapy: Secondary | ICD-10-CM | POA: Insufficient documentation

## 2023-01-09 DIAGNOSIS — N200 Calculus of kidney: Secondary | ICD-10-CM | POA: Diagnosis not present

## 2023-01-09 DIAGNOSIS — G43909 Migraine, unspecified, not intractable, without status migrainosus: Secondary | ICD-10-CM | POA: Insufficient documentation

## 2023-01-09 DIAGNOSIS — F32A Depression, unspecified: Secondary | ICD-10-CM | POA: Diagnosis not present

## 2023-01-09 DIAGNOSIS — Z01812 Encounter for preprocedural laboratory examination: Secondary | ICD-10-CM

## 2023-01-09 DIAGNOSIS — G40909 Epilepsy, unspecified, not intractable, without status epilepticus: Secondary | ICD-10-CM | POA: Diagnosis not present

## 2023-01-09 DIAGNOSIS — Z9682 Presence of neurostimulator: Secondary | ICD-10-CM | POA: Insufficient documentation

## 2023-01-09 HISTORY — PX: CYSTOSCOPY W/ URETERAL STENT REMOVAL: SHX1430

## 2023-01-09 HISTORY — PX: URETEROSCOPY: SHX842

## 2023-01-09 HISTORY — PX: CYSTOSCOPY/URETEROSCOPY/HOLMIUM LASER/STENT PLACEMENT: SHX6546

## 2023-01-09 LAB — POCT PREGNANCY, URINE: Preg Test, Ur: NEGATIVE

## 2023-01-09 SURGERY — URETEROSCOPY
Anesthesia: General | Site: Urethra | Laterality: Right

## 2023-01-09 MED ORDER — ONDANSETRON HCL 4 MG/2ML IJ SOLN
INTRAMUSCULAR | Status: DC | PRN
  Administered 2023-01-09: 4 mg via INTRAVENOUS

## 2023-01-09 MED ORDER — MIDAZOLAM HCL 2 MG/2ML IJ SOLN
INTRAMUSCULAR | Status: DC | PRN
  Administered 2023-01-09: 2 mg via INTRAVENOUS

## 2023-01-09 MED ORDER — LACTATED RINGERS IV SOLN: INTRAVENOUS | Status: DC

## 2023-01-09 MED ORDER — CHLORHEXIDINE GLUCONATE 0.12 % MT SOLN
15.0000 mL | Freq: Once | OROMUCOSAL | Status: AC
  Administered 2023-01-09: 15 mL via OROMUCOSAL

## 2023-01-09 MED ORDER — IOHEXOL 180 MG/ML  SOLN
INTRAMUSCULAR | Status: DC | PRN
  Administered 2023-01-09: 20 mL

## 2023-01-09 MED ORDER — OXYBUTYNIN CHLORIDE 5 MG PO TABS
ORAL_TABLET | ORAL | Status: AC
  Filled 2023-01-09: qty 1

## 2023-01-09 MED ORDER — FENTANYL CITRATE (PF) 100 MCG/2ML IJ SOLN
INTRAMUSCULAR | Status: DC | PRN
  Administered 2023-01-09 (×2): 50 ug via INTRAVENOUS

## 2023-01-09 MED ORDER — DEXMEDETOMIDINE HCL IN NACL 80 MCG/20ML IV SOLN
INTRAVENOUS | Status: DC | PRN
  Administered 2023-01-09: 12 ug via INTRAVENOUS

## 2023-01-09 MED ORDER — ROCURONIUM BROMIDE 100 MG/10ML IV SOLN
INTRAVENOUS | Status: DC | PRN
  Administered 2023-01-09: 8 mg via INTRAVENOUS
  Administered 2023-01-09: 35 mg via INTRAVENOUS

## 2023-01-09 MED ORDER — FENTANYL CITRATE (PF) 100 MCG/2ML IJ SOLN
INTRAMUSCULAR | Status: AC
  Filled 2023-01-09: qty 2

## 2023-01-09 MED ORDER — ORAL CARE MOUTH RINSE: 15.0000 mL | Freq: Once | OROMUCOSAL | Status: AC

## 2023-01-09 MED ORDER — MIDAZOLAM HCL 2 MG/2ML IJ SOLN
INTRAMUSCULAR | Status: AC
  Filled 2023-01-09: qty 2

## 2023-01-09 MED ORDER — HYDROCODONE-ACETAMINOPHEN 5-325 MG PO TABS
1.0000 | ORAL_TABLET | Freq: Four times a day (QID) | ORAL | 0 refills | Status: DC | PRN
Start: 2023-01-09 — End: 2023-01-09

## 2023-01-09 MED ORDER — DEXAMETHASONE SODIUM PHOSPHATE 10 MG/ML IJ SOLN
INTRAMUSCULAR | Status: AC
  Filled 2023-01-09: qty 1

## 2023-01-09 MED ORDER — SODIUM CHLORIDE 0.9 % IV SOLN
1.0000 g | INTRAVENOUS | Status: AC
  Administered 2023-01-09: 1 g via INTRAVENOUS
  Filled 2023-01-09 (×2): qty 10

## 2023-01-09 MED ORDER — ACETAMINOPHEN 500 MG PO TABS
ORAL_TABLET | ORAL | Status: AC
  Filled 2023-01-09: qty 2

## 2023-01-09 MED ORDER — ACETAMINOPHEN 10 MG/ML IV SOLN: 1000.0000 mg | Freq: Once | INTRAVENOUS | Status: DC | PRN

## 2023-01-09 MED ORDER — HYDROCODONE-ACETAMINOPHEN 5-325 MG PO TABS: 1.0000 | ORAL_TABLET | Freq: Four times a day (QID) | ORAL | 0 refills | Status: DC | PRN

## 2023-01-09 MED ORDER — DROPERIDOL 2.5 MG/ML IJ SOLN: 0.6250 mg | Freq: Once | INTRAMUSCULAR | Status: DC | PRN

## 2023-01-09 MED ORDER — ROCURONIUM BROMIDE 10 MG/ML (PF) SYRINGE
PREFILLED_SYRINGE | INTRAVENOUS | Status: AC
  Filled 2023-01-09: qty 10

## 2023-01-09 MED ORDER — DEXAMETHASONE SODIUM PHOSPHATE 10 MG/ML IJ SOLN
INTRAMUSCULAR | Status: DC | PRN
  Administered 2023-01-09: 10 mg via INTRAVENOUS

## 2023-01-09 MED ORDER — LIDOCAINE HCL (CARDIAC) PF 100 MG/5ML IV SOSY
PREFILLED_SYRINGE | INTRAVENOUS | Status: DC | PRN
  Administered 2023-01-09: 50 mg via INTRAVENOUS

## 2023-01-09 MED ORDER — CHLORHEXIDINE GLUCONATE 0.12 % MT SOLN
OROMUCOSAL | Status: AC
  Filled 2023-01-09: qty 15

## 2023-01-09 MED ORDER — OXYCODONE HCL 5 MG PO TABS: 5.0000 mg | ORAL_TABLET | Freq: Once | ORAL | Status: DC | PRN

## 2023-01-09 MED ORDER — PROPOFOL 10 MG/ML IV BOLUS
INTRAVENOUS | Status: DC | PRN
  Administered 2023-01-09: 200 mg via INTRAVENOUS

## 2023-01-09 MED ORDER — ONDANSETRON HCL 4 MG/2ML IJ SOLN
INTRAMUSCULAR | Status: AC
  Filled 2023-01-09: qty 2

## 2023-01-09 MED ORDER — SODIUM CHLORIDE 0.9 % IR SOLN
Status: DC | PRN
  Administered 2023-01-09: 3000 mL

## 2023-01-09 MED ORDER — SUGAMMADEX SODIUM 200 MG/2ML IV SOLN
INTRAVENOUS | Status: DC | PRN
  Administered 2023-01-09: 200 mg via INTRAVENOUS

## 2023-01-09 MED ORDER — FENTANYL CITRATE (PF) 100 MCG/2ML IJ SOLN: 25.0000 ug | INTRAMUSCULAR | Status: DC | PRN

## 2023-01-09 MED ORDER — SUCCINYLCHOLINE CHLORIDE 200 MG/10ML IV SOSY
PREFILLED_SYRINGE | INTRAVENOUS | Status: DC | PRN
  Administered 2023-01-09: 80 mg via INTRAVENOUS

## 2023-01-09 MED ORDER — OXYCODONE HCL 5 MG/5ML PO SOLN: 5.0000 mg | Freq: Once | ORAL | Status: DC | PRN

## 2023-01-09 MED ORDER — ACETAMINOPHEN 500 MG PO TABS
1000.0000 mg | ORAL_TABLET | Freq: Once | ORAL | Status: AC
  Administered 2023-01-09: 1000 mg via ORAL

## 2023-01-09 MED ORDER — OXYBUTYNIN CHLORIDE 5 MG PO TABS
5.0000 mg | ORAL_TABLET | Freq: Once | ORAL | Status: AC
  Administered 2023-01-09: 5 mg via ORAL

## 2023-01-09 MED ORDER — OXYBUTYNIN CHLORIDE 5 MG PO TABS: ORAL_TABLET | ORAL | 0 refills | Status: DC

## 2023-01-09 SURGICAL SUPPLY — 31 items
BAG DRAIN SIEMENS DORNER NS (MISCELLANEOUS) ×2 IMPLANT
BAG DRN NS LF (MISCELLANEOUS) ×2
BAG PRESSURE INF REUSE 3000 (BAG) ×2 IMPLANT
BASKET ZERO TIP 1.9FR (BASKET) IMPLANT
BRUSH SCRUB EZ 1% IODOPHOR (MISCELLANEOUS) ×2 IMPLANT
BSKT STON RTRVL ZERO TP 1.9FR (BASKET) ×2
CATH URET FLEX-TIP 2 LUMEN 10F (CATHETERS) ×2 IMPLANT
CATH URETL OPEN 5X70 (CATHETERS) ×2 IMPLANT
CATH URETL OPEN END 6X70 (CATHETERS) IMPLANT
CNTNR URN SCR LID CUP LEK RST (MISCELLANEOUS) ×2 IMPLANT
CONT SPEC 4OZ STRL OR WHT (MISCELLANEOUS) ×2
DRAPE UTILITY 15X26 TOWEL STRL (DRAPES) ×2 IMPLANT
FIBER LASER MOSES 200 DFL (Laser) IMPLANT
GLOVE BIOGEL PI IND STRL 7.5 (GLOVE) ×2 IMPLANT
GOWN STRL REUS W/ TWL LRG LVL3 (GOWN DISPOSABLE) ×4 IMPLANT
GOWN STRL REUS W/ TWL XL LVL3 (GOWN DISPOSABLE) ×2 IMPLANT
GOWN STRL REUS W/TWL LRG LVL3 (GOWN DISPOSABLE) ×4
GOWN STRL REUS W/TWL XL LVL3 (GOWN DISPOSABLE) ×2
GUIDEWIRE GREEN .038 145CM (MISCELLANEOUS) ×2 IMPLANT
GUIDEWIRE STR DUAL SENSOR (WIRE) ×2 IMPLANT
IV NS IRRIG 3000ML ARTHROMATIC (IV SOLUTION) ×2 IMPLANT
KIT TURNOVER CYSTO (KITS) ×2 IMPLANT
PACK CYSTO AR (MISCELLANEOUS) ×2 IMPLANT
SET CYSTO W/LG BORE CLAMP LF (SET/KITS/TRAYS/PACK) ×2 IMPLANT
SHEATH NAVIGATOR HD 12/14X36 (SHEATH) ×2 IMPLANT
STENT URET 6FRX22 CONTOUR (STENTS) IMPLANT
STENT URET 6FRX24 CONTOUR (STENTS) IMPLANT
STENT URET 6FRX26 CONTOUR (STENTS) IMPLANT
SURGILUBE 2OZ TUBE FLIPTOP (MISCELLANEOUS) ×2 IMPLANT
VALVE UROSEAL ADJ ENDO (VALVE) IMPLANT
WATER STERILE IRR 500ML POUR (IV SOLUTION) ×2 IMPLANT

## 2023-01-09 NOTE — Interval H&P Note (Signed)
History and Physical Interval Note:  01/09/2023 1:08 PM  Pamela Booker  has presented today for surgery, with the diagnosis of Bilateral Nephrolithiasis.  The various methods of treatment have been discussed with the patient and family. After consideration of risks, benefits and other options for treatment, the patient has consented to  Procedure(s): URETEROSCOPY (Left) CYSTOSCOPY WITH STENT REMOVAL (Left) CYSTOSCOPY/URETEROSCOPY/HOLMIUM LASER/STENT PLACEMENT (Right) as a surgical intervention.  The patient's history has been reviewed, patient examined, no change in status, stable for surgery.  I have reviewed the patient's chart and labs.  Questions were answered to the patient's satisfaction.    Status post left ureteroscopy/laser lithotripsy with stent placement 1 week ago and presents today for removal of left ureteral stent with second look ureteroscopy and right ureteroscopy/laser lithotripsy with stent placement.  CV: RRR Lungs: Clear   C 

## 2023-01-09 NOTE — Transfer of Care (Signed)
Immediate Anesthesia Transfer of Care Note  Patient: Pamela Booker  Procedure(s) Performed: URETEROSCOPY (Left: Urethra) CYSTOSCOPY WITH STENT REMOVAL (Left: Urethra) CYSTOSCOPY/URETEROSCOPY/HOLMIUM LASER/STENT PLACEMENT (Right: Urethra)  Patient Location: PACU  Anesthesia Type:General  Level of Consciousness: drowsy, patient cooperative, and responds to stimulation  Airway & Oxygen Therapy: Patient Spontanous Breathing and Patient connected to nasal cannula oxygen  Post-op Assessment: Report given to RN and Post -op Vital signs reviewed and stable  Post vital signs: Reviewed and stable  Last Vitals:  Vitals Value Taken Time  BP 118/61 01/09/23 1450  Temp    Pulse 79 01/09/23 1452  Resp 14 01/09/23 1452  SpO2 97 % 01/09/23 1452  Vitals shown include unfiled device data.  Last Pain:  Vitals:   01/09/23 1228  TempSrc: Temporal  PainSc: 0-No pain         Complications: No notable events documented.

## 2023-01-09 NOTE — Anesthesia Procedure Notes (Signed)
Procedure Name: Intubation Date/Time: 01/09/2023 1:46 PM  Performed by: Jeannene Patella, CRNAPre-anesthesia Checklist: Patient identified, Emergency Drugs available, Suction available, Patient being monitored and Timeout performed Patient Re-evaluated:Patient Re-evaluated prior to induction Oxygen Delivery Method: Circle system utilized Preoxygenation: Pre-oxygenation with 100% oxygen Induction Type: IV induction, Rapid sequence and Cricoid Pressure applied Laryngoscope Size: McGraph and 4 Grade View: Grade II Tube type: Oral Tube size: 6.0 mm Number of attempts: 1 Airway Equipment and Method: Stylet and Video-laryngoscopy Placement Confirmation: ETT inserted through vocal cords under direct vision, positive ETCO2 and breath sounds checked- equal and bilateral Secured at: 20 cm Tube secured with: Tape Dental Injury: Teeth and Oropharynx as per pre-operative assessment

## 2023-01-09 NOTE — Anesthesia Preprocedure Evaluation (Addendum)
Anesthesia Evaluation  Patient identified by MRN, date of birth, ID band Patient awake    Reviewed: Allergy & Precautions, H&P , NPO status , Patient's Chart, lab work & pertinent test results  Airway Mallampati: II  TM Distance: >3 FB Neck ROM: full    Dental   Pulmonary neg pulmonary ROS   Pulmonary exam normal        Cardiovascular hypertension, Pt. on medications and Pt. on home beta blockers Normal cardiovascular exam+ dysrhythmias      Neuro/Psych Seizures -, Poorly Controlled,  PSYCHIATRIC DISORDERS  Depression    Last seizure was (app.12-18-22)    GI/Hepatic Neg liver ROS,GERD  Controlled,,  Endo/Other  negative endocrine ROS    Renal/GU Renal diseaseBilateral nephrolithiasis     Musculoskeletal   Abdominal Normal abdominal exam  (+)   Peds  Hematology negative hematology ROS (+)   Anesthesia Other Findings Status post placement of VNS (vagus nerve stimulation) device  Past Medical History: No date: Anxiety No date: Bilateral nephrolithiasis No date: Depression No date: Endometriosis No date: Epilepsy (HCC) No date: GERD (gastroesophageal reflux disease) No date: History of kidney stones 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 No date: Hypertension No date: Migraines No date: Ovarian cyst No date: Palpitations No date: PVC (premature ventricular contraction) No date: Seizures (HCC)     Comment:  last seizure was 2 weeks ago (app.12-18-22) No date: Status post placement of VNS (vagus nerve stimulation) device 06/10/2019: Syncope and collapse No date: Thyroid nodule  Past Surgical History: 01/02/2023: CYSTOSCOPY/URETEROSCOPY/HOLMIUM LASER/STENT PLACEMENT; Left     Comment:  Procedure: CYSTOSCOPY/URETEROSCOPY/HOLMIUM LASER/STENT               PLACEMENT;  Surgeon: Riki Altes, MD;  Location:               [facility] ORS;  Service: Urology;  Laterality: Left; 06/2022: ENDOMETRIAL ABLATION     Comment:  [facility] No  date: LAPAROSCOPY     Comment:  x5 for endometriosis 2020: LOOP RECORDER INSERTION 2021: LOOP RECORDER REMOVAL No date: TONSILLECTOMY 2022: VAGUS NERVE STIMULATOR INSERTION     Reproductive/Obstetrics negative OB ROS                             Anesthesia Physical Anesthesia Plan  ASA: 2  Anesthesia Plan: General ETT   Post-op Pain Management: Tylenol PO (pre-op)*   Induction: Intravenous  PONV Risk Score and Plan: 2 and Ondansetron, Dexamethasone and Midazolam  Airway Management Planned: Oral ETT  Additional Equipment:   Intra-op Plan:   Post-operative Plan: Extubation in OR  Informed Consent: I have reviewed the patients History and Physical, chart, labs and discussed the procedure including the risks, benefits and alternatives for the proposed anesthesia with the patient or authorized representative who has indicated his/her understanding and acceptance.     Dental Advisory Given  Plan Discussed with: CRNA and Surgeon  Anesthesia Plan Comments:         Anesthesia Quick Evaluation

## 2023-01-09 NOTE — Discharge Instructions (Addendum)
DISCHARGE INSTRUCTIONS FOR KIDNEY STONE/URETERAL STENT   MEDICATIONS:  1. Resume all your other meds from home.  2.  AZO (over-the-counter) can help with the burning/stinging when you urinate. 3. Hydrocodone-acetaminophen is for moderate/severe pain, Rx was sent to your pharmacy.  ACTIVITY:  1. May resume regular activities in 24 hours. 2. No driving while on narcotic pain medications  3. Drink plenty of water  4. Continue to walk at home - you can still get blood clots when you are at home, so keep active, but don't over do it.  5. May return to work/school tomorrow or when you feel ready   BATHING:  1. You can shower. 2. You have a string coming from your urethra: The stent string is attached to your ureteral stent. Do not pull on this.   SIGNS/SYMPTOMS TO CALL:  Common postoperative symptoms include urinary frequency, urgency, bladder spasm and blood in the urine  Please call us if you have a fever greater than 101.5, uncontrolled nausea/vomiting, uncontrolled pain, dizziness, unable to urinate, excessively bloody urine, chest pain, shortness of breath, leg swelling, leg pain, or any other concerns or questions.   You can reach [place] at [phone].   FOLLOW-UP:  1. You will be contacted for a follow-up appointment 2. You have a string attached to your stent, you may remove it on Thursday, 01/11/2023. To do this, pull the string until the stent is completely removed. You may feel an odd sensation in your back.  AMBULATORY SURGERY  DISCHARGE INSTRUCTIONS   The drugs that you were given will stay in your system until tomorrow so for the next 24 hours you should not:  Drive an automobile Make any legal decisions Drink any alcoholic beverage   You may resume regular meals tomorrow.  Today it is better to start with liquids and gradually work up to solid foods.  You may eat anything you prefer, but it is better to start with liquids, then soup and crackers, and gradually work up  to solid foods.   Please notify your doctor immediately if you have any unusual bleeding, trouble breathing, redness and pain at the surgery site, drainage, fever, or pain not relieved by medication.    Additional Instructions:     Please contact your physician with any problems or Same Day Surgery at [phone], Monday through Friday 6 am to 4 pm, or [place] at [facility], [facility], [facility] number at [phone].

## 2023-01-09 NOTE — Op Note (Signed)
Preoperative diagnosis:  Right nephrolithiasis  Status post left ureteroscopy with stone removal  Postoperative diagnosis:  Same  Procedure:  Cystoscopy Right ureteroscopy and stone removal Right ureteroscopic laser lithotripsy Right ureteral stent placement (8F/22 cm) Right retrograde pyelography with interpretation Left ureteral stent removal Second look diagnostic left ureteroscopy  Surgeon: Lorin Picket C. , M.D.  Anesthesia: General  Complications: None  Intraoperative findings:  Cystoscopy: Mucosa normal in appearance with mild erythema, solid or papillary lesions with the exception of inflammatory changes left hemitrigone secondary to indwelling stent Right ureteropyeloscopy: Ureteral mucosa normal in appearance without lesions, calculi or stricture.  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  Upper pole calyx had a narrowed opening but no definite calculus was identified.  3 mm calculi x 2 were identified in mid and lower pole calyces.  On CT there was an elongated calculus in a lower pole calyx which was not identified on ureteroscopy..  An inferior pole calyx could not be fully evaluated secondary to limitation of ureteroscope deflection Left ureteropyeloscopy: A few minute stone particles noted within the ureter.  All calyces were examined and a few minute insignificant stone particles were identified Right retrograde pyelography post procedure showed no filling defects, stone fragments or contrast extravasation  EBL: Minimal  Specimens: Calculus fragment for analysis   Indication: Pamela Booker is a 27 y.o. female with bilateral nephrolithiasis.  She is status post left ureteroscopy with laser lithotripsy of multiple left calyceal calculi 1 week ago and presents today for right sided laser lithotripsy/stone removal.  She will also undergo removal of her left renal stent and a second look ureteroscopy.  After reviewing the management options for treatment, the  patient elected to proceed with the above surgical procedure(s). We have discussed the potential benefits and risks of the procedure, side effects of the proposed treatment, the likelihood of the patient achieving the goals of the procedure, and any potential problems that might occur during the procedure or recuperation. Informed consent has been obtained.  Description of procedure:  The patient was taken to the operating room and general anesthesia was induced.  The patient was placed in the dorsal lithotomy position, prepped and draped in the usual sterile fashion, and preoperative antibiotics were administered. A preoperative time-out was performed.   A 21 French cystoscope was lubricated and passed per urethra.  Panendoscopy was performed with findings as described above.  Attention was directed to the right UO and a 0.038 Sensor wire was placed through the cystoscope and into the right ureteral orifice and advanced into the renal pelvis under fluoroscopic guidance.  Attention was then directed to the left ureteral orifice and the stent was grasped with endoscopic forceps and brought out through the urethral meatus.  A 0.038 Sensor wire was placed through the stent followed by stent removal.  A single channel digital flexible ureteroscope was then easily inserted into the ureter alongside the guidewire and advanced under direct vision into the renal pelvis with findings as described above.  The ureteroscope and guidewire were then removed.  The digital ureteroscope was then easily inserted into the right ureter alongside the guidewire and advanced into the renal pelvis under direct vision with findings as described above.  The 2 calculi were were then dusted with a 200 m holmium laser fiber at a setting of 0.3J/40 Hz.  Retrograde pyelogram was then performed through the ureteroscope with findings as described above.  All calyces were examined under fluoroscopic guidance and only 1 fragment  estimated at 2  mm in size was identified which was placed in a basket, removed and sent for analysis.  A 44F/23 cm Contour ureteral stent was placed under fluoroscopic guidance.  The wire was then removed with the proximal tip noted in an upper pole calyx and good curl noted in the bladder.   The bladder was then emptied and the procedure ended.  The patient appeared to tolerate the procedure well and without complications.  After anesthetic reversal the patient was transported to the PACU in stable condition.   Plan: Her stent was left on the tether and she was instructed to remove on Thursday, 01/11/2023.  Irineo Axon, MD

## 2023-01-10 ENCOUNTER — Encounter: Payer: Self-pay | Admitting: Urology

## 2023-01-11 NOTE — Anesthesia Postprocedure Evaluation (Signed)
Anesthesia Post Note  Patient: Rheda Wyma  Procedure(s) Performed: URETEROSCOPY (Left: Urethra) CYSTOSCOPY WITH STENT REMOVAL (Left: Urethra) CYSTOSCOPY/URETEROSCOPY/HOLMIUM LASER/STENT PLACEMENT (Right: Urethra)  Patient location during evaluation: PACU Anesthesia Type: General Level of consciousness: awake and alert Pain management: pain level controlled Vital Signs Assessment: post-procedure vital signs reviewed and stable Respiratory status: spontaneous breathing, nonlabored ventilation and respiratory function stable Cardiovascular status: blood pressure returned to baseline and stable Postop Assessment: no apparent nausea or vomiting Anesthetic complications: no   No notable events documented.   Last Vitals:  Vitals:   01/09/23 1615 01/09/23 1635  BP: 139/85 136/88  Pulse: 60 85  Resp:  16  Temp:  (!) 36.2 C  SpO2: 96% 97%    Last Pain:  Vitals:   01/10/23 0900  TempSrc:   PainSc: 0-No pain                 Foye Deer

## 2023-01-24 ENCOUNTER — Other Ambulatory Visit: Payer: Self-pay | Admitting: Physician Assistant

## 2023-01-24 DIAGNOSIS — N200 Calculus of kidney: Secondary | ICD-10-CM

## 2023-01-29 ENCOUNTER — Ambulatory Visit
Admission: EM | Admit: 2023-01-29 | Discharge: 2023-01-29 | Disposition: A | Discharge status: Home / Self Care | Payer: 59 | Attending: Internal Medicine | Admitting: Internal Medicine

## 2023-01-29 ENCOUNTER — Telehealth: Payer: Self-pay

## 2023-01-29 DIAGNOSIS — M545 Low back pain, unspecified: Secondary | ICD-10-CM | POA: Insufficient documentation

## 2023-01-29 LAB — URINALYSIS, W/ REFLEX TO CULTURE (INFECTION SUSPECTED)
Bilirubin Urine: NEGATIVE
Glucose, UA: 250 mg/dL — AB
Hgb urine dipstick: NEGATIVE
Ketones, ur: NEGATIVE mg/dL
Leukocytes,Ua: NEGATIVE
Nitrite: NEGATIVE
Protein, ur: NEGATIVE mg/dL
RBC / HPF: NONE SEEN RBC/hpf (ref 0–5)
Specific Gravity, Urine: 1.015 (ref 1.005–1.030)
WBC, UA: NONE SEEN WBC/hpf (ref 0–5)
pH: 7 (ref 5.0–8.0)

## 2023-01-29 LAB — PREGNANCY, URINE: Preg Test, Ur: NEGATIVE

## 2023-01-29 NOTE — ED Provider Notes (Signed)
[facility]    CSN: [id number] Arrival date & time: 01/29/23  1711      History   Chief Complaint Chief Complaint  Patient presents with   Dysuria    HPI Pamela Booker is a 27 y.o. female presents for back pain.  Patient reports 1 day of a worsening left lower back pain that is persistent and does radiate down into the buttock.  She denies any numbness/tingling/weakness of her lower extremities, no bowel or bladder incontinence, no saddle paresthesia.  No dysuria or hematuria.  She does have a significant history of kidney stones and recently had a ureteroscopy on August 6 where she had kidney stones removed from the left.  She does have an appointment with her urologist in 3 days.  She states the pain does not feel consistent when she has had kidney stones in the past.  She has been taking Tylenol and using heat without improvement.  She is actively trying to conceive and states that she normally has negative pregnancy test until she misses a period which is due in the next 5 days.  Reports a negative home pregnancy test.  No other concerns at this time.   Dysuria   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   Major depressive disorder 07/05/2021   Breakthrough seizure (HCC) 07/05/2021   Epilepsy (HCC) 07/05/2021   Migraine headache 07/05/2021   Essential hypertension 07/05/2021   Seizure (HCC)  07/05/2021   Irregular intermenstrual bleeding 06/13/2018   Vaginal dryness 06/06/2018   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   PVC (premature ventricular contraction) 03/11/2018   Endometriosis 03/14/2017   Atypical chest pain 04/25/2016   Palpitations 04/25/2016   Shortness of breath 04/25/2016   Chronic migraine without aura without status migrainosus, not intractable 12/11/2014    Past Surgical History:  Procedure Laterality Date   CYSTOSCOPY W/ URETERAL STENT REMOVAL Left 01/09/2023   Procedure: CYSTOSCOPY WITH STENT REMOVAL;  Surgeon: Riki Altes, MD;  Location: [facility];  Service: Urology;  Laterality: Left;   CYSTOSCOPY/URETEROSCOPY/HOLMIUM LASER/STENT PLACEMENT Left 01/02/2023   Procedure: CYSTOSCOPY/URETEROSCOPY/HOLMIUM LASER/STENT PLACEMENT;  Surgeon: Riki Altes, MD;  Location: [facility];  Service: Urology;  Laterality: Left;   CYSTOSCOPY/URETEROSCOPY/HOLMIUM LASER/STENT PLACEMENT Right 01/09/2023   Procedure: CYSTOSCOPY/URETEROSCOPY/HOLMIUM LASER/STENT PLACEMENT;  Surgeon: Riki Altes, MD;  Location: [facility];  Service: Urology;  Laterality: Right;   ENDOMETRIAL ABLATION  06/2022   [facility]   LAPAROSCOPY     x5 for endometriosis   LOOP RECORDER INSERTION  2020   LOOP RECORDER REMOVAL  2021   TONSILLECTOMY     URETEROSCOPY Left 01/09/2023   Procedure: URETEROSCOPY;  Surgeon: Riki Altes, MD;  Location: [facility];  Service: Urology;  Laterality: Left;   VAGUS NERVE STIMULATOR INSERTION  2022  OB History     Gravida  1   Para  0   Term  0   Preterm  0   AB  0   Living         SAB  0   IAB  0   Ectopic  0   Multiple      Live Births               Home Medications    Prior to Admission medications   Medication Sig Start Date End Date Taking? Authorizing Provider  acebutolol (SECTRAL) 200 MG capsule TAKE ONE CAPSULE BY MOUTH TWICE A DAY DISCONTINUE VERAPAMIL Patient taking differently: Take 200 mg by mouth every morning. TAKE ONE CAPSULE BY  MOUTH TWICE A DAY DISCONTINUE VERAPAMIL 06/02/21  Yes Cantwell, Celeste C, PA-C  acetaminophen (TYLENOL) 325 MG tablet Take 650 mg by mouth every 6 (six) hours as needed.   Yes [provider]  diazepam (VALIUM) 5 MG tablet Take 5 mg by mouth 2 (two) times daily as needed for muscle spasms or anxiety (and seizures). 05/31/21  Yes [provider]  Famotidine (PEPCID PO) Take 1 tablet by mouth as needed.   Yes [provider]  HYDROcodone-acetaminophen (NORCO/VICODIN) 5-325 MG tablet Take 1 tablet by mouth every 6 (six) hours as needed for moderate pain. 01/09/23  Yes Stoioff, Verna Czech, MD  ondansetron (ZOFRAN-ODT) 4 MG disintegrating tablet Take 1 tablet (4 mg total) by mouth every 8 (eight) hours as needed for nausea or vomiting. 12/28/22  Yes Vaillancourt, Samantha, PA-C  oxcarbazepine (TRILEPTAL) 600 MG tablet Take 600 mg by mouth at bedtime. 12/14/22  Yes [provider]  oxybutynin (DITROPAN) 5 MG tablet 1 tab tid prn frequency,urgency, bladder spasm 01/09/23  Yes Stoioff, Scott C, MD  phentermine 37.5 MG capsule Take 37.5 mg by mouth every morning.   Yes [provider]  PHEXXI 1.8-1-0.4 % GEL Place 1 application  vaginally as needed.   Yes [provider]  rizatriptan (MAXALT-MLT) 10 MG disintegrating tablet Take 10 mg by mouth daily as needed for migraine.  03/17/19  Yes [provider]  tamsulosin (FLOMAX) 0.4 MG CAPS capsule TAKE 1 CAPSULE BY MOUTH EVERY DAY 01/24/23  Yes Vaillancourt, Samantha, PA-C  tiZANidine (ZANAFLEX) 4 MG tablet Take 4-8 mg by mouth as needed for muscle spasms. 06/08/21  Yes [provider]  topiramate (TOPAMAX) 100 MG tablet Take 100 mg by mouth every morning.   Yes [provider]  valACYclovir (VALTREX) 500 MG tablet Take 500 mg by mouth daily as needed.   Yes [provider]  XCOPRI 200 MG TABS Take 1 tablet by mouth at bedtime. 11/16/22 03/20/23 Yes [provider]    Family  History Family History  Adopted: Yes  Problem Relation Age of Onset   Healthy Daughter     Social History Social History   Tobacco Use   Smoking status: Never   Smokeless tobacco: Never  Vaping Use   Vaping status: Every Day   Substances: Nicotine  Substance Use Topics   Alcohol use: Yes    Comment: socially   Drug use: Yes    Comment: THC     Allergies   Nsaids, Ativan [lorazepam], Compazine [prochlorperazine], Diphenhydramine, Ibuprofen, Keppra [levetiracetam], Zolpidem, and Emgality [galcanezumab-gnlm]   Review of Systems Review of Systems  Musculoskeletal:  Positive for back pain.     Physical Exam Triage Vital Signs ED Triage Vitals  Encounter Vitals Group  BP 01/29/23 1827 127/85     Systolic BP Percentile --      Diastolic BP Percentile --      Pulse Rate 01/29/23 1827 62     Resp 01/29/23 1827 16     Temp 01/29/23 1827 98.7 F (37.1 C)     Temp Source 01/29/23 1827 Oral     SpO2 01/29/23 1827 100 %     Weight 01/29/23 1826 135 lb (61.2 kg)     Height 01/29/23 1826 5\' 2"  (1.575 m)     Head Circumference --      Peak Flow --      Pain Score 01/29/23 1828 7     Pain Loc --      Pain Education --      Exclude from Growth Chart --    No data found.  Updated Vital Signs BP 127/85 (BP Location: Left Arm)   Pulse 62   Temp 98.7 F (37.1 C) (Oral)   Resp 16   Ht 5\' 2"  (1.575 m)   Wt 135 lb (61.2 kg)   LMP 01/08/2023 (Exact Date)   SpO2 100%   BMI 24.69 kg/m   Visual Acuity Right Eye Distance:   Left Eye Distance:   Bilateral Distance:    Right Eye Near:   Left Eye Near:    Bilateral Near:     Physical Exam Vitals and nursing note reviewed.  Constitutional:      General: She is not in acute distress.    Appearance: Normal appearance. She is not ill-appearing.  HENT:     Head: Normocephalic and atraumatic.  Eyes:     Pupils: Pupils are equal, round, and reactive to light.  Cardiovascular:     Rate and Rhythm: Normal rate.   Pulmonary:     Effort: Pulmonary effort is normal.  Abdominal:     Tenderness: There is no right CVA tenderness or left CVA tenderness.  Musculoskeletal:     Lumbar back: Spasms and tenderness present. No swelling, edema, deformity, signs of trauma, lacerations or bony tenderness. Normal range of motion. Negative right straight leg raise test and negative left straight leg raise test. No scoliosis.       Back:  Skin:    General: Skin is warm and dry.  Neurological:     General: No focal deficit present.     Mental Status: She is alert and oriented to person, place, and time.  Psychiatric:        Mood and Affect: Mood normal.        Behavior: Behavior normal.      UC Treatments / Results  Labs (all labs ordered are listed, but only abnormal results are displayed) Labs Reviewed  URINALYSIS, W/ REFLEX TO CULTURE (INFECTION SUSPECTED) - Abnormal; Notable for the following components:      Result Value   Glucose, UA 250 (*)    Bacteria, UA RARE (*)    All other components within normal limits  PREGNANCY, URINE    EKG   Radiology No results found.  Procedures Procedures (including critical care time)  Medications Ordered in UC Medications - No data to display  Initial Impression / Assessment and Plan / UC Course  I have reviewed the triage vital signs and the nursing notes.  Pertinent labs & imaging results that were available during my care of the patient were reviewed by me and considered in my medical decision making (see chart for details).     Reviewed  exam and symptoms with patient.  UA negative for UTI and no blood in the urine.  hCG negative.  Discussed low suspicion for kidney stone given urine no sign of UTI/pyelonephritis.  Discussed likely musculoskeletal in nature.  As patient feels she may be pregnant even though she has had negative pregnancy test she does not want to take any medications that could be harmful to the baby.  Discussed that unable to do so  muscle relaxers or NSAIDs such as Toradol given this concern.  Advised that she should just continue Tylenol, heat and she can use a topical over-the-counter Voltaren as this is not systemically absorbed.  Follow-up with urologist as scheduled appointment this week and also advised PCP follow-up in 1 to 2 days as well.  Strict ER precautions reviewed and patient verbalized understanding. Final Clinical Impressions(s) / UC Diagnoses   Final diagnoses:  Acute left-sided low back pain, unspecified whether sciatica present     Discharge Instructions      Your urine was negative for UTI and there is no blood in the urine to indicate a kidney stone.  You can do topical Voltaren over-the-counter to help with the pain and continue the Tylenol.  Follow-up with urologist at your appointment this week and follow-up with your PCP in 1 to 2 days for recheck.  Please go to the emergency room for any worsening symptoms.  Hope you feel better soon!    ED Prescriptions   None    PDMP not reviewed this encounter.   Radford Pax, NP 01/29/23 [phone]

## 2023-01-29 NOTE — Discharge Instructions (Signed)
Your urine was negative for UTI and there is no blood in the urine to indicate a kidney stone.  You can do topical Voltaren over-the-counter to help with the pain and continue the Tylenol.  Follow-up with urologist at your appointment this week and follow-up with your PCP in 1 to 2 days for recheck.  Please go to the emergency room for any worsening symptoms.  Hope you feel better soon!

## 2023-01-29 NOTE — Telephone Encounter (Signed)
Keep scheduled appointment, plan to give UA on arrival. Call back or go to ED if severe pain, fevers, or nausea/vomiting.

## 2023-01-29 NOTE — Telephone Encounter (Signed)
Pt informed, voiced understanding

## 2023-01-29 NOTE — ED Triage Notes (Signed)
Pt c/o flank pain x1 day. States pain worsening throughout the day. Had recent surgery to remove kidney stone on 6/30 & 8/6. Has tried tylenol w/o relief.

## 2023-01-29 NOTE — Telephone Encounter (Signed)
Patient called stating that she is having left flank/back pain that has become more intense yesterday, she does have some discomfort on the right side but not as severe. She has been taking Tylenol and using heating pad. She is trying to work today with a heating pad but the discomfort is moderate still. No fever and blood in the urine. Has had more nocturia the last 2 days. NO burning with urination or pressure. NO vomiting but does have some nausea. She is not in severe pain. Patient does mention that she and her husband are trying to have a baby so she has stopped almost all of her medications including Tamsulosin. She is testing negative for pregnancy as of today still but then stated that she was testing negative with her other child also until she missed her period. Her last period was on 01/06/23.  Advised patient to take Tylenol 1000 mg every 8 hours, continue heating pad. Advised that I would send message to Dr Lonna Cobb and Sam-who she has an appointment to follow up on this on 02/01/23-first available. Advised patient if pain gets severe, fever or bleeding or vomiting develops to go to ER right away. Advised that we would call her back if we can see her sooner or if something needs to be done prior to her appointment.

## 2023-02-01 ENCOUNTER — Ambulatory Visit: Payer: 59 | Admitting: Physician Assistant

## 2023-02-15 ENCOUNTER — Telehealth (INDEPENDENT_AMBULATORY_CARE_PROVIDER_SITE_OTHER): Payer: 59 | Admitting: Physician Assistant

## 2023-02-15 DIAGNOSIS — N200 Calculus of kidney: Secondary | ICD-10-CM | POA: Diagnosis not present

## 2023-02-15 NOTE — Progress Notes (Signed)
Virtual Visit via Video Note  I connected with Pamela Booker on 02/15/23 at  3:30 PM EDT by a video enabled telemedicine application and verified that I am speaking with the correct person using two identifiers.  Location: Patient: Home in [place], [place] Provider: Clinic in [place], [place]   I discussed the limitations of evaluation and management by telemedicine and the availability of in person appointments. The patient expressed understanding and agreed to proceed.  History of Present Illness: Pamela Booker is a 27 y.o. female with PMH epilepsy with vagus nerve stimulator, endometriosis, and recurrent nephrolithiasis who underwent staged bilateral ureteroscopy with laser lithotripsy and stent placement with Dr. Lonna Cobb on 01/02/2023 and 01/09/2023 who presents today for postop follow-up.  Today she reports that her flank pain has resolved and she has no acute concerns.  She is very pleased with her surgical outcome.  She previously attempted a metabolic workup, however she mailed in her urine specimen and a blood specimen was not able to be drawn.  No [facility] report available to review.  She has had 2 stone analyses with mixed calcium oxalate and hydroxyapatite composition.  She reports she is trying to conceive and has stopped most medications.  She is on a prenatal vitamin, unclear calcium content.  She has been cutting back on sweet tea and increasing p.o. hydration.   Observations/Objective: Asking appropriate questions, in no apparent distress.  Assessment and Plan: 1. Bilateral nephrolithiasis She denies flank pain and reports doing well since undergoing staged bilateral URS.  Unfortunately, [facility] was not able to be completed, but she still would like to do this, so we will set her up to do this again.  We discussed that she should plan to drop off her urine specimen and get her blood drawn in clinic.  We discussed general stone prevention recommendations  including increasing p.o. hydration, decreasing dietary oxalate, maintaining moderate dietary calcium, and reducing dietary sodium.  I provided her with links to my oxalate and calcium references and will reach out via MyChart with these as well.  All questions answered. - [facility] Serum Panel; Future - [facility] 24Hr Urine Panel; Future   Follow Up Instructions: Return for Will call with results.    I discussed the assessment and treatment plan with the patient. The patient was provided an opportunity to ask questions and all were answered. The patient agreed with the plan and demonstrated an understanding of the instructions.   The patient was advised to call back or seek an in-person evaluation if the symptoms worsen or if the condition fails to improve as anticipated.  I provided 25 minutes of non-face-to-face time during this encounter.   Carman Ching, PA-C

## 2023-03-02 ENCOUNTER — Emergency Department (HOSPITAL_BASED_OUTPATIENT_CLINIC_OR_DEPARTMENT_OTHER): Payer: 59

## 2023-03-02 ENCOUNTER — Encounter (HOSPITAL_BASED_OUTPATIENT_CLINIC_OR_DEPARTMENT_OTHER): Payer: Self-pay

## 2023-03-02 ENCOUNTER — Emergency Department (HOSPITAL_BASED_OUTPATIENT_CLINIC_OR_DEPARTMENT_OTHER)
Admission: EM | Admit: 2023-03-02 | Discharge: 2023-03-02 | Discharge status: Against Medical Advice | Payer: 59 | Attending: Emergency Medicine | Admitting: Emergency Medicine

## 2023-03-02 ENCOUNTER — Other Ambulatory Visit: Payer: Self-pay

## 2023-03-02 ENCOUNTER — Emergency Department (HOSPITAL_BASED_OUTPATIENT_CLINIC_OR_DEPARTMENT_OTHER)
Admission: EM | Admit: 2023-03-02 | Discharge: 2023-03-02 | Disposition: A | Discharge status: Home / Self Care | Payer: 59 | Source: Home / Self Care | Attending: Emergency Medicine | Admitting: Emergency Medicine

## 2023-03-02 DIAGNOSIS — R102 Pelvic and perineal pain: Secondary | ICD-10-CM

## 2023-03-02 DIAGNOSIS — Z5321 Procedure and treatment not carried out due to patient leaving prior to being seen by health care provider: Secondary | ICD-10-CM | POA: Diagnosis not present

## 2023-03-02 DIAGNOSIS — R1032 Left lower quadrant pain: Secondary | ICD-10-CM | POA: Insufficient documentation

## 2023-03-02 DIAGNOSIS — G40909 Epilepsy, unspecified, not intractable, without status epilepticus: Secondary | ICD-10-CM | POA: Diagnosis not present

## 2023-03-02 DIAGNOSIS — R109 Unspecified abdominal pain: Secondary | ICD-10-CM | POA: Diagnosis present

## 2023-03-02 DIAGNOSIS — D649 Anemia, unspecified: Secondary | ICD-10-CM | POA: Insufficient documentation

## 2023-03-02 LAB — CBC
HCT: 44.8 % (ref 36.0–46.0)
Hemoglobin: 15.3 g/dL — ABNORMAL HIGH (ref 12.0–15.0)
MCH: 32.6 pg (ref 26.0–34.0)
MCHC: 34.2 g/dL (ref 30.0–36.0)
MCV: 95.3 fL (ref 80.0–100.0)
Platelets: 280 10*3/uL (ref 150–400)
RBC: 4.7 MIL/uL (ref 3.87–5.11)
RDW: 11.9 % (ref 11.5–15.5)
WBC: 9.2 10*3/uL (ref 4.0–10.5)
nRBC: 0 % (ref 0.0–0.2)

## 2023-03-02 LAB — COMPREHENSIVE METABOLIC PANEL
ALT: 18 U/L (ref 0–44)
AST: 19 U/L (ref 15–41)
Albumin: 4.8 g/dL (ref 3.5–5.0)
Alkaline Phosphatase: 58 U/L (ref 38–126)
Anion gap: 11 (ref 5–15)
BUN: 9 mg/dL (ref 6–20)
CO2: 23 mmol/L (ref 22–32)
Calcium: 10 mg/dL (ref 8.9–10.3)
Chloride: 103 mmol/L (ref 98–111)
Creatinine, Ser: 0.69 mg/dL (ref 0.44–1.00)
GFR, Estimated: >60 mL/min (ref >60)
Glucose, Bld: 114 mg/dL — ABNORMAL HIGH (ref 70–99)
Potassium: 3.6 mmol/L (ref 3.5–5.1)
Sodium: 137 mmol/L (ref 135–145)
Total Bilirubin: 0.9 mg/dL (ref 0.3–1.2)
Total Protein: 7.7 g/dL (ref 6.5–8.1)

## 2023-03-02 LAB — URINALYSIS, ROUTINE W REFLEX MICROSCOPIC
Glucose, UA: 100 mg/dL — AB
Ketones, ur: >=80 mg/dL — AB
Leukocytes,Ua: NEGATIVE
Nitrite: NEGATIVE
Protein, ur: NEGATIVE mg/dL
Specific Gravity, Urine: 1.025 (ref 1.005–1.030)
pH: 5.5 (ref 5.0–8.0)

## 2023-03-02 LAB — URINALYSIS, MICROSCOPIC (REFLEX)

## 2023-03-02 LAB — LIPASE, BLOOD: Lipase: 38 U/L (ref 11–51)

## 2023-03-02 LAB — PREGNANCY, URINE: Preg Test, Ur: NEGATIVE

## 2023-03-02 MED ORDER — KETOROLAC TROMETHAMINE 15 MG/ML IJ SOLN
15.0000 mg | Freq: Once | INTRAMUSCULAR | Status: AC
  Administered 2023-03-02: 15 mg via INTRAMUSCULAR
  Filled 2023-03-02: qty 1

## 2023-03-02 MED ORDER — OXYCODONE-ACETAMINOPHEN 5-325 MG PO TABS
1.0000 | ORAL_TABLET | Freq: Once | ORAL | Status: AC
  Administered 2023-03-02: 1 via ORAL
  Filled 2023-03-02: qty 1

## 2023-03-02 MED ORDER — OXYCODONE-ACETAMINOPHEN 5-325 MG PO TABS
1.0000 | ORAL_TABLET | Freq: Four times a day (QID) | ORAL | 0 refills | Status: DC | PRN
Start: 2023-03-02 — End: 2023-05-09

## 2023-03-02 NOTE — Discharge Instructions (Signed)
Please read and follow all provided instructions.  Your diagnoses today include:  1. Pelvic pain     Tests performed today include: Blood cell counts and platelets Kidney and liver function tests Pancreas function test (called lipase) Urine test to look for infection A blood or urine test for pregnancy (women only) Ultrasound of the pelvis was normal without any concerning findings Vital signs. See below for your results today.   Medications prescribed:  Percocet (oxycodone/acetaminophen) - narcotic pain medication  DO NOT drive or perform any activities that require you to be awake and alert because this medicine can make you drowsy. BE VERY CAREFUL not to take multiple medicines containing Tylenol (also called acetaminophen). Doing so can lead to an overdose which can damage your liver and cause liver failure and possibly death.  Take any prescribed medications only as directed.  Home care instructions:  Follow any educational materials contained in this packet.  Follow-up instructions: Please follow-up with your primary care provider/GYN in the next 3 days for further evaluation of your symptoms.    Return instructions:  SEEK IMMEDIATE MEDICAL ATTENTION IF: The pain does not go away or becomes severe  A temperature above 101F develops  Repeated vomiting occurs (multiple episodes)  The pain becomes localized to portions of the abdomen. The right side could possibly be appendicitis. In an adult, the left lower portion of the abdomen could be colitis or diverticulitis.  Blood is being passed in stools or vomit (bright red or black tarry stools)  You develop chest pain, difficulty breathing, dizziness or fainting, or become confused, poorly responsive, or inconsolable (young children) If you have any other emergent concerns regarding your health  Additional Information: Abdominal (belly) pain can be caused by many things. Your caregiver performed an examination and possibly  ordered blood/urine tests and imaging (CT scan, x-rays, ultrasound). Many cases can be observed and treated at home after initial evaluation in the emergency department. Even though you are being discharged home, abdominal pain can be unpredictable. Therefore, you need a repeated exam if your pain does not resolve, returns, or worsens. Most patients with abdominal pain don't have to be admitted to the hospital or have surgery, but serious problems like appendicitis and gallbladder attacks can start out as nonspecific pain. Many abdominal conditions cannot be diagnosed in one visit, so follow-up evaluations are very important.  Your vital signs today were: BP 128/79   Pulse 83   Temp 98.3 F (36.8 C) (Oral)   Resp 16   Ht 5\' 2"  (1.575 m)   Wt 60.8 kg   LMP 03/02/2023 (Exact Date)   SpO2 100%   BMI 24.51 kg/m  If your blood pressure (bp) was elevated above 135/85 this visit, please have this repeated by your doctor within one month. --------------

## 2023-03-02 NOTE — ED Triage Notes (Signed)
The patient is having left lower abd pain. She is concerned she has an ovarian cyst. No fever.

## 2023-03-02 NOTE — ED Provider Notes (Signed)
[place] [facility] Provider Note   CSN: [id number] Arrival date & time: 03/02/23  1820     History  Chief Complaint  Patient presents with   Abdominal Pain    Pamela Booker is a 27 y.o. female.  Patient with history of endometriosis, status post surgery --presents to the emergency department today for evaluation of left lower abdominal/pelvic pain.  Patient was expecting her menstrual period to start yesterday but it did not start until the midday today.  She has had gradual worsening left lower quadrant pain that is aching and sharp in nature.  Unrelieved much with Tylenol and heating pads.  No other vaginal discharge.  No dysuria, increased frequency or urgency.  She states that her pain is similar to endometriosis type pain and thinks that it may be returning after surgery that she had in the past year.  No abdominal surgical history.  She does mention history of epilepsy.  No chest pain or shortness of breath.  No fevers.  She has had some diarrhea but this occurs with her periods sometimes.        Home Medications Prior to Admission medications   Medication Sig Start Date End Date Taking? Authorizing Provider  acebutolol (SECTRAL) 200 MG capsule TAKE ONE CAPSULE BY MOUTH TWICE A DAY DISCONTINUE VERAPAMIL Patient taking differently: Take 200 mg by mouth every morning. TAKE ONE CAPSULE BY MOUTH TWICE A DAY DISCONTINUE VERAPAMIL 06/02/21   Cantwell, Celeste C, PA-C  acetaminophen (TYLENOL) 325 MG tablet Take 650 mg by mouth every 6 (six) hours as needed.    [provider]  diazepam (VALIUM) 5 MG tablet Take 5 mg by mouth 2 (two) times daily as needed for muscle spasms or anxiety (and seizures). 05/31/21   [provider]  Famotidine (PEPCID PO) Take 1 tablet by mouth as needed.    [provider]  ondansetron (ZOFRAN-ODT) 4 MG disintegrating tablet Take 1 tablet (4 mg total) by mouth every 8 (eight) hours as  needed for nausea or vomiting. 12/28/22   Vaillancourt, Lelon Mast, PA-C  oxcarbazepine (TRILEPTAL) 600 MG tablet Take 600 mg by mouth at bedtime. 12/14/22   [provider]  phentermine 37.5 MG capsule Take 37.5 mg by mouth every morning.    [provider]  PHEXXI 1.8-1-0.4 % GEL Place 1 application  vaginally as needed.    [provider]  rizatriptan (MAXALT-MLT) 10 MG disintegrating tablet Take 10 mg by mouth daily as needed for migraine.  03/17/19   [provider]  tiZANidine (ZANAFLEX) 4 MG tablet Take 4-8 mg by mouth as needed for muscle spasms. 06/08/21   [provider]  topiramate (TOPAMAX) 100 MG tablet Take 100 mg by mouth every morning.    [provider]  valACYclovir (VALTREX) 500 MG tablet Take 500 mg by mouth daily as needed.    [provider]  XCOPRI 200 MG TABS Take 1 tablet by mouth at bedtime. 11/16/22 03/20/23  [provider]      Allergies    Nsaids, Ativan [lorazepam], Compazine [prochlorperazine], Diphenhydramine, Ibuprofen, Keppra [levetiracetam], Zolpidem, and Emgality [galcanezumab-gnlm]    Review of Systems   Review of Systems  Physical Exam Updated Vital Signs BP (!) 124/93 (BP Location: Left Arm)   Pulse 98   Temp 98.3 F (36.8 C) (Oral)   Resp 16   Ht 5\' 2"  (1.575 m)   Wt 60.8 kg   LMP 03/02/2023 (Exact Date)   SpO2  98%   BMI 24.51 kg/m  Physical Exam Vitals and nursing note reviewed.  Constitutional:      General: She is not in acute distress.    Appearance: She is well-developed.  HENT:     Head: Normocephalic and atraumatic.     Right Ear: External ear normal.     Left Ear: External ear normal.     Nose: Nose normal.  Eyes:     Conjunctiva/sclera: Conjunctivae normal.  Cardiovascular:     Rate and Rhythm: Normal rate and regular rhythm.     Heart sounds: No murmur heard. Pulmonary:     Effort: No respiratory distress.     Breath sounds: No wheezing, rhonchi or rales.   Abdominal:     Palpations: Abdomen is soft.     Tenderness: There is abdominal tenderness in the left lower quadrant. There is no guarding or rebound.  Musculoskeletal:     Cervical back: Normal range of motion and neck supple.     Right lower leg: No edema.     Left lower leg: No edema.  Skin:    General: Skin is warm and dry.     Findings: No rash.  Neurological:     General: No focal deficit present.     Mental Status: She is alert. Mental status is at baseline.     Motor: No weakness.  Psychiatric:        Mood and Affect: Mood normal.     ED Results / Procedures / Treatments   Labs (all labs ordered are listed, but only abnormal results are displayed) Labs Reviewed  COMPREHENSIVE METABOLIC PANEL - Abnormal; Notable for the following components:      Result Value   Glucose, Bld 114 (*)    All other components within normal limits  CBC - Abnormal; Notable for the following components:   Hemoglobin 15.3 (*)    All other components within normal limits  URINALYSIS, ROUTINE W REFLEX MICROSCOPIC - Abnormal; Notable for the following components:   Glucose, UA 100 (*)    Hgb urine dipstick MODERATE (*)    Bilirubin Urine SMALL (*)    Ketones, ur >=80 (*)    All other components within normal limits  URINALYSIS, MICROSCOPIC (REFLEX) - Abnormal; Notable for the following components:   Bacteria, UA RARE (*)    All other components within normal limits  LIPASE, BLOOD  PREGNANCY, URINE    EKG None  Radiology US PELVIC COMPLETE W TRANSVAGINAL AND TORSION R/O  Result Date: 03/02/2023 CLINICAL DATA:  Left lower quadrant pain EXAM: TRANSABDOMINAL AND TRANSVAGINAL ULTRASOUND OF PELVIS DOPPLER ULTRASOUND OF OVARIES TECHNIQUE: Both transabdominal and transvaginal ultrasound examinations of the pelvis were performed. Transabdominal technique was performed for global imaging of the pelvis including uterus, ovaries, adnexal regions, and pelvic cul-de-sac. It was necessary to proceed  with endovaginal exam following the transabdominal exam to visualize the uterus, endometrium, ovaries and adnexa. Color and duplex Doppler ultrasound was utilized to evaluate blood flow to the ovaries. COMPARISON:  CT 12/28/2022 FINDINGS: Uterus Measurements: 8.7 x 3.8 x 4.4 cm = volume: 75 mL. No fibroids or other mass visualized. Endometrium Thickness: Normal thickness, 5 mm.  No focal abnormality visualized. Right ovary Measurements: 2.5 x 1.3 x 1.9 cm = volume: 6 mL. Normal appearance/no adnexal mass. Left ovary Measurements: 2.3 x 1.4 x 2.1 cm = volume: 4 mL. Normal appearance/no adnexal mass. Pulsed Doppler evaluation of both ovaries demonstrates normal low-resistance arterial and venous waveforms. Other findings No  abnormal free fluid. IMPRESSION: No acute findings or significant abnormality. Electronically Signed   By: Charlett Nose M.D.   On: 03/02/2023 20:25    Procedures Procedures    Medications Ordered in ED Medications  ketorolac (TORADOL) 15 MG/ML injection 15 mg (has no administration in time range)    ED Course/ Medical Decision Making/ A&P    Patient seen and examined. History obtained directly from patient. Work-up including labs, imaging, EKG ordered in triage, if performed, were reviewed.    Labs/EKG: Independently reviewed and interpreted.  This included: CBC shows normal white blood cell count, minimally elevated hemoglobin at 15.3 otherwise unremarkable; CMP and lipase unremarkable; UA with ketones suggestive of dehydration, no signs of infection.  Imaging: Pelvic ultrasound has been performed and results are pending.  Medications/Fluids: Ordered: IM Toradol x 1, patient reports PD history, no active bleeding.  Most recent vital signs reviewed and are as follows: BP (!) 124/93 (BP Location: Left Arm)   Pulse 98   Temp 98.3 F (36.8 C) (Oral)   Resp 16   Ht 5\' 2"  (1.575 m)   Wt 60.8 kg   LMP 03/02/2023 (Exact Date)   SpO2 98%   BMI 24.51 kg/m   Initial  impression: lower abdominal pain  8:52 PM Reassessment performed. Patient continues to appear uncomfortable.  Imaging results reviewed including: Pelvic ultrasound, no evidence of ovarian cyst or torsion  Reviewed pertinent lab work and imaging with patient at bedside. Questions answered.   Most current vital signs reviewed and are as follows: BP 128/79   Pulse 83   Temp 98.3 F (36.8 C) (Oral)   Resp 16   Ht 5\' 2"  (1.575 m)   Wt 60.8 kg   LMP 03/02/2023 (Exact Date)   SpO2 100%   BMI 24.51 kg/m   Plan: Discharge to home.   Prescriptions written for: Percocet #8 tablets  Other home care instructions discussed: Continue use of heat and OTC meds  ED return instructions discussed: The patient was urged to return to the Emergency Department immediately with worsening of current symptoms, worsening abdominal pain, persistent vomiting, blood noted in stools, fever, or any other concerns. The patient verbalized understanding.   Follow-up instructions discussed: Patient encouraged to follow-up with their PCP in 3 days.                                   Medical Decision Making Amount and/or Complexity of Data Reviewed Labs: ordered. Radiology: ordered.  Risk Prescription drug management.   For this patient's complaint of abdominal pain, the following conditions were considered on the differential diagnosis: gastritis/PUD, enteritis/duodenitis, appendicitis, cholelithiasis/cholecystitis, cholangitis, pancreatitis, ruptured viscus, colitis, diverticulitis, small/large bowel obstruction, proctitis, cystitis, pyelonephritis, ureteral colic, aortic dissection, aortic aneurysm. In women, ectopic pregnancy, pelvic inflammatory disease, ovarian cysts, and tubo-ovarian abscess were also considered. Atypical chest etiologies were also considered including ACS, PE, and pneumonia.  The patient's vital signs, pertinent lab work and imaging were reviewed and interpreted as discussed in the ED  course. Hospitalization was considered for further testing, treatments, or serial exams/observation. However as patient is well-appearing, has a stable exam, and reassuring studies today, I do not feel that they warrant admission at this time. This plan was discussed with the patient who verbalizes agreement and comfort with this plan and seems reliable and able to return to the Emergency Department with worsening or changing symptoms.  Final Clinical Impression(s) / ED Diagnoses Final diagnoses:  Pelvic pain    Rx / DC Orders ED Discharge Orders          Ordered    oxyCODONE-acetaminophen (PERCOCET/ROXICET) 5-325 MG tablet  Every 6 hours PRN        03/02/23 2051              Renne Crigler, PA-C 03/02/23 2053    Elayne Snare K, DO 03/02/23 2223

## 2023-03-13 ENCOUNTER — Telehealth: Payer: Self-pay

## 2023-03-13 ENCOUNTER — Other Ambulatory Visit: Payer: Self-pay

## 2023-03-13 DIAGNOSIS — N200 Calculus of kidney: Secondary | ICD-10-CM

## 2023-03-13 NOTE — Telephone Encounter (Signed)
Called pt to informed her that litholink was resent.  LVM for pt to return call

## 2023-04-01 ENCOUNTER — Other Ambulatory Visit: Payer: Self-pay | Admitting: Physician Assistant

## 2023-04-01 DIAGNOSIS — N200 Calculus of kidney: Secondary | ICD-10-CM

## 2023-04-13 DIAGNOSIS — N912 Amenorrhea, unspecified: Secondary | ICD-10-CM | POA: Diagnosis not present

## 2023-04-13 DIAGNOSIS — F4321 Adjustment disorder with depressed mood: Secondary | ICD-10-CM | POA: Diagnosis not present

## 2023-04-13 DIAGNOSIS — O3680X0 Pregnancy with inconclusive fetal viability, not applicable or unspecified: Secondary | ICD-10-CM | POA: Diagnosis not present

## 2023-04-13 DIAGNOSIS — O200 Threatened abortion: Secondary | ICD-10-CM | POA: Diagnosis not present

## 2023-04-16 DIAGNOSIS — O200 Threatened abortion: Secondary | ICD-10-CM | POA: Diagnosis not present

## 2023-04-20 DIAGNOSIS — O200 Threatened abortion: Secondary | ICD-10-CM | POA: Diagnosis not present

## 2023-04-27 DIAGNOSIS — O3680X0 Pregnancy with inconclusive fetal viability, not applicable or unspecified: Secondary | ICD-10-CM | POA: Diagnosis not present

## 2023-04-27 DIAGNOSIS — G40909 Epilepsy, unspecified, not intractable, without status epilepticus: Secondary | ICD-10-CM | POA: Diagnosis not present

## 2023-04-27 DIAGNOSIS — O09299 Supervision of pregnancy with other poor reproductive or obstetric history, unspecified trimester: Secondary | ICD-10-CM

## 2023-04-27 DIAGNOSIS — N910 Primary amenorrhea: Secondary | ICD-10-CM | POA: Diagnosis not present

## 2023-04-27 DIAGNOSIS — Z3201 Encounter for pregnancy test, result positive: Secondary | ICD-10-CM | POA: Diagnosis not present

## 2023-04-27 DIAGNOSIS — O0990 Supervision of high risk pregnancy, unspecified, unspecified trimester: Secondary | ICD-10-CM | POA: Insufficient documentation

## 2023-04-27 HISTORY — DX: Supervision of pregnancy with other poor reproductive or obstetric history, unspecified trimester: O09.299

## 2023-04-30 ENCOUNTER — Ambulatory Visit
Admission: EM | Admit: 2023-04-30 | Discharge: 2023-04-30 | Disposition: A | Discharge status: Home / Self Care | Payer: 59 | Attending: Family Medicine | Admitting: Family Medicine

## 2023-04-30 DIAGNOSIS — R5383 Other fatigue: Secondary | ICD-10-CM | POA: Diagnosis not present

## 2023-04-30 DIAGNOSIS — R519 Headache, unspecified: Secondary | ICD-10-CM

## 2023-04-30 LAB — POC COVID19/FLU A&B COMBO
Covid Antigen, POC: NEGATIVE
Influenza A Antigen, POC: NEGATIVE
Influenza B Antigen, POC: NEGATIVE

## 2023-04-30 NOTE — Discharge Instructions (Signed)
If you develop any dizziness or weakness or any signs or symptoms of seizure or go immediately to the emergency department.

## 2023-04-30 NOTE — ED Triage Notes (Signed)
Patient to Urgent Care with complaints of headache/ nasal congestion/ feeling run down.  Symptoms started this morning.  Has been taking tylenol. Possible fevers.

## 2023-04-30 NOTE — ED Provider Notes (Signed)
Pamela Booker    CSN: [id number] Arrival date & time: 04/30/23  1755      History   Chief Complaint Chief Complaint  Patient presents with   Headache    HPI Pamela Booker is a 27 y.o. female.     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   PVC (premature ventricular contraction) 03/11/2018   Endometriosis 03/14/2017   Atypical chest pain 04/25/2016   Palpitations 04/25/2016   Shortness of breath 04/25/2016   Chronic migraine without aura without status migrainosus, not intractable 12/11/2014    Past Surgical History:  Procedure Laterality Date   CYSTOSCOPY W/ URETERAL STENT REMOVAL Left 01/09/2023   Procedure: CYSTOSCOPY WITH STENT REMOVAL;  Surgeon: Riki Altes, MD;  Location: [facility];  Service: Urology;  Laterality: Left;   CYSTOSCOPY/URETEROSCOPY/HOLMIUM LASER/STENT PLACEMENT Left 01/02/2023   Procedure: CYSTOSCOPY/URETEROSCOPY/HOLMIUM LASER/STENT PLACEMENT;  Surgeon: Riki Altes, MD;  Location: [facility];  Service: Urology;  Laterality: Left;   CYSTOSCOPY/URETEROSCOPY/HOLMIUM  LASER/STENT PLACEMENT Right 01/09/2023   Procedure: CYSTOSCOPY/URETEROSCOPY/HOLMIUM LASER/STENT PLACEMENT;  Surgeon: Riki Altes, MD;  Location: [facility];  Service: Urology;  Laterality: Right;   ENDOMETRIAL ABLATION  06/2022   [facility]   LAPAROSCOPY     x5 for endometriosis   LOOP RECORDER INSERTION  2020   LOOP RECORDER REMOVAL  2021   TONSILLECTOMY     URETEROSCOPY Left 01/09/2023   Procedure: URETEROSCOPY;  Surgeon: Riki Altes, MD;  Location: [facility];  Service: Urology;  Laterality: Left;   VAGUS NERVE STIMULATOR INSERTION  2022    OB History     Gravida  1   Para  0   Term  0   Preterm  0   AB  0   Living         SAB  0   IAB  0   Ectopic  0   Multiple      Live Births               Home Medications    Prior to Admission medications   Medication Sig Start Date End Date Taking? Authorizing Provider  acebutolol (SECTRAL) 200 MG capsule TAKE ONE CAPSULE BY MOUTH TWICE A DAY DISCONTINUE VERAPAMIL Patient taking differently: Take 200 mg by mouth every morning. TAKE ONE CAPSULE BY MOUTH TWICE A DAY DISCONTINUE VERAPAMIL 06/02/21   Cantwell, Celeste C, PA-C  acetaminophen (TYLENOL) 325 MG tablet Take 650 mg by mouth every 6 (six) hours as needed.  [provider]  diazepam (VALIUM) 5 MG tablet Take 5 mg by mouth 2 (two) times daily as needed for muscle spasms or anxiety (and seizures). 05/31/21   [provider]  Famotidine (PEPCID PO) Take 1 tablet by mouth as needed.    [provider]  ondansetron (ZOFRAN-ODT) 4 MG disintegrating tablet Take 1 tablet (4 mg total) by mouth every 8 (eight) hours as needed for nausea or vomiting. 12/28/22   Vaillancourt, Lelon Mast, PA-C  oxcarbazepine (TRILEPTAL) 600 MG tablet Take 600 mg by mouth at bedtime. 12/14/22   [provider]  oxyCODONE-acetaminophen (PERCOCET/ROXICET) 5-325 MG tablet Take 1 tablet by mouth every 6 (six) hours as needed for severe pain. 03/02/23   Renne Crigler,  PA-C  phentermine 37.5 MG capsule Take 37.5 mg by mouth every morning. Patient not taking: Reported on 04/30/2023    [provider]  PHEXXI 1.8-1-0.4 % GEL Place 1 application  vaginally as needed.    [provider]  rizatriptan (MAXALT-MLT) 10 MG disintegrating tablet Take 10 mg by mouth daily as needed for migraine.  03/17/19   [provider]  tiZANidine (ZANAFLEX) 4 MG tablet Take 4-8 mg by mouth as needed for muscle spasms. 06/08/21   [provider]  topiramate (TOPAMAX) 100 MG tablet Take 100 mg by mouth every morning.    [provider]  valACYclovir (VALTREX) 500 MG tablet Take 500 mg by mouth daily as needed.    [provider]    Family History Family History  Adopted: Yes  Problem Relation Age of Onset   Healthy Daughter     Social History Social History   Tobacco Use   Smoking status: Never   Smokeless tobacco: Never  Vaping Use   Vaping status: Every Day   Substances: Nicotine  Substance Use Topics   Alcohol use: Yes    Comment: socially   Drug use: Yes    Comment: THC     Allergies   Nsaids, Ativan [lorazepam], Compazine [prochlorperazine], Diphenhydramine, Ibuprofen, Keppra [levetiracetam], Zolpidem, and Emgality [galcanezumab-gnlm]   Review of Systems Review of Systems  Neurological:  Positive for headaches.     Physical Exam Triage Vital Signs ED Triage Vitals  Encounter Vitals Group     BP 04/30/23 1927 128/80     Systolic BP Percentile --      Diastolic BP Percentile --      Pulse Rate 04/30/23 1927 67     Resp 04/30/23 1927 18     Temp 04/30/23 1927 98.7 F (37.1 C)     Temp src --      SpO2 04/30/23 1927 99 %     Weight --      Height --      Head Circumference --      Peak Flow --      Pain Score 04/30/23 1917 7     Pain Loc --      Pain Education --      Exclude from Growth Chart --    No data found.  Updated Vital Signs BP 128/80   Pulse 67   Temp 98.7 F (37.1 C)    Resp 18   SpO2 99%   Visual Acuity Right Eye Distance:   Left Eye Distance:   Bilateral Distance:    Right Eye Near:   Left Eye Near:    Bilateral Near:     Physical Exam   UC Treatments / Results  Labs (all labs ordered are  listed, but only abnormal results are displayed) Labs Reviewed - No data to display  EKG   Radiology No results found.  Procedures Procedures (including critical care time)  Medications Ordered in UC Medications - No data to display  Initial Impression / Assessment and Plan / UC Course  I have reviewed the triage vital signs and the nursing notes.  Pertinent labs & imaging results that were available during my care of the patient were reviewed by me and considered in my medical decision making (see chart for details).     *** Final Clinical Impressions(s) / UC Diagnoses   Final diagnoses:  None   Discharge Instructions   None    ED Prescriptions   None    PDMP not reviewed this encounter.

## 2023-05-04 ENCOUNTER — Ambulatory Visit
Admission: EM | Admit: 2023-05-04 | Discharge: 2023-05-04 | Disposition: A | Discharge status: Home / Self Care | Payer: 59 | Attending: Emergency Medicine | Admitting: Emergency Medicine

## 2023-05-04 DIAGNOSIS — N760 Acute vaginitis: Secondary | ICD-10-CM | POA: Diagnosis not present

## 2023-05-04 DIAGNOSIS — B3731 Acute candidiasis of vulva and vagina: Secondary | ICD-10-CM | POA: Diagnosis not present

## 2023-05-04 DIAGNOSIS — B9689 Other specified bacterial agents as the cause of diseases classified elsewhere: Secondary | ICD-10-CM | POA: Insufficient documentation

## 2023-05-04 LAB — WET PREP, GENITAL
Sperm: NONE SEEN
Trich, Wet Prep: NONE SEEN
WBC, Wet Prep HPF POC: >10 — AB (ref <10)

## 2023-05-04 MED ORDER — MICONAZOLE NITRATE 2 % VA CREA: 1.0000 | TOPICAL_CREAM | Freq: Every day | VAGINAL | 0 refills | Status: DC

## 2023-05-04 MED ORDER — METRONIDAZOLE 0.75 % VA GEL: 1.0000 | Freq: Every day | VAGINAL | 0 refills | Status: DC

## 2023-05-04 NOTE — Discharge Instructions (Addendum)
You have tested positive for both a vaginal yeast infection as well as bacterial vaginosis.  Instill 1 applicatorful of Monistat at bedtime for the next 7 days for treatment of your vaginal yeast infection.  Instill 1 applicatorful of the MetroGel at bedtime for neck 7 days for treatment of your bacterial vaginosis.  Follow-up with your OB/GYN or maternal-fetal medicine for reevaluation to ensure resolution of infection.

## 2023-05-04 NOTE — ED Triage Notes (Signed)
Pt c/o "extra" vaginal discharge that is green and yellow in color.   Pt states that she does not drink any water.

## 2023-05-04 NOTE — ED Provider Notes (Signed)
[facility]    CSN: [id number] Arrival date & time: 05/04/23  1127      History   Chief Complaint Chief Complaint  Patient presents with   Vaginal Discharge    HPI Pamela Booker is a 27 y.o. female.   HPI  27 year old female with a past medical history significant for epilepsy, migraines, essential hypertension, endometriosis, HSV-2, and ovarian cysts presents for evaluation of yellow-green vaginal discharge that she noticed today when she wiped while urinating.  She denies any vaginal itching or pain with urination.  Also no urinary urgency or frequency.  She is currently [redacted] weeks pregnant and has been followed by [facility] clinic OB/GYN with a recent referral to maternal-fetal medicine in [place].  She has not had her first appointment.  She denies any concern for sexually transmitted infections as she reports she has been with her fianc for 7 years and they are monogamous.  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   PVC (premature ventricular contraction) 03/11/2018   Endometriosis  03/14/2017   Atypical chest pain 04/25/2016   Palpitations 04/25/2016   Shortness of breath 04/25/2016   Chronic migraine without aura without status migrainosus, not intractable 12/11/2014    Past Surgical History:  Procedure Laterality Date   CYSTOSCOPY W/ URETERAL STENT REMOVAL Left 01/09/2023   Procedure: CYSTOSCOPY WITH STENT REMOVAL;  Surgeon: Riki Altes, MD;  Location: [facility];  Service: Urology;  Laterality: Left;   CYSTOSCOPY/URETEROSCOPY/HOLMIUM LASER/STENT PLACEMENT Left 01/02/2023   Procedure: CYSTOSCOPY/URETEROSCOPY/HOLMIUM LASER/STENT PLACEMENT;  Surgeon: Riki Altes, MD;  Location: [facility];  Service: Urology;  Laterality: Left;   CYSTOSCOPY/URETEROSCOPY/HOLMIUM LASER/STENT PLACEMENT Right 01/09/2023   Procedure: CYSTOSCOPY/URETEROSCOPY/HOLMIUM LASER/STENT PLACEMENT;  Surgeon: Riki Altes, MD;  Location: [facility];  Service: Urology;  Laterality: Right;   ENDOMETRIAL ABLATION  06/2022   [facility]   LAPAROSCOPY     x5 for endometriosis   LOOP RECORDER INSERTION  2020   LOOP RECORDER REMOVAL  2021   TONSILLECTOMY     URETEROSCOPY Left 01/09/2023   Procedure: URETEROSCOPY;  Surgeon: Riki Altes, MD;  Location: [facility];  Service: Urology;  Laterality: Left;   VAGUS NERVE STIMULATOR INSERTION  2022    OB History     Gravida  2   Para  0   Term  0   Preterm  0   AB  0   Living         SAB  0   IAB  0  Ectopic  0   Multiple      Live Births               Home Medications    Prior to Admission medications   Medication Sig Start Date End Date Taking? Authorizing Provider  escitalopram (LEXAPRO) 10 MG tablet Take by mouth. 04/13/23 07/12/23 Yes [provider]  Famotidine (PEPCID PO) Take 1 tablet by mouth as needed.   Yes [provider]  metroNIDAZOLE (METROGEL) 0.75 % vaginal gel Place 1 Applicatorful vaginally at bedtime. 05/04/23  Yes Becky Augusta, NP  miconazole (MONISTAT 7) 2 % vaginal cream Place 1  Applicatorful vaginally at bedtime. 05/04/23  Yes Becky Augusta, NP  ondansetron (ZOFRAN-ODT) 4 MG disintegrating tablet Take 1 tablet (4 mg total) by mouth every 8 (eight) hours as needed for nausea or vomiting. 12/28/22  Yes Vaillancourt, Lelon Mast, PA-C  valACYclovir (VALTREX) 500 MG tablet Take 500 mg by mouth daily as needed.   Yes [provider]  acebutolol (SECTRAL) 200 MG capsule TAKE ONE CAPSULE BY MOUTH TWICE A DAY DISCONTINUE VERAPAMIL Patient taking differently: Take 200 mg by mouth every morning. TAKE ONE CAPSULE BY MOUTH TWICE A DAY DISCONTINUE VERAPAMIL 06/02/21   Cantwell, Celeste C, PA-C  acetaminophen (TYLENOL) 325 MG tablet Take 650 mg by mouth every 6 (six) hours as needed.    [provider]  diazepam (VALIUM) 5 MG tablet Take 5 mg by mouth 2 (two) times daily as needed for muscle spasms or anxiety (and seizures). Patient not taking: Reported on 04/30/2023 05/31/21   [provider]  oxcarbazepine (TRILEPTAL) 600 MG tablet Take 600 mg by mouth at bedtime. Patient not taking: Reported on 04/30/2023 12/14/22   [provider]  oxyCODONE-acetaminophen (PERCOCET/ROXICET) 5-325 MG tablet Take 1 tablet by mouth every 6 (six) hours as needed for severe pain. Patient not taking: Reported on 04/30/2023 03/02/23   Renne Crigler, PA-C  phentermine 37.5 MG capsule Take 37.5 mg by mouth every morning. Patient not taking: Reported on 04/30/2023    [provider]  PHEXXI 1.8-1-0.4 % GEL Place 1 application  vaginally as needed.    [provider]  rizatriptan (MAXALT-MLT) 10 MG disintegrating tablet Take 10 mg by mouth daily as needed for migraine.  Patient not taking: Reported on 04/30/2023 03/17/19   [provider]  tiZANidine (ZANAFLEX) 4 MG tablet Take 4-8 mg by mouth as needed for muscle spasms. Patient not taking: Reported on 04/30/2023 06/08/21   [provider]  topiramate (TOPAMAX) 100 MG tablet Take 100 mg by  mouth every morning. Patient not taking: Reported on 04/30/2023    [provider]    Family History Family History  Adopted: Yes  Problem Relation Age of Onset   Healthy Daughter     Social History Social History   Tobacco Use   Smoking status: Never   Smokeless tobacco: Never  Vaping Use   Vaping status: Former   Substances: Nicotine  Substance Use Topics   Alcohol use: Not Currently    Comment: socially   Drug use: Not Currently    Comment: THC     Allergies   Nsaids, Ativan [lorazepam], Compazine [prochlorperazine], Diphenhydramine, Ibuprofen, Keppra [levetiracetam], Zolpidem, and Emgality [galcanezumab-gnlm]   Review of Systems Review of Systems  Constitutional:  Negative for fever.  Genitourinary:  Positive for vaginal discharge. Negative for dysuria, frequency, hematuria, urgency and vaginal pain.     Physical Exam Triage Vital Signs ED Triage Vitals  Encounter Vitals  Group     BP      Systolic BP Percentile      Diastolic BP Percentile      Pulse      Resp      Temp      Temp src      SpO2      Weight      Height      Head Circumference      Peak Flow      Pain Score      Pain Loc      Pain Education      Exclude from Growth Chart    No data found.  Updated Vital Signs BP 120/75 (BP Location: Left Arm)   Pulse 80   Temp 98.4 F (36.9 C) (Oral)   Ht 5\' 2"  (1.575 m)   Wt 143 lb (64.9 kg)   LMP 03/02/2023 (Exact Date)   SpO2 100%   BMI 26.16 kg/m   Visual Acuity Right Eye Distance:   Left Eye Distance:   Bilateral Distance:    Right Eye Near:   Left Eye Near:    Bilateral Near:     Physical Exam Vitals and nursing note reviewed.  Constitutional:      Appearance: Normal appearance. She is not ill-appearing.  HENT:     Head: Normocephalic and atraumatic.  Cardiovascular:     Rate and Rhythm: Normal rate and regular rhythm.     Pulses: Normal pulses.     Heart sounds: Normal heart sounds. No murmur heard.    No  friction rub. No gallop.  Pulmonary:     Effort: Pulmonary effort is normal.     Breath sounds: Normal breath sounds. No wheezing, rhonchi or rales.  Abdominal:     Tenderness: There is no right CVA tenderness or left CVA tenderness.  Skin:    General: Skin is warm and dry.     Capillary Refill: Capillary refill takes less than 2 seconds.     Findings: No rash.  Neurological:     General: No focal deficit present.     Mental Status: She is alert and oriented to person, place, and time.      UC Treatments / Results  Labs (all labs ordered are listed, but only abnormal results are displayed) Labs Reviewed  WET PREP, GENITAL - Abnormal; Notable for the following components:      Result Value   Yeast Wet Prep HPF POC PRESENT (*)    Clue Cells Wet Prep HPF POC PRESENT (*)    WBC, Wet Prep HPF POC >10 (*)    All other components within normal limits    EKG   Radiology No results found.  Procedures Procedures (including critical care time)  Medications Ordered in UC Medications - No data to display  Initial Impression / Assessment and Plan / UC Course  I have reviewed the triage vital signs and the nursing notes.  Pertinent labs & imaging results that were available during my care of the patient were reviewed by me and considered in my medical decision making (see chart for details).   Patient is a pleasant, nontoxic-appearing 27 year old female presenting for evaluation of vaginal discharge without any associated urinary symptoms or vaginal itching.  Patient is currently being followed by [facility] clinic OB/GYN and has been referred to maternal-fetal medicine in [place] due to being high risk pregnancy secondary to her epilepsy.  She is concerned because she noticed a yellow-green discharge when she  wiped this morning but she has not had any associated vaginal itching.  She does not have any concern for STIs as she reports that she has been with her fianc, the father of  the baby, for the last 7 years and they are monogamous.  I will order a vaginal wet prep to evaluate for the presence of BV or yeast.  Vaginal wet prep is positive for both yeast and clue cells.  I will discharge patient home on Monistat once nightly for 7 nights for treatment of her yeast infection and MetroGel 0.75% cream once nightly for 7 nights for treatment of bacterial vaginosis.  She should follow-up with her OB/GYN or with maternal-fetal medicine for any continued or worsening symptoms.   Final Clinical Impressions(s) / UC Diagnoses   Final diagnoses:  BV (bacterial vaginosis)  Vaginal yeast infection     Discharge Instructions      You have tested positive for both a vaginal yeast infection as well as bacterial vaginosis.  Instill 1 applicatorful of Monistat at bedtime for the next 7 days for treatment of your vaginal yeast infection.  Instill 1 applicatorful of the MetroGel at bedtime for neck 7 days for treatment of your bacterial vaginosis.  Follow-up with your OB/GYN or maternal-fetal medicine for reevaluation to ensure resolution of infection.     ED Prescriptions     Medication Sig Dispense Auth. Provider   metroNIDAZOLE (METROGEL) 0.75 % vaginal gel Place 1 Applicatorful vaginally at bedtime. 70 g Becky Augusta, NP   miconazole (MONISTAT 7) 2 % vaginal cream Place 1 Applicatorful vaginally at bedtime. 45 g Becky Augusta, NP      PDMP not reviewed this encounter.   Becky Augusta, NP 05/04/23 1306

## 2023-05-09 ENCOUNTER — Encounter: Payer: Self-pay | Admitting: Family Medicine

## 2023-05-09 ENCOUNTER — Other Ambulatory Visit: Payer: Self-pay

## 2023-05-09 ENCOUNTER — Ambulatory Visit (INDEPENDENT_AMBULATORY_CARE_PROVIDER_SITE_OTHER): Payer: 59 | Admitting: Family Medicine

## 2023-05-09 ENCOUNTER — Other Ambulatory Visit (HOSPITAL_COMMUNITY)
Admission: RE | Admit: 2023-05-09 | Discharge: 2023-05-09 | Disposition: A | Discharge status: Home / Self Care | Payer: 59 | Source: Ambulatory Visit | Attending: Family Medicine | Admitting: Family Medicine

## 2023-05-09 VITALS — BP 125/83 | HR 78 | Wt 145.2 lb

## 2023-05-09 DIAGNOSIS — O09299 Supervision of pregnancy with other poor reproductive or obstetric history, unspecified trimester: Secondary | ICD-10-CM

## 2023-05-09 DIAGNOSIS — N921 Excessive and frequent menstruation with irregular cycle: Secondary | ICD-10-CM

## 2023-05-09 DIAGNOSIS — O0991 Supervision of high risk pregnancy, unspecified, first trimester: Secondary | ICD-10-CM | POA: Diagnosis not present

## 2023-05-09 DIAGNOSIS — R87612 Low grade squamous intraepithelial lesion on cytologic smear of cervix (LGSIL): Secondary | ICD-10-CM

## 2023-05-09 DIAGNOSIS — Z3A01 Less than 8 weeks gestation of pregnancy: Secondary | ICD-10-CM

## 2023-05-09 DIAGNOSIS — O09291 Supervision of pregnancy with other poor reproductive or obstetric history, first trimester: Secondary | ICD-10-CM | POA: Diagnosis not present

## 2023-05-09 DIAGNOSIS — O0990 Supervision of high risk pregnancy, unspecified, unspecified trimester: Secondary | ICD-10-CM | POA: Diagnosis not present

## 2023-05-09 DIAGNOSIS — G40909 Epilepsy, unspecified, not intractable, without status epilepticus: Secondary | ICD-10-CM | POA: Diagnosis not present

## 2023-05-09 DIAGNOSIS — R112 Nausea with vomiting, unspecified: Secondary | ICD-10-CM

## 2023-05-09 MED ORDER — DIAZEPAM 10 MG RE GEL: 10.0000 mg | Freq: Once | RECTAL | 2 refills | Status: DC

## 2023-05-09 MED ORDER — FAMOTIDINE 20 MG PO TABS
20.0000 mg | ORAL_TABLET | Freq: Two times a day (BID) | ORAL | 3 refills | Status: DC
Start: 2023-05-09 — End: 2023-09-14

## 2023-05-09 MED ORDER — ASPIRIN 81 MG PO TBEC
81.0000 mg | DELAYED_RELEASE_TABLET | Freq: Every day | ORAL | 2 refills | Status: DC
Start: 2023-05-09 — End: 2023-05-21

## 2023-05-09 MED ORDER — OXCARBAZEPINE 150 MG PO TABS
150.0000 mg | ORAL_TABLET | Freq: Every day | ORAL | 5 refills | Status: DC
Start: 2023-05-09 — End: 2023-09-04

## 2023-05-09 MED ORDER — DIAZEPAM 10 MG PO TABS
10.0000 mg | ORAL_TABLET | Freq: Four times a day (QID) | ORAL | 0 refills | Status: DC | PRN
Start: 2023-05-09 — End: 2024-01-19

## 2023-05-09 NOTE — Progress Notes (Signed)
INITIAL PRENATAL VISIT  Subjective:   Pamela Booker is being seen today for her first obstetrical visit.  This is a planned pregnancy. This is a desired pregnancy.  She is at [redacted]w[redacted]d gestation by LMP. 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 Relationship with FOB: spouse, living together. Patient does intend to breast feed. Pregnancy history fully reviewed.  Patient reports  She stopped all medications-- prior to pregnancy she was seizure free for 4 months after having vagal nerve stimulator adjusted (needs leads repositioned). Reports she was told to stop her medications which were .  Indications for ASA therapy (per uptodate) One of the following: Previous pregnancy with preeclampsia, especially early onset and with an adverse outcome No Multifetal gestation No Chronic hypertension No Type 1 or 2 diabetes mellitus No Chronic kidney disease No Autoimmune disease (antiphospholipid syndrome, systemic lupus erythematosus) No  Two or more of the following: Nulliparity No Obesity (body mass index >30 kg/m2) No Family history of preeclampsia in mother or sister No Age >=35 years No Sociodemographic characteristics (African American race, low socioeconomic level) No Personal risk factors (eg, previous pregnancy with low birth weight or small for gestational age infant, previous adverse pregnancy outcome [eg, stillbirth], interval >10 years between pregnancies) No  Indications for early GDM screening - HA1C   Review of Systems:   Review of Systems  Objective:    Obstetric History OB History  Gravida Para Term Preterm AB Living  4 1 1  0 2 1  SAB IAB Ectopic Multiple Live Births  2 0 0 0 1    # Outcome Date GA Lbr Len/2nd Weight Sex Type Anes PTL Lv  4 Current           3 SAB 2021     Biochemical     2 SAB 2019          1 Term 10/17/13 [redacted]w[redacted]d  6 lb 2 oz (2.778 kg) F Vag-Spont   LIV     Complications: Preeclampsia     Past Medical History:  Diagnosis Date   Anxiety    Bilateral nephrolithiasis    Depression    Endometriosis    Epilepsy (HCC)    GERD (gastroesophageal reflux disease)    History of kidney stones    HSV-2 infection    Hypertension    Migraines    Nausea & vomiting 11/23/2022   Ovarian cyst    Palpitations    PVC (premature ventricular contraction)    Seizures (HCC)    last seizure was 2 weeks ago (app.12-18-22)   Status post placement of VNS (vagus nerve stimulation) device    Syncope and collapse 06/10/2019   Thyroid nodule     Past Surgical History:  Procedure Laterality Date   CYSTOSCOPY W/ URETERAL STENT REMOVAL Left 01/09/2023   Procedure: CYSTOSCOPY WITH STENT REMOVAL;  Surgeon: Riki Altes, MD;  Location: [facility];  Service: Urology;  Laterality: Left;   CYSTOSCOPY/URETEROSCOPY/HOLMIUM LASER/STENT PLACEMENT Left 01/02/2023   Procedure: CYSTOSCOPY/URETEROSCOPY/HOLMIUM LASER/STENT PLACEMENT;  Surgeon: Riki Altes, MD;  Location: [facility];  Service: Urology;  Laterality: Left;   CYSTOSCOPY/URETEROSCOPY/HOLMIUM LASER/STENT PLACEMENT Right 01/09/2023   Procedure: CYSTOSCOPY/URETEROSCOPY/HOLMIUM LASER/STENT PLACEMENT;  Surgeon: Riki Altes, MD;  Location: [facility];  Service: Urology;  Laterality: Right;   ENDOMETRIAL ABLATION  06/2022   [facility]   LAPAROSCOPY     x5 for endometriosis   LOOP RECORDER INSERTION  2020   LOOP RECORDER REMOVAL  2021   TONSILLECTOMY     URETEROSCOPY Left 01/09/2023   Procedure: URETEROSCOPY;  Surgeon: Riki Altes, MD;  Location: [facility];  Service: Urology;  Laterality: Left;   VAGUS NERVE STIMULATOR INSERTION  2022    Current Outpatient Medications on File Prior to Visit  Medication Sig Dispense Refill   acetaminophen (TYLENOL) 325 MG tablet Take 650 mg by mouth every 6 (six) hours as needed.     escitalopram (LEXAPRO) 10 MG tablet Take by mouth.     Ferrous Sulfate (IRON PO) Take 1 tablet by mouth daily.     valACYclovir  (VALTREX) 500 MG tablet Take 500 mg by mouth daily as needed. (Patient not taking: Reported on 05/09/2023)     No current facility-administered medications on file prior to visit.    Allergies  Allergen Reactions   Nsaids Other (See Comments)    History of bleeding ulcers   Ativan [Lorazepam] Itching   Compazine [Prochlorperazine]    Diphenhydramine Other (See Comments)    "Crawling feeling under skin"   Ibuprofen     Other reaction(s): GI Upset (intolerance)   Keppra [Levetiracetam] Other (See Comments)    "Makes her feel hungover"    Zolpidem Other (See Comments)    seizure   Emgality [Galcanezumab-Gnlm] Rash    Social History:  reports that she has never smoked. She has never used smokeless tobacco. She reports that she does not currently use alcohol. She reports that she does not currently use drugs.  Family History  Adopted: Yes  Problem Relation Age of Onset   Healthy Daughter     The following portions of the patient's history were reviewed and updated as appropriate: allergies, current medications, past family history, past medical history, past social history, past surgical history and problem list.  Review of Systems Review of Systems  Constitutional:  Negative for chills and fever.  HENT:  Negative for congestion and sore throat.   Eyes:  Negative for pain and visual disturbance.  Respiratory:  Negative for cough, chest tightness and shortness of breath.   Cardiovascular:  Negative for chest pain.  Gastrointestinal:  Negative for abdominal pain, diarrhea, nausea and vomiting.  Endocrine: Negative for cold intolerance and heat intolerance.  Genitourinary:  Negative for dysuria and flank pain.  Musculoskeletal:  Negative for back pain.  Skin:  Negative for rash.  Allergic/Immunologic: Negative for food allergies.  Neurological:  Negative for dizziness and light-headedness.  Psychiatric/Behavioral:  Negative for agitation.       Physical Exam:  BP 125/83    Pulse 78   Wt 145 lb 3.2 oz (65.9 kg)   LMP 03/02/2023 (Exact Date)   BMI 26.56 kg/m  CONSTITUTIONAL: Well-developed, well-nourished female in no acute distress.  HENT:  Normocephalic, atraumatic.  Oropharynx is clear and moist EYES: Conjunctivae normal. No scleral icterus.  NECK: Normal range of motion, supple, no masses.  Normal thyroid.  SKIN: Skin is warm and dry. No rash noted. Not diaphoretic. No erythema. No pallor. MUSCULOSKELETAL: Normal range of motion. No tenderness.  No cyanosis, clubbing, or edema.   NEUROLOGIC: Alert and oriented to person, place, and time. Normal muscle tone coordination.  PSYCHIATRIC: Normal mood and affect. Normal behavior. Normal judgment and thought content. CARDIOVASCULAR: Normal heart rate noted, regular rhythm RESPIRATORY: Clear to auscultation bilaterally. Effort and breath sounds normal, no problems with respiration noted. BREASTS: Symmetric in size. No masses, skin changes, nipple drainage, or lymphadenopathy. ABDOMEN: Soft, normal bowel sounds, no distention noted.  No tenderness,  rebound or guarding.  PELVIC: not indicated  CHEST: had deep brain stimulator on the left chest wall.     Movement: Absent       Assessment:    Pregnancy: G4P1021  1. Nausea and vomiting, unspecified vomiting type Managed well currently Had GERD prior to pregnancy. Start famotidine today.  - famotidine (PEPCID) 20 MG tablet; Take 1 tablet (20 mg total) by mouth 2 (two) times daily.  Dispense: 60 tablet; Refill: 3  2. Supervision of high risk pregnancy, antepartum - Culture, OB Urine - CBC/D/Plt+RPR+Rh+ABO+RubIgG... - GC/Chlamydia probe amp ([place])not at [facility] - Hemoglobin A1c - aspirin EC 81 MG tablet; Take 1 tablet (81 mg total) by mouth daily. Take after 12 weeks for prevention of preeclampsia later in pregnancy  Dispense: 300 tablet; Refill: 2 - [place] [facility] OB 11-14 WEEK ANATOMY; Future  3. Irregular intermenstrual bleeding Had viability at [facility] Reports  FHR 95  4. LGSIL on Pap smear of cervix At [facility]-- 5/24 was NIL  5. History of pre-eclampsia in prior pregnancy, currently pregnant - [place] MFM OB DETAIL +14 WK; Future - aspirin EC 81 MG tablet; Take 1 tablet (81 mg total) by mouth daily. Take after 12 weeks for prevention of preeclampsia later in pregnancy  Dispense: 300 tablet; Refill: 2 - [place] M[facility] OB 11-14 WEEK ANATOMY; Future  6. Nonintractable epilepsy without status epilepticus, unspecified epilepsy type (HCC) - Lengthy discuss about her drug resistant Epilepsy - Reviewed Mother to Baby data about Trileptal. Recommended restarting given her onset of seizures since stopping meds. Also reviewed diazepam PRN to break seizure.  -  Ambulatory referral to Neurology - OXcarbazepine (TRILEPTAL) 150 MG tablet; Take 1 tablet (150 mg total) by mouth daily.  Dispense: 30 tablet; Refill: 5 - diazepam (VALIUM) 10 MG tablet; Take 1 tablet (10 mg total) by mouth every 6 (six) hours as needed (Seizure/aura).  Dispense: 30 tablet; Refill: 0 - diazepam (DIASTAT ACUDIAL) 10 MG GEL; Place 10 mg rectally once for 1 dose.  Dispense: 1 each; Refill: 2 - [place] MFM OB DETAIL +14 WK; Future - Consult to Maternal Fetal Care - [place] MFM OB 11-14 WEEK ANATOMY; Future    Plan:   Initial labs drawn. Prenatal vitamins. Problem list reviewed and updated. Reviewed in detail the nature of the practice with collaborative care between  Genetic screening discussed: NIPS- Desired. Needs draw at 9-11 weeks Role of ultrasound in pregnancy discussed; Anatomy US: ordered. Amniocentesis discussed: not indicated. Follow up in 4 weeks. Discussed clinic routines, schedule of care and testing, genetic screening options, involvement of students and residents under the direct supervision of APPs and doctors and presence of female providers. Pt verbalized understanding.   Federico Flake, MD 05/09/2023 4:00 PM

## 2023-05-10 LAB — CBC/D/PLT+RPR+RH+ABO+RUBIGG...
Antibody Screen: NEGATIVE
Basophils Absolute: 0 10*3/uL (ref 0.0–0.2)
Basos: 0 %
EOS (ABSOLUTE): 0.1 10*3/uL (ref 0.0–0.4)
Eos: 1 %
HCV Ab: NONREACTIVE
HIV Screen 4th Generation wRfx: NONREACTIVE
Hematocrit: 42.8 % (ref 34.0–46.6)
Hemoglobin: 14 g/dL (ref 11.1–15.9)
Hepatitis B Surface Ag: NEGATIVE
Immature Grans (Abs): 0 10*3/uL (ref 0.0–0.1)
Immature Granulocytes: 0 %
Lymphocytes Absolute: 2.4 10*3/uL (ref 0.7–3.1)
Lymphs: 29 %
MCH: 31.8 pg (ref 26.6–33.0)
MCHC: 32.7 g/dL (ref 31.5–35.7)
MCV: 97 fL (ref 79–97)
Monocytes Absolute: 0.6 10*3/uL (ref 0.1–0.9)
Monocytes: 7 %
Neutrophils Absolute: 5 10*3/uL (ref 1.4–7.0)
Neutrophils: 63 %
Platelets: 264 10*3/uL (ref 150–450)
RBC: 4.4 x10E6/uL (ref 3.77–5.28)
RDW: 12.4 % (ref 11.7–15.4)
RPR Ser Ql: NONREACTIVE
Rh Factor: POSITIVE
Rubella Antibodies, IGG: 1.39 {index} (ref >0.99)
WBC: 8.1 10*3/uL (ref 3.4–10.8)

## 2023-05-10 LAB — HEMOGLOBIN A1C
Est. average glucose Bld gHb Est-mCnc: 94 mg/dL
Hgb A1c MFr Bld: 4.9 % (ref 4.8–5.6)

## 2023-05-10 LAB — HCV INTERPRETATION

## 2023-05-11 LAB — CULTURE, OB URINE

## 2023-05-11 LAB — URINE CULTURE, OB REFLEX

## 2023-05-11 LAB — GC/CHLAMYDIA PROBE AMP (~~LOC~~) NOT AT ARMC
Chlamydia: NEGATIVE
Comment: NEGATIVE
Comment: NORMAL
Neisseria Gonorrhea: NEGATIVE

## 2023-05-12 ENCOUNTER — Encounter: Payer: Self-pay | Admitting: Radiology

## 2023-05-12 ENCOUNTER — Emergency Department
Admission: EM | Admit: 2023-05-12 | Discharge: 2023-05-12 | Disposition: A | Discharge status: Home / Self Care | Payer: 59 | Attending: Emergency Medicine | Admitting: Emergency Medicine

## 2023-05-12 ENCOUNTER — Other Ambulatory Visit: Payer: Self-pay

## 2023-05-12 ENCOUNTER — Emergency Department: Payer: 59

## 2023-05-12 DIAGNOSIS — O208 Other hemorrhage in early pregnancy: Secondary | ICD-10-CM | POA: Diagnosis not present

## 2023-05-12 DIAGNOSIS — Z3A01 Less than 8 weeks gestation of pregnancy: Secondary | ICD-10-CM | POA: Diagnosis not present

## 2023-05-12 DIAGNOSIS — O021 Missed abortion: Secondary | ICD-10-CM | POA: Insufficient documentation

## 2023-05-12 DIAGNOSIS — O26891 Other specified pregnancy related conditions, first trimester: Secondary | ICD-10-CM | POA: Diagnosis present

## 2023-05-12 LAB — CBC WITH DIFFERENTIAL/PLATELET
Abs Immature Granulocytes: 0.03 10*3/uL (ref 0.00–0.07)
Basophils Absolute: 0.1 10*3/uL (ref 0.0–0.1)
Basophils Relative: 1 %
Eosinophils Absolute: 0.1 10*3/uL (ref 0.0–0.5)
Eosinophils Relative: 1 %
HCT: 43.1 % (ref 36.0–46.0)
Hemoglobin: 14.6 g/dL (ref 12.0–15.0)
Immature Granulocytes: 0 %
Lymphocytes Relative: 35 %
Lymphs Abs: 3.1 10*3/uL (ref 0.7–4.0)
MCH: 32.7 pg (ref 26.0–34.0)
MCHC: 33.9 g/dL (ref 30.0–36.0)
MCV: 96.4 fL (ref 80.0–100.0)
Monocytes Absolute: 0.7 10*3/uL (ref 0.1–1.0)
Monocytes Relative: 7 %
Neutro Abs: 4.9 10*3/uL (ref 1.7–7.7)
Neutrophils Relative %: 56 %
Platelets: 265 10*3/uL (ref 150–400)
RBC: 4.47 MIL/uL (ref 3.87–5.11)
RDW: 12.3 % (ref 11.5–15.5)
WBC: 8.8 10*3/uL (ref 4.0–10.5)
nRBC: 0 % (ref 0.0–0.2)

## 2023-05-12 LAB — ABO/RH: ABO/RH(D): O POS

## 2023-05-12 LAB — HCG, QUANTITATIVE, PREGNANCY: hCG, Beta Chain, Quant, S: 20685 m[IU]/mL — ABNORMAL HIGH (ref <5)

## 2023-05-12 NOTE — ED Triage Notes (Signed)
Pt states 8.[redacted] weeks pregnant and is cramping and having bloody discharge.

## 2023-05-12 NOTE — Discharge Instructions (Signed)
Unfortunately your exam and ultrasound reveal that you have likely miscarried this current pregnancy.  You may follow-up with your OB provider on Monday for further management. We are so sorry for your loss.

## 2023-05-13 DIAGNOSIS — O021 Missed abortion: Secondary | ICD-10-CM | POA: Diagnosis not present

## 2023-05-13 DIAGNOSIS — Z888 Allergy status to other drugs, medicaments and biological substances status: Secondary | ICD-10-CM | POA: Diagnosis not present

## 2023-05-13 DIAGNOSIS — N96 Recurrent pregnancy loss: Secondary | ICD-10-CM | POA: Diagnosis not present

## 2023-05-13 DIAGNOSIS — Z79899 Other long term (current) drug therapy: Secondary | ICD-10-CM | POA: Diagnosis not present

## 2023-05-13 DIAGNOSIS — Z886 Allergy status to analgesic agent status: Secondary | ICD-10-CM | POA: Diagnosis not present

## 2023-05-14 ENCOUNTER — Inpatient Hospital Stay (HOSPITAL_COMMUNITY)
Admission: AD | Admit: 2023-05-14 | Discharge: 2023-05-14 | Disposition: A | Discharge status: Home / Self Care | Payer: 59 | Attending: Obstetrics and Gynecology | Admitting: Obstetrics and Gynecology

## 2023-05-14 ENCOUNTER — Other Ambulatory Visit: Payer: Self-pay

## 2023-05-14 ENCOUNTER — Telehealth: Payer: Self-pay | Admitting: Family Medicine

## 2023-05-14 ENCOUNTER — Emergency Department
Admission: EM | Admit: 2023-05-14 | Discharge: 2023-05-14 | Discharge status: Against Medical Advice | Payer: 59 | Attending: Emergency Medicine | Admitting: Emergency Medicine

## 2023-05-14 ENCOUNTER — Encounter (HOSPITAL_COMMUNITY): Payer: Self-pay | Admitting: Obstetrics and Gynecology

## 2023-05-14 DIAGNOSIS — R1084 Generalized abdominal pain: Secondary | ICD-10-CM | POA: Diagnosis not present

## 2023-05-14 DIAGNOSIS — O26891 Other specified pregnancy related conditions, first trimester: Secondary | ICD-10-CM | POA: Insufficient documentation

## 2023-05-14 DIAGNOSIS — Z3A08 8 weeks gestation of pregnancy: Secondary | ICD-10-CM | POA: Diagnosis not present

## 2023-05-14 DIAGNOSIS — R1030 Lower abdominal pain, unspecified: Secondary | ICD-10-CM | POA: Diagnosis not present

## 2023-05-14 DIAGNOSIS — O99351 Diseases of the nervous system complicating pregnancy, first trimester: Secondary | ICD-10-CM | POA: Insufficient documentation

## 2023-05-14 DIAGNOSIS — O021 Missed abortion: Secondary | ICD-10-CM | POA: Insufficient documentation

## 2023-05-14 DIAGNOSIS — Z5321 Procedure and treatment not carried out due to patient leaving prior to being seen by health care provider: Secondary | ICD-10-CM | POA: Insufficient documentation

## 2023-05-14 DIAGNOSIS — R109 Unspecified abdominal pain: Secondary | ICD-10-CM | POA: Insufficient documentation

## 2023-05-14 DIAGNOSIS — G40909 Epilepsy, unspecified, not intractable, without status epilepticus: Secondary | ICD-10-CM | POA: Insufficient documentation

## 2023-05-14 LAB — CBC WITH DIFFERENTIAL/PLATELET
Abs Immature Granulocytes: 0.01 10*3/uL (ref 0.00–0.07)
Basophils Absolute: 0 10*3/uL (ref 0.0–0.1)
Basophils Relative: 0 %
Eosinophils Absolute: 0.1 10*3/uL (ref 0.0–0.5)
Eosinophils Relative: 2 %
HCT: 39.6 % (ref 36.0–46.0)
Hemoglobin: 13.7 g/dL (ref 12.0–15.0)
Immature Granulocytes: 0 %
Lymphocytes Relative: 35 %
Lymphs Abs: 2.4 10*3/uL (ref 0.7–4.0)
MCH: 33.1 pg (ref 26.0–34.0)
MCHC: 34.6 g/dL (ref 30.0–36.0)
MCV: 95.7 fL (ref 80.0–100.0)
Monocytes Absolute: 0.6 10*3/uL (ref 0.1–1.0)
Monocytes Relative: 8 %
Neutro Abs: 3.7 10*3/uL (ref 1.7–7.7)
Neutrophils Relative %: 55 %
Platelets: 270 10*3/uL (ref 150–400)
RBC: 4.14 MIL/uL (ref 3.87–5.11)
RDW: 12.3 % (ref 11.5–15.5)
WBC: 6.8 10*3/uL (ref 4.0–10.5)
nRBC: 0 % (ref 0.0–0.2)

## 2023-05-14 LAB — COMPREHENSIVE METABOLIC PANEL
ALT: 21 U/L (ref 0–44)
AST: 22 U/L (ref 15–41)
Albumin: 4.3 g/dL (ref 3.5–5.0)
Alkaline Phosphatase: 64 U/L (ref 38–126)
Anion gap: 8 (ref 5–15)
BUN: 12 mg/dL (ref 6–20)
CO2: 23 mmol/L (ref 22–32)
Calcium: 9.4 mg/dL (ref 8.9–10.3)
Chloride: 105 mmol/L (ref 98–111)
Creatinine, Ser: 0.5 mg/dL (ref 0.44–1.00)
GFR, Estimated: >60 mL/min (ref >60)
Glucose, Bld: 133 mg/dL — ABNORMAL HIGH (ref 70–99)
Potassium: 3.6 mmol/L (ref 3.5–5.1)
Sodium: 136 mmol/L (ref 135–145)
Total Bilirubin: 0.5 mg/dL (ref <1.2)
Total Protein: 6.9 g/dL (ref 6.5–8.1)

## 2023-05-14 MED ORDER — CYCLOBENZAPRINE HCL 5 MG PO TABS
5.0000 mg | ORAL_TABLET | Freq: Once | ORAL | Status: AC
  Administered 2023-05-14: 5 mg via ORAL
  Filled 2023-05-14: qty 1

## 2023-05-14 MED ORDER — OXYCODONE HCL 5 MG PO TABS
10.0000 mg | ORAL_TABLET | Freq: Once | ORAL | Status: AC
  Administered 2023-05-14: 10 mg via ORAL
  Filled 2023-05-14: qty 2

## 2023-05-14 MED ORDER — OXYCODONE HCL 5 MG PO TABS: 5.0000 mg | ORAL_TABLET | Freq: Four times a day (QID) | ORAL | 0 refills | Status: DC | PRN

## 2023-05-14 MED ORDER — CYCLOBENZAPRINE HCL 10 MG PO TABS: 10.0000 mg | ORAL_TABLET | Freq: Three times a day (TID) | ORAL | 0 refills | Status: DC | PRN

## 2023-05-14 MED ORDER — CYCLOBENZAPRINE HCL 5 MG PO TABS: 5.0000 mg | ORAL_TABLET | Freq: Three times a day (TID) | ORAL | 0 refills | Status: DC | PRN

## 2023-05-14 NOTE — Telephone Encounter (Signed)
Patient called stating she was seen in the ER over the weekend and found out that she miscarried, she was told to call her OB today so that she can get a D&C done. I let patient know that I will send the nurses a message and someone will reach out to her to get scheduled.

## 2023-05-14 NOTE — ED Triage Notes (Signed)
Pt to ED via [facility] c/o lower abd pain that started today. Pt reports she is having a miscarriage and was supposed to go to [facility] for [facility] but could not afford it. Pt not having any bleeding. Started off as cramping yesterday but now is sharp, shooting pains. Pt has taken tylenol at home with no relief. Pt has hx of seixures

## 2023-05-14 NOTE — MAU Provider Note (Signed)
Chief Complaint: Abdominal Pain   Event Date/Time   First Provider Initiated Contact with Patient 05/14/23 2245      SUBJECTIVE HPI: Pamela Booker is a 27 y.o. [id number] at [redacted]w[redacted]d by early ultrasound who presents to maternity admissions reporting abdominal pain. Pregnancy c/b missed abortion, seizures, endometriosis.  Patient was diagnosed with missed abortion on 12/7. She has not had any vaginal bleeding, LOF, fever/chills. She did start to develop sharp pains in her abdomen that start at the top and shoot down to her pelvis today. Happens about every 15 minutes. Tylenol largely unhelpful.  Patient has restarted Trileptal after confirmed miscarriage. Has diazepam at home. No seizures at this time.  HPI  Past Medical History:  Diagnosis Date   Anxiety    Bilateral nephrolithiasis    Depression    Endometriosis    Epilepsy (HCC)    GERD (gastroesophageal reflux disease)    History of kidney stones    HSV-2 infection    Hypertension    Migraines    Nausea & vomiting 11/23/2022   Ovarian cyst    Palpitations    PVC (premature ventricular contraction)    Seizures (HCC)    last seizure was 2 weeks ago (app.12-18-22)   Status post placement of VNS (vagus nerve stimulation) device    Syncope and collapse 06/10/2019   Thyroid nodule    Past Surgical History:  Procedure Laterality Date   CYSTOSCOPY W/ URETERAL STENT REMOVAL Left 01/09/2023   Procedure: CYSTOSCOPY WITH STENT REMOVAL;  Surgeon: Riki Altes, MD;  Location: [facility];  Service: Urology;  Laterality: Left;   CYSTOSCOPY/URETEROSCOPY/HOLMIUM LASER/STENT PLACEMENT Left 01/02/2023   Procedure: CYSTOSCOPY/URETEROSCOPY/HOLMIUM LASER/STENT PLACEMENT;  Surgeon: Riki Altes, MD;  Location: [facility];  Service: Urology;  Laterality: Left;   CYSTOSCOPY/URETEROSCOPY/HOLMIUM LASER/STENT PLACEMENT Right 01/09/2023   Procedure: CYSTOSCOPY/URETEROSCOPY/HOLMIUM LASER/STENT PLACEMENT;  Surgeon: Riki Altes, MD;  Location:  [facility];  Service: Urology;  Laterality: Right;   ENDOMETRIAL ABLATION  06/2022   [facility]   LAPAROSCOPY     x5 for endometriosis   LOOP RECORDER INSERTION  2020   LOOP RECORDER REMOVAL  2021   TONSILLECTOMY     URETEROSCOPY Left 01/09/2023   Procedure: URETEROSCOPY;  Surgeon: Riki Altes, MD;  Location: [facility];  Service: Urology;  Laterality: Left;   VAGUS NERVE STIMULATOR INSERTION  2022   Social History   Socioeconomic History   Marital status: Single    Spouse name: Not on file   Number of children: 1   Years of education: Not on file   Highest education level: Some college, no degree  Occupational History   Not on file  Tobacco Use   Smoking status: Never   Smokeless tobacco: Never  Vaping Use   Vaping status: Former   Substances: Nicotine  Substance and Sexual Activity   Alcohol use: Not Currently    Comment: socially   Drug use: Not Currently    Comment: THC   Sexual activity: Yes  Other Topics Concern   Not on file  Social History Narrative   Pt lives alone with daughter in 2 story home   Right handed   Pt drinks coffee everyday, sometime tea at night, soda-coke zero not often   exercise 3-4 times a week          Social Determinants of Health   Financial Resource Strain: Not on file  Food Insecurity: No Food Insecurity (05/09/2023)   Hunger Vital Sign    Worried  About Running Out of Food in the Last Year: Never true    Ran Out of Food in the Last Year: Never true  Transportation Needs: No Transportation Needs (05/09/2023)   PRAPARE - Administrator, Civil Service (Medical): No    Lack of Transportation (Non-Medical): No  Physical Activity: Not on file  Stress: Not on file  Social Connections: Not on file  Intimate Partner Violence: Not on file   No current facility-administered medications on file prior to encounter.   Current Outpatient Medications on File Prior to Encounter  Medication Sig Dispense Refill   acetaminophen (TYLENOL)  325 MG tablet Take 650 mg by mouth every 6 (six) hours as needed.     escitalopram (LEXAPRO) 10 MG tablet Take by mouth.     famotidine (PEPCID) 20 MG tablet Take 1 tablet (20 mg total) by mouth 2 (two) times daily. 60 tablet 3   Ferrous Sulfate (IRON PO) Take 1 tablet by mouth daily.     OXcarbazepine (TRILEPTAL) 150 MG tablet Take 1 tablet (150 mg total) by mouth daily. 30 tablet 5   valACYclovir (VALTREX) 500 MG tablet Take 500 mg by mouth daily as needed.     aspirin EC 81 MG tablet Take 1 tablet (81 mg total) by mouth daily. Take after 12 weeks for prevention of preeclampsia later in pregnancy 300 tablet 2   diazepam (DIASTAT ACUDIAL) 10 MG GEL Place 10 mg rectally once for 1 dose. 1 each 2   diazepam (VALIUM) 10 MG tablet Take 1 tablet (10 mg total) by mouth every 6 (six) hours as needed (Seizure/aura). 30 tablet 0   Allergies  Allergen Reactions   Nsaids Other (See Comments)    History of bleeding ulcers   Ativan [Lorazepam] Itching   Compazine [Prochlorperazine]    Diphenhydramine Other (See Comments)    "Crawling feeling under skin"   Ibuprofen     Other reaction(s): GI Upset (intolerance)   Keppra [Levetiracetam] Other (See Comments)    "Makes her feel hungover"    Zolpidem Other (See Comments)    seizure   Emgality [Galcanezumab-Gnlm] Rash    ROS:  Pertinent positives/negatives listed above.  I have reviewed patient's Past Medical Hx, Surgical Hx, Family Hx, Social Hx, medications and allergies.   Physical Exam  Patient Vitals for the past 24 hrs:  BP Temp Temp src Pulse Resp Height Weight  05/14/23 2237 117/64 97.6 F (36.4 C) Oral 85 14 5\' 2"  (1.575 m) 66.2 kg   Constitutional: Well-developed, well-nourished female in no acute distress  Cardiovascular: normal rate, regular rhythm Respiratory: normal effort GI: Abd soft, non-tender MS: Extremities nontender, no edema, normal ROM Neurologic: Alert and oriented x 4  LAB RESULTS Results for orders placed or  performed during the hospital encounter of 05/14/23 (from the past 24 hour(s))  Comprehensive metabolic panel     Status: Abnormal   Collection Time: 05/14/23  8:06 PM  Result Value Ref Range   Sodium 136 135 - 145 mmol/L   Potassium 3.6 3.5 - 5.1 mmol/L   Chloride 105 98 - 111 mmol/L   CO2 23 22 - 32 mmol/L   Glucose, Bld 133 (H) 70 - 99 mg/dL   BUN 12 6 - 20 mg/dL   Creatinine, Ser 1.61 0.44 - 1.00 mg/dL   Calcium 9.4 8.9 - 09.6 mg/dL   Total Protein 6.9 6.5 - 8.1 g/dL   Albumin 4.3 3.5 - 5.0 g/dL   AST 22 15 - 41  U/L   ALT 21 0 - 44 U/L   Alkaline Phosphatase 64 38 - 126 U/L   Total Bilirubin 0.5 <1.2 mg/dL   GFR, Estimated >45 >40 mL/min   Anion gap 8 5 - 15  CBC with Differential     Status: None   Collection Time: 05/14/23  8:06 PM  Result Value Ref Range   WBC 6.8 4.0 - 10.5 K/uL   RBC 4.14 3.87 - 5.11 MIL/uL   Hemoglobin 13.7 12.0 - 15.0 g/dL   HCT 98.1 19.1 - 47.8 %   MCV 95.7 80.0 - 100.0 fL   MCH 33.1 26.0 - 34.0 pg   MCHC 34.6 30.0 - 36.0 g/dL   RDW 29.5 62.1 - 30.8 %   Platelets 270 150 - 400 K/uL   nRBC 0.0 0.0 - 0.2 %   Neutrophils Relative % 55 %   Neutro Abs 3.7 1.7 - 7.7 K/uL   Lymphocytes Relative 35 %   Lymphs Abs 2.4 0.7 - 4.0 K/uL   Monocytes Relative 8 %   Monocytes Absolute 0.6 0.1 - 1.0 K/uL   Eosinophils Relative 2 %   Eosinophils Absolute 0.1 0.0 - 0.5 K/uL   Basophils Relative 0 %   Basophils Absolute 0.0 0.0 - 0.1 K/uL   Immature Granulocytes 0 %   Abs Immature Granulocytes 0.01 0.00 - 0.07 K/uL    --/--/O POS Performed at [facility], [address] [place], [place] [address]  (12/07 1845)  IMAGING US OB LESS THAN 14 WEEKS WITH OB TRANSVAGINAL  Result Date: 05/12/2023 CLINICAL DATA:  Vaginal bleeding, pregnancy EXAM: OBSTETRIC <14 WK [place] AND TRANSVAGINAL OB US TECHNIQUE: Both transabdominal and transvaginal ultrasound examinations were performed for complete evaluation of the gestation as well as the maternal uterus,  adnexal regions, and pelvic cul-de-sac. Transvaginal technique was performed to assess early pregnancy. COMPARISON:  None Available. FINDINGS: Intrauterine gestational sac: Single Yolk sac:  Visualized. Embryo:  Visualized. Cardiac Activity: Not Visualized. Heart Rate: Not visualized. CRL:  4.2 mm   6 w   1 d                  [place] EDC: Subchorionic hemorrhage:  Small anterior subchorionic hemorrhage. Maternal uterus/adnexae: Unremarkable. IMPRESSION: Single intrauterine gestation at sonographic gestational age of [redacted] weeks, 1 day. Unfortunately, no fetal cardiac activity is identified on today's examination. By patient report, a previous ultrasound examination dated 04/27/2023 identified a [redacted] week gestation with fetal cardiac activity, and therefore findings of today's examination are consistent with a failed early pregnancy. Electronically Signed   By: Jearld Lesch M.D.   On: 05/12/2023 19:36    MAU Management/MDM: Orders Placed This Encounter  Procedures   Discharge patient    Meds ordered this encounter  Medications   oxyCODONE (Oxy IR/ROXICODONE) immediate release tablet 10 mg   oxyCODONE (OXY IR/ROXICODONE) 5 MG immediate release tablet    Sig: Take 1 tablet (5 mg total) by mouth every 6 (six) hours as needed for severe pain (pain score 7-10).    Dispense:  30 tablet    Refill:  0   cyclobenzaprine (FLEXERIL) tablet 5 mg   cyclobenzaprine (FLEXERIL) 10 MG tablet    Sig: Take 1 tablet (10 mg total) by mouth 3 (three) times daily as needed.    Dispense:  30 tablet    Refill:  0   DISCONTD: cyclobenzaprine (FLEXERIL) 5 MG tablet    Sig: Take 1 tablet (5 mg total) by mouth 3 (  three) times daily as needed for muscle spasms.    Dispense:  30 tablet    Refill:  0    Patient presents with cramping with known missed AB. At this time, she does not have any vaginal bleeding, passage of clots/tissue. Discussed options for management, including expectant, cytotec, and D&E. She elects for D&E. Discussed  that as she is not bleeding heavily, fevering, or showing any signs of instability, a D&E is not emergent at this time. We will manage pain with oxycodone, flexeril, tylenol. (Cannot tolerate NSAIDs.) Advised not to drive while taking these medications and to stagger oxycodone and flexeril. I have sent an urgent message to OR scheduling to get procedure scheduled as soon as possible. Confirmed that patient has a supply of her seizure prophylaxis and emergency medications at home.  ASSESSMENT 1. Missed abortion   2. Nonintractable epilepsy without status epilepticus, unspecified epilepsy type (HCC)     PLAN Discharge home with strict return precautions including fever, heavy bleeding, worsening abdominal pain. Allergies as of 05/14/2023       Reactions   Nsaids Other (See Comments)   History of bleeding ulcers   Ativan [lorazepam] Itching   Compazine [prochlorperazine]    Diphenhydramine Other (See Comments)   "Crawling feeling under skin"   Ibuprofen    Other reaction(s): GI Upset (intolerance)   Keppra [levetiracetam] Other (See Comments)   "Makes her feel hungover"    Zolpidem Other (See Comments)   seizure   Emgality [galcanezumab-gnlm] Rash        Medication List     TAKE these medications    acetaminophen 325 MG tablet Commonly known as: TYLENOL Take 650 mg by mouth every 6 (six) hours as needed.   aspirin EC 81 MG tablet Take 1 tablet (81 mg total) by mouth daily. Take after 12 weeks for prevention of preeclampsia later in pregnancy   cyclobenzaprine 10 MG tablet Commonly known as: FLEXERIL Take 1 tablet (10 mg total) by mouth 3 (three) times daily as needed.   diazepam 10 MG Gel Commonly known as: DIASTAT ACUDIAL Place 10 mg rectally once for 1 dose.   diazepam 10 MG tablet Commonly known as: Valium Take 1 tablet (10 mg total) by mouth every 6 (six) hours as needed (Seizure/aura).   escitalopram 10 MG tablet Commonly known as: LEXAPRO Take by mouth.    famotidine 20 MG tablet Commonly known as: PEPCID Take 1 tablet (20 mg total) by mouth 2 (two) times daily.   IRON PO Take 1 tablet by mouth daily.   OXcarbazepine 150 MG tablet Commonly known as: Trileptal Take 1 tablet (150 mg total) by mouth daily.   oxyCODONE 5 MG immediate release tablet Commonly known as: Oxy IR/ROXICODONE Take 1 tablet (5 mg total) by mouth every 6 (six) hours as needed for severe pain (pain score 7-10).   valACYclovir 500 MG tablet Commonly known as: VALTREX Take 500 mg by mouth daily as needed.         Wylene Simmer, MD OB Fellow 05/14/2023  11:46 PM

## 2023-05-14 NOTE — Telephone Encounter (Signed)
Patient and her Mother Pamela Booker has been calling and also came to the office today, Pamela Booker was told that she need a D&C,  patient and her mother are understandably anxious and looking for timely assistance.  I spoke to Pamela Booker has committed to calling the patient.

## 2023-05-14 NOTE — Telephone Encounter (Signed)
Called pt after pt's mother came to office this morning stating that the pt needs a D&C.  The patient is heard crying stating that she was seen at [facility] in which she was scheduled to do a D&C there then pt found out that they do not take her insurance.  I explained to the pt that she would need to be seen by a surgeon and the soonest appt is 05/16/23 at 0955.  Pt stated if there is anything sooner.  I explained to the pt that it is the soonest appt with a surgeon and that I could not say when her D&C would be scheduled.   I explained to the pt to try to relax as we will be able to provide her with the best care.  I advised pt when to go to MAU for evaluation.  Pt verbalized understanding with no further questions.      1445- I return pt's mother call.  The mother is stating that she is concerned about her daughter having another seizure due to the stress of everything that is going on.  I explained to the mother that due to HIPAA I can not speak about her daughter but to know that she has been scheduled with a surgeon and that her daughter has agreed to the plan.  I encouraged mother to help daughter through her grief and that we will provide the best care.  Pt's mother stated thank you with no further questions.   Leonette Nutting

## 2023-05-14 NOTE — Telephone Encounter (Signed)
Ms.Norman called wanting to speak to [place]. After telling her that Erie Noe was in a meeting she told me that her daughter was scheduled for this Wednesday and wanted to know if we had anything sooner. I explained that we currently have no availability this week so 12/11 is the soonest she could be seen. The mom is now frustrated and says that Addison Naegeli was supposed to call her back in regards to this situation but instead she called the patient. She insists that I have Jeanetta call her back since she is in the medical field and because the patient Lulamae is epileptic and does not need to stress about this situation. I told her I would relay the message to [place]. She says she is not wanting to complain and we have been wonderful with handling this situation but it is very important that the [facility] is done as soon as possible because Meenu is walking around with a "dead baby" in her and she is also supposed to be getting married this Saturday. Mom was okay after I told her I was going to speak to [place].

## 2023-05-14 NOTE — ED Triage Notes (Signed)
EMS brings pt in from home for reports of recent dx missed abortion at 6wks, was unable to have a D&C at [facility] due to insurance; c/o pain but no vag bleeding

## 2023-05-14 NOTE — MAU Note (Signed)
Pt says was at [facility] ER on Sat- for cramping. Did U/S- told baby is 6 weeks 1 day- told her No FHR - did not trust them. So yesterday went to [facility] - told same. Today [facility]- set up appointment for tomorrow - but they are out of network. [facility]- Dr Alvester Morin- clinic.  Pt called clinic today.  Now- sharp shooting pain in lower abd . 0/10 Denies VB.

## 2023-05-14 NOTE — Telephone Encounter (Signed)
Patient has called back, requesting a schedule to get D&C, patient is really worried.

## 2023-05-14 NOTE — ED Provider Notes (Signed)
[facility] Emergency Department Provider Note     Event Date/Time   First MD Initiated Contact with Patient 05/12/23 2134     (approximate)   History   Cramping in pregnancy   HPI  Pamela Booker is a 27 y.o. female G4P1 with an estimated gestational age of 8.5 weeks based on LMP, presents to the ED for evaluation of pelvic cramping and a scant bloody discharge.  Patient present to the ED evaluated for complaints, and reports of cramps today patient denies any associated nausea or vomiting, or dysuria.  She was transferred from [facility] to [facility] for high risk pregnancy.  She would endorse that her initial ultrasound revealed a fetal heart rate of only 95 bpm.  Physical Exam   Triage Vital Signs: ED Triage Vitals  Encounter Vitals Group     BP 05/12/23 1840 (!) 142/83     Systolic BP Percentile --      Diastolic BP Percentile --      Pulse Rate 05/12/23 1840 95     Resp 05/12/23 1840 18     Temp 05/12/23 1844 98.2 F (36.8 C)     Temp Source 05/12/23 1844 Oral     SpO2 05/12/23 1838 100 %     Weight 05/12/23 1840 140 lb (63.5 kg)     Height 05/12/23 1840 5\' 2"  (1.575 m)     Head Circumference --      Peak Flow --      Pain Score 05/12/23 2045 5     Pain Loc --      Pain Education --      Exclude from Growth Chart --     Most recent vital signs: Vitals:   05/12/23 2130 05/12/23 2300  BP: 119/68 134/71  Pulse: 75 79  Resp:    Temp:    SpO2: 100% 100%    General Awake, no distress.  CV:  Good peripheral perfusion.  RESP:  Normal effort.  ABD:  No distention.  GU:  deferred   ED Results / Procedures / Treatments   Labs (all labs ordered are listed, but only abnormal results are displayed) Labs Reviewed  HCG, QUANTITATIVE, PREGNANCY - Abnormal; Notable for the following components:      Result Value   hCG, Beta Chain, [place] Longest 20,685 (*)    All other components within normal limits  CBC WITH DIFFERENTIAL/PLATELET   ABO/RH     EKG   RADIOLOGY  I personally viewed and evaluated these images as part of my medical decision making, as well as reviewing the written report by the radiologist.  ED Provider Interpretation: Intrauterine fetal demise  US OB Less Than 14 weeks  IMPRESSION: Single intrauterine gestation at sonographic gestational age of [redacted] weeks, 1 day. Unfortunately, no fetal cardiac activity is identified on today's examination. By patient report, a previous ultrasound examination dated 04/27/2023 identified a [redacted] week gestation with fetal cardiac activity, and therefore findings of today's examination are consistent with a failed early pregnancy.     Electronically Signed   By: Jearld Lesch M.D.   On: 05/12/2023 19:36  PROCEDURES:  Critical Care performed: No  Procedures   MEDICATIONS ORDERED IN ED: Medications - No data to display   IMPRESSION / MDM / ASSESSMENT AND PLAN / ED COURSE  I reviewed the triage vital signs and the nursing notes.  Differential diagnosis includes, but is not limited to, threatened miscarriage, incomplete miscarriage, normal bleeding from an early trimester pregnancy, ectopic pregnancy, , blighted ovum, vaginal/cervical trauma, subchorionic hemorrhage/hematoma, etc.  Patient's presentation is most consistent with acute complicated illness / injury requiring diagnostic workup.  Patient's diagnosis is consistent with missed AB.  Patient presents to the ED for evaluation of cramping with expected gestational age of 8.5 weeks based on LMP.  Patient was found on exam to have a beta quant that is greater than 20,000.  Ultrasound however, did not indicate evidence of viable fetus. Patient is to follow up with her OB at [facility] as discussed, as needed or otherwise directed. Patient is given ED precautions to return to the ED for any worsening or new symptoms.     FINAL CLINICAL IMPRESSION(S) / ED DIAGNOSES   Final  diagnoses:  Missed abortion with fetal demise before 20 completed weeks of gestation     Rx / DC Orders   ED Discharge Orders     None        Note:  This document was prepared using Dragon voice recognition software and may include unintentional dictation errors.    Lissa Hoard, PA-C 05/14/23 0009    Sharman Cheek, MD 05/14/23 [phone]

## 2023-05-14 NOTE — Discharge Instructions (Addendum)
Number to call about scheduling: [phone]  Please do not take flexeril or oxycodone if you are planning to drive. Additionally please stagger these medications.

## 2023-05-15 ENCOUNTER — Encounter (HOSPITAL_COMMUNITY): Payer: Self-pay | Admitting: Obstetrics and Gynecology

## 2023-05-15 NOTE — Progress Notes (Signed)
SDW CALL  Patient was given pre-op instructions over the phone. The opportunity was given for the patient to ask questions. No further questions asked. Patient verbalized understanding of instructions given.   PCP - pt currently does not have a PCP Cardiologist - denies Neurologist - pt states that she currently does not have a neurologist  PPM/ICD - denies  Vagal nerve stimulator - pt states she does not have a way to turn it off/on - has used a magnet in the past  Patient states last seizure was 05/14/23.   Chest x-ray - denies EKG - 12/08/22 Stress Test - denies ECHO - 2020 Cardiac Cath -  denies   Sleep Study - denies CPAP - n/a  Fasting Blood Sugar - n/a   Blood Thinner Instructions: n/a Aspirin Instructions: n/a  ERAS Protcol - clears until 1030  COVID TEST- n/a   Anesthesia review: yes - seizures, HTN  Patient denies shortness of breath, fever, cough and chest pain over the phone call   All instructions explained to the patient, with a verbal understanding of the material. Patient agrees to go over the instructions while at home for a better understanding.

## 2023-05-16 ENCOUNTER — Other Ambulatory Visit: Payer: Self-pay | Admitting: Obstetrics and Gynecology

## 2023-05-16 ENCOUNTER — Ambulatory Visit: Payer: 59 | Admitting: Obstetrics and Gynecology

## 2023-05-16 NOTE — Anesthesia Preprocedure Evaluation (Signed)
Anesthesia Evaluation  Patient identified by MRN, date of birth, ID band Patient awake    Reviewed: Allergy & Precautions, NPO status , Patient's Chart, lab work & pertinent test results  Airway Mallampati: I  TM Distance: >3 FB Neck ROM: Full    Dental  (+) Teeth Intact, Dental Advisory Given   Pulmonary neg pulmonary ROS   Pulmonary exam normal breath sounds clear to auscultation       Cardiovascular Normal cardiovascular exam+ dysrhythmias (hx PVCs)  Rhythm:Regular Rate:Normal   Loop recorder 05/06/19-12/12/19 to evaluate for syncope   Neuro/Psych  Headaches, Seizures -, Well Controlled,  PSYCHIATRIC DISORDERS Anxiety Depression    epilepsy (epileptic & non-epileptic events, s/p VNS, 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 The excessive beta activity seen in the background is most likely due to the effect of benzodiazepine and is a benign EEG pattern. No seizures or epileptiform discharges were seen throughout the recording. - Two events were recorded on 07/05/2021 at 1453 and 2218 as described above without concomitant EEG change.  These were NON-epileptic events.     GI/Hepatic Neg liver ROS,GERD  Controlled,,  Endo/Other  negative endocrine ROS    Renal/GU negative Renal ROS  negative genitourinary   Musculoskeletal negative musculoskeletal ROS (+)    Abdominal   Peds  Hematology negative hematology ROS (+) Hb 13.7   Anesthesia Other Findings   Reproductive/Obstetrics Missed ab 8 4/7                             Anesthesia Physical Anesthesia Plan  ASA: 2  Anesthesia Plan: General   Post-op Pain Management: Tylenol PO (pre-op)* and Precedex   Induction: Intravenous  PONV Risk Score and Plan: 4 or greater and Ondansetron, Dexamethasone, Midazolam and Treatment may vary due to age or medical condition  Airway  Management Planned: LMA  Additional Equipment: None  Intra-op Plan:   Post-operative Plan: Extubation in OR  Informed Consent: I have reviewed the patients History and Physical, chart, labs and discussed the procedure including the risks, benefits and alternatives for the proposed anesthesia with the patient or authorized representative who has indicated his/her understanding and acceptance.     Dental advisory given  Plan Discussed with: CRNA  Anesthesia Plan Comments: (  )       Anesthesia Quick Evaluation

## 2023-05-16 NOTE — Progress Notes (Signed)
Anesthesia Chart Review: Pamela Booker  Case: [id number] Date/Time: 05/17/23 1257   Procedure: DILATATION AND EVACUATION   Anesthesia type: Choice   Pre-op diagnosis: Missed abortion   [facility]: [facility] ROOM 18 / [facility]   Surgeons: [place] Bing, MD       DISCUSSION: Patient is a 27 year old female scheduled for the above procedure. She was diagnosed with missed abortion on 05/12/23. She restarted Trileptal after confirmed miscarriage.  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   Anesthesia team to evaluate on the day of surgery.  She told RN staff that she does not have a way to turn her VNS on/off, so a magnet has been used in the past. Last VNS revision was by Dr. Mack Guise. She says she does not currently have a neurologist.    VS: LMP 03/02/2023 (Exact Date)  Wt Readings from Last 3 Encounters:  05/14/23 66.2 kg  05/14/23 63.5 kg  05/12/23 63.5 kg   BP Readings from Last 3 Encounters:  05/14/23 117/64  05/14/23 131/76  05/12/23 134/71   Pulse Readings from Last 3 Encounters:  05/14/23 85  05/14/23 (!) 103  05/12/23 79     PROVIDERS: Patient, No Pcp Per Lucy Antigua, MD is neurosurgeon - Previous cardiology evaluation by Yates Decamp, MD for palpitations, PVC, syncope/near syncope. Had loop recorder 05/06/19-12/12/19 which showed no significant arrhythmias. Neurology  to evaluate for syncope, diagnosed with complex migraines   LABS: For day of surgery as indicated. Last results in [facility] include: Lab Results  Component Value Date   WBC 6.8 05/14/2023   HGB 13.7 05/14/2023   HCT  39.6 05/14/2023   PLT 270 05/14/2023   GLUCOSE 133 (H) 05/14/2023   ALT 21 05/14/2023   AST 22 05/14/2023   NA 136 05/14/2023   K 3.6 05/14/2023   CL 105 05/14/2023   CREATININE 0.50 05/14/2023   BUN 12 05/14/2023   CO2 23 05/14/2023   HGBA1C 4.9 05/09/2023    OTHER: Overnight EEG with video 07/05/21: IMPRESSION: - This study is within normal limits. The excessive beta activity seen in the background is most likely due to the effect of benzodiazepine and is a benign EEG pattern. No seizures or epileptiform discharges were seen throughout the recording. - Two events were recorded on 07/05/2021 at 1453 and 2218 as described above without concomitant EEG change.  These were NON-epileptic events.   IMAGES: CT Renal Stone 12/28/22: IMPRESSION: Bilateral nonobstructive nephrolithiasis, similar compared to Oct 30, 2022. No hydronephrosis or ureteral/bladder calculi.  CT Head 01/29/22: IMPRESSION: 1. No acute intracranial abnormalities. The appearance of the brain is normal.    EKG: 12/08/22: Sinus tachycardia at 107 bpm Borderline Q waves in lateral leads Baseline wander in lead(s) V2 V4 Confirmed by UNCONFIRMED, DOCTOR ([id number]), editor Lonell Face [phone]) on 12/11/2022 8:54:22 AM  CV: Holter Monitor on 11/12/18 for 48 hours:  Normal sinus rhythm. Occasional PVC's, occasional ventricular bigeminy (6.2%). No SVT or A fib. No reported  symptoms.  Echocardiogram 10/30/2018: Normal LV systolic function with EF 55%. Left ventricle cavity is normal in size. Normal left ventricular wall thickness. Normal global wall motion. Normal diastolic filling pattern. Calculated EF 55%. Left atrial  cavity is normal in size. Intact interatrial septum. No shunting seen on agitated saline study. No significant valvular abnormalities.   Past Medical History:  Diagnosis Date   Anxiety    Bilateral nephrolithiasis    Depression    Endometriosis    Epilepsy (HCC)    GERD (gastroesophageal reflux  disease)    History of kidney stones    HSV-2 infection    Hypertension    Inactive tuberculosis    Migraines    Nausea & vomiting 11/23/2022   Ovarian cyst    Palpitations    PVC (premature ventricular contraction)    Seizures (HCC)    last seizure was 2 weeks ago (app.12-18-22)   Status post placement of VNS (vagus nerve stimulation) device    Syncope and collapse 06/10/2019   Thyroid nodule     Past Surgical History:  Procedure Laterality Date   CYSTOSCOPY W/ URETERAL STENT REMOVAL Left 01/09/2023   Procedure: CYSTOSCOPY WITH STENT REMOVAL;  Surgeon: Riki Altes, MD;  Location: [facility] ORS;  Service: Urology;  Laterality: Left;   CYSTOSCOPY/URETEROSCOPY/HOLMIUM LASER/STENT PLACEMENT Left 01/02/2023   Procedure: CYSTOSCOPY/URETEROSCOPY/HOLMIUM LASER/STENT PLACEMENT;  Surgeon: Riki Altes, MD;  Location: [facility] ORS;  Service: Urology;  Laterality: Left;   CYSTOSCOPY/URETEROSCOPY/HOLMIUM LASER/STENT PLACEMENT Right 01/09/2023   Procedure: CYSTOSCOPY/URETEROSCOPY/HOLMIUM LASER/STENT PLACEMENT;  Surgeon: Riki Altes, MD;  Location: [facility] ORS;  Service: Urology;  Laterality: Right;   ENDOMETRIAL ABLATION  06/2022   [facility]   LAPAROSCOPY     x5 for endometriosis   LOOP RECORDER INSERTION  2020   LOOP RECORDER REMOVAL  2021   TONSILLECTOMY     URETEROSCOPY Left 01/09/2023   Procedure: URETEROSCOPY;  Surgeon: Riki Altes, MD;  Location: [facility] ORS;  Service: Urology;  Laterality: Left;   VAGUS NERVE STIMULATOR INSERTION  2022    MEDICATIONS: No current facility-administered medications for this encounter.    acetaminophen (TYLENOL) 325 MG tablet   cyclobenzaprine (FLEXERIL) 10 MG tablet   diazepam (VALIUM) 10 MG tablet   escitalopram (LEXAPRO) 10 MG tablet   OXcarbazepine (TRILEPTAL) 150 MG tablet   oxyCODONE (OXY IR/ROXICODONE) 5 MG immediate release tablet   valACYclovir (VALTREX) 500 MG tablet   aspirin EC 81 MG tablet   diazepam (DIASTAT ACUDIAL) 10 MG GEL    famotidine (PEPCID) 20 MG tablet    Shonna Chock, PA-C Surgical Short Stay/Anesthesiology [facility] Phone [phone] [facility] Phone [phone] 05/16/2023 10:49 AM

## 2023-05-17 ENCOUNTER — Other Ambulatory Visit: Payer: Self-pay

## 2023-05-17 ENCOUNTER — Encounter (HOSPITAL_COMMUNITY)
Admission: RE | Disposition: A | Discharge status: Home / Self Care | Payer: Self-pay | Source: Home / Self Care | Attending: Obstetrics and Gynecology

## 2023-05-17 ENCOUNTER — Ambulatory Visit (HOSPITAL_BASED_OUTPATIENT_CLINIC_OR_DEPARTMENT_OTHER): Payer: 59 | Admitting: Vascular Surgery

## 2023-05-17 ENCOUNTER — Encounter (HOSPITAL_COMMUNITY): Payer: Self-pay | Admitting: Obstetrics and Gynecology

## 2023-05-17 ENCOUNTER — Ambulatory Visit (HOSPITAL_COMMUNITY)
Admission: RE | Admit: 2023-05-17 | Discharge: 2023-05-17 | Disposition: A | Discharge status: Home / Self Care | Payer: 59 | Attending: Obstetrics and Gynecology | Admitting: Obstetrics and Gynecology

## 2023-05-17 ENCOUNTER — Ambulatory Visit (HOSPITAL_COMMUNITY): Payer: Self-pay | Admitting: Vascular Surgery

## 2023-05-17 DIAGNOSIS — G40909 Epilepsy, unspecified, not intractable, without status epilepticus: Secondary | ICD-10-CM | POA: Insufficient documentation

## 2023-05-17 DIAGNOSIS — O021 Missed abortion: Secondary | ICD-10-CM

## 2023-05-17 DIAGNOSIS — Z9889 Other specified postprocedural states: Secondary | ICD-10-CM

## 2023-05-17 DIAGNOSIS — Z3A09 9 weeks gestation of pregnancy: Secondary | ICD-10-CM

## 2023-05-17 DIAGNOSIS — Z3A08 8 weeks gestation of pregnancy: Secondary | ICD-10-CM

## 2023-05-17 HISTORY — PX: DILATION AND EVACUATION: SHX1459

## 2023-05-17 HISTORY — DX: Latent tuberculosis: Z22.7

## 2023-05-17 LAB — TYPE AND SCREEN
ABO/RH(D): O POS
Antibody Screen: NEGATIVE

## 2023-05-17 SURGERY — DILATION AND EVACUATION, UTERUS
Anesthesia: General | Site: Vagina

## 2023-05-17 MED ORDER — FENTANYL CITRATE (PF) 250 MCG/5ML IJ SOLN
INTRAMUSCULAR | Status: AC
  Filled 2023-05-17: qty 5

## 2023-05-17 MED ORDER — ORAL CARE MOUTH RINSE: 15.0000 mL | Freq: Once | OROMUCOSAL | Status: AC

## 2023-05-17 MED ORDER — PROPOFOL 10 MG/ML IV BOLUS
INTRAVENOUS | Status: DC | PRN
  Administered 2023-05-17: 200 mg via INTRAVENOUS

## 2023-05-17 MED ORDER — ACETAMINOPHEN 500 MG PO TABS
1000.0000 mg | ORAL_TABLET | Freq: Once | ORAL | Status: AC
  Administered 2023-05-17: 1000 mg via ORAL
  Filled 2023-05-17: qty 2

## 2023-05-17 MED ORDER — DOXYCYCLINE HYCLATE 100 MG IV SOLR
200.0000 mg | Freq: Once | INTRAVENOUS | Status: AC
  Administered 2023-05-17: 200 mg via INTRAVENOUS
  Filled 2023-05-17: qty 200

## 2023-05-17 MED ORDER — 0.9 % SODIUM CHLORIDE (POUR BTL) OPTIME
TOPICAL | Status: DC | PRN
  Administered 2023-05-17: 1000 mL

## 2023-05-17 MED ORDER — METHYLERGONOVINE MALEATE 0.2 MG/ML IJ SOLN
INTRAMUSCULAR | Status: AC
  Filled 2023-05-17: qty 1

## 2023-05-17 MED ORDER — LIDOCAINE HCL 1 % IJ SOLN
INTRAMUSCULAR | Status: AC
  Filled 2023-05-17: qty 20

## 2023-05-17 MED ORDER — DEXAMETHASONE SODIUM PHOSPHATE 10 MG/ML IJ SOLN
INTRAMUSCULAR | Status: DC | PRN
  Administered 2023-05-17: 10 mg via INTRAVENOUS

## 2023-05-17 MED ORDER — FENTANYL CITRATE (PF) 250 MCG/5ML IJ SOLN
INTRAMUSCULAR | Status: DC | PRN
  Administered 2023-05-17: 100 ug via INTRAVENOUS

## 2023-05-17 MED ORDER — PROPOFOL 10 MG/ML IV BOLUS
INTRAVENOUS | Status: AC
  Filled 2023-05-17: qty 20

## 2023-05-17 MED ORDER — AMISULPRIDE (ANTIEMETIC) 5 MG/2ML IV SOLN: 10.0000 mg | Freq: Once | INTRAVENOUS | Status: DC | PRN

## 2023-05-17 MED ORDER — PHENYLEPHRINE 80 MCG/ML (10ML) SYRINGE FOR IV PUSH (FOR BLOOD PRESSURE SUPPORT)
PREFILLED_SYRINGE | INTRAVENOUS | Status: DC | PRN
  Administered 2023-05-17 (×5): 40 ug via INTRAVENOUS
  Administered 2023-05-17: 80 ug via INTRAVENOUS

## 2023-05-17 MED ORDER — MIDAZOLAM HCL 2 MG/2ML IJ SOLN
INTRAMUSCULAR | Status: AC
  Filled 2023-05-17: qty 2

## 2023-05-17 MED ORDER — HEATING PADS PADS: 1.0000 | MEDICATED_PAD | Status: DC | PRN

## 2023-05-17 MED ORDER — OXYCODONE HCL 5 MG/5ML PO SOLN
5.0000 mg | Freq: Once | ORAL | Status: DC | PRN
Start: 2023-05-17 — End: 2023-05-17

## 2023-05-17 MED ORDER — SODIUM CHLORIDE 0.9 % IV SOLN: INTRAVENOUS | Status: DC

## 2023-05-17 MED ORDER — ONDANSETRON HCL 4 MG/2ML IJ SOLN: 4.0000 mg | Freq: Once | INTRAMUSCULAR | Status: DC | PRN

## 2023-05-17 MED ORDER — MEPERIDINE HCL 25 MG/ML IJ SOLN: 6.2500 mg | INTRAMUSCULAR | Status: DC | PRN

## 2023-05-17 MED ORDER — EPHEDRINE SULFATE-NACL 50-0.9 MG/10ML-% IV SOSY
PREFILLED_SYRINGE | INTRAVENOUS | Status: DC | PRN
  Administered 2023-05-17: 10 mg via INTRAVENOUS

## 2023-05-17 MED ORDER — POVIDONE-IODINE 10 % EX SWAB
2.0000 | Freq: Once | CUTANEOUS | Status: AC
  Administered 2023-05-17: 2 via TOPICAL

## 2023-05-17 MED ORDER — LIDOCAINE HCL 1 % IJ SOLN
INTRAMUSCULAR | Status: DC | PRN
  Administered 2023-05-17: 10 mL

## 2023-05-17 MED ORDER — LIDOCAINE 2% (20 MG/ML) 5 ML SYRINGE
INTRAMUSCULAR | Status: DC | PRN
  Administered 2023-05-17: 60 mg via INTRAVENOUS

## 2023-05-17 MED ORDER — CHLORHEXIDINE GLUCONATE 0.12 % MT SOLN
15.0000 mL | Freq: Once | OROMUCOSAL | Status: AC
  Administered 2023-05-17: 15 mL via OROMUCOSAL
  Filled 2023-05-17: qty 15

## 2023-05-17 MED ORDER — OXYCODONE HCL 5 MG PO TABS: 5.0000 mg | ORAL_TABLET | Freq: Once | ORAL | Status: DC | PRN

## 2023-05-17 MED ORDER — MISOPROSTOL 200 MCG PO TABS
ORAL_TABLET | ORAL | Status: AC
  Filled 2023-05-17: qty 5

## 2023-05-17 MED ORDER — ONDANSETRON HCL 4 MG/2ML IJ SOLN
INTRAMUSCULAR | Status: DC | PRN
  Administered 2023-05-17: 4 mg via INTRAVENOUS

## 2023-05-17 MED ORDER — HYDROMORPHONE HCL 1 MG/ML IJ SOLN: 0.2500 mg | INTRAMUSCULAR | Status: DC | PRN

## 2023-05-17 MED ORDER — MIDAZOLAM HCL 2 MG/2ML IJ SOLN
INTRAMUSCULAR | Status: DC | PRN
  Administered 2023-05-17: 2 mg via INTRAVENOUS

## 2023-05-17 SURGICAL SUPPLY — 20 items
CATH ROBINSON RED A/P 16FR (CATHETERS) IMPLANT
FILTER UTR ASPR ASSEMBLY (MISCELLANEOUS) ×1 IMPLANT
GLOVE BIOGEL PI IND STRL 7.0 (GLOVE) ×1 IMPLANT
GLOVE BIOGEL PI IND STRL 7.5 (GLOVE) ×1 IMPLANT
GLOVE NEODERM STER SZ 7 (GLOVE) ×1 IMPLANT
GOWN STRL REUS W/ TWL LRG LVL3 (GOWN DISPOSABLE) ×1 IMPLANT
GOWN STRL REUS W/ TWL XL LVL3 (GOWN DISPOSABLE) ×1 IMPLANT
HOSE CONNECTING 18IN BERKELEY (TUBING) ×1 IMPLANT
KIT BERKELEY 1ST TRI 3/8 NO TR (MISCELLANEOUS) ×1 IMPLANT
KIT BERKELEY 1ST TRIMESTER 3/8 (MISCELLANEOUS) ×1 IMPLANT
NS IRRIG 1000ML POUR BTL (IV SOLUTION) ×1 IMPLANT
PACK VAGINAL MINOR WOMEN LF (CUSTOM PROCEDURE TRAY) ×1 IMPLANT
PAD OB MATERNITY 4.3X12.25 (PERSONAL CARE ITEMS) ×1 IMPLANT
SET BERKELEY SUCTION TUBING (SUCTIONS) ×1 IMPLANT
SPIKE FLUID TRANSFER (MISCELLANEOUS) ×1 IMPLANT
TOWEL GREEN STERILE FF (TOWEL DISPOSABLE) ×2 IMPLANT
VACURETTE 10 RIGID CVD (CANNULA) IMPLANT
VACURETTE 7MM CVD STRL WRAP (CANNULA) IMPLANT
VACURETTE 8 RIGID CVD (CANNULA) IMPLANT
VACURETTE 9 RIGID CVD (CANNULA) IMPLANT

## 2023-05-17 NOTE — Op Note (Signed)
Operative Note   05/17/2023  PRE-OP DIAGNOSIS: Missed Abortion   POST-OP DIAGNOSIS: Same  SURGEON: Surgeons and Role:    Corwin Bing, MD - Primary  ASSISTANT: None  PROCEDURE:  Suction dilation and curettage  ANESTHESIA: General and paracervical block  ESTIMATED BLOOD LOSS: 50mL  DRAINS: per OR note  TOTAL IV FLUIDS: per OR note  SPECIMENS: products of conception to pathology  VTE PROPHYLAXIS: SCDs to the bilateral lower extremities  ANTIBIOTICS: Doxycycline 200mg  IV x 1 pre op  COMPLICATIONS: none  DISPOSITION: PACU - hemodynamically stable.  CONDITION: stable  BLOOD TYPE: O POS. Rhogam given:not applicable  FINDINGS: Exam under anesthesia revealed 6-8 week sized uterus with no masses and bilateral adnexa without masses or fullness. Normal EGBUS, vaginal vault and cervix. Moderate amount of necrotic appearing products of conception were seen, with gritty texture in all four quadrants at the conclusion of the case.   PROCEDURE IN DETAIL:  After informed consent was obtained, the patient was taken to the operating room where anesthesia was obtained without difficulty. The patient was positioned in the dorsal lithotomy position in [place] stirrups. The patient was examined under anesthesia, with the above noted findings.  The bi-valved speculum was placed inside the patient's vagina, and the the anterior lip of the cervix was seen and grasped with the tenaculum.  A paracervical block was achieved with 20mL of 1% lidocaine and then the cervix was easly dilated to a pass a number 8 cannula.  The suction was then calibrated to and connected to the number 8 cannula, which was then introduced with the above noted findings. A gentle curettage was done at the end and yield no products of conception.   The suction was then done one more time to remove any remaining curettage material with a number 9 cannula  Excellent hemostasis was noted, and all instruments were removed,  with excellent hemostasis noted throughout.  She was then taken out of dorsal lithotomy. The patient tolerated the procedure well.  Sponge, lap and instrument counts were correct x2.  The patient was taken to recovery room in excellent condition.  Cornelia Copa MD Attending Center for [employer] [facility])

## 2023-05-17 NOTE — Discharge Instructions (Addendum)
   We will discuss your surgery once again in detail at your post-op visit in two to four weeks. If you haven't already done so, please call to make your appointment as soon as possible.  Dilation and Curettage or Vacuum Curettage, Care After These instructions give you information on caring for yourself after your procedure. Your doctor may also give you more specific instructions. Call your doctor if you have any problems or questions after your procedure. HOME CARE Do not drive for 24 hours. Wait 1 week before doing any activities that wear you out. Do not stand for a long time. Limit stair climbing to once or twice a day. Rest often. Continue with your usual diet. Drink enough fluids to keep your pee (urine) clear or pale yellow. If you have a hard time pooping (constipation), you may: Take a medicine to help you go poop (laxative) as told by your doctor. Eat more fruit and bran. Drink more fluids. Take showers, not baths, for as long as told by your doctor. Do not swim or use a hot tub until your doctor says it is okay. Have someone with you for 1day after the procedure. Do not douche, use tampons, or have sex (intercourse) until seen by your doctor Only take medicines as told by your doctor. Do not take aspirin. It can cause bleeding. Keep all doctor visits. GET HELP IF: You have cramps or pain not helped by medicine. You have new pain in the belly (abdomen). You have a bad smelling fluid coming from your vagina. You have a rash. You have problems with any medicine. GET HELP RIGHT AWAY IF:  You start to bleed more than a regular period. You have a fever. You have chest pain. You have trouble breathing. You feel dizzy or feel like passing out (fainting). You pass out. You have pain in the tops of your shoulders. You have vaginal bleeding with or without clumps of blood (blood clots). MAKE SURE YOU: Understand these instructions. Will watch your condition. Will get help  right away if you are not doing well or get worse. Document Released: 02/29/2008 Document Revised: 05/27/2013 Document Reviewed: 12/19/2012 ExitCare Patient Information 2015 [employer]. This information is not intended to replace advice given to you by your health care provider. Make sure you discuss any questions you have with your health care provider.   

## 2023-05-17 NOTE — H&P (Signed)
Obstetrics & Gynecology Surgical H&P   Date of Surgery: 05/17/2023   Primary OBGYN: [facility]  Reason for Admission: scheduled d&c for missed AB  History of Present Illness: Ms. Sosna is a 27 y.o. [phone] (Patient's last menstrual period was 03/02/2023 (exact date).), with the above CC. PMHx is significant for drug resistant epilepsy, HTN, migraines  Patient had an 11/22 u/s with [facility] OB that showed a SLIUP but with a FHR of 94 with CRL of 4.38mm c/w [redacted]w[redacted]d.  She went to the ED on 12/7 for VB and had an u/s that did not show cardiac activity with a CRL of 4.15mm c/w [redacted]w[redacted]d. She went to [facility] for a second opinion on 12/8 which also showed no cardiac activity with CRL of 7mm c/w [redacted]w[redacted]d. She was to have the d&c with [facility] but didn't b/c they were out of network. Patient presented to [facility] on 12/9 with the above issues and was set up with a d&c with [place].  Since then, no bleeding, fevers, chills. Patient last had a seizure on Monday and she has seen her neurologist and started on a regimen  ROS: A 12-point review of systems was performed and negative, except as stated in the above HPI.  OBGYN History: As per HPI. OB History  Gravida Para Term Preterm AB Living  4 1 1  0 2 1  SAB IAB Ectopic Multiple Live Births  2 0 0 0 1    # Outcome Date GA Lbr Len/2nd Weight Sex Type Anes PTL Lv  4 Current           3 SAB 2021     Biochemical     2 SAB 2019          1 Term 10/17/13 [redacted]w[redacted]d  2778 g F Vag-Spont   LIV     Complications: Preeclampsia     Past Medical History: Past Medical History:  Diagnosis Date   Anxiety    Bilateral nephrolithiasis    Depression    Endometriosis    Epilepsy (HCC)    GERD (gastroesophageal reflux disease)    History of kidney stones    HSV-2 infection    Hypertension    Inactive tuberculosis    Migraines    Nausea & vomiting 11/23/2022   Ovarian cyst    Palpitations    PVC (premature ventricular contraction)     Seizures (HCC)    last seizure was 2 weeks ago (app.12-18-22)   Status post placement of VNS (vagus nerve stimulation) device    Syncope and collapse 06/10/2019   Thyroid nodule     Past Surgical History: Past Surgical History:  Procedure Laterality Date   CYSTOSCOPY W/ URETERAL STENT REMOVAL Left 01/09/2023   Procedure: CYSTOSCOPY WITH STENT REMOVAL;  Surgeon: Riki Altes, MD;  Location: [facility] ORS;  Service: Urology;  Laterality: Left;   CYSTOSCOPY/URETEROSCOPY/HOLMIUM LASER/STENT PLACEMENT Left 01/02/2023   Procedure: CYSTOSCOPY/URETEROSCOPY/HOLMIUM LASER/STENT PLACEMENT;  Surgeon: Riki Altes, MD;  Location: [facility] OR[facility];  Service: Urology;  Laterality: Left;   CYSTOSCOPY/URETEROSCOPY/HOLMIUM LASER/STENT PLACEMENT Right 01/09/2023   Procedure: CYSTOSCOPY/URETEROSCOPY/HOLMIUM LASER/STENT PLACEMENT;  Surgeon: Riki Altes, MD;  Location: [facility] OR[facility];  Service: Urology;  Laterality: Right;   ENDOMETRIAL ABLATION  06/2022   [facility]   LAPAROSCOPY     x5 for endometriosis   LOOP RECORDER INSERTION  2020   LOOP RECORDER REMOVAL  2021   TONSILLECTOMY     URETEROSCOPY Left 01/09/2023   Procedure: URETEROSCOPY;  Surgeon:  Stoioff, Verna Czech, MD;  Location: [facility] ORS;  Service: Urology;  Laterality: Left;   VAGUS NERVE STIMULATOR INSERTION  2022    Family History:  Family History  Adopted: Yes  Problem Relation Age of Onset   Healthy Daughter     Social History:  Social History   Socioeconomic History   Marital status: Single    Spouse name: Not on file   Number of children: 1   Years of education: Not on file   Highest education level: Some college, no degree  Occupational History   Not on file  Tobacco Use   Smoking status: Never   Smokeless tobacco: Never  Vaping Use   Vaping status: Former   Substances: Nicotine  Substance and Sexual Activity   Alcohol use: Not Currently    Comment: socially   Drug use: Not Currently    Comment: THC   Sexual activity: Yes  Other Topics  Concern   Not on file  Social History Narrative   Pt lives alone with daughter in 2 story home   Right handed   Pt drinks coffee everyday, sometime tea at night, soda-coke zero not often   exercise 3-4 times a week          Social Drivers of Corporate investment banker Strain: Not on file  Food Insecurity: No Food Insecurity (05/09/2023)   Hunger Vital Sign    Worried About Running Out of Food in the Last Year: Never true    Ran Out of Food in the Last Year: Never true  Transportation Needs: No Transportation Needs (05/09/2023)   PRAPARE - Administrator, Civil Service (Medical): No    Lack of Transportation (Non-Medical): No  Physical Activity: Not on file  Stress: Not on file  Social Connections: Not on file  Intimate Partner Violence: Not on file    Allergy: Allergies  Allergen Reactions   Nsaids Other (See Comments)    History of bleeding ulcers   Ativan [Lorazepam] Itching   Compazine [Prochlorperazine]    Diphenhydramine Other (See Comments)    "Crawling feeling under skin"   Ibuprofen     Other reaction(s): GI Upset (intolerance)   Keppra [Levetiracetam] Other (See Comments)    "Makes her feel hungover"    Zolpidem Other (See Comments)    seizure   Emgality [Galcanezumab-Gnlm] Rash    Current Outpatient Medications: Medications Prior to Admission  Medication Sig Dispense Refill Last Dose/Taking   acetaminophen (TYLENOL) 325 MG tablet Take 650 mg by mouth every 6 (six) hours as needed.   05/16/2023   cyclobenzaprine (FLEXERIL) 10 MG tablet Take 1 tablet (10 mg total) by mouth 3 (three) times daily as needed. 30 tablet 0 05/16/2023   diazepam (VALIUM) 10 MG tablet Take 1 tablet (10 mg total) by mouth every 6 (six) hours as needed (Seizure/aura). 30 tablet 0 05/16/2023   escitalopram (LEXAPRO) 10 MG tablet Take by mouth.   05/16/2023   OXcarbazepine (TRILEPTAL) 150 MG tablet Take 1 tablet (150 mg total) by mouth daily. 30 tablet 5 05/16/2023    oxyCODONE (OXY IR/ROXICODONE) 5 MG immediate release tablet Take 1 tablet (5 mg total) by mouth every 6 (six) hours as needed for severe pain (pain score 7-10). 30 tablet 0 05/16/2023   aspirin EC 81 MG tablet Take 1 tablet (81 mg total) by mouth daily. Take after 12 weeks for prevention of preeclampsia later in pregnancy (Patient not taking: Reported on 05/15/2023) 300 tablet 2 Not  Taking   diazepam (DIASTAT ACUDIAL) 10 MG GEL Place 10 mg rectally once for 1 dose. (Patient not taking: Reported on 05/15/2023) 1 each 2 Not Taking   famotidine (PEPCID) 20 MG tablet Take 1 tablet (20 mg total) by mouth 2 (two) times daily. (Patient not taking: Reported on 05/15/2023) 60 tablet 3 Not Taking   valACYclovir (VALTREX) 500 MG tablet Take 500 mg by mouth daily as needed (Cold sores).   More than a month     Hospital Medications: Current Facility-Administered Medications  Medication Dose Route Frequency Provider Last Rate Last Admin   0.9 %  sodium chloride infusion   Intravenous Continuous Lacy Duverney M, DO       doxycycline (VIBRAMYCIN) 200 mg in dextrose 5 % 250 mL IVPB  200 mg Intravenous Once Parnell Bing, MD         Physical Exam:  Current Vital Signs 24h Vital Sign Ranges  T 98.3 F (36.8 C) Temp  Avg: 98.3 F (36.8 C)  Min: 98.3 F (36.8 C)  Max: 98.3 F (36.8 C)  BP 115/71 BP  Min: 115/71  Max: 115/71  HR 88 Pulse  Avg: 88  Min: 88  Max: 88  RR 18 Resp  Avg: 18  Min: 18  Max: 18  SaO2 98 % Room Air SpO2  Avg: 98 %  Min: 98 %  Max: 98 %       24 Hour I/O Current Shift I/O  Time Ins Outs No intake/output data recorded. No intake/output data recorded.    Body mass index is 25.61 kg/m. From 12/9 Constitutional: Well-developed, well-nourished female in no acute distress  Cardiovascular: normal rate, regular rhythm Respiratory: normal effort GI: Abd soft, non-tender MS: Extremities nontender, no edema, normal ROM Neurologic: Alert and oriented x 4  Laboratory: T&s  pending Recent Labs  Lab 05/12/23 1845 05/14/23 2006  WBC 8.8 6.8  HGB 14.6 13.7  HCT 43.1 39.6  PLT 265 270   Recent Labs  Lab 05/14/23 2006  NA 136  K 3.6  CL 105  CO2 23  BUN 12  CREATININE 0.50  CALCIUM 9.4  PROT 6.9  BILITOT 0.5  ALKPHOS 64  ALT 21  AST 22  GLUCOSE 133*   No results for input(s): "APTT", "INR", "PTT" in the last 168 hours.  Invalid input(s): "DRHAPTT" Recent Labs  Lab 05/17/23 1136  ABORH PENDING   Imaging:  This result has an attachment that is not available. EXAM: US OB TRANSVAGINAL ACCESSION: [id number] UN REPORT DATE: 05/13/2023 4:16 PM  CLINICAL INDICATION: eval for miscarriage   LMP: 03/15/2023 HCG 20,788.6  COMPARISON: None available  TECHNIQUE: As per sonographer, the endovaginal pelvic procedure was explained to the patient and the patient verbally consented to the exam. Ultrasound views of the pelvis were obtained endovaginally using gray scale and color Doppler imaging.  FINDINGS:  UTERUS: The uterus measures 9.4 x 4.9 x 5.6 cm.  An intratuterine gestational sac, yolk sac and embryo were present.   Crown rump length of 0.7 cm corresponded with an estimated gestational age of [redacted] weeks 6 days.  Gestational sac position was Normal.  Cardiac activity was Absent.  It was too early to evaluate placental location or measure amniotic fluid index.  Subchorionic hematoma measuring up to 2.5 cm.  OVARIES: The ovaries were seen well transvaginally. Small cystic areas were seen within both ovaries compatible with follicles. Appropriate arterial inflow and venous outflow was seen in both ovaries with color doppler.  Right ovary: 2.5 x 1.8 x 2.3 cm      Left ovary: 2.4 x 1.5 x 1.9 cm  OTHER: No abnormal pelvic free fluid. Procedure Note  Pietryga, Zorita Pang, MD - 05/13/2023 Formatting of this note might be different from the original. EXAM: US OB TRANSVAGINAL ACCESSION: [id number] UN REPORT DATE: 05/13/2023 4:16  PM  CLINICAL INDICATION: eval for miscarriage   LMP: 03/15/2023 HCG 20,788.6  COMPARISON: None available  TECHNIQUE: As per sonographer, the endovaginal pelvic procedure was explained to the patient and the patient verbally consented to the exam. Ultrasound views of the pelvis were obtained endovaginally using gray scale and color Doppler imaging.  FINDINGS:  UTERUS: The uterus measures 9.4 x 4.9 x 5.6 cm.  An intratuterine gestational sac, yolk sac and embryo were present.   Crown rump length of 0.7 cm corresponded with an estimated gestational age of [redacted] weeks 6 days.  Gestational sac position was Normal.  Cardiac activity was Absent.  It was too early to evaluate placental location or measure amniotic fluid index.  Subchorionic hematoma measuring up to 2.5 cm.  OVARIES: The ovaries were seen well transvaginally. Small cystic areas were seen within both ovaries compatible with follicles. Appropriate arterial inflow and venous outflow was seen in both ovaries with color doppler.      Right ovary: 2.5 x 1.8 x 2.3 cm      Left ovary: 2.4 x 1.5 x 1.9 cm  OTHER: No abnormal pelvic free fluid.  IMPRESSION:  Intrauterine pregnancy with findings diagnostic of early pregnancy loss.  Clinical management and follow up per obstetrical team, which can include ectopic precautions, trending of hCG, and follow up ultrasound as indicated.   Please note that this examination was not performed for purposes of assessing fetal anatomy and/or the placenta and is not diagnostic for these purposes. This is not a surrogate for and should not replace dedicated fetal anatomy scan. Exam End: 05/13/23 16:38   Specimen Collected: 05/13/23 16:16 Last Resulted: 05/13/23 17:26  Received From: [facility]  Result Received: 05/14/23 00:04    Assessment: Ms. Munir is a 27 y.o. [id number] (Patient's last menstrual period was 03/02/2023 (exact date).) here for scheduled d&c; patient stable  Plan: D/w  her and partner re: suction d&c and they are amenable with proceeding. Can start when OR is ready.    Cornelia Copa MD Attending Center for [facility] (Faculty Practice) GYN Consult Phone: [phone] (M-F, [phone]) & [phone] (Off hours, weekends, holidays)

## 2023-05-17 NOTE — Anesthesia Postprocedure Evaluation (Signed)
Anesthesia Post Note  Patient: Pamela Booker  Procedure(s) Performed: DILATATION AND EVACUATION (Vagina )     Patient location during evaluation: PACU Anesthesia Type: General Level of consciousness: awake and alert, oriented and patient cooperative Pain management: pain level controlled Vital Signs Assessment: post-procedure vital signs reviewed and stable Respiratory status: spontaneous breathing, nonlabored ventilation and respiratory function stable Cardiovascular status: blood pressure returned to baseline and stable Postop Assessment: no apparent nausea or vomiting Anesthetic complications: no   No notable events documented.  Last Vitals:  Vitals:   05/17/23 1415 05/17/23 1430  BP: 102/70 (!) 102/58  Pulse: 81 73  Resp: 10 (!) 9  Temp:    SpO2: 98% 98%    Last Pain:  Vitals:   05/17/23 1430  TempSrc:   PainSc: 0-No pain                 Lannie Fields

## 2023-05-17 NOTE — Transfer of Care (Signed)
Immediate Anesthesia Transfer of Care Note  Patient: Pamela Booker  Procedure(s) Performed: DILATATION AND EVACUATION (Vagina )  Patient Location: PACU  Anesthesia Type:General  Level of Consciousness: awake, alert , oriented, and patient cooperative  Airway & Oxygen Therapy: Patient Spontanous Breathing and Patient connected to face mask oxygen  Post-op Assessment: Report given to RN and Post -op Vital signs reviewed and stable  Post vital signs: Reviewed and stable  Last Vitals:  Vitals Value Taken Time  BP 108/63 05/17/23 1351  Temp 36.6 C 05/17/23 1351  Pulse 84 05/17/23 1354  Resp 8 05/17/23 1354  SpO2 96 % 05/17/23 1354  Vitals shown include unfiled device data.  Last Pain:  Vitals:   05/17/23 1144  TempSrc:   PainSc: 0-No pain         Complications: No notable events documented.

## 2023-05-17 NOTE — Anesthesia Procedure Notes (Signed)
Procedure Name: LMA Insertion Date/Time: 05/17/2023 1:07 PM  Performed by: Wilder Glade, CRNAPre-anesthesia Checklist: Patient identified, Emergency Drugs available, Suction available, Patient being monitored and Timeout performed Patient Re-evaluated:Patient Re-evaluated prior to induction Oxygen Delivery Method: Circle system utilized Preoxygenation: Pre-oxygenation with 100% oxygen Induction Type: IV induction Ventilation: Mask ventilation without difficulty LMA: LMA inserted LMA Size: 4.0 Tube type: Oral Number of attempts: 1 Airway Equipment and Method: Stylet Placement Confirmation: positive ETCO2 and breath sounds checked- equal and bilateral ETT to lip (cm): yes. Tube secured with: Tape Comments: Smooth brief atraumatic dentition unchanged

## 2023-05-18 ENCOUNTER — Encounter (HOSPITAL_COMMUNITY): Payer: Self-pay | Admitting: Obstetrics and Gynecology

## 2023-05-18 LAB — SURGICAL PATHOLOGY

## 2023-05-21 ENCOUNTER — Encounter (HOSPITAL_COMMUNITY): Payer: Self-pay | Admitting: Obstetrics & Gynecology

## 2023-05-21 ENCOUNTER — Inpatient Hospital Stay (HOSPITAL_COMMUNITY)
Admission: AD | Admit: 2023-05-21 | Discharge: 2023-05-21 | Disposition: A | Discharge status: Home / Self Care | Payer: 59 | Attending: Family Medicine | Admitting: Family Medicine

## 2023-05-21 ENCOUNTER — Inpatient Hospital Stay (HOSPITAL_COMMUNITY): Payer: 59

## 2023-05-21 DIAGNOSIS — R109 Unspecified abdominal pain: Secondary | ICD-10-CM | POA: Diagnosis not present

## 2023-05-21 DIAGNOSIS — K5900 Constipation, unspecified: Secondary | ICD-10-CM | POA: Diagnosis not present

## 2023-05-21 DIAGNOSIS — B349 Viral infection, unspecified: Secondary | ICD-10-CM | POA: Diagnosis not present

## 2023-05-21 DIAGNOSIS — R1084 Generalized abdominal pain: Secondary | ICD-10-CM | POA: Diagnosis not present

## 2023-05-21 DIAGNOSIS — O039 Complete or unspecified spontaneous abortion without complication: Secondary | ICD-10-CM | POA: Diagnosis not present

## 2023-05-21 DIAGNOSIS — Z9889 Other specified postprocedural states: Secondary | ICD-10-CM

## 2023-05-21 DIAGNOSIS — R1030 Lower abdominal pain, unspecified: Secondary | ICD-10-CM | POA: Diagnosis not present

## 2023-05-21 DIAGNOSIS — R300 Dysuria: Secondary | ICD-10-CM | POA: Diagnosis not present

## 2023-05-21 LAB — CBC
HCT: 35.8 % — ABNORMAL LOW (ref 36.0–46.0)
Hemoglobin: 12 g/dL (ref 12.0–15.0)
MCH: 32.8 pg (ref 26.0–34.0)
MCHC: 33.5 g/dL (ref 30.0–36.0)
MCV: 97.8 fL (ref 80.0–100.0)
Platelets: 231 10*3/uL (ref 150–400)
RBC: 3.66 MIL/uL — ABNORMAL LOW (ref 3.87–5.11)
RDW: 12.8 % (ref 11.5–15.5)
WBC: 7.2 10*3/uL (ref 4.0–10.5)
nRBC: 0 % (ref 0.0–0.2)

## 2023-05-21 LAB — URINALYSIS, ROUTINE W REFLEX MICROSCOPIC
Bilirubin Urine: NEGATIVE
Glucose, UA: 150 mg/dL — AB
Hgb urine dipstick: NEGATIVE
Ketones, ur: NEGATIVE mg/dL
Leukocytes,Ua: NEGATIVE
Nitrite: NEGATIVE
Protein, ur: NEGATIVE mg/dL
Specific Gravity, Urine: 1.017 (ref 1.005–1.030)
pH: 5 (ref 5.0–8.0)

## 2023-05-21 LAB — RESP PANEL BY RT-PCR (RSV, FLU A&B, COVID)  RVPGX2
Influenza A by PCR: NEGATIVE
Influenza B by PCR: NEGATIVE
Resp Syncytial Virus by PCR: NEGATIVE
SARS Coronavirus 2 by RT PCR: NEGATIVE

## 2023-05-21 LAB — GROUP A STREP BY PCR: Group A Strep by PCR: NOT DETECTED

## 2023-05-21 NOTE — MAU Provider Note (Signed)
History     [id number]  Arrival date and time: 05/21/23 1422    Chief Complaint  Patient presents with   Abdominal Pain   Generalized Body Aches   Fever     HPI Pamela Booker is a 27 y.o. at [redacted]w[redacted]d with PMHx notable for recent SAB c/b retained POC's requiring D&C on 05/17/2023, who presents for abdominal pain and low grade fevers.   Review of hospital course at last admission: patient had nonviable pregnancy diagnosed early 05/2023. She came for scheduled D&C on 05/17/2023 and had an uncomplicated procedure. Pathology was consistent with retained POC's. She was given 200 mg IV doxycycline peri-op.   Patient reports ongoing lower abdominal pain and carmping since D&C This has also spread to be aching and discomfort in her arms and legs A little sore throat today but no cough or nasal discharge/congestion Has had slightly high temps at home, t max 99.24F No sick contacts that she is aware of Has not had vaginal bleeding for about a day Endorses chronic constipation but had some diarrhea today  --/--/O POS (12/12 1158)  OB History     Gravida  4   Para  1   Term  1   Preterm  0   AB  2   Living  1      SAB  2   IAB  0   Ectopic  0   Multiple  0   Live Births  1           Past Medical History:  Diagnosis Date   Anxiety    Bilateral nephrolithiasis    Depression    Endometriosis    Epilepsy (HCC)    GERD (gastroesophageal reflux disease)    History of kidney stones    HSV-2 infection    Hypertension    Inactive tuberculosis    Migraines    Nausea & vomiting 11/23/2022   Ovarian cyst    Palpitations    PVC (premature ventricular contraction)    Seizures (HCC)    last seizure was 2 weeks ago (app.12-18-22)   Status post placement of VNS (vagus nerve stimulation) device    Syncope and collapse 06/10/2019   Thyroid nodule     Past Surgical History:  Procedure Laterality Date   CYSTOSCOPY W/ URETERAL STENT REMOVAL Left 01/09/2023    Procedure: CYSTOSCOPY WITH STENT REMOVAL;  Surgeon: Riki Altes, MD;  Location: [facility];  Service: Urology;  Laterality: Left;   CYSTOSCOPY/URETEROSCOPY/HOLMIUM LASER/STENT PLACEMENT Left 01/02/2023   Procedure: CYSTOSCOPY/URETEROSCOPY/HOLMIUM LASER/STENT PLACEMENT;  Surgeon: Riki Altes, MD;  Location: [facility];  Service: Urology;  Laterality: Left;   CYSTOSCOPY/URETEROSCOPY/HOLMIUM LASER/STENT PLACEMENT Right 01/09/2023   Procedure: CYSTOSCOPY/URETEROSCOPY/HOLMIUM LASER/STENT PLACEMENT;  Surgeon: Riki Altes, MD;  Location: [facility];  Service: Urology;  Laterality: Right;   DILATION AND EVACUATION N/A 05/17/2023   Procedure: DILATATION AND EVACUATION;  Surgeon: Baraga Bing, MD;  Location: [facility];  Service: Gynecology;  Laterality: N/A;   ENDOMETRIAL ABLATION  06/2022   [facility]   LAPAROSCOPY     x5 for endometriosis   LOOP RECORDER INSERTION  2020   LOOP RECORDER REMOVAL  2021   TONSILLECTOMY     URETEROSCOPY Left 01/09/2023   Procedure: URETEROSCOPY;  Surgeon: Riki Altes, MD;  Location: [facility];  Service: Urology;  Laterality: Left;   VAGUS NERVE STIMULATOR INSERTION  2022    Family History  Adopted: Yes  Problem Relation Age of Onset   Healthy Daughter  Social History   Socioeconomic History   Marital status: Single    Spouse name: Not on file   Number of children: 1   Years of education: Not on file   Highest education level: Some college, no degree  Occupational History   Not on file  Tobacco Use   Smoking status: Never   Smokeless tobacco: Never  Vaping Use   Vaping status: Former   Substances: Nicotine  Substance and Sexual Activity   Alcohol use: Not Currently    Comment: socially   Drug use: Not Currently    Comment: THC   Sexual activity: Yes  Other Topics Concern   Not on file  Social History Narrative   Pt lives alone with daughter in 2 story home   Right handed   Pt drinks coffee everyday, sometime tea at night, soda-coke zero  not often   exercise 3-4 times a week          Social Drivers of Corporate investment banker Strain: Not on file  Food Insecurity: No Food Insecurity (05/09/2023)   Hunger Vital Sign    Worried About Running Out of Food in the Last Year: Never true    Ran Out of Food in the Last Year: Never true  Transportation Needs: No Transportation Needs (05/09/2023)   PRAPARE - Administrator, Civil Service (Medical): No    Lack of Transportation (Non-Medical): No  Physical Activity: Not on file  Stress: Not on file  Social Connections: Not on file  Intimate Partner Violence: Not on file    Allergies  Allergen Reactions   Nsaids Other (See Comments)    History of bleeding ulcers   Ativan [Lorazepam] Itching   Compazine [Prochlorperazine]    Diphenhydramine Other (See Comments)    "Crawling feeling under skin"   Ibuprofen     Other reaction(s): GI Upset (intolerance)   Keppra [Levetiracetam] Other (See Comments)    "Makes her feel hungover"    Zolpidem Other (See Comments)    seizure   Emgality [Galcanezumab-Gnlm] Rash    No current facility-administered medications on file prior to encounter.   Current Outpatient Medications on File Prior to Encounter  Medication Sig Dispense Refill   acetaminophen (TYLENOL) 325 MG tablet Take 650 mg by mouth every 6 (six) hours as needed.     cyclobenzaprine (FLEXERIL) 10 MG tablet Take 1 tablet (10 mg total) by mouth 3 (three) times daily as needed. 30 tablet 0   OXcarbazepine (TRILEPTAL) 150 MG tablet Take 1 tablet (150 mg total) by mouth daily. 30 tablet 5   oxyCODONE (OXY IR/ROXICODONE) 5 MG immediate release tablet Take 1 tablet (5 mg total) by mouth every 6 (six) hours as needed for severe pain (pain score 7-10). 30 tablet 0   aspirin EC 81 MG tablet Take 1 tablet (81 mg total) by mouth daily. Take after 12 weeks for prevention of preeclampsia later in pregnancy (Patient not taking: Reported on 05/15/2023) 300 tablet 2   diazepam  (DIASTAT ACUDIAL) 10 MG GEL Place 10 mg rectally once for 1 dose. (Patient not taking: Reported on 05/15/2023) 1 each 2   diazepam (VALIUM) 10 MG tablet Take 1 tablet (10 mg total) by mouth every 6 (six) hours as needed (Seizure/aura). 30 tablet 0   escitalopram (LEXAPRO) 10 MG tablet Take by mouth.     famotidine (PEPCID) 20 MG tablet Take 1 tablet (20 mg total) by mouth 2 (two) times daily. (Patient not taking: Reported  on 05/15/2023) 60 tablet 3   valACYclovir (VALTREX) 500 MG tablet Take 500 mg by mouth daily as needed (Cold sores).       ROS Pertinent positives and negative per HPI, all others reviewed and negative  Physical Exam   BP 112/66 (BP Location: Right Arm)   Pulse 86   Temp 98.1 F (36.7 C) (Oral)   Resp 14   Ht 5\' 2"  (1.575 m)   Wt 72.2 kg   LMP 03/02/2023 (Exact Date)   SpO2 99%   BMI 29.12 kg/m   Patient Vitals for the past 24 hrs:  BP Temp Temp src Pulse Resp SpO2 Height Weight  05/21/23 1621 -- 98.1 F (36.7 C) Oral 86 -- -- -- --  05/21/23 1501 112/66 98.5 F (36.9 C) Oral (!) 105 -- 99 % -- --  05/21/23 1448 110/63 98.1 F (36.7 C) Oral (!) 127 14 97 % 5\' 2"  (1.575 m) 72.2 kg    Physical Exam Vitals reviewed.  Constitutional:      General: She is not in acute distress.    Appearance: She is well-developed. She is not diaphoretic.  Eyes:     General: No scleral icterus. Pulmonary:     Effort: Pulmonary effort is normal. No respiratory distress.  Abdominal:     General: There is no distension.     Palpations: Abdomen is soft.     Tenderness: There is generalized abdominal tenderness. There is no guarding or rebound.  Skin:    General: Skin is warm and dry.  Neurological:     Mental Status: She is alert.     Coordination: Coordination normal.      Cervical Exam    Bedside Ultrasound Not performed.  My interpretation: n/a  FHT N/a  Labs Results for orders placed or performed during the hospital encounter of 05/21/23 (from the past  24 hours)  Urinalysis, Routine w reflex microscopic -Urine, Clean Catch     Status: Abnormal   Collection Time: 05/21/23  3:09 PM  Result Value Ref Range   Color, Urine YELLOW YELLOW   APPearance CLEAR CLEAR   Specific Gravity, Urine 1.017 1.005 - 1.030   pH 5.0 5.0 - 8.0   Glucose, UA 150 (A) NEGATIVE mg/dL   Hgb urine dipstick NEGATIVE NEGATIVE   Bilirubin Urine NEGATIVE NEGATIVE   Ketones, ur NEGATIVE NEGATIVE mg/dL   Protein, ur NEGATIVE NEGATIVE mg/dL   Nitrite NEGATIVE NEGATIVE   Leukocytes,Ua NEGATIVE NEGATIVE   RBC / HPF 0-5 0 - 5 RBC/hpf   WBC, UA 0-5 0 - 5 WBC/hpf   Bacteria, UA RARE (A) NONE SEEN   Squamous Epithelial / HPF 0-5 0 - 5 /HPF   Mucus PRESENT   Resp panel by RT-PCR (RSV, Flu A&B, Covid) Anterior Nasal Swab     Status: None   Collection Time: 05/21/23  3:52 PM   Specimen: Anterior Nasal Swab  Result Value Ref Range   SARS Coronavirus 2 by RT PCR NEGATIVE NEGATIVE   Influenza A by PCR NEGATIVE NEGATIVE   Influenza B by PCR NEGATIVE NEGATIVE   Resp Syncytial Virus by PCR NEGATIVE NEGATIVE  Group A Strep by PCR     Status: None   Collection Time: 05/21/23  3:52 PM   Specimen: Throat; Sterile Swab  Result Value Ref Range   Group A Strep by PCR NOT DETECTED NOT DETECTED  CBC     Status: Abnormal   Collection Time: 05/21/23  4:09 PM  Result Value Ref  Range   WBC 7.2 4.0 - 10.5 K/uL   RBC 3.66 (L) 3.87 - 5.11 MIL/uL   Hemoglobin 12.0 12.0 - 15.0 g/dL   HCT 16.1 (L) 09.6 - 04.5 %   MCV 97.8 80.0 - 100.0 fL   MCH 32.8 26.0 - 34.0 pg   MCHC 33.5 30.0 - 36.0 g/dL   RDW 40.9 81.1 - 91.4 %   Platelets 231 150 - 400 K/uL   nRBC 0.0 0.0 - 0.2 %    Imaging DG Abd 1 View Result Date: 05/21/2023 CLINICAL DATA:  [id number] Post-op pain 209036. Abdominal pain and distention. EXAM: ABDOMEN - 1 VIEW COMPARISON:  12/25/2022. FINDINGS: The bowel gas pattern is non-obstructive. There is moderate-to-large stool burden, including the ascending colon, compatible with colonic  hypomotility. No evidence of pneumoperitoneum, within the limitations of a supine film. No acute osseous abnormalities. The soft tissues are within normal limits. Surgical changes, devices, tubes and lines: None. IMPRESSION: *Nonobstructive bowel gas pattern.  Constipation. Electronically Signed   By: Jules Schick M.D.   On: 05/21/2023 18:02    MAU Course  Procedures Lab Orders         Resp panel by RT-PCR (RSV, Flu A&B, Covid) Anterior Nasal Swab         Group A Strep by PCR         Urinalysis, Routine w reflex microscopic -Urine, Clean Catch         CBC    No orders of the defined types were placed in this encounter.  Imaging Orders         DG Abd 1 View         US PELVIC COMPLETE WITH TRANSVAGINAL      MDM Moderate (Level 3-4)  Assessment and Plan  # Abdominal pain, generalized #Viral syndrome #Constipation Patient presenting with abdominal pain, diffuse myalgia, and some diarrhea. Initially focused on ruling out complications from recent D&C. Formal read not yet back but pelvic US does not show any evidence of retained POC's. White count also normal at 7.2, endometritis or other associated complicated from procedure very unlikely. Overall symptoms most c/w viral syndrome, nasal swab and strep swab negative for common infections. Discussed supportive care. Of note significant constipation seen on KUB, recommended regular miralax.    Dispo: discharged to home in stable condition   Venora Maples, MD/MPH 05/21/23 7:06 PM  Allergies as of 05/21/2023       Reactions   Nsaids Other (See Comments)   History of bleeding ulcers   Ativan [lorazepam] Itching   Compazine [prochlorperazine]    Diphenhydramine Other (See Comments)   "Crawling feeling under skin"   Ibuprofen    Other reaction(s): GI Upset (intolerance)   Keppra [levetiracetam] Other (See Comments)   "Makes her feel hungover"    Zolpidem Other (See Comments)   seizure   Emgality [galcanezumab-gnlm] Rash         Medication List     STOP taking these medications    aspirin EC 81 MG tablet   oxyCODONE 5 MG immediate release tablet Commonly known as: Oxy IR/ROXICODONE       TAKE these medications    acetaminophen 325 MG tablet Commonly known as: TYLENOL Take 650 mg by mouth every 6 (six) hours as needed.   cyclobenzaprine 10 MG tablet Commonly known as: FLEXERIL Take 1 tablet (10 mg total) by mouth 3 (three) times daily as needed.   diazepam 10 MG Gel Commonly known as: DIASTAT ACUDIAL  Place 10 mg rectally once for 1 dose.   diazepam 10 MG tablet Commonly known as: Valium Take 1 tablet (10 mg total) by mouth every 6 (six) hours as needed (Seizure/aura).   escitalopram 10 MG tablet Commonly known as: LEXAPRO Take by mouth.   famotidine 20 MG tablet Commonly known as: PEPCID Take 1 tablet (20 mg total) by mouth 2 (two) times daily.   Heating Pads Pads 1 Pad by Does not apply route as needed (cramping).   OXcarbazepine 150 MG tablet Commonly known as: Trileptal Take 1 tablet (150 mg total) by mouth daily.   valACYclovir 500 MG tablet Commonly known as: VALTREX Take 500 mg by mouth daily as needed (Cold sores).

## 2023-05-21 NOTE — Discharge Instructions (Signed)
You were seen in the MAU for abdominal pain and body aches. We did a thorough work up with no evidence of complications or infection from your recent procedure. You likely have a viral infection that will take a little time to get better.

## 2023-05-21 NOTE — MAU Note (Addendum)
.  Pamela Booker is a 27 y.o. at [redacted]w[redacted]d here in MAU reporting: Here for ongoing lower abdominal pain post D&C on 12/12. She reports she is no longer experiencing vaginal bleeding as of today. She reports she is also concerned because she has been trying to urinate and she has to force her urine out and she feels immense pressure when doing this. She is also reporting intermittent low grade fevers at home since last night. She reports her last temp was 99.8 F. She reports she overall does not feel well. Denies cough or SOB. Denies anyone around her being sick.  Last BM today.   Last doses: Tylenol 1200 Flexeril 1000  Onset of complaint: ongoing since 12/12 Pain score: 4/10 lower abdomen  Vitals:   05/21/23 1448  BP: 110/63  Pulse: (!) 127  Resp: 14  Temp: 98.1 F (36.7 C)  SpO2: 97%     Lab orders placed from triage: UA

## 2023-05-24 ENCOUNTER — Telehealth: Payer: 59 | Admitting: Physician Assistant

## 2023-05-24 DIAGNOSIS — J329 Chronic sinusitis, unspecified: Secondary | ICD-10-CM | POA: Diagnosis not present

## 2023-05-24 MED ORDER — AMOXICILLIN-POT CLAVULANATE 875-125 MG PO TABS: 1.0000 | ORAL_TABLET | Freq: Two times a day (BID) | ORAL | 0 refills | Status: AC

## 2023-05-24 NOTE — Progress Notes (Signed)
Virtual Visit Consent   Pamela Booker, you are scheduled for a virtual visit with a [facility] provider today. Just as with appointments in the office, your consent must be obtained to participate. Your consent will be active for this visit and any virtual visit you may have with one of our providers in the next 365 days. If you have a MyChart account, a copy of this consent can be sent to you electronically.  As this is a virtual visit, video technology does not allow for your provider to perform a traditional examination. This may limit your provider's ability to fully assess your condition. If your provider identifies any concerns that need to be evaluated in person or the need to arrange testing (such as labs, EKG, etc.), we will make arrangements to do so. Although advances in technology are sophisticated, we cannot ensure that it will always work on either your end or our end. If the connection with a video visit is poor, the visit may have to be switched to a telephone visit. With either a video or telephone visit, we are not always able to ensure that we have a secure connection.  By engaging in this virtual visit, you consent to the provision of healthcare and authorize for your insurance to be billed (if applicable) for the services provided during this visit. Depending on your insurance coverage, you may receive a charge related to this service.  I need to obtain your verbal consent now. Are you willing to proceed with your visit today? Pamela Booker has provided verbal consent on 05/24/2023 for a virtual visit (video or telephone). Gilberto Better, [place]  Date: 05/24/2023 1:40 PM  Virtual Visit via Video Note   I, Pamela Booker, connected with  Kattrina Doughty  ([id number], 1996/03/25) on 05/24/23 at  1:30 PM EST by a video-enabled telemedicine application and verified that I am speaking with the correct person using two identifiers.  Location: Patient: Virtual  Visit Location Patient: Home Provider: Virtual Visit Location Provider: Home Office   I discussed the limitations of evaluation and management by telemedicine and the availability of in person appointments. The patient expressed understanding and agreed to proceed.    History of Present Illness: Pamela Booker is a 27 y.o. who identifies as a female who was assigned female at birth, and is being seen today for sinus symptoms and fever.  HPI: 27 y/o F presents to the clinic for c/o sinus pressure, pain, fever, post nasal drainage and sore throat x 1 weeks and progressively getting worse. She was tested negative for strep and covid. Has been taking otc medicine without much relief.     Problems:  Patient Active Problem List   Diagnosis Date Noted   Supervision of high risk pregnancy, antepartum 05/09/2023   History of pre-eclampsia in prior pregnancy, currently pregnant 04/27/2023   Nausea & vomiting 11/23/2022   Anxiety 07/17/2022   Partial epilepsy with impairment of consciousness (HCC) 06/06/2022   Partial symptomatic epilepsy with complex partial seizures, intractable, without status epilepticus (HCC) 06/06/2022   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/04/2022   Major depressive disorder 07/05/2021   Breakthrough seizure (HCC) 07/05/2021   Epilepsy (HCC) 07/05/2021   Migraine headache 07/05/2021   Essential hypertension 07/05/2021   Seizure (HCC) 07/05/2021   Irregular intermenstrual bleeding 06/13/2018   Vaginal dryness 06/06/2018   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   PVC (premature ventricular contraction) 03/11/2018   Endometriosis 03/14/2017   Atypical chest pain 04/25/2016  Palpitations 04/25/2016   Shortness of breath 04/25/2016   Chronic migraine without aura without status migrainosus, not intractable 12/11/2014    Allergies:  Allergies  Allergen Reactions   Nsaids Other (See Comments)    History of bleeding ulcers   Ativan [Lorazepam] Itching   Compazine  [Prochlorperazine]    Diphenhydramine Other (See Comments)    "Crawling feeling under skin"   Ibuprofen     Other reaction(s): GI Upset (intolerance)   Keppra [Levetiracetam] Other (See Comments)    "Makes her feel hungover"    Zolpidem Other (See Comments)    seizure   Emgality [Galcanezumab-Gnlm] Rash   Medications:  Current Outpatient Medications:    amoxicillin-clavulanate (AUGMENTIN) 875-125 MG tablet, Take 1 tablet by mouth 2 (two) times daily for 7 days., Disp: 14 tablet, Rfl: 0   acetaminophen (TYLENOL) 325 MG tablet, Take 650 mg by mouth every 6 (six) hours as needed., Disp: , Rfl:    cyclobenzaprine (FLEXERIL) 10 MG tablet, Take 1 tablet (10 mg total) by mouth 3 (three) times daily as needed., Disp: 30 tablet, Rfl: 0   diazepam (DIASTAT ACUDIAL) 10 MG GEL, Place 10 mg rectally once for 1 dose. (Patient not taking: Reported on 05/15/2023), Disp: 1 each, Rfl: 2   diazepam (VALIUM) 10 MG tablet, Take 1 tablet (10 mg total) by mouth every 6 (six) hours as needed (Seizure/aura)., Disp: 30 tablet, Rfl: 0   escitalopram (LEXAPRO) 10 MG tablet, Take by mouth., Disp: , Rfl:    famotidine (PEPCID) 20 MG tablet, Take 1 tablet (20 mg total) by mouth 2 (two) times daily. (Patient not taking: Reported on 05/15/2023), Disp: 60 tablet, Rfl: 3   Heating Pads PADS, 1 Pad by Does not apply route as needed (cramping)., Disp: , Rfl:    OXcarbazepine (TRILEPTAL) 150 MG tablet, Take 1 tablet (150 mg total) by mouth daily., Disp: 30 tablet, Rfl: 5   valACYclovir (VALTREX) 500 MG tablet, Take 500 mg by mouth daily as needed (Cold sores)., Disp: , Rfl:   Observations/Objective: Patient is well-developed, well-nourished in no acute distress.  Resting comfortably  at home.  Head is normocephalic, atraumatic.  No labored breathing.  Speech is clear and coherent with logical content.  Patient is alert and oriented at baseline.    Assessment and Plan: 1. Rhinosinusitis (Primary) -  amoxicillin-clavulanate (AUGMENTIN) 875-125 MG tablet; Take 1 tablet by mouth 2 (two) times daily for 7 days.  Dispense: 14 tablet; Refill: 0  Increase fluids Take Tylenol as directed on the box. Start medicine as prescribed. Continue to watch for worsening symptoms. Go to UC or ER for worsening symptoms. Pt verbalized understanding and in agreement.    Follow Up Instructions: I discussed the assessment and treatment plan with the patient. The patient was provided an opportunity to ask questions and all were answered. The patient agreed with the plan and demonstrated an understanding of the instructions.  A copy of instructions were sent to the patient via MyChart unless otherwise noted below.   Patient has requested to receive PHI (AVS, Work Notes, etc) pertaining to this video visit through e-mail as they are currently without active MyChart. They have voiced understand that email is not considered secure and their health information could be viewed by someone other than the patient.   The patient was advised to call back or seek an in-person evaluation if the symptoms worsen or if the condition fails to improve as anticipated.    Gilberto Better, PA-C

## 2023-05-24 NOTE — Patient Instructions (Signed)
Pamela Booker, thank you for joining Gilberto Better, PA-C for today's virtual visit.  While this provider is not your primary care provider (PCP), if your PCP is located in our provider database this encounter information will be shared with them immediately following your visit.   A [facility] MyChart account gives you access to today's visit and all your visits, tests, and labs performed at [facility] " click here if you don't have a [facility] MyChart account or go to [email or web address]  Consent: (Patient) Pamela Booker provided verbal consent for this virtual visit at the beginning of the encounter.  Current Medications:  Current Outpatient Medications:    amoxicillin-clavulanate (AUGMENTIN) 875-125 MG tablet, Take 1 tablet by mouth 2 (two) times daily for 7 days., Disp: 14 tablet, Rfl: 0   acetaminophen (TYLENOL) 325 MG tablet, Take 650 mg by mouth every 6 (six) hours as needed., Disp: , Rfl:    cyclobenzaprine (FLEXERIL) 10 MG tablet, Take 1 tablet (10 mg total) by mouth 3 (three) times daily as needed., Disp: 30 tablet, Rfl: 0   diazepam (DIASTAT ACUDIAL) 10 MG GEL, Place 10 mg rectally once for 1 dose. (Patient not taking: Reported on 05/15/2023), Disp: 1 each, Rfl: 2   diazepam (VALIUM) 10 MG tablet, Take 1 tablet (10 mg total) by mouth every 6 (six) hours as needed (Seizure/aura)., Disp: 30 tablet, Rfl: 0   escitalopram (LEXAPRO) 10 MG tablet, Take by mouth., Disp: , Rfl:    famotidine (PEPCID) 20 MG tablet, Take 1 tablet (20 mg total) by mouth 2 (two) times daily. (Patient not taking: Reported on 05/15/2023), Disp: 60 tablet, Rfl: 3   Heating Pads PADS, 1 Pad by Does not apply route as needed (cramping)., Disp: , Rfl:    OXcarbazepine (TRILEPTAL) 150 MG tablet, Take 1 tablet (150 mg total) by mouth daily., Disp: 30 tablet, Rfl: 5   valACYclovir (VALTREX) 500 MG tablet, Take 500 mg by mouth daily as needed (Cold sores)., Disp: , Rfl:     Medications ordered in this encounter:  Meds ordered this encounter  Medications   amoxicillin-clavulanate (AUGMENTIN) 875-125 MG tablet    Sig: Take 1 tablet by mouth 2 (two) times daily for 7 days.    Dispense:  14 tablet    Refill:  0    Supervising Provider:   Merrilee Jansky [[id number]]     *If you need refills on other medications prior to your next appointment, please contact your pharmacy*  Follow-Up: Call back or seek an in-person evaluation if the symptoms worsen or if the condition fails to improve as anticipated.  [facility] [phone]  Other Instructions Increase fluids Take Tylenol as directed on the box. Start medicine as prescribed. Continue to watch for worsening symptoms. Go to UC or ER for worsening symptoms.   If you have been instructed to have an in-person evaluation today at a local Urgent Care facility, please use the link below. It will take you to a list of all of our available [facility], including address, phone number and hours of operation. Please do not delay care.  [facility]  If you or a family member do not have a primary care provider, use the link below to schedule a visit and establish care. When you choose a [facility] primary care physician or advanced practice provider, you gain a long-term partner in health. Find a Primary Care Provider  Learn more about [facility]  in-office and virtual care options: [facility] Get Care Now

## 2023-05-31 ENCOUNTER — Telehealth: Payer: Self-pay

## 2023-05-31 DIAGNOSIS — R109 Unspecified abdominal pain: Secondary | ICD-10-CM | POA: Diagnosis not present

## 2023-05-31 DIAGNOSIS — R1084 Generalized abdominal pain: Secondary | ICD-10-CM | POA: Diagnosis not present

## 2023-05-31 DIAGNOSIS — R0602 Shortness of breath: Secondary | ICD-10-CM | POA: Diagnosis not present

## 2023-05-31 DIAGNOSIS — N200 Calculus of kidney: Secondary | ICD-10-CM | POA: Diagnosis not present

## 2023-05-31 DIAGNOSIS — R3911 Hesitancy of micturition: Secondary | ICD-10-CM | POA: Diagnosis not present

## 2023-05-31 DIAGNOSIS — R000 Tachycardia, unspecified: Secondary | ICD-10-CM | POA: Diagnosis not present

## 2023-05-31 DIAGNOSIS — G40909 Epilepsy, unspecified, not intractable, without status epilepticus: Secondary | ICD-10-CM | POA: Diagnosis not present

## 2023-05-31 DIAGNOSIS — Z888 Allergy status to other drugs, medicaments and biological substances status: Secondary | ICD-10-CM | POA: Diagnosis not present

## 2023-05-31 NOTE — Telephone Encounter (Signed)
Pt called in she is Status post D&E on 01/15/23  Pt stating that she is very uncomfortable stating that she was advised it was possibly constipation, however pt has been having regular bowel movements, Pt also stating that she pain is radiating to her back area near kidneys, she is running a temp 100-100.8 and stating that her abdomen is becoming more swollen.    Spoke with Dr.Pickens who advised me to advise Pt to go to nearest ER.    Pt verbalized understanding.

## 2023-06-01 ENCOUNTER — Encounter: Payer: Self-pay | Admitting: Obstetrics and Gynecology

## 2023-06-02 ENCOUNTER — Encounter: Payer: Self-pay | Admitting: Obstetrics and Gynecology

## 2023-06-02 DIAGNOSIS — F419 Anxiety disorder, unspecified: Secondary | ICD-10-CM | POA: Diagnosis not present

## 2023-06-02 DIAGNOSIS — Z227 Latent tuberculosis: Secondary | ICD-10-CM

## 2023-06-02 DIAGNOSIS — Z888 Allergy status to other drugs, medicaments and biological substances status: Secondary | ICD-10-CM | POA: Diagnosis not present

## 2023-06-02 DIAGNOSIS — R059 Cough, unspecified: Secondary | ICD-10-CM | POA: Diagnosis not present

## 2023-06-02 DIAGNOSIS — R404 Transient alteration of awareness: Secondary | ICD-10-CM | POA: Diagnosis not present

## 2023-06-02 DIAGNOSIS — Z79899 Other long term (current) drug therapy: Secondary | ICD-10-CM | POA: Diagnosis not present

## 2023-06-02 DIAGNOSIS — I10 Essential (primary) hypertension: Secondary | ICD-10-CM | POA: Diagnosis not present

## 2023-06-02 DIAGNOSIS — E162 Hypoglycemia, unspecified: Secondary | ICD-10-CM | POA: Diagnosis not present

## 2023-06-02 DIAGNOSIS — R569 Unspecified convulsions: Secondary | ICD-10-CM | POA: Diagnosis not present

## 2023-06-02 DIAGNOSIS — F32A Depression, unspecified: Secondary | ICD-10-CM | POA: Diagnosis not present

## 2023-06-02 DIAGNOSIS — R55 Syncope and collapse: Secondary | ICD-10-CM | POA: Diagnosis not present

## 2023-06-02 DIAGNOSIS — E161 Other hypoglycemia: Secondary | ICD-10-CM | POA: Diagnosis not present

## 2023-06-02 HISTORY — DX: Latent tuberculosis: Z22.7

## 2023-06-04 ENCOUNTER — Telehealth: Payer: Self-pay | Admitting: Physician Assistant

## 2023-06-04 NOTE — Telephone Encounter (Signed)
Patient called regarding litholink. She had cancelled doing this earlier because she was pregnant. She had a miscarriage and had surgery on 05/17/23. She was advised that she has kidney stones again in her right kidney. She would like to get this taken care of while she is not pregnant (they are going to try again). She asked if she needs to be seen, or do the litholink? Please advise.

## 2023-06-06 NOTE — L&D Delivery Note (Addendum)
 OB/GYN Faculty Practice Delivery Note  Pamela Booker is a 28 y.o. [id number] s/p SVD at [redacted]w[redacted]d. She was admitted for IOL due to prolonged decelerations.   ROM: 8h 39m with clear fluid GBS Status: PRESUMPTIVE NEGATIVE/-- (10/25 1004)   Labor Progress: Initial SVE: 1.5/thick/-3. She then progressed to complete.   Delivery Date/Time: 03/30/24 at 1023  The fetal head was not delivering with typical traction required for delivery of anterior shoulder. No excessive traction was used to facilitate delivery of infant. A shoulder dystocia was called -- called to have extra nursing team and NICU team in room for delivery, and make OR staff aware. The head of the bed was laid flat. Nursing team assisted in flexing both knees  to McRoberts. Additionally, nursing team applied suprapubic pressure from left to right to help facilitate rotation. Still unable to deliver infant so pressure was applied to the posterior aspect of posterior shoulder (Rubin's maneuver) and pressure applied to posterior aspect of anterior shoulder (Woodscrew maneuver) to attempt rotation. At this point anterior shoulder delivered, and then remainder of body delivered with single maternal effort. Cord immediately cut and infant taken over to warmer and received by awaiting NICU team. Cord blood drawn. Placenta delivered spontaneously with gentle cord traction. Fundus firm with massage and Pitocin. Labia, perineum, vagina, and cervix inspected inspected with no lacerations noted.   Infant noted in LOA with left shoulder (posterior shoulder) and right shoulder (anterior shoulder).   Total time from head presentation to delivery of the infant was < 1 minute.   Baby Weight: pending Cord Gas: 7.34 Placenta: Sent to L&D Complications: Shoulder dystocia Lacerations: None EBL: 60 mL Analgesia: Epidural   Infant:  APGAR (1 MIN): 3

## 2023-06-07 ENCOUNTER — Ambulatory Visit (INDEPENDENT_AMBULATORY_CARE_PROVIDER_SITE_OTHER): Payer: 59 | Admitting: Obstetrics and Gynecology

## 2023-06-07 ENCOUNTER — Encounter: Payer: Self-pay | Admitting: Obstetrics and Gynecology

## 2023-06-07 VITALS — BP 125/84 | HR 88 | Wt 163.6 lb

## 2023-06-07 DIAGNOSIS — Z09 Encounter for follow-up examination after completed treatment for conditions other than malignant neoplasm: Secondary | ICD-10-CM

## 2023-06-07 NOTE — Progress Notes (Signed)
 CC: Post up D& E

## 2023-06-07 NOTE — Progress Notes (Signed)
 Obstetrics and Gynecology Visit Return Patient Evaluation  Appointment Date: 06/07/2023  OBGYN Clinic: [facility]   Chief Complaint: routine post op check  History of Present Illness:  Pamela Booker is a 28 y.o. s/p 12/12 suction d&c for missed AB  Interval History: Since that time, she states that she hasn't any bleeding or a period; prior qmonth regular periods. She is still having recurrent seizures.  Final pathology negative  Review of Systems: as noted in the History of Present Illness. 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   Major depressive disorder 07/05/2021   Breakthrough seizure (HCC) 07/05/2021   Epilepsy (HCC) 07/05/2021   Migraine headache 07/05/2021   Essential hypertension 07/05/2021   Seizure (HCC) 07/05/2021   Irregular intermenstrual bleeding 06/13/2018   Vaginal dryness 06/06/2018   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 Current Outpatient Medications  Medication Sig Dispense Refill   acetaminophen  (TYLENOL ) 325 MG tablet Take 650 mg by mouth every 6 (six) hours as needed.     cyclobenzaprine  (FLEXERIL ) 10 MG tablet Take 1 tablet  (10 mg total) by mouth 3 (three) times daily as needed. 30 tablet 0   diazepam  (VALIUM ) 10 MG tablet Take 1 tablet (10 mg total) by mouth every 6 (six) hours as needed (Seizure/aura). 30 tablet 0   escitalopram  (LEXAPRO ) 10 MG tablet Take by mouth.     Heating Pads PADS 1 Pad by Does not apply route as needed (cramping).     OXcarbazepine  (TRILEPTAL ) 150 MG tablet Take 1 tablet (150 mg total) by mouth daily. 30 tablet 5   valACYclovir  (VALTREX ) 500 MG tablet Take 500 mg by mouth daily as needed (Cold sores).     diazepam  (DIASTAT  ACUDIAL) 10 MG GEL Place 10 mg rectally once for 1 dose. (Patient not taking: Reported on 05/15/2023) 1 each 2   famotidine  (PEPCID ) 20 MG tablet Take 1 tablet (20 mg total) by mouth 2 (two) times daily. (Patient not taking: Reported on 05/15/2023) 60 tablet 3   No current facility-administered medications for this visit.    Allergies: is allergic to nsaids, ativan  [lorazepam ], compazine  [prochlorperazine ], diphenhydramine , ibuprofen, keppra  [levetiracetam ], zolpidem, and emgality [galcanezumab-gnlm].  Physical Exam:  BP 125/84   Pulse 88   Wt 163 lb 9.6 oz (74.2 kg)   LMP 03/02/2023 (Exact Date)   BMI 29.92 kg/m  Body mass index is 29.92 kg/m. General appearance: Well nourished, well developed female in no acute distress.  Neuro/Psych:  Normal mood and affect.     Assessment: patient stable  Plan:  1. Postop check (Primary) No issues. I told her that periods should resume by the end of this month and if not, to let me know  2. Seizures I told to contact her neurologist asap. She states that  she was xcopri  and seizure free for several months prior to pregnancy and then switched to trileptal  after starting pregnancy and seizures not severe but since d&c she's had recurrent seizures.  I also told her that I do not recommend future pregnancies until her neurology situation is stable and even then, to contact us  to see MFM before trying to conceive as risks  of pregnancy are high and I told her I definitely don't recommend future pregnancies in her current situation  Patient states she can't do hormone birth control and will go back on phexxi; she states that she has plenty at home. I also d/w her re: paragard.    RTC: PRN  Bebe Izell Raddle MD Attending Center [facility])

## 2023-06-11 DIAGNOSIS — G894 Chronic pain syndrome: Secondary | ICD-10-CM | POA: Insufficient documentation

## 2023-06-11 HISTORY — DX: Chronic pain syndrome: G89.4

## 2023-06-13 ENCOUNTER — Encounter: Payer: 59 | Admitting: Obstetrics and Gynecology

## 2023-06-13 NOTE — Telephone Encounter (Signed)
 Patient advised, patient has urine cup for collection. She will call us back when she is ready to set up lab appointment and follow up appointment for results at that time also.

## 2023-06-14 ENCOUNTER — Ambulatory Visit: Payer: 59

## 2023-06-18 ENCOUNTER — Emergency Department
Admission: EM | Admit: 2023-06-18 | Discharge: 2023-06-19 | Disposition: A | Discharge status: Home / Self Care | Payer: Commercial Managed Care - PPO | Attending: Emergency Medicine | Admitting: Emergency Medicine

## 2023-06-18 ENCOUNTER — Emergency Department: Payer: Commercial Managed Care - PPO

## 2023-06-18 DIAGNOSIS — I10 Essential (primary) hypertension: Secondary | ICD-10-CM | POA: Diagnosis not present

## 2023-06-18 DIAGNOSIS — R569 Unspecified convulsions: Secondary | ICD-10-CM | POA: Insufficient documentation

## 2023-06-18 LAB — COMPREHENSIVE METABOLIC PANEL
ALT: 13 U/L (ref 0–44)
AST: 23 U/L (ref 15–41)
Albumin: 4.6 g/dL (ref 3.5–5.0)
Alkaline Phosphatase: 62 U/L (ref 38–126)
Anion gap: 12 (ref 5–15)
BUN: 6 mg/dL (ref 6–20)
CO2: 25 mmol/L (ref 22–32)
Calcium: 9.2 mg/dL (ref 8.9–10.3)
Chloride: 102 mmol/L (ref 98–111)
Creatinine, Ser: 0.73 mg/dL (ref 0.44–1.00)
GFR, Estimated: >60 mL/min (ref >60)
Glucose, Bld: 68 mg/dL — ABNORMAL LOW (ref 70–99)
Potassium: 4 mmol/L (ref 3.5–5.1)
Sodium: 139 mmol/L (ref 135–145)
Total Bilirubin: 0.6 mg/dL (ref 0.0–1.2)
Total Protein: 7.2 g/dL (ref 6.5–8.1)

## 2023-06-18 LAB — CBC WITH DIFFERENTIAL/PLATELET
Abs Immature Granulocytes: 0.02 10*3/uL (ref 0.00–0.07)
Basophils Absolute: 0.1 10*3/uL (ref 0.0–0.1)
Basophils Relative: 1 %
Eosinophils Absolute: 0.1 10*3/uL (ref 0.0–0.5)
Eosinophils Relative: 1 %
HCT: 41.9 % (ref 36.0–46.0)
Hemoglobin: 14.3 g/dL (ref 12.0–15.0)
Immature Granulocytes: 0 %
Lymphocytes Relative: 45 %
Lymphs Abs: 3.6 10*3/uL (ref 0.7–4.0)
MCH: 33.3 pg (ref 26.0–34.0)
MCHC: 34.1 g/dL (ref 30.0–36.0)
MCV: 97.4 fL (ref 80.0–100.0)
Monocytes Absolute: 0.5 10*3/uL (ref 0.1–1.0)
Monocytes Relative: 7 %
Neutro Abs: 3.7 10*3/uL (ref 1.7–7.7)
Neutrophils Relative %: 46 %
Platelets: 273 10*3/uL (ref 150–400)
RBC: 4.3 MIL/uL (ref 3.87–5.11)
RDW: 12.8 % (ref 11.5–15.5)
WBC: 8 10*3/uL (ref 4.0–10.5)
nRBC: 0 % (ref 0.0–0.2)

## 2023-06-18 LAB — HCG, QUANTITATIVE, PREGNANCY: hCG, Beta Chain, Quant, S: 2 m[IU]/mL (ref <5)

## 2023-06-18 MED ORDER — ONDANSETRON HCL 4 MG/2ML IJ SOLN
4.0000 mg | Freq: Once | INTRAMUSCULAR | Status: AC
  Administered 2023-06-18: 4 mg via INTRAVENOUS
  Filled 2023-06-18: qty 2

## 2023-06-18 MED ORDER — LORAZEPAM 2 MG/ML IJ SOLN
INTRAMUSCULAR | Status: AC
  Administered 2023-06-18: 2 mg via INTRAVENOUS
  Filled 2023-06-18: qty 2

## 2023-06-18 MED ORDER — LORAZEPAM 2 MG/ML IJ SOLN: 2.0000 mg | Freq: Once | INTRAMUSCULAR | Status: AC

## 2023-06-18 MED ORDER — METOCLOPRAMIDE HCL 5 MG/ML IJ SOLN
10.0000 mg | Freq: Once | INTRAMUSCULAR | Status: AC
  Administered 2023-06-18: 10 mg via INTRAVENOUS
  Filled 2023-06-18: qty 2

## 2023-06-18 NOTE — ED Provider Notes (Signed)
 [facility] Provider Note    Event Date/Time   First MD Initiated Contact with Patient 06/18/23 1959     (approximate)  History   Chief Complaint: Seizures  HPI  Pamela Booker is a 28 y.o. female with a past medical history of anxiety, gastric reflux, hypertension, seizures, vagus nerve stimulator, presents to the emergency department for reported seizure.  Patient is brought in by EMS for 5 seizures reported earlier today.  Per EMS they state they were told the seizures are brought on by stress the patient recently had a miscarriage leading to increased stress.  On arrival patient able to follow simple commands.  Shortly after arrival patient began having tonic-clonic activity once again was given 2 mg of Ativan .  In route to the hospital patient was given Versed  by EMS.  Physical Exam   Triage Vital Signs: ED Triage Vitals [06/18/23 1959]  Encounter Vitals Group     BP 121/83     Systolic BP Percentile      Diastolic BP Percentile      Pulse Rate 76     Resp (!) 21     Temp 98.7 F (37.1 C)     Temp Source Axillary     SpO2 100 %     Weight      Height      Head Circumference      Peak Flow      Pain Score      Pain Loc      Pain Education      Exclude from Growth Chart     Most recent vital signs: Vitals:   06/18/23 1959  BP: 121/83  Pulse: 76  Resp: (!) 21  Temp: 98.7 F (37.1 C)  SpO2: 100%    General: Awake, no distress.  CV:  Good peripheral perfusion.  Regular rate and rhythm  Resp:  Normal effort.  Equal breath sounds bilaterally.  Abd:  No distention.  Soft, nontender.  No rebound or guarding.  ED Results / Procedures / Treatments   RADIOLOGY  I have reviewed and interpreted CT head images.  No bleed seen on my evaluation. Radiology is read the head CT is negative for acute abnormality.   MEDICATIONS ORDERED IN ED: Medications  LORazepam  (ATIVAN ) injection 2 mg (2 mg Intravenous Given 06/18/23 2001)      IMPRESSION / MDM / ASSESSMENT AND PLAN / ED COURSE  I reviewed the triage vital signs and the nursing notes.  Patient's presentation is most consistent with acute presentation with potential threat to life or bodily function.  Patient presents to the emergency department for seizure activity reported at home, per EMS they were told this was brought on by stress.  Interviewing the patient's chart I last see a neurology note in 2017.  Shortly after arrival to the emergency department patient had a tonic-clonic appearing seizure was given 2 mg Ativan  IV.  Patient is now postictal no continued seizure activity.  Corneal reflexes intact.  ----------------------------------------- 10:22 PM on 06/18/2023 ----------------------------------------- Patient is now awake she is somnolent will awaken to voice and is asking for something to drink.  Patient's workup is reassuring CBC is normal, chemistry is normal pregnancy test is negative.  CT scan of the head is negative.  We will closely monitor the patient did receive Versed  by EMS and Ativan  Bialas.  Once the patient is awake alert able to eat and drink we will discharge home with outpatient follow-up.  Patient  is agreeable to this plan.  Husband is here and he is agreeable as well.  Patient still somnolent.  Will continue to closely monitor.  Patient care signed out to oncoming provider with plan to discharge once the patient is awake and alert.  FINAL CLINICAL IMPRESSION(S) / ED DIAGNOSES   Seizure-like activity   Note:  This document was prepared using Dragon voice recognition software and may include unintentional dictation errors.   Dorothyann Drivers, MD 06/18/23 2321

## 2023-06-18 NOTE — ED Notes (Signed)
 Pt had witnessed seizure by ER staff. Provider notified.

## 2023-06-18 NOTE — Discharge Instructions (Signed)
 Please follow-up with your doctor.  I have placed a referral to neurology please call neurology to arrange an appointment if you do not have a current neurologist.  Return to the emergency department for any symptom personally concerning to yourself.

## 2023-06-18 NOTE — ED Notes (Signed)
 Drinking orange juice PO challenge successful

## 2023-06-18 NOTE — ED Notes (Signed)
 Family  continues to move in and out of room 9 while educated on remaining in a place pt family continues to request soda and bed bath pt has saliva on face and has requested to be dc to home Presenter, broadcasting has secure chat ed physician requesting antiemetic

## 2023-06-18 NOTE — ED Triage Notes (Signed)
 BIBA from home for c/o seizures.  Pt has had 5 seizures today.  Pt has pmh of seizures, they are brought on by stress, pt had recent miscarriage.  Pt postictal on arrival to ED but able to follow simple commands. Pt given versed 10mg  PTA.

## 2023-06-19 LAB — CBG MONITORING, ED: Glucose-Capillary: 99 mg/dL (ref 70–99)

## 2023-06-19 NOTE — ED Provider Notes (Signed)
 Much more wakeful, both patient and spouse eager to go home at this time.  Repeat blood sugar is within normal limits.  Plan is for discharge with close PMD follow-up.   Pilar Jarvis, MD 06/19/23 [phone]

## 2023-06-19 NOTE — ED Notes (Signed)
 Pt aox4 appears drowsy denies further needs @ this time pt reports nausea is resolved pt to follow up with neurologist pt skin appears wdi pink non labored resps noted pt stands without assistance

## 2023-06-19 NOTE — ED Notes (Signed)
 Bg poc 99

## 2023-06-27 ENCOUNTER — Encounter: Payer: Self-pay | Admitting: Family Medicine

## 2023-07-11 ENCOUNTER — Telehealth: Payer: Self-pay

## 2023-07-11 DIAGNOSIS — Z0289 Encounter for other administrative examinations: Secondary | ICD-10-CM

## 2023-07-11 NOTE — Telephone Encounter (Signed)
 Pt called requesting to have her FMLA paperwork completed by Friday.  She would like to follow up.  Judianne Seiple, RN  07/11/23

## 2023-07-22 ENCOUNTER — Telehealth: Payer: Commercial Managed Care - PPO | Admitting: Family Medicine

## 2023-07-22 DIAGNOSIS — N760 Acute vaginitis: Secondary | ICD-10-CM

## 2023-07-22 DIAGNOSIS — B9689 Other specified bacterial agents as the cause of diseases classified elsewhere: Secondary | ICD-10-CM | POA: Diagnosis not present

## 2023-07-22 MED ORDER — METRONIDAZOLE 500 MG PO TABS: 500.0000 mg | ORAL_TABLET | Freq: Three times a day (TID) | ORAL | 0 refills | Status: AC

## 2023-07-22 NOTE — Patient Instructions (Signed)

## 2023-07-22 NOTE — Progress Notes (Signed)
Virtual Visit Consent   [name], you are scheduled for a virtual visit with a [facility] provider today. Just as with appointments in the office, your consent must be obtained to participate. Your consent will be active for this visit and any virtual visit you may have with one of our providers in the next 365 days. If you have a MyChart account, a copy of this consent can be sent to you electronically.  As this is a virtual visit, video technology does not allow for your provider to perform a traditional examination. This may limit your provider's ability to fully assess your condition. If your provider identifies any concerns that need to be evaluated in person or the need to arrange testing (such as labs, EKG, etc.), we will make arrangements to do so. Although advances in technology are sophisticated, we cannot ensure that it will always work on either your end or our end. If the connection with a video visit is poor, the visit may have to be switched to a telephone visit. With either a video or telephone visit, we are not always able to ensure that we have a secure connection.  By engaging in this virtual visit, you consent to the provision of healthcare and authorize for your insurance to be billed (if applicable) for the services provided during this visit. Depending on your insurance coverage, you may receive a charge related to this service.  I need to obtain your verbal consent now. Are you willing to proceed with your visit today? Darion Juhasz has provided verbal consent on 07/22/2023 for a virtual visit (video or telephone). Georgana Curio, FNP  Date: 07/22/2023 2:12 PM   Virtual Visit via Video Note   I, Georgana Curio, connected with  Pamela Booker [place]  ([id number], 01/14/1996) on 07/22/23 at  2:15 PM EST by a video-enabled telemedicine application and verified that I am speaking with the correct person using two identifiers.  Location: Patient: Virtual Visit  Location Patient: Home Provider: Virtual Visit Location Provider: Home Office   I discussed the limitations of evaluation and management by telemedicine and the availability of in person appointments. The patient expressed understanding and agreed to proceed.    History of Present Illness: Pamela Booker is a 28 y.o. who identifies as a female who was assigned female at birth, and is being seen today for discharge thin with foul odor. History of BV and has it again per patient. She is not pregnant. 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   Major depressive disorder 07/05/2021   Breakthrough seizure (HCC) 07/05/2021   Epilepsy (HCC) 07/05/2021   Migraine headache 07/05/2021   Essential hypertension 07/05/2021   Seizure (HCC) 07/05/2021   Irregular intermenstrual bleeding 06/13/2018   Vaginal dryness 06/06/2018   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   PVC (premature ventricular contraction) 03/11/2018   Endometriosis 03/14/2017   Atypical chest pain 04/25/2016   Palpitations 04/25/2016   Shortness of breath 04/25/2016   Chronic migraine without aura without status migrainosus,  not intractable 12/11/2014    Allergies:  Allergies  Allergen Reactions   Nsaids Other (See Comments)    History of bleeding ulcers   Ativan [Lorazepam] Itching   Compazine [Prochlorperazine]    Diphenhydramine Other (See Comments)    "Crawling feeling under skin"   Ibuprofen     Other reaction(s):  GI Upset (intolerance)   Keppra [Levetiracetam] Other (See Comments)    "Makes her feel hungover"    Zolpidem Other (See Comments)    seizure   Emgality [Galcanezumab-Gnlm] Rash   Medications:  Current Outpatient Medications:    metroNIDAZOLE (FLAGYL) 500 MG tablet, Take 1 tablet (500 mg total) by mouth 3 (three) times daily for 7 days., Disp: 21 tablet, Rfl: 0   acetaminophen (TYLENOL) 325 MG tablet, Take 650 mg by mouth every 6 (six) hours as needed., Disp: , Rfl:    cyclobenzaprine (FLEXERIL) 10 MG tablet, Take 1 tablet (10 mg total) by mouth 3 (three) times daily as needed., Disp: 30 tablet, Rfl: 0   diazepam (DIASTAT ACUDIAL) 10 MG GEL, Place 10 mg rectally once for 1 dose. (Patient not taking: Reported on 05/15/2023), Disp: 1 each, Rfl: 2   diazepam (VALIUM) 10 MG tablet, Take 1 tablet (10 mg total) by mouth every 6 (six) hours as needed (Seizure/aura)., Disp: 30 tablet, Rfl: 0   famotidine (PEPCID) 20 MG tablet, Take 1 tablet (20 mg total) by mouth 2 (two) times daily. (Patient not taking: Reported on 05/15/2023), Disp: 60 tablet, Rfl: 3   Heating Pads PADS, 1 Pad by Does not apply route as needed (cramping)., Disp: , Rfl:    OXcarbazepine (TRILEPTAL) 150 MG tablet, Take 1 tablet (150 mg total) by mouth daily., Disp: 30 tablet, Rfl: 5   valACYclovir (VALTREX) 500 MG tablet, Take 500 mg by mouth daily as needed (Cold sores)., Disp: , Rfl:   Observations/Objective: Patient is well-developed, well-nourished in no acute distress.  Resting comfortably  at home.  Head is normocephalic, atraumatic.  No labored breathing.  Speech is clear and coherent with logical content.  Patient is alert and oriented at baseline.    Assessment and Plan: 1. Bacterial vaginitis (Primary)  Follow up at urgent care as needed.   Follow Up Instructions: I discussed the assessment and treatment plan with the patient. The patient was provided an opportunity to ask questions and all were answered. The  patient agreed with the plan and demonstrated an understanding of the instructions.  A copy of instructions were sent to the patient via MyChart unless otherwise noted below.     The patient was advised to call back or seek an in-person evaluation if the symptoms worsen or if the condition fails to improve as anticipated.    Georgana Curio, FNP

## 2023-07-26 ENCOUNTER — Ambulatory Visit: Payer: 59

## 2023-08-20 DIAGNOSIS — Z3169 Encounter for other general counseling and advice on procreation: Secondary | ICD-10-CM

## 2023-08-20 HISTORY — DX: Encounter for other general counseling and advice on procreation: Z31.69

## 2023-08-30 ENCOUNTER — Encounter: Payer: Self-pay | Admitting: Family Medicine

## 2023-09-04 ENCOUNTER — Inpatient Hospital Stay (HOSPITAL_COMMUNITY)
Admission: AD | Admit: 2023-09-04 | Discharge: 2023-09-04 | Disposition: A | Discharge status: Home / Self Care | Attending: Obstetrics and Gynecology | Admitting: Obstetrics and Gynecology

## 2023-09-04 ENCOUNTER — Inpatient Hospital Stay (HOSPITAL_COMMUNITY)

## 2023-09-04 ENCOUNTER — Encounter (HOSPITAL_COMMUNITY): Payer: Self-pay

## 2023-09-04 DIAGNOSIS — R102 Pelvic and perineal pain: Secondary | ICD-10-CM | POA: Insufficient documentation

## 2023-09-04 DIAGNOSIS — O26891 Other specified pregnancy related conditions, first trimester: Secondary | ICD-10-CM | POA: Insufficient documentation

## 2023-09-04 DIAGNOSIS — G40909 Epilepsy, unspecified, not intractable, without status epilepticus: Secondary | ICD-10-CM | POA: Insufficient documentation

## 2023-09-04 DIAGNOSIS — O99351 Diseases of the nervous system complicating pregnancy, first trimester: Secondary | ICD-10-CM | POA: Diagnosis not present

## 2023-09-04 DIAGNOSIS — N8312 Corpus luteum cyst of left ovary: Secondary | ICD-10-CM | POA: Diagnosis not present

## 2023-09-04 DIAGNOSIS — O0990 Supervision of high risk pregnancy, unspecified, unspecified trimester: Secondary | ICD-10-CM

## 2023-09-04 DIAGNOSIS — R1032 Left lower quadrant pain: Secondary | ICD-10-CM | POA: Diagnosis not present

## 2023-09-04 DIAGNOSIS — Z3A01 Less than 8 weeks gestation of pregnancy: Secondary | ICD-10-CM | POA: Diagnosis not present

## 2023-09-04 DIAGNOSIS — M5430 Sciatica, unspecified side: Secondary | ICD-10-CM | POA: Insufficient documentation

## 2023-09-04 DIAGNOSIS — O0991 Supervision of high risk pregnancy, unspecified, first trimester: Secondary | ICD-10-CM | POA: Diagnosis not present

## 2023-09-04 DIAGNOSIS — O3481 Maternal care for other abnormalities of pelvic organs, first trimester: Secondary | ICD-10-CM | POA: Insufficient documentation

## 2023-09-04 LAB — WET PREP, GENITAL
Sperm: NONE SEEN
Trich, Wet Prep: NONE SEEN
WBC, Wet Prep HPF POC: >=10 — AB (ref <10)
Yeast Wet Prep HPF POC: NONE SEEN

## 2023-09-04 LAB — COMPREHENSIVE METABOLIC PANEL WITH GFR
ALT: 19 U/L (ref 0–44)
AST: 24 U/L (ref 15–41)
Albumin: 4.6 g/dL (ref 3.5–5.0)
Alkaline Phosphatase: 60 U/L (ref 38–126)
Anion gap: 12 (ref 5–15)
BUN: 8 mg/dL (ref 6–20)
CO2: 21 mmol/L — ABNORMAL LOW (ref 22–32)
Calcium: 10.2 mg/dL (ref 8.9–10.3)
Chloride: 103 mmol/L (ref 98–111)
Creatinine, Ser: 0.82 mg/dL (ref 0.44–1.00)
GFR, Estimated: >60 mL/min (ref >60)
Glucose, Bld: 114 mg/dL — ABNORMAL HIGH (ref 70–99)
Potassium: 3.4 mmol/L — ABNORMAL LOW (ref 3.5–5.1)
Sodium: 136 mmol/L (ref 135–145)
Total Bilirubin: 0.4 mg/dL (ref 0.0–1.2)
Total Protein: 7.2 g/dL (ref 6.5–8.1)

## 2023-09-04 LAB — HCG, QUANTITATIVE, PREGNANCY: hCG, Beta Chain, Quant, S: 26622 m[IU]/mL — ABNORMAL HIGH (ref <5)

## 2023-09-04 LAB — CBC
HCT: 44.3 % (ref 36.0–46.0)
Hemoglobin: 15.5 g/dL — ABNORMAL HIGH (ref 12.0–15.0)
MCH: 33 pg (ref 26.0–34.0)
MCHC: 35 g/dL (ref 30.0–36.0)
MCV: 94.3 fL (ref 80.0–100.0)
Platelets: 324 10*3/uL (ref 150–400)
RBC: 4.7 MIL/uL (ref 3.87–5.11)
RDW: 12.3 % (ref 11.5–15.5)
WBC: 12.4 10*3/uL — ABNORMAL HIGH (ref 4.0–10.5)
nRBC: 0 % (ref 0.0–0.2)

## 2023-09-04 LAB — GLUCOSE, CAPILLARY: Glucose-Capillary: 113 mg/dL — ABNORMAL HIGH (ref 70–99)

## 2023-09-04 LAB — CK: Total CK: 37 U/L — ABNORMAL LOW (ref 38–234)

## 2023-09-04 MED ORDER — ACETAMINOPHEN 500 MG PO TABS
1000.0000 mg | ORAL_TABLET | Freq: Once | ORAL | Status: AC
  Administered 2023-09-04: 1000 mg via ORAL
  Filled 2023-09-04: qty 2

## 2023-09-04 MED ORDER — OXCARBAZEPINE 150 MG PO TABS: 150.0000 mg | ORAL_TABLET | Freq: Every day | ORAL | 5 refills | Status: DC

## 2023-09-04 NOTE — MAU Note (Addendum)
.  Pamela Booker is a 28 y.o. at [redacted]w[redacted]d here in MAU reporting: MAU Registration called the unit to notify that patient was actively seizing in the lobby. Dr. Alvester Morin, MD, notified immediately. This RN, Spurlock-Frizzel, RN, and Morris, RN presented to [employer]. Patient transferred to stretcher. Patient's support person stated she began seizing 1-2 minutes prior. Patient placed in right lateral position and brought to room 130. Dr. Alvester Morin at patients bedside immediately. First BP 164/63 with pulse of 147. Patient stopped seizing at 1900. CBG 113 mg/dL.  This RN placed 18 gauge IV in right AC. Patient able to nod her head yes to questions and stated her first name.   Husband, Thayer Ohm, brought to room. Husband stated patient has a vagus nerve stimulator that he engaged in the lobby. Patient a known epileptic and around 5-[redacted] weeks pregnant. Patient presented to MAU initially due to left sided pelvic pain.   Hcg 3/24 550 Hcg 3/21 207  LMP: 07/27/2023 Onset of complaint: Today  Lab orders placed from triage: verbal orders placed per MD

## 2023-09-04 NOTE — MAU Provider Note (Signed)
 Chief Complaint: Pelvic Pain   Event Date/Time   First Provider Initiated Contact with Patient 09/04/23 1916        SUBJECTIVE (pt seen by Dr Alvester Morin, note written by me) HPI: Pamela Booker is a 28 y.o. [phone] at [redacted]w[redacted]d by LMP who presents to maternity admissions reporting Left lower quadrant pain.  Was initially brought to room by Dr Alvester Morin with a Level 5 Caveat due to active seizure.  Patient has a history of poorly controlled seizures and has a vagal stimulator.  Her husband activated it which stopped the seizure.  She was incontinent of urine.  She recovered well.  States has a Neurology appointment this Thursday.  Later she described LLQ pain this week. Marland KitchenHas been followed by REI for this pregnancy.  Was planning in intervention when she got pregnant spontaneously.  They have been following HCGs which have been doubling.  [place] planned next week. Has not had one yet.  She denies vaginal bleeding, urinary symptoms, or fever/chills.    Pelvic Pain The patient's primary symptoms include pelvic pain. The patient's pertinent negatives include no vaginal bleeding. The problem affects the left side. She is pregnant. Associated symptoms include back pain (ongoing sciatica). Pertinent negatives include no constipation, diarrhea, dysuria or frequency. Nothing aggravates the symptoms. She has tried nothing for the symptoms.   RN Note: .Pamela Booker is a 28 y.o. at [redacted]w[redacted]d here in MAU reporting: MAU Registration called the unit to notify that patient was actively seizing in the lobby. Dr. Alvester Morin, MD, notified immediately. This RN, Spurlock-Frizzel, RN, and Morris, RN presented to [facility]. Patient transferred to stretcher. Patient's support person stated she began seizing 1-2 minutes prior. Patient placed in right lateral position and brought to room 130. Dr. Alvester Morin at patients bedside immediately. First BP 164/63 with pulse of 147. Patient stopped seizing at 1900. CBG 113 mg/dL.   Past  Medical History:  Diagnosis Date   Anxiety    Bilateral nephrolithiasis    Depression    Endometriosis    Epilepsy (HCC)    GERD (gastroesophageal reflux disease)    History of kidney stones    HSV-2 infection    Hypertension    Inactive tuberculosis    Migraines    Nausea & vomiting 11/23/2022   Ovarian cyst    Palpitations    PVC (premature ventricular contraction)    Seizures (HCC)    last seizure was 2 weeks ago (app.12-18-22)   Status post placement of VNS (vagus nerve stimulation) device    Syncope and collapse 06/10/2019   Thyroid nodule    Past Surgical History:  Procedure Laterality Date   CYSTOSCOPY W/ URETERAL STENT REMOVAL Left 01/09/2023   Procedure: CYSTOSCOPY WITH STENT REMOVAL;  Surgeon: Riki Altes, MD;  Location: [facility];  Service: Urology;  Laterality: Left;   CYSTOSCOPY/URETEROSCOPY/HOLMIUM LASER/STENT PLACEMENT Left 01/02/2023   Procedure: CYSTOSCOPY/URETEROSCOPY/HOLMIUM LASER/STENT PLACEMENT;  Surgeon: Riki Altes, MD;  Location: [facility];  Service: Urology;  Laterality: Left;   CYSTOSCOPY/URETEROSCOPY/HOLMIUM LASER/STENT PLACEMENT Right 01/09/2023   Procedure: CYSTOSCOPY/URETEROSCOPY/HOLMIUM LASER/STENT PLACEMENT;  Surgeon: Riki Altes, MD;  Location: [facility];  Service: Urology;  Laterality: Right;   DILATION AND EVACUATION N/A 05/17/2023   Procedure: DILATATION AND EVACUATION;  Surgeon: Tara Hills Bing, MD;  Location: [facility];  Service: Gynecology;  Laterality: N/A;   ENDOMETRIAL ABLATION  06/2022   [facility]   LAPAROSCOPY     x5 for endometriosis   LOOP RECORDER INSERTION  2020   LOOP  RECORDER REMOVAL  2021   TONSILLECTOMY     URETEROSCOPY Left 01/09/2023   Procedure: URETEROSCOPY;  Surgeon: Riki Altes, MD;  Location: [facility];  Service: Urology;  Laterality: Left;   VAGUS NERVE STIMULATOR INSERTION  2022   Social History   Socioeconomic History   Marital status: Married    Spouse name: Not on file   Number of children: 1   Years  of education: Not on file   Highest education level: Some college, no degree  Occupational History   Not on file  Tobacco Use   Smoking status: Never   Smokeless tobacco: Never  Vaping Use   Vaping status: Former   Substances: Nicotine  Substance and Sexual Activity   Alcohol use: Not Currently    Comment: socially   Drug use: Not Currently    Comment: THC   Sexual activity: Yes  Other Topics Concern   Not on file  Social History Narrative   Pt lives alone with daughter in 2 story home   Right handed   Pt drinks coffee everyday, sometime tea at night, soda-coke zero not often   exercise 3-4 times a week          Social Drivers of Corporate investment banker Strain: Not on file  Food Insecurity: No Food Insecurity (05/09/2023)   Hunger Vital Sign    Worried About Running Out of Food in the Last Year: Never true    Ran Out of Food in the Last Year: Never true  Transportation Needs: No Transportation Needs (05/09/2023)   PRAPARE - Administrator, Civil Service (Medical): No    Lack of Transportation (Non-Medical): No  Physical Activity: Not on file  Stress: Not on file  Social Connections: Not on file  Intimate Partner Violence: Not on file   No current facility-administered medications on file prior to encounter.   Current Outpatient Medications on File Prior to Encounter  Medication Sig Dispense Refill   acetaminophen (TYLENOL) 325 MG tablet Take 650 mg by mouth every 6 (six) hours as needed.     cyclobenzaprine (FLEXERIL) 10 MG tablet Take 1 tablet (10 mg total) by mouth 3 (three) times daily as needed. 30 tablet 0   diazepam (DIASTAT ACUDIAL) 10 MG GEL Place 10 mg rectally once for 1 dose. (Patient not taking: Reported on 05/15/2023) 1 each 2   diazepam (VALIUM) 10 MG tablet Take 1 tablet (10 mg total) by mouth every 6 (six) hours as needed (Seizure/aura). 30 tablet 0   famotidine (PEPCID) 20 MG tablet Take 1 tablet (20 mg total) by mouth 2 (two) times  daily. (Patient not taking: Reported on 05/15/2023) 60 tablet 3   Heating Pads PADS 1 Pad by Does not apply route as needed (cramping).     valACYclovir (VALTREX) 500 MG tablet Take 500 mg by mouth daily as needed (Cold sores).     Allergies  Allergen Reactions   Nsaids Other (See Comments)    History of bleeding ulcers   Ativan [Lorazepam] Itching   Compazine [Prochlorperazine]    Diphenhydramine Other (See Comments)    "Crawling feeling under skin"   Ibuprofen     Other reaction(s): GI Upset (intolerance)   Keppra [Levetiracetam] Other (See Comments)    "Makes her feel hungover"    Zolpidem Other (See Comments)    seizure   Emgality [Galcanezumab-Gnlm] Rash    I have reviewed patient's Past Medical Hx, Surgical Hx, Family Hx, Social Hx, medications  and allergies.   ROS (initially Level 5 Caveat, H/ROS done later):  Review of Systems  Gastrointestinal:  Negative for constipation and diarrhea.  Genitourinary:  Positive for pelvic pain. Negative for dysuria and frequency.  Musculoskeletal:  Positive for back pain (ongoing sciatica).   Review of Systems  Other systems negative   Physical Exam  Physical Exam Patient Vitals for the past 24 hrs:  BP Temp Temp src Pulse Resp SpO2  09/04/23 2018 110/62 98.1 F (36.7 C) Oral 88 17 --  09/04/23 2015 110/62 -- -- 88 -- 98 %  09/04/23 1905 134/85 -- -- (!) 125 -- --  09/04/23 1903 (!) 130/111 -- -- (!) 177 -- 100 %  09/04/23 1858 (!) 164/68 -- -- (!) 147 -- 100 %   Constitutional: Well-developed, well-nourished female in no acute distress.  Cardiovascular: normal rate Respiratory: normal effort GI: Abd soft, non-tender.  MS: Extremities nontender, no edema, normal ROM Neurologic: Alert and oriented x 4.  GU: Neg CVAT.  PELVIC EXAM: deferred in lieu of transvaginal ultrasound  LAB RESULTS Results for orders placed or performed during the hospital encounter of 09/04/23 (from the past 24 hours)  Glucose, capillary      Status: Abnormal   Collection Time: 09/04/23  7:03 PM  Result Value Ref Range   Glucose-Capillary 113 (H) 70 - 99 mg/dL  CK     Status: Abnormal   Collection Time: 09/04/23  7:15 PM  Result Value Ref Range   Total CK 37 (L) 38 - 234 U/L  hCG, quantitative, pregnancy     Status: Abnormal   Collection Time: 09/04/23  7:15 PM  Result Value Ref Range   hCG, Beta Chain, Quant, S 09,811 (H) <5 mIU/mL  Comprehensive metabolic panel     Status: Abnormal   Collection Time: 09/04/23  7:15 PM  Result Value Ref Range   Sodium 136 135 - 145 mmol/L   Potassium 3.4 (L) 3.5 - 5.1 mmol/L   Chloride 103 98 - 111 mmol/L   CO2 21 (L) 22 - 32 mmol/L   Glucose, Bld 114 (H) 70 - 99 mg/dL   BUN 8 6 - 20 mg/dL   Creatinine, Ser 9.14 0.44 - 1.00 mg/dL   Calcium 78.2 8.9 - 95.6 mg/dL   Total Protein 7.2 6.5 - 8.1 g/dL   Albumin 4.6 3.5 - 5.0 g/dL   AST 24 15 - 41 U/L   ALT 19 0 - 44 U/L   Alkaline Phosphatase 60 38 - 126 U/L   Total Bilirubin 0.4 0.0 - 1.2 mg/dL   GFR, Estimated >21 >30 mL/min   Anion gap 12 5 - 15  CBC     Status: Abnormal   Collection Time: 09/04/23  7:15 PM  Result Value Ref Range   WBC 12.4 (H) 4.0 - 10.5 K/uL   RBC 4.70 3.87 - 5.11 MIL/uL   Hemoglobin 15.5 (H) 12.0 - 15.0 g/dL   HCT 86.5 78.4 - 69.6 %   MCV 94.3 80.0 - 100.0 fL   MCH 33.0 26.0 - 34.0 pg   MCHC 35.0 30.0 - 36.0 g/dL   RDW 29.5 28.4 - 13.2 %   Platelets 324 150 - 400 K/uL   nRBC 0.0 0.0 - 0.2 %  Wet prep, genital     Status: Abnormal   Collection Time: 09/04/23  8:25 PM   Specimen: PATH Cytology Cervicovaginal Ancillary Only  Result Value Ref Range   Yeast Wet Prep HPF POC NONE SEEN NONE SEEN  Trich, Wet Prep NONE SEEN NONE SEEN   Clue Cells Wet Prep HPF POC PRESENT (A) NONE SEEN   WBC, Wet Prep HPF POC >=10 (A) <10   Sperm NONE SEEN      --/--/O POS (12/12 1158)  IMAGING US OB LESS THAN 14 WEEKS WITH OB TRANSVAGINAL Result Date: 09/04/2023 CLINICAL DATA:  Abdominal pain, left back pain, seizure  EXAM: OBSTETRIC <14 WK [place] AND TRANSVAGINAL OB US TECHNIQUE: Both transabdominal and transvaginal ultrasound examinations were performed for complete evaluation of the gestation as well as the maternal uterus, adnexal regions, and pelvic cul-de-sac. Transvaginal technique was performed to assess early pregnancy. COMPARISON:  None Available. FINDINGS: Intrauterine gestational sac: Single Yolk sac:  Visualized. Embryo:  Not Visualized. Cardiac Activity: Not Visualized. MSD: 11.2 mm   5 w   6 d Subchorionic hemorrhage:  None visualized. Maternal uterus/adnexae: Right ovary measures 2.4 x 1.4 x 1.4 cm. The left ovary measures 2.9 x 2.1 x 2.6 cm, with corpus luteal cyst identified. No pelvic free fluid. IMPRESSION: 1. Probable early intrauterine gestational sac and yolk sac, but no fetal pole or cardiac activity yet visualized. Recommend follow-up quantitative B-HCG levels and follow-up US in 14 days to assess viability. This recommendation follows SRU consensus guidelines: Diagnostic Criteria for Nonviable Pregnancy Early in the First Trimester. Malva Limes Med 2013; 469:6295-28. Electronically Signed   By: Sharlet Salina M.D.   On: 09/04/2023 20:01    MAU Management/MDM: I have reviewed the triage vital signs and the nursing notes.   Pertinent labs & imaging results that were available during my care of the patient were reviewed by me and considered in my medical decision making (see chart for details).      I have reviewed her medical records including past results, notes and treatments. Medical, Surgical, and family history were reviewed.  Medications and recent lab tests were reviewed  Ordered usual first trimester r/o ectopic labs.   Pelvic cultures done Will check baseline Ultrasound to rule out ectopic.  Consult Dr Alvester Morin (provided Care) with presentation, exam findings, and results.   Treatments in MAU included Vagal Stimulator, Tylenol, [place].   This bleeding/pain can represent a normal pregnancy with  bleeding, spontaneous abortion or even an ectopic which can be life-threatening.  The process as listed above helps to determine which of these is present.  Reviewed results with patient who is now feeling better and back to her baseline Reviewed good rise in HCG and US showed Probable IUGS with Left Corpus Luteum cyst which is likely causing her pain DIscussed sciatica and exercises for relief Has Neuro appt this week and REI [place] next week. New OB already scheduled after that.   ASSESSMENT 1. LLQ pain   2. [redacted] weeks gestation of pregnancy   3. Nonintractable epilepsy without status epilepticus, unspecified epilepsy type (HCC)   4. Supervision of high risk pregnancy, antepartum   5. Sciatic nerve pain, unspecified laterality     PLAN Discharge home Plan to repeat US at [facility] as scheduled.   SAB precautions Will increase her Trimictal to q12 h from daily   Patient to discuss with Neurology this week   Follow-up Information     [facility] Follow up.   Specialty: Obstetrics and Gynecology Contact information: [address] [place] [address] [phone]               Pt stable at time of discharge. Encouraged to  return here if she develops worsening of symptoms, increase in pain, fever, or other concerning symptoms.    Wynelle Bourgeois CNM, MSN Certified Nurse-Midwife 09/04/2023  9:00 PM

## 2023-09-05 LAB — GC/CHLAMYDIA PROBE AMP (~~LOC~~) NOT AT ARMC
Chlamydia: NEGATIVE
Comment: NEGATIVE
Comment: NORMAL
Neisseria Gonorrhea: NEGATIVE

## 2023-09-11 ENCOUNTER — Other Ambulatory Visit

## 2023-09-14 ENCOUNTER — Other Ambulatory Visit: Payer: Self-pay

## 2023-09-14 ENCOUNTER — Inpatient Hospital Stay (HOSPITAL_COMMUNITY)

## 2023-09-14 ENCOUNTER — Inpatient Hospital Stay (HOSPITAL_COMMUNITY)
Admission: AD | Admit: 2023-09-14 | Discharge: 2023-09-14 | Disposition: A | Discharge status: Home / Self Care | Payer: Self-pay | Attending: Obstetrics & Gynecology | Admitting: Obstetrics & Gynecology

## 2023-09-14 ENCOUNTER — Encounter (HOSPITAL_COMMUNITY): Payer: Self-pay | Admitting: Obstetrics & Gynecology

## 2023-09-14 ENCOUNTER — Other Ambulatory Visit: Payer: Self-pay | Admitting: Certified Nurse Midwife

## 2023-09-14 DIAGNOSIS — O219 Vomiting of pregnancy, unspecified: Secondary | ICD-10-CM | POA: Diagnosis present

## 2023-09-14 DIAGNOSIS — O209 Hemorrhage in early pregnancy, unspecified: Secondary | ICD-10-CM | POA: Diagnosis present

## 2023-09-14 DIAGNOSIS — B9689 Other specified bacterial agents as the cause of diseases classified elsewhere: Secondary | ICD-10-CM

## 2023-09-14 DIAGNOSIS — G40909 Epilepsy, unspecified, not intractable, without status epilepticus: Secondary | ICD-10-CM | POA: Insufficient documentation

## 2023-09-14 DIAGNOSIS — Z113 Encounter for screening for infections with a predominantly sexual mode of transmission: Secondary | ICD-10-CM | POA: Diagnosis present

## 2023-09-14 DIAGNOSIS — O418X10 Other specified disorders of amniotic fluid and membranes, first trimester, not applicable or unspecified: Secondary | ICD-10-CM | POA: Diagnosis not present

## 2023-09-14 DIAGNOSIS — O99351 Diseases of the nervous system complicating pregnancy, first trimester: Secondary | ICD-10-CM | POA: Diagnosis not present

## 2023-09-14 DIAGNOSIS — Z3491 Encounter for supervision of normal pregnancy, unspecified, first trimester: Secondary | ICD-10-CM

## 2023-09-14 DIAGNOSIS — O468X1 Other antepartum hemorrhage, first trimester: Secondary | ICD-10-CM | POA: Diagnosis not present

## 2023-09-14 DIAGNOSIS — Z3A01 Less than 8 weeks gestation of pregnancy: Secondary | ICD-10-CM | POA: Insufficient documentation

## 2023-09-14 DIAGNOSIS — O0990 Supervision of high risk pregnancy, unspecified, unspecified trimester: Secondary | ICD-10-CM

## 2023-09-14 LAB — URINALYSIS, ROUTINE W REFLEX MICROSCOPIC
Bilirubin Urine: NEGATIVE
Glucose, UA: >=500 mg/dL — AB
Ketones, ur: 5 mg/dL — AB
Nitrite: NEGATIVE
Protein, ur: NEGATIVE mg/dL
Specific Gravity, Urine: 1.018 (ref 1.005–1.030)
pH: 7 (ref 5.0–8.0)

## 2023-09-14 LAB — WET PREP, GENITAL
Sperm: NONE SEEN
Trich, Wet Prep: NONE SEEN
WBC, Wet Prep HPF POC: >=10 — AB (ref <10)
Yeast Wet Prep HPF POC: NONE SEEN

## 2023-09-14 LAB — HCG, QUANTITATIVE, PREGNANCY: hCG, Beta Chain, Quant, S: 185525 m[IU]/mL — ABNORMAL HIGH (ref <5)

## 2023-09-14 MED ORDER — PROMETHAZINE HCL 25 MG RE SUPP: 25.0000 mg | Freq: Four times a day (QID) | RECTAL | 0 refills | Status: DC | PRN

## 2023-09-14 MED ORDER — OXCARBAZEPINE 150 MG PO TABS
600.0000 mg | ORAL_TABLET | Freq: Two times a day (BID) | ORAL | Status: AC
Start: 2023-09-14 — End: ?

## 2023-09-14 MED ORDER — METRONIDAZOLE 0.75 % VA GEL: 1.0000 | Freq: Every day | VAGINAL | 0 refills | Status: AC

## 2023-09-14 MED ORDER — FAMOTIDINE IN NACL 20-0.9 MG/50ML-% IV SOLN
20.0000 mg | Freq: Once | INTRAVENOUS | Status: AC
  Administered 2023-09-14: 20 mg via INTRAVENOUS
  Filled 2023-09-14: qty 50

## 2023-09-14 MED ORDER — DOXYLAMINE-PYRIDOXINE 10-10 MG PO TBEC: 1.0000 | DELAYED_RELEASE_TABLET | Freq: Every day | ORAL | 3 refills | Status: DC

## 2023-09-14 MED ORDER — SODIUM CHLORIDE 0.9 % IV SOLN
25.0000 mg | Freq: Once | INTRAVENOUS | Status: AC
  Administered 2023-09-14: 25 mg via INTRAVENOUS
  Filled 2023-09-14: qty 1

## 2023-09-14 NOTE — MAU Note (Signed)
 Pamela Booker is a 28 y.o. at [redacted]w[redacted]d here in MAU reporting: she hasn't been able to keep anything down since the beginning of the week.  Reports she wasn't abel to keep lunch or dinner down,  took Zofran this morning @ 0600.  Also reports since arrival into MAU began having VB, this RN viewed picture and noted dark brown spotting on toilet tissue.  Reports she also is having some abdominal cramping, reports feels like heaviness that's radiating into buttocks. States had [place] on Wednesday, cardiac activity noted.  LMP: 07/27/2023 Onset of complaint: Sunday or Monday Pain score: 4 Vitals:   09/14/23 1007  BP: 128/74  Pulse: 84  Resp: 18  Temp: 98.3 F (36.8 C)  SpO2: 97%     FHT: NA  Lab orders placed from triage: UA

## 2023-09-14 NOTE — MAU Provider Note (Signed)
 Chief Complaint:  Cramping, Spotting, Emesis, and Nausea  HPI  Pamela Booker is a 28 y.o. [phone] at [redacted]w[redacted]d who presents to maternity admissions reporting N/V x3 days, has not been able to keep food down. Last threw up at lunchtime yesterday, was able to keep down applesauce overnight. Took a Zofran today at 0600 but still feeling very nauseated. When she arrived to MAU, she began cramping and noted light brown discharge in the bathroom. Very upset upon seeing the brown discharge, has a history of seizures with an implanted device - did not have to use it, was able to calm. No other physical complaints.    Pregnancy Course: Receives care at [facility], [place] patient for hx of seizures, seized in MAU on last visit  Past Medical History:  Diagnosis Date   Anxiety    Bilateral nephrolithiasis    Depression    Endometriosis    Epilepsy (HCC)    GERD (gastroesophageal reflux disease)    History of kidney stones    HSV-2 infection    Hypertension    Inactive tuberculosis    Migraines    Nausea & vomiting 11/23/2022   Ovarian cyst    Palpitations    PVC (premature ventricular contraction)    Seizures (HCC)    last seizure was 2 weeks ago (app.12-18-22)   Status post placement of VNS (vagus nerve stimulation) device    Syncope and collapse 06/10/2019   Thyroid nodule    OB History  Gravida Para Term Preterm AB Living  5 1 1  0 3 1  SAB IAB Ectopic Multiple Live Births  3 0 0 0 1    # Outcome Date GA Lbr Len/2nd Weight Sex Type Anes PTL Lv  5 Current           4 SAB 05/2023          3 SAB 2021     Biochemical     2 SAB 2019          1 Term 10/17/13 [redacted]w[redacted]d  6 lb 2 oz (2.778 kg) F Vag-Spont   LIV     Complications: Preeclampsia   Past Surgical History:  Procedure Laterality Date   CYSTOSCOPY W/ URETERAL STENT REMOVAL Left 01/09/2023   Procedure: CYSTOSCOPY WITH STENT REMOVAL;  Surgeon: Geraline Knapp, MD;  Location: [facility];  Service: Urology;  Laterality: Left;    CYSTOSCOPY/URETEROSCOPY/HOLMIUM LASER/STENT PLACEMENT Left 01/02/2023   Procedure: CYSTOSCOPY/URETEROSCOPY/HOLMIUM LASER/STENT PLACEMENT;  Surgeon: Geraline Knapp, MD;  Location: [facility];  Service: Urology;  Laterality: Left;   CYSTOSCOPY/URETEROSCOPY/HOLMIUM LASER/STENT PLACEMENT Right 01/09/2023   Procedure: CYSTOSCOPY/URETEROSCOPY/HOLMIUM LASER/STENT PLACEMENT;  Surgeon: Geraline Knapp, MD;  Location: [facility];  Service: Urology;  Laterality: Right;   DILATION AND EVACUATION N/A 05/17/2023   Procedure: DILATATION AND EVACUATION;  Surgeon: Raynell Caller, MD;  Location: [facility];  Service: Gynecology;  Laterality: N/A;   ENDOMETRIAL ABLATION  06/2022   [facility]   LAPAROSCOPY     x5 for endometriosis   LOOP RECORDER INSERTION  2020   LOOP RECORDER REMOVAL  2021   TONSILLECTOMY     URETEROSCOPY Left 01/09/2023   Procedure: URETEROSCOPY;  Surgeon: Geraline Knapp, MD;  Location: [facility];  Service: Urology;  Laterality: Left;   VAGUS NERVE STIMULATOR INSERTION  2022   Family History  Adopted: Yes  Problem Relation Age of Onset   Healthy Daughter    Social History   Tobacco Use   Smoking status: Never  Smokeless tobacco: Never  Vaping Use   Vaping status: Former   Substances: Nicotine  Substance Use Topics   Alcohol use: Not Currently    Comment: socially   Drug use: Not Currently    Comment: THC   Allergies  Allergen Reactions   Nsaids Other (See Comments)    History of bleeding ulcers   Ativan [Lorazepam] Itching   Compazine [Prochlorperazine]    Diphenhydramine Other (See Comments)    "Crawling feeling under skin"   Ibuprofen     Other reaction(s): GI Upset (intolerance)   Keppra [Levetiracetam] Other (See Comments)    "Makes her feel hungover"    Zolpidem Other (See Comments)    seizure   Emgality [Galcanezumab-Gnlm] Rash   Medications Prior to Admission  Medication Sig Dispense Refill Last Dose/Taking   diazepam (VALIUM) 10 MG tablet Take 1 tablet (10 mg total)  by mouth every 6 (six) hours as needed (Seizure/aura). 30 tablet 0 09/13/2023 at  7:30 PM   escitalopram (LEXAPRO) 20 MG tablet Take 20 mg by mouth daily.   09/13/2023 at  7:30 PM   folic acid (FOLVITE) 1 MG tablet Take 4 mg by mouth daily.   09/13/2023 at  7:30 PM   OXcarbazepine (TRILEPTAL) 150 MG tablet Take 1 tablet (150 mg total) by mouth daily. (Patient taking differently: Take 600 mg by mouth 2 (two) times daily.) 90 tablet 5 09/14/2023 at  8:00 AM   Prenatal Vit-Fe Fumarate-FA (MULTIVITAMIN-PRENATAL) 27-0.8 MG TABS tablet Take 2 tablets by mouth daily at 12 noon.   09/13/2023 at  7:30 PM   acetaminophen (TYLENOL) 325 MG tablet Take 650 mg by mouth every 6 (six) hours as needed.      cyclobenzaprine (FLEXERIL) 10 MG tablet Take 1 tablet (10 mg total) by mouth 3 (three) times daily as needed. 30 tablet 0    diazepam (DIASTAT ACUDIAL) 10 MG GEL Place 10 mg rectally once for 1 dose. (Patient not taking: Reported on 05/15/2023) 1 each 2    famotidine (PEPCID) 20 MG tablet Take 1 tablet (20 mg total) by mouth 2 (two) times daily. (Patient not taking: Reported on 05/15/2023) 60 tablet 3    Heating Pads PADS 1 Pad by Does not apply route as needed (cramping).      valACYclovir (VALTREX) 500 MG tablet Take 500 mg by mouth daily as needed (Cold sores).   More than a month   I have reviewed patient's Past Medical Hx, Surgical Hx, Family Hx, Social Hx, medications and allergies.   ROS  Pertinent items noted in HPI and remainder of comprehensive ROS otherwise negative.   PHYSICAL EXAM  Patient Vitals for the past 24 hrs:  BP Temp Temp src Pulse Resp SpO2 Height Weight  09/14/23 1007 128/74 98.3 F (36.8 C) Oral 84 18 97 % -- --  09/14/23 0957 -- -- -- -- -- -- 5\' 2"  (1.575 m) 157 lb 12.8 oz (71.6 kg)   Constitutional: Well-developed, well-nourished female in no acute distress.  Cardiovascular: normal rate & rhythm, warm and well-perfused Respiratory: normal effort, no problems with respiration  noted GI: Abd soft, non-tender, non-distended MS: Extremities nontender, no edema, normal ROM Neurologic: Alert and oriented x 4.  GU: no CVA tenderness Pelvic: sent to ultrasound   Labs: Results for orders placed or performed during the hospital encounter of 09/14/23 (from the past 24 hours)  Urinalysis, Routine w reflex microscopic -Urine, Clean Catch     Status: Abnormal   Collection Time: 09/14/23  9:50 AM  Result Value Ref Range   Color, Urine YELLOW YELLOW   APPearance HAZY (A) CLEAR   Specific Gravity, Urine 1.018 1.005 - 1.030   pH 7.0 5.0 - 8.0   Glucose, UA >=500 (A) NEGATIVE mg/dL   Hgb urine dipstick MODERATE (A) NEGATIVE   Bilirubin Urine NEGATIVE NEGATIVE   Ketones, ur 5 (A) NEGATIVE mg/dL   Protein, ur NEGATIVE NEGATIVE mg/dL   Nitrite NEGATIVE NEGATIVE   Leukocytes,Ua TRACE (A) NEGATIVE   RBC / HPF 0-5 0 - 5 RBC/hpf   WBC, UA 0-5 0 - 5 WBC/hpf   Bacteria, UA RARE (A) NONE SEEN   Squamous Epithelial / HPF 0-5 0 - 5 /HPF   Mucus PRESENT    Microscopic wet-mount exam shows clue cells.  Imaging:  Narrative & Impression  CLINICAL DATA:  Vaginal spotting.   EXAM: TRANSVAGINAL OB ULTRASOUND   TECHNIQUE: Transvaginal ultrasound was performed for complete evaluation of the gestation as well as the maternal uterus, adnexal regions, and pelvic cul-de-sac.   COMPARISON:  Ultrasound 09/04/2023.   FINDINGS: Intrauterine gestational sac: Yes   Yolk sac:  Yes   Embryo:  Yes   Cardiac Activity: Yes   Heart Rate: 124 bpm   CRL:   5.1 mm   6 w 1 d                  US  EDC: 05/08/2024   Subchorionic hemorrhage:  Small   Maternal uterus/adnexae: Left ovary measures 2.7 x 1.8 x 2.4 cm and has a small complex cystic area measuring 17 mm. Right ovary has small follicles and measures 2.5 x 1.0 x 1.8 cm. Small amount of free fluid in the pelvis.   IMPRESSION: Single live intra pregnancy of 6 weeks and 1 day by today's ultrasound with positive fetal heart  motion. Small subchronic hemorrhage. Small amount of free fluid the pelvis.     Electronically Signed   By: Adrianna Horde M.D.   On: 09/14/2023 13:47      MDM & MAU COURSE  MDM: Moderate  MAU Course: Orders Placed This Encounter  Procedures   Wet prep, genital   US  OB Transvaginal   Urinalysis, Routine w reflex microscopic -Urine, Clean Catch   CBC   hCG, quantitative, pregnancy   ABO/Rh   Meds ordered this encounter  Medications   promethazine (PHENERGAN) 25 mg in sodium chloride 0.9 % 1,000 mL infusion    Pt stated she has taken phenergan before without issue   famotidine (PEPCID) IVPB 20 mg premix   Pt upset in waiting room/triage but much calmer during CNM visit in the room.  Swabs collected and sent pt to ultrasound. IV fluids and meds ordered.  Nausea eased with meds. Reviewed results of swab/ultrasound and advised pelvic rest for two weeks with [facility] [place].  Am sending in metrogel given pt's history of N/V, she is unlikely to tolerate oral flagyl. Stable for discharge home with [facility] bleeding precautions.  ASSESSMENT  [id number] at [redacted]w[redacted]d Subchorionic hematoma  Nausea and vomiting in pregnancy Fetal heart rate present  PLAN  Discharge home in stable condition with return precautions.     Allergies as of 09/14/2023       Reactions   Nsaids Other (See Comments)   History of bleeding ulcers   Ativan [lorazepam] Itching   Compazine [prochlorperazine]    Diphenhydramine Other (See Comments)   "Crawling feeling under skin"   Ibuprofen    Other reaction(s): GI Upset (intolerance)  Keppra [levetiracetam] Other (See Comments)   "Makes her feel hungover"    Zolpidem Other (See Comments)   seizure   Emgality [galcanezumab-gnlm] Rash        Medication List     STOP taking these medications    acetaminophen 325 MG tablet Commonly known as: TYLENOL   cyclobenzaprine 10 MG tablet Commonly known as: FLEXERIL   diazepam 10 MG Gel Commonly known as: DIASTAT ACUDIAL    famotidine 20 MG tablet Commonly known as: PEPCID       TAKE these medications    diazepam 10 MG tablet Commonly known as: Valium Take 1 tablet (10 mg total) by mouth every 6 (six) hours as needed (Seizure/aura).   Doxylamine-Pyridoxine 10-10 MG Tbec Commonly known as: Diclegis Take 1-2 tablets by mouth at bedtime.   escitalopram 20 MG tablet Commonly known as: LEXAPRO Take 20 mg by mouth daily.   folic acid 1 MG tablet Commonly known as: FOLVITE Take 4 mg by mouth daily.   Heating Pads Pads 1 Pad by Does not apply route as needed (cramping).   multivitamin-prenatal 27-0.8 MG Tabs tablet Take 2 tablets by mouth daily at 12 noon.   OXcarbazepine 150 MG tablet Commonly known as: Trileptal Take 4 tablets (600 mg total) by mouth 2 (two) times daily.   promethazine 25 MG suppository Commonly known as: PHENERGAN Place 1 suppository (25 mg total) rectally every 6 (six) hours as needed for nausea or vomiting.   valACYclovir 500 MG tablet Commonly known as: VALTREX Take 500 mg by mouth daily as needed (Cold sores).        Future Appointments  Date Time Provider Department Center  10/01/2023  2:55 PM Cresenzo, Mardee Shackle, MD [facility]   Lemuel Quaker, CNM, MSN, [facility] Certified Nurse Midwife, [facility]

## 2023-09-16 LAB — GC/CHLAMYDIA PROBE AMP (~~LOC~~) NOT AT ARMC
Chlamydia: NEGATIVE
Comment: NEGATIVE
Comment: NORMAL
Neisseria Gonorrhea: NEGATIVE

## 2023-09-17 ENCOUNTER — Telehealth: Payer: Self-pay | Admitting: *Deleted

## 2023-09-17 ENCOUNTER — Encounter: Payer: Self-pay | Admitting: *Deleted

## 2023-09-17 DIAGNOSIS — O3680X0 Pregnancy with inconclusive fetal viability, not applicable or unspecified: Secondary | ICD-10-CM

## 2023-09-17 NOTE — Telephone Encounter (Signed)
 Pamela Booker called this am and left a voice message that she just got a message her US  appointment was cancelled. She reports she did go to hospital Friday and have US  but she woke up this morning after having extreme nausea for the past week to having no nausea or symptoms of pregnancy today. States she would appreciate being put back on schedule. States she took the time off from work and telling herself not to freak out because she has appointment to check on her baby tomorrow. States there is no reason to take her off and we don't have right to take her off because for her piece of mind needs US . States she lost a baby at [redacted]w[redacted]d and this baby measuered [redacted]w[redacted]d.  I discussed with Dr. Aquilla Knapp and he advises she may have US  appointment tomorrow. I rescheduled patient for US  tomorrow at original time. I called patient and informed her I talked with a provider and rescheduled her for tomorrow. She apologized for her " ugly " phone call and states her Mother-in -Law actually went to office and spoke with front office and they spoke with provider and she may be ok to not have the US . She agreed she will message me with her decision. [name]

## 2023-09-18 ENCOUNTER — Ambulatory Visit

## 2023-09-18 ENCOUNTER — Other Ambulatory Visit

## 2023-09-18 DIAGNOSIS — Z3481 Encounter for supervision of other normal pregnancy, first trimester: Secondary | ICD-10-CM | POA: Diagnosis not present

## 2023-09-18 DIAGNOSIS — Z3A01 Less than 8 weeks gestation of pregnancy: Secondary | ICD-10-CM

## 2023-09-18 DIAGNOSIS — O3680X0 Pregnancy with inconclusive fetal viability, not applicable or unspecified: Secondary | ICD-10-CM

## 2023-09-21 ENCOUNTER — Encounter: Payer: Self-pay | Admitting: Family Medicine

## 2023-09-21 ENCOUNTER — Encounter (HOSPITAL_COMMUNITY): Payer: Self-pay | Admitting: Obstetrics and Gynecology

## 2023-09-21 ENCOUNTER — Ambulatory Visit
Admission: EM | Admit: 2023-09-21 | Discharge: 2023-09-21 | Disposition: A | Discharge status: Home / Self Care | Attending: Family Medicine | Admitting: Family Medicine

## 2023-09-21 ENCOUNTER — Inpatient Hospital Stay (HOSPITAL_COMMUNITY)

## 2023-09-21 ENCOUNTER — Inpatient Hospital Stay (HOSPITAL_COMMUNITY)
Admission: AD | Admit: 2023-09-21 | Discharge: 2023-09-21 | Disposition: A | Discharge status: Home / Self Care | Payer: Self-pay | Attending: Obstetrics and Gynecology | Admitting: Obstetrics and Gynecology

## 2023-09-21 DIAGNOSIS — Z6790 Unspecified blood type, Rh positive: Secondary | ICD-10-CM

## 2023-09-21 DIAGNOSIS — R350 Frequency of micturition: Secondary | ICD-10-CM | POA: Insufficient documentation

## 2023-09-21 DIAGNOSIS — Z8759 Personal history of other complications of pregnancy, childbirth and the puerperium: Secondary | ICD-10-CM | POA: Diagnosis present

## 2023-09-21 DIAGNOSIS — O0991 Supervision of high risk pregnancy, unspecified, first trimester: Secondary | ICD-10-CM | POA: Insufficient documentation

## 2023-09-21 DIAGNOSIS — Z3491 Encounter for supervision of normal pregnancy, unspecified, first trimester: Secondary | ICD-10-CM

## 2023-09-21 DIAGNOSIS — O468X1 Other antepartum hemorrhage, first trimester: Secondary | ICD-10-CM | POA: Insufficient documentation

## 2023-09-21 DIAGNOSIS — O209 Hemorrhage in early pregnancy, unspecified: Secondary | ICD-10-CM | POA: Diagnosis not present

## 2023-09-21 DIAGNOSIS — Z3A08 8 weeks gestation of pregnancy: Secondary | ICD-10-CM

## 2023-09-21 DIAGNOSIS — G40909 Epilepsy, unspecified, not intractable, without status epilepticus: Secondary | ICD-10-CM | POA: Diagnosis not present

## 2023-09-21 DIAGNOSIS — O99351 Diseases of the nervous system complicating pregnancy, first trimester: Secondary | ICD-10-CM | POA: Insufficient documentation

## 2023-09-21 DIAGNOSIS — N898 Other specified noninflammatory disorders of vagina: Secondary | ICD-10-CM | POA: Insufficient documentation

## 2023-09-21 LAB — URINALYSIS, W/ REFLEX TO CULTURE (INFECTION SUSPECTED)
Bilirubin Urine: NEGATIVE
Glucose, UA: >=500 mg/dL — AB
Hgb urine dipstick: NEGATIVE
Leukocytes,Ua: NEGATIVE
Nitrite: NEGATIVE
Protein, ur: NEGATIVE mg/dL
Specific Gravity, Urine: 1.02 (ref 1.005–1.030)
pH: 6 (ref 5.0–8.0)

## 2023-09-21 NOTE — MAU Note (Signed)
..  Pamela Booker is a 28 y.o. at [redacted]w[redacted]d here in MAU reporting: was sent from urgent care for discharge. Patient has photo with her. Has not had more bleeding or tissue.  Is having some lower abdominal cramping.   Pain score: 1/10 Vitals:   09/21/23 1931  BP: 127/68  Pulse: 62  Resp: 17  Temp: 98.7 F (37.1 C)  SpO2: 100%     FHT:n/a Lab orders placed from triage:  none

## 2023-09-21 NOTE — ED Provider Notes (Signed)
 [facility]    CSN: [id number] Arrival date & time: 09/21/23  1733      History   Chief Complaint Chief Complaint  Patient presents with   Vaginal Discharge     HPI HPI Pamela Booker is a 28 y.o. female.    Pamela Booker presents for thick white-brown vaginal discharge that she noticed yesterday.  Tried nothing for her symptoms prior to arrival.  Has had any antibiotics in last 30 days for BV.   Denies known STI exposure. She is about [redacted] weeks pregnant.  Has some urinary frequency and been having dark urine. Denies dysuria.     Past Medical History:  Diagnosis Date   Anxiety    Bilateral nephrolithiasis    Depression    Endometriosis    Epilepsy (HCC)    GERD (gastroesophageal reflux disease)    History of kidney stones    HSV-2 infection    Hypertension    Inactive tuberculosis    Migraines    Nausea & vomiting 11/23/2022   Ovarian cyst    Palpitations    PVC (premature ventricular contraction)    Seizures (HCC)    last seizure was 2 weeks ago (app.12-18-22)   Status post placement of VNS (vagus nerve stimulation) device    Syncope and collapse 06/10/2019   Thyroid  nodule     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   Major depressive disorder 07/05/2021   Breakthrough seizure (HCC) 07/05/2021   Epilepsy (HCC) 07/05/2021   Migraine headache 07/05/2021   Essential hypertension 07/05/2021   Seizure (HCC) 07/05/2021   Irregular intermenstrual bleeding 06/13/2018   Vaginal  dryness 06/06/2018   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   PVC (premature ventricular contraction) 03/11/2018   Endometriosis 03/14/2017   Atypical chest pain 04/25/2016   Palpitations 04/25/2016   Shortness of breath 04/25/2016   Chronic migraine without aura without status migrainosus, not intractable 12/11/2014    Past Surgical History:  Procedure Laterality Date   CYSTOSCOPY W/ URETERAL STENT REMOVAL Left 01/09/2023   Procedure: CYSTOSCOPY WITH STENT REMOVAL;  Surgeon: Geraline Knapp, MD;  Location: [facility];  Service: Urology;  Laterality: Left;   CYSTOSCOPY/URETEROSCOPY/HOLMIUM LASER/STENT PLACEMENT Left 01/02/2023   Procedure: CYSTOSCOPY/URETEROSCOPY/HOLMIUM LASER/STENT PLACEMENT;  Surgeon: Geraline Knapp, MD;  Location: [facility];  Service: Urology;  Laterality: Left;   CYSTOSCOPY/URETEROSCOPY/HOLMIUM LASER/STENT PLACEMENT Right 01/09/2023   Procedure: CYSTOSCOPY/URETEROSCOPY/HOLMIUM LASER/STENT PLACEMENT;  Surgeon: Geraline Knapp, MD;  Location: [facility];  Service: Urology;  Laterality: Right;   DILATION AND EVACUATION N/A 05/17/2023   Procedure: DILATATION AND EVACUATION;  Surgeon: Raynell Caller, MD;  Location: [facility];  Service: Gynecology;  Laterality: N/A;   ENDOMETRIAL ABLATION  06/2022   [facility]   LAPAROSCOPY     x5 for endometriosis   LOOP RECORDER INSERTION  2020   LOOP RECORDER REMOVAL  2021   TONSILLECTOMY     URETEROSCOPY Left 01/09/2023   Procedure: URETEROSCOPY;  Surgeon: Geraline Knapp, MD;  Location: [facility];  Service: Urology;  Laterality: Left;   VAGUS NERVE STIMULATOR INSERTION  2022    OB History     Gravida  5   Para  1   Term  1   Preterm  0   AB  3   Living  1      SAB  3   IAB  0   Ectopic  0   Multiple  0   Live Births  1            Home Medications    Prior to Admission medications   Medication Sig Start Date End Date Taking? Authorizing Provider  metroNIDAZOLE  (METROGEL ) 0.75 % vaginal gel Place 1 Applicatorful vaginally  at bedtime for 7 days. 09/14/23 09/21/23  Derick Fleeting, CNM  diazepam  (VALIUM ) 10 MG tablet Take 1 tablet (10 mg total) by mouth every 6 (six) hours as needed (Seizure/aura). 05/09/23   Abner Ables, MD  Doxylamine -Pyridoxine  (DICLEGIS ) 10-10 MG TBEC Take 1-2 tablets by mouth at bedtime. 09/14/23   Derick Fleeting, CNM  escitalopram  (LEXAPRO ) 20 MG tablet Take 20 mg by mouth daily.    [provider]  folic acid (FOLVITE) 1 MG tablet Take 4 mg by mouth daily.    [provider]  Heating Pads PADS 1 Pad by Does not apply route as needed (cramping). 05/17/23   Raynell Caller, MD  OXcarbazepine  (TRILEPTAL ) 150 MG tablet Take 4 tablets (600 mg total) by mouth 2 (two) times daily. 09/14/23   Derick Fleeting, CNM  Prenatal Vit-Fe Fumarate-FA (MULTIVITAMIN-PRENATAL) 27-0.8 MG TABS tablet Take 2 tablets by mouth daily at 12 noon.    [provider]  promethazine  (PHENERGAN ) 25 MG suppository Place 1 suppository (25 mg total) rectally every 6 (six) hours as needed for nausea or vomiting. 09/14/23   Lemuel Quaker R, CNM  valACYclovir  (VALTREX ) 500 MG tablet Take 500 mg by mouth daily as needed (Cold sores).    [provider]    Family History Family History  Adopted: Yes  Problem Relation Age of Onset   Healthy Daughter     Social History Social History   Tobacco Use   Smoking status: Never   Smokeless tobacco: Never  Vaping Use   Vaping status: Former   Substances: Nicotine  Substance Use Topics   Alcohol use: Not Currently    Comment: socially   Drug use: Not Currently    Comment: THC     Allergies   Nsaids, Ativan  [lorazepam ], Compazine  [prochlorperazine ], Diphenhydramine , Ibuprofen, Keppra  [levetiracetam ], Zolpidem, and Emgality [galcanezumab-gnlm]   Review of Systems Review of Systems: :negative unless otherwise stated in HPI.      Physical Exam Triage Vital Signs ED Triage Vitals  Encounter Vitals Group     BP       Systolic BP Percentile      Diastolic BP Percentile      Pulse      Resp      Temp      Temp src      SpO2      Weight      Height      Head Circumference      Peak Flow      Pain Score      Pain Loc      Pain Education      Exclude from Growth Chart    No data found.  Updated Vital Signs BP 120/76 (BP Location: Right Arm)   Pulse 69   Temp 98.6 F (37 C) (Oral)   Resp 14  Ht 5\' 2"  (1.575 m)   Wt 69.4 kg   LMP 07/27/2023   SpO2 96%   BMI 27.98 kg/m   Visual Acuity Right Eye Distance:   Left Eye Distance:   Bilateral Distance:    Right Eye Near:   Left Eye Near:    Bilateral Near:     Physical Exam GEN: well appearing female in no acute distress  CVS: well perfused  RESP: speaking in full sentences without pause  GU: deferred, patient performed self swab    UC Treatments / Results  Labs (all labs ordered are listed, but only abnormal results are displayed) Labs Reviewed  URINALYSIS, W/ REFLEX TO CULTURE (INFECTION SUSPECTED) - Abnormal; Notable for the following components:      Result Value   Glucose, UA >=500 (*)    Ketones, ur TRACE (*)    Bacteria, UA FEW (*)    All other components within normal limits  CERVICOVAGINAL ANCILLARY ONLY    EKG   Radiology No results found.  Procedures Procedures (including critical care time)  Medications Ordered in UC Medications - No data to display  Initial Impression / Assessment and Plan / UC Course  I have reviewed the triage vital signs and the nursing notes.  Pertinent labs & imaging results that were available during my care of the patient were reviewed by me and considered in my medical decision making (see chart for details).      Patient is a 28 y.o.Pamela Booker female who is [redacted] weeks pregnant who presents for vaginal discharge.  She has been passing white and brown clumps since yesterday.  Of note, she had a miscarriage in December.  Follows with [facility] for high risk pregnancy.  Overall patient is  well-appearing and afebrile.  Vital signs stable.    Spoke with OB fellow Dr. Authur Leghorn regarding patient's symptoms and my concern for possible decidual cast and concern for  possible miscarriage.  Dr Nolon Baxter recommended getting a repeat ultrasound to see if she continues to have a IUP.  Patient does not want to go to the MAU for an ultrasound.  She has an ultrasound scheduled for Monday with [facility].  She states she will wait until then.  Strict precautions given and she voiced understanding.  Urinalysis consistent with without acute cystitis.  No hematuria supported on microscopy.  She does have some glucosuria with trace ketones.  No yeast seen therefore would defer treatment for now. Vaginal swab for yeast vaginitis and bacterial vaginitis obtained.     Discussed MDM, treatment plan and plan for follow-up with patient who agrees with plan.      Final Clinical Impressions(s) / UC Diagnoses   Final diagnoses:  Vaginal discharge  Urinary frequency  High-risk pregnancy in first trimester  History of miscarriage     Discharge Instructions      Follow up with [facility] as scheduled.  If bleeding or abdominal pain occur, go to the MAU for further evaluation.    Your vaginal swab results will be available in the next 72 hours. If positive, someone will contact you.  You should see your results in your MyChart account.       ED Prescriptions   None    PDMP not reviewed this encounter.   Ransom Nickson, DO 09/21/23 1830

## 2023-09-21 NOTE — MAU Provider Note (Cosign Needed)
 History     CSN: [id number]  Arrival date and time: 09/21/23 1857   Event Date/Time   First Provider Initiated Contact with Patient 09/21/23 1955      Chief Complaint  Patient presents with   Vaginal Discharge   HPI [name] is a 28 y.o. year old [id number] female at [redacted]w[redacted]d weeks gestation who was sent to MAU from Urgent Care for further evaluation. She passed an unusual piece of tissue like discharge while using the bathroom at Urgent Care. The Urgent Care doctor sent her to MAU for further evaluation of this unusual vaginal discharge. She denies any VB or tissue like discharge since. She reports pain at 1/10. She was recently dx'd with BV and just completed her treatment phase of BV. She was dx'd with a [facility] on 09/18/2023. Her pregnancy is high risk for her history of epilepsy; which is poorly controlled (has vagal stimulator). She receives [facility] with [facility]; next appt is 10/08/2023. Her spouse is present and contributing to the history taking. Review of photo taken by doctor at Urgent Care:  OB History     Gravida  5   Para  1   Term  1   Preterm  0   AB  3   Living  1      SAB  3   IAB  0   Ectopic  0   Multiple  0   Live Births  1           Past Medical History:  Diagnosis Date   Anxiety    Bilateral nephrolithiasis    Depression    Endometriosis    Epilepsy (HCC)    GERD (gastroesophageal reflux disease)    History of kidney stones    HSV-2 infection    Hypertension    Inactive tuberculosis    Migraines    Nausea & vomiting 11/23/2022   Ovarian cyst    Palpitations    PVC (premature ventricular contraction)    Seizures (HCC)    last seizure was 2 weeks ago (app.12-18-22)   Status post placement of VNS (vagus nerve stimulation) device    Syncope and collapse 06/10/2019   Thyroid  nodule     Past Surgical History:  Procedure Laterality Date   CYSTOSCOPY W/ URETERAL STENT REMOVAL Left 01/09/2023   Procedure: CYSTOSCOPY WITH STENT  REMOVAL;  Surgeon: Pamela Knapp, MD;  Location: [facility] ORS;  Service: Urology;  Laterality: Left;   CYSTOSCOPY/URETEROSCOPY/HOLMIUM LASER/STENT PLACEMENT Left 01/02/2023   Procedure: CYSTOSCOPY/URETEROSCOPY/HOLMIUM LASER/STENT PLACEMENT;  Surgeon: Pamela Knapp, MD;  Location: [facility] ORS;  Service: Urology;  Laterality: Left;   CYSTOSCOPY/URETEROSCOPY/HOLMIUM LASER/STENT PLACEMENT Right 01/09/2023   Procedure: CYSTOSCOPY/URETEROSCOPY/HOLMIUM LASER/STENT PLACEMENT;  Surgeon: Pamela Knapp, MD;  Location: [facility] ORS;  Service: Urology;  Laterality: Right;   DILATION AND EVACUATION N/A 05/17/2023   Procedure: DILATATION AND EVACUATION;  Surgeon: Pamela Caller, MD;  [facility];  Service: Gynecology;  Laterality: N/A;   ENDOMETRIAL ABLATION  06/2022   [facility]   LAPAROSCOPY     x5 for endometriosis   LOOP RECORDER INSERTION  2020   LOOP RECORDER REMOVAL  2021   TONSILLECTOMY     URETEROSCOPY Left 01/09/2023   Procedure: URETEROSCOPY;  Surgeon: Pamela Knapp, MD;  Location: [facility] ORS;  Service: Urology;  Laterality: Left;   VAGUS NERVE STIMULATOR INSERTION  2022    Family History  Adopted: Yes  Problem Relation Age of Onset   Healthy Daughter  Social History   Tobacco Use   Smoking status: Never   Smokeless tobacco: Never  Vaping Use   Vaping status: Former   Substances: Nicotine  Substance Use Topics   Alcohol use: Not Currently    Comment: socially   Drug use: Not Currently    Comment: THC    Allergies:  Allergies  Allergen Reactions   Nsaids Other (See Comments)    History of bleeding ulcers   Ativan  [Lorazepam ] Itching   Compazine  [Prochlorperazine ]    Diphenhydramine  Other (See Comments)    "Crawling feeling under skin"   Ibuprofen     Other reaction(s): GI Upset (intolerance)   Keppra  [Levetiracetam ] Other (See Comments)    "Makes her feel hungover"    Zolpidem Other (See Comments)    seizure   Emgality [Galcanezumab-Gnlm] Rash    No medications  prior to admission.    Review of Systems  Constitutional: Negative.   HENT: Negative.    Eyes: Negative.   Respiratory: Negative.    Cardiovascular: Negative.   Gastrointestinal: Negative.   Endocrine: Negative.   Genitourinary:  Positive for vaginal discharge.  Musculoskeletal: Negative.   Skin: Negative.   Allergic/Immunologic: Negative.   Neurological: Negative.   Hematological: Negative.   Psychiatric/Behavioral: Negative.     Physical Exam   Blood pressure 121/80, pulse 65, temperature 98.7 F (37.1 C), temperature source Oral, resp. rate 17, height 5\' 2"  (1.575 m), weight 71.4 kg, last menstrual period 07/27/2023, SpO2 100%.  Physical Exam Vitals and nursing note reviewed.  Constitutional:      Appearance: Normal appearance. She is normal weight.  Cardiovascular:     Rate and Rhythm: Normal rate.  Pulmonary:     Effort: Pulmonary effort is normal.  Abdominal:     General: Abdomen is flat.  Genitourinary:    Comments: deferred Musculoskeletal:        General: Normal range of motion.  Skin:    General: Skin is warm and dry.  Neurological:     Mental Status: She is alert and oriented to person, place, and time.  Psychiatric:        Mood and Affect: Mood normal.        Behavior: Behavior normal.        Thought Content: Thought content normal.        Judgment: Judgment normal.    MAU Course  Procedures  MDM OB <14 wks TVUS  Report given to and care assumed by Pamela Booker, CNM  Almond Army, CNM 09/21/2023, 7:55 PM    US  OB Comp Less 14 Wks Result Date: 09/21/2023 CLINICAL DATA:  Vaginal discharge EXAM: OBSTETRIC <14 WK ULTRASOUND TECHNIQUE: Transabdominal ultrasound was performed for evaluation of the gestation as well as the maternal uterus and adnexal regions. COMPARISON:  09/18/2023, 09/14/2023 FINDINGS: Intrauterine gestational sac: Single Yolk sac:  Visualized Embryo:  Visualized Cardiac Activity: Visualized Heart Rate: 154 bpm CRL:   14.5  mm   7 w 5 d                  US  EDC: 05/04/2024 Subchorionic hemorrhage:  Small subchorionic hemorrhage Maternal uterus/adnexae: Ovaries are within normal limits. Right ovary measures 2.2 x 2.2 by 1.9 cm. Left ovary measures 2.8 x 2.5 by 2.6 cm. No significant free fluid. IMPRESSION: 1. Single viable intrauterine pregnancy as above. 2. Small subchorionic hemorrhage without significant change Electronically Signed   By: Esmeralda Hedge M.D.   On: 09/21/2023 22:31    MDM: Viable IUP  noted on US , with SCH, unchanged from previous exam.  Pt with red bleeding several days ago, likely r/t [facility], and today the brown discharge is likely old blood from previous bleeding episode.  Reassurance provided to pt. Return precautions reviewed.  Keep scheduled appts in the office.   Assessment and Plan   1. Subchorionic hematoma in first trimester, single or unspecified fetus   2. Vaginal bleeding in pregnancy, first trimester   3. Normal IUP (intrauterine pregnancy) on prenatal ultrasound, first trimester   4. [redacted] weeks gestation of pregnancy   5. Blood type, Rh positive      Pamela Booker, CNM 1:53 AM

## 2023-09-21 NOTE — MAU Note (Signed)
 RN called Radiology reading room to inquire about pt US  not being read yet. They stated they see the US  and will have it expedited.

## 2023-09-21 NOTE — Discharge Instructions (Signed)
 Follow up with [facility] as scheduled.  If bleeding or abdominal pain occur, go to the MAU for further evaluation.    Your vaginal swab results will be available in the next 72 hours. If positive, someone will contact you.  You should see your results in your MyChart account.

## 2023-09-21 NOTE — ED Triage Notes (Signed)
 Patient states that she was treated at her OB/GYN last Friday for BV.  Patient states that today she had a dark colored discharge.  Patient denies vaginal itching.

## 2023-09-24 LAB — CERVICOVAGINAL ANCILLARY ONLY
Bacterial Vaginitis (gardnerella): NEGATIVE
Candida Glabrata: NEGATIVE
Candida Vaginitis: NEGATIVE
Comment: NEGATIVE
Comment: NEGATIVE
Comment: NEGATIVE

## 2023-09-24 NOTE — Telephone Encounter (Signed)
 Patient seen in MAU 09/21/23

## 2023-10-01 ENCOUNTER — Encounter: Admitting: Family Medicine

## 2023-10-08 ENCOUNTER — Other Ambulatory Visit (HOSPITAL_COMMUNITY)
Admission: RE | Admit: 2023-10-08 | Discharge: 2023-10-08 | Disposition: A | Discharge status: Home / Self Care | Source: Ambulatory Visit | Attending: Obstetrics & Gynecology | Admitting: Obstetrics & Gynecology

## 2023-10-08 ENCOUNTER — Other Ambulatory Visit (HOSPITAL_COMMUNITY): Admit: 2023-10-08

## 2023-10-08 ENCOUNTER — Other Ambulatory Visit: Payer: Self-pay

## 2023-10-08 ENCOUNTER — Ambulatory Visit (INDEPENDENT_AMBULATORY_CARE_PROVIDER_SITE_OTHER): Admitting: Obstetrics & Gynecology

## 2023-10-08 ENCOUNTER — Encounter: Payer: Self-pay | Admitting: Obstetrics & Gynecology

## 2023-10-08 VITALS — BP 122/83 | HR 76 | Wt 154.2 lb

## 2023-10-08 DIAGNOSIS — O26891 Other specified pregnancy related conditions, first trimester: Secondary | ICD-10-CM | POA: Insufficient documentation

## 2023-10-08 DIAGNOSIS — Z79899 Other long term (current) drug therapy: Secondary | ICD-10-CM | POA: Insufficient documentation

## 2023-10-08 DIAGNOSIS — N898 Other specified noninflammatory disorders of vagina: Secondary | ICD-10-CM | POA: Insufficient documentation

## 2023-10-08 DIAGNOSIS — Z3A10 10 weeks gestation of pregnancy: Secondary | ICD-10-CM

## 2023-10-08 DIAGNOSIS — Z9689 Presence of other specified functional implants: Secondary | ICD-10-CM | POA: Diagnosis not present

## 2023-10-08 DIAGNOSIS — O210 Mild hyperemesis gravidarum: Secondary | ICD-10-CM

## 2023-10-08 DIAGNOSIS — Z1332 Encounter for screening for maternal depression: Secondary | ICD-10-CM | POA: Diagnosis not present

## 2023-10-08 DIAGNOSIS — G40804 Other epilepsy, intractable, without status epilepticus: Secondary | ICD-10-CM

## 2023-10-08 DIAGNOSIS — O09299 Supervision of pregnancy with other poor reproductive or obstetric history, unspecified trimester: Secondary | ICD-10-CM

## 2023-10-08 DIAGNOSIS — O09291 Supervision of pregnancy with other poor reproductive or obstetric history, first trimester: Secondary | ICD-10-CM | POA: Diagnosis not present

## 2023-10-08 DIAGNOSIS — O0991 Supervision of high risk pregnancy, unspecified, first trimester: Secondary | ICD-10-CM

## 2023-10-08 MED ORDER — ASPIRIN 81 MG PO TBEC: 162.0000 mg | DELAYED_RELEASE_TABLET | Freq: Every day | ORAL | 2 refills | Status: DC

## 2023-10-08 NOTE — Progress Notes (Signed)
 INITIAL PRENATAL VISIT  History:   Pamela Booker is a 28 y.o. [phone] at [redacted]w[redacted]d by LMP being seen today for her first obstetrical visit.  Her obstetrical history is significant for  term SVD .  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 Followed by Dr. Maxie Spaniel at [facility]. Also takes Valium  as needed for stress, usually around three times a week, as stress is a big trigger.  She is worried about seizures during labor, wants a primary cesarean section around 37 weeks to avoid this. Planning for hysterectomy after this pregnancy due to history of Stage 3 endometriosis.  History of preeclampsia during last pregnancy, had HTN for a few years afterwards and PVCs, but has been off meds for two years. Pregnancy history fully reviewed.  Patient reports  abnormal vaginal discharge for a few days, wants evaluation .     HISTORY: OB History  Gravida Para Term Preterm AB Living  5 1 1  0 3 1  SAB IAB Ectopic Multiple Live Births  3 0 0 0 1    # Outcome Date GA Lbr Len/2nd Weight Sex Type Anes PTL Lv  5 Current           4 SAB 05/2023          3 SAB 2021     Biochemical     2 SAB 2019          1 Term 10/17/13 [redacted]w[redacted]d  6 lb 2 oz (2.778 kg) F Vag-Spont   LIV     Complications: Preeclampsia    Last pap smear was done 10/16/2022 and was normal  Past Medical History:  Diagnosis Date   Anxiety    Anxiety disorder 07/17/2022   Atypical chest pain 04/25/2016   Bilateral nephrolithiasis    Breakthrough seizure (HCC) 07/05/2021   Chronic pain syndrome 06/11/2023   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     EAB 11/19.   Started back on continuous nuvaring thereafter  1/20- excessive bleeding. U/s with 3cm EMS. Placed on provera 10mg  bid in addition to nuvaring.  Encourage progesterone  taper. Have full menses at end of 3wks of Nuvaring.   2/20- u/s with resolved thickening of EMS to 4mm  Restart continuous nuvaring   EAB 11/19.   Sta   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/04/2022   Formatting of this note might be different from the original. Pap 3/22- LGSIL. Colpo 4/22- CIN 1 Pap 10/23- negative     Migraine headache 07/05/2021   Last Assessment & Plan:     As needed Fioricet  for now     Migraine without aura and responsive to treatment 12/11/2014   Migraines    Nausea & vomiting 11/23/2022   Ovarian cyst    Palpitations    Partial epilepsy with impairment of consciousness (HCC) 06/06/2022   PVC (premature ventricular contraction) 03/11/2018   Seizures (HCC)    last seizure was 2  weeks ago (app.12-18-22)   Shortness of breath 04/25/2016   Status post placement of VNS (vagus nerve stimulation) device    Syncope and collapse 06/10/2019   TB lung, 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     Longstanding. Reports occasional tearing and discomfort with sex.   She would like hormones checked.  She states that her estrogen level was low several years ago after a multiyear course of Depo-Provera.  Labs with low estradiol , FSH, and LH. Normal prolactin & TSH.[name]  Discussed increasing her estrogen level by changing to a higher    Past Surgical History:  Procedure Laterality Date   CYSTOSCOPY W/ URETERAL STENT REMOVAL Left 01/09/2023   Procedure: CYSTOSCOPY WITH STENT REMOVAL;   Surgeon: Geraline Knapp, MD;  Location: [facility] ORS;  Service: Urology;  Laterality: Left;   CYSTOSCOPY/URETEROSCOPY/HOLMIUM LASER/STENT PLACEMENT Left 01/02/2023   Procedure: CYSTOSCOPY/URETEROSCOPY/HOLMIUM LASER/STENT PLACEMENT;  Surgeon: Geraline Knapp, MD;  Location: [facility];  Service: Urology;  Laterality: Left;   CYSTOSCOPY/URETEROSCOPY/HOLMIUM LASER/STENT PLACEMENT Right 01/09/2023   Procedure: CYSTOSCOPY/URETEROSCOPY/HOLMIUM LASER/STENT PLACEMENT;  Surgeon: Geraline Knapp, MD;  Location: [facility] OR[facility];  Service: Urology;  Laterality: Right;   DILATION AND EVACUATION N/A 05/17/2023   Procedure: DILATATION AND EVACUATION;  Surgeon: Raynell Caller, MD;  Location: [facility];  Service: Gynecology;  Laterality: N/A;   ENDOMETRIAL ABLATION  06/2022   [facility]   LAPAROSCOPY     x5 for endometriosis   LOOP RECORDER INSERTION  2020   LOOP RECORDER REMOVAL  2021   TONSILLECTOMY     URETEROSCOPY Left 01/09/2023   Procedure: URETEROSCOPY;  Surgeon: Geraline Knapp, MD;  Location: [facility];  Service: Urology;  Laterality: Left;   VAGUS NERVE STIMULATOR INSERTION  2022   Family History  Adopted: Yes  Problem Relation Age of Onset   Healthy Daughter    Social History   Tobacco Use   Smoking status: Never   Smokeless tobacco: Never  Vaping Use   Vaping status: Former   Substances: Nicotine  Substance Use Topics   Alcohol use: Not Currently    Comment: socially   Drug use: Not Currently    Comment: THC   Allergies  Allergen Reactions   Zolpidem Other (See Comments)    seizure  Other Reaction(s): Other (See Comments)    seizure   Diphenhydramine  Other (See Comments)    "Crawling feeling under skin"  "Crawling feeling under skin"    "It feels as if I have things crawling on my skin."  "It feels as if I have things crawling on my skin."     "Crawling feeling under skin"    "It feels as if I have things crawling on my skin."   "Crawling feeling under skin"    "Crawling feeling  under skin"  "It feels as if I have things crawling on my skin."   Ibuprofen Other (See Comments)    Other reaction(s): GI Upset (intolerance)  Other reaction(s): GI Upset (intolerance) Other reaction(s): GI Upset (intolerance)    Other reaction(s): GI Upset (intolerance)    "I have stomach ulcers and I get really bad cramping.  Other reaction(s): GI Upset (intolerance)    "I have stomach ulcers and I get really bad cramping.    Other reaction(s): GI Upset (intolerance) Other reaction(s): GI Upset (intolerance)  Other reaction(s): GI Upset (intolerance) Other reaction(s): GI Upset (intolerance)  Other reaction(s): GI Upset (intolerance)  "I have stomach ulcers and I get really bad cramping.   Nsaids Other (See Comments)    History of bleeding ulcers   Compazine  [Prochlorperazine ]    Keppra  [Levetiracetam ] Other (See Comments)    "Makes her feel hungover"    Phenergan  [Promethazine ]    Emgality [Galcanezumab-Gnlm] Rash   Lorazepam  Itching   Current Outpatient Medications on File Prior to Visit  Medication Sig Dispense Refill   diazepam  (VALIUM ) 10 MG tablet Take 1 tablet (10 mg total) by mouth every 6 (six) hours as needed (Seizure/aura). 30 tablet 0   escitalopram  (LEXAPRO ) 20 MG tablet Take 20 mg by mouth daily.     folic acid (FOLVITE) 1 MG tablet Take 4 mg by mouth daily.     Heating Pads PADS 1 Pad by Does not apply route as needed (cramping).     OXcarbazepine  (TRILEPTAL ) 150 MG tablet Take 4 tablets (600 mg total) by mouth 2 (two) times daily.     Prenatal Vit-Fe Fumarate-FA (MULTIVITAMIN-PRENATAL) 27-0.8 MG TABS tablet Take 2 tablets by mouth daily at 12 noon.     valACYclovir  (VALTREX ) 500 MG tablet Take 500 mg by mouth daily as needed (Cold sores).     No current facility-administered medications on file prior to visit.   Review of Systems Pertinent items noted in HPI and remainder of comprehensive ROS otherwise negative.  Physical Exam:   Vitals:   10/08/23  1524  BP: 122/83  Pulse: 76  Weight: 154 lb 3.2 oz (69.9 kg)   Bedside Ultrasound for FHR check: Viable intrauterine pregnancy with positive cardiac activity noted, fetal heart rate 150s bpm General: well-developed, well-nourished female in no acute distress  Breasts:  deferred  Skin: normal coloration and turgor, no rashes  Neurologic: oriented, normal, negative, normal mood  Extremities: normal strength, tone, and muscle mass, ROM of all joints is normal  HEENT PERRLA, extraocular movement intact and sclera clear, anicteric  Neck supple and no masses  Cardiovascular: regular rate and rhythm  Respiratory:  no respiratory distress, normal breath sounds  Abdomen: soft, non-tender; bowel sounds normal; no masses,  no organomegaly  Pelvic: Deferred, self-swab done by patient    Assessment:    Pregnancy: [id number] Patient Active Problem List   Diagnosis Date Noted   Long-term current use of benzodiazepine 10/08/2023   Supervision of high-risk pregnancy 04/27/2023   Epilepsy (HCC) 07/05/2021   Hx of preeclampsia, prior pregnancy, currently pregnant 07/05/2021   S/P placement of VNS (vagus nerve stimulation) device 05/06/2019   PVC (premature ventricular contraction) 03/11/2018     Plan:    1. 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 S/P placement of VNS (vagus nerve stimulation) device Continue Trileptal , Neurologist to manage during pregnancy. Seizure precautions reviewed. MFM consult done.  Patient aware that our practice is hesitant to perform primary cesarean sections, but if this is recommended by MFM and Neurology due to concern about seizures in labor, this will be scheduled accordingly. - AMB referral to maternal fetal medicine - US  MFM OB DETAIL +14 WK; Future   3. Hx of preeclampsia, prior pregnancy, currently pregnant History of preeclampsia during 2015 pregnancy, had HTN and PVCs afterwards, was on Acebutolol . No meds in two years, rare  PVCs now.  Aspirin  prescribed. Referred to Cardio OB, will follow recommendations. - aspirin  EC 81 MG tablet; Take 2 tablets (162 mg total) by mouth at bedtime.  Start taking when you are [redacted] weeks pregnant for rest of pregnancy for prevention of preeclampsia  Dispense: 300 tablet; Refill: 2 - AMB Referral to Cardio Obstetrics  4. Long-term current use of benzodiazepine Continue Valium  as needed. Patient is aware of risk of NAS.  5. Vaginal discharge during pregnancy in first trimester - Cervicovaginal ancillary only done, will follow up results and manage accordingly.  6. [redacted] weeks gestation of pregnancy 7. Supervision of high risk pregnancy in first trimester - CBC/D/Plt+RPR+Rh+ABO+RubIgG... - Hemoglobin A1c - Culture, OB Urine - PANORAMA PRENATAL TEST - HORIZON CUSTOM - TSH Rfx on Abnormal to Free T4 - Comprehensive metabolic panel with GFR - aspirin  EC 81 MG tablet; Take 2 tablets (162 mg total) by mouth at bedtime. Start taking when you are [redacted] weeks pregnant for rest of pregnancy for prevention of preeclampsia  Dispense: 300 tablet; Refill: 2  Initial labs drawn. Continue prenatal vitamins. Problem list reviewed and updated. Genetic Screening discussed, Panorama and Horizon: ordered. Ultrasound discussed; fetal anatomic survey: scheduled. Anticipatory guidance about prenatal visits given including labs, ultrasounds, and testing. Weight gain recommendations per IOM guidelines reviewed: underweight/BMI 18.5 or less > 28 - 40 lbs; normal weight/BMI 18.5 - 24.9 > 25 - 35 lbs; overweight/BMI 25 - 29.9 > 15 - 25 lbs; obese/BMI 30 or more > 11 - 20 lbs. Patient was encouraged to use MyChart to review results, send requests, and have questions addressed.   The nature of Center for [facility]ulty [facility] with multiple MDs and Advanced Practice Providers was explained to patient; also emphasized that residents, students are part of our team. Routine obstetric precautions reviewed.  Encouraged to seek out care at our office or emergency room [facility] MAU preferred) for urgent and/or emergent concerns. Return in about 4 weeks (around 11/05/2023) for OFFICE OB VISIT (MD only).      Lenoard Rad, MD, FACOG Obstetrician & Gynecologist, [facility] for [employer], [facility]

## 2023-10-09 ENCOUNTER — Encounter (HOSPITAL_COMMUNITY): Payer: Self-pay | Admitting: Obstetrics & Gynecology

## 2023-10-09 ENCOUNTER — Inpatient Hospital Stay (HOSPITAL_COMMUNITY)
Admission: AD | Admit: 2023-10-09 | Discharge: 2023-10-09 | Disposition: A | Discharge status: Home / Self Care | Attending: Obstetrics & Gynecology | Admitting: Obstetrics & Gynecology

## 2023-10-09 ENCOUNTER — Encounter: Payer: Self-pay | Admitting: Obstetrics & Gynecology

## 2023-10-09 DIAGNOSIS — O210 Mild hyperemesis gravidarum: Secondary | ICD-10-CM | POA: Diagnosis present

## 2023-10-09 DIAGNOSIS — Z79899 Other long term (current) drug therapy: Secondary | ICD-10-CM | POA: Insufficient documentation

## 2023-10-09 DIAGNOSIS — G43809 Other migraine, not intractable, without status migrainosus: Secondary | ICD-10-CM

## 2023-10-09 DIAGNOSIS — R519 Headache, unspecified: Secondary | ICD-10-CM | POA: Insufficient documentation

## 2023-10-09 DIAGNOSIS — G43909 Migraine, unspecified, not intractable, without status migrainosus: Secondary | ICD-10-CM | POA: Diagnosis present

## 2023-10-09 DIAGNOSIS — Z3A10 10 weeks gestation of pregnancy: Secondary | ICD-10-CM | POA: Diagnosis not present

## 2023-10-09 LAB — URINALYSIS, ROUTINE W REFLEX MICROSCOPIC
Bilirubin Urine: NEGATIVE
Glucose, UA: >=500 mg/dL — AB
Hgb urine dipstick: NEGATIVE
Ketones, ur: 80 mg/dL — AB
Nitrite: NEGATIVE
Protein, ur: NEGATIVE mg/dL
Specific Gravity, Urine: 1.023 (ref 1.005–1.030)
pH: 5 (ref 5.0–8.0)

## 2023-10-09 LAB — COMPREHENSIVE METABOLIC PANEL WITH GFR
ALT: 26 U/L (ref 0–44)
AST: 21 U/L (ref 15–41)
Albumin: 3.9 g/dL (ref 3.5–5.0)
Alkaline Phosphatase: 47 U/L (ref 38–126)
Anion gap: 11 (ref 5–15)
BUN: 6 mg/dL (ref 6–20)
CO2: 22 mmol/L (ref 22–32)
Calcium: 9.4 mg/dL (ref 8.9–10.3)
Chloride: 104 mmol/L (ref 98–111)
Creatinine, Ser: 0.56 mg/dL (ref 0.44–1.00)
GFR, Estimated: >60 mL/min (ref >60)
Glucose, Bld: 88 mg/dL (ref 70–99)
Potassium: 3.7 mmol/L (ref 3.5–5.1)
Sodium: 137 mmol/L (ref 135–145)
Total Bilirubin: 0.6 mg/dL (ref 0.0–1.2)
Total Protein: 6.7 g/dL (ref 6.5–8.1)

## 2023-10-09 LAB — CBC
HCT: 42.4 % (ref 36.0–46.0)
Hemoglobin: 15.1 g/dL — ABNORMAL HIGH (ref 12.0–15.0)
MCH: 33.2 pg (ref 26.0–34.0)
MCHC: 35.6 g/dL (ref 30.0–36.0)
MCV: 93.2 fL (ref 80.0–100.0)
Platelets: 272 10*3/uL (ref 150–400)
RBC: 4.55 MIL/uL (ref 3.87–5.11)
RDW: 12.4 % (ref 11.5–15.5)
WBC: 8.7 10*3/uL (ref 4.0–10.5)
nRBC: 0 % (ref 0.0–0.2)

## 2023-10-09 LAB — POCT FERN TEST: POCT Fern Test: NEGATIVE

## 2023-10-09 LAB — TSH RFX ON ABNORMAL TO FREE T4: TSH: 0.698 u[IU]/mL (ref 0.450–4.500)

## 2023-10-09 MED ORDER — SODIUM CHLORIDE 0.9 % IV SOLN
8.0000 mg | Freq: Once | INTRAVENOUS | Status: AC
  Administered 2023-10-09: 8 mg via INTRAVENOUS
  Filled 2023-10-09: qty 4

## 2023-10-09 MED ORDER — SODIUM CHLORIDE 0.9 % IV BOLUS
1000.0000 mL | Freq: Once | INTRAVENOUS | Status: AC
  Administered 2023-10-09: 1000 mL via INTRAVENOUS

## 2023-10-09 MED ORDER — HYDROMORPHONE HCL 1 MG/ML IJ SOLN: 1.0000 mg | Freq: Once | INTRAMUSCULAR | Status: DC

## 2023-10-09 MED ORDER — ACETAMINOPHEN 325 MG PO TABS
650.0000 mg | ORAL_TABLET | Freq: Once | ORAL | Status: AC
  Administered 2023-10-09: 650 mg via ORAL
  Filled 2023-10-09: qty 2

## 2023-10-09 MED ORDER — ONDANSETRON 4 MG PO TBDP: 4.0000 mg | ORAL_TABLET | Freq: Four times a day (QID) | ORAL | 1 refills | Status: DC | PRN

## 2023-10-09 NOTE — MAU Provider Note (Addendum)
 History     CSN: [id number]  Arrival date and time: 10/09/23 1014   Event Date/Time   First Provider Initiated Contact with Patient 10/09/23 1050      Chief Complaint  Patient presents with   Emesis   Nausea   Headache   HPI Pamela Booker is a 28 y.o. female G5P1031 at [redacted]w[redacted]d with a past history of migraines, epilepsy, stage 3 endometriosis, and kidney stones who presents to the maternal assessment unit with a 1 day history of severe headache and accompanied nausea and vomiting. She states being in her usual state of health until yesterday. She reports waking up yesterday morning with a slight headache that worsened after her 10 week pregnancy appointment (after drawing blood for labs). Since then, she describes having a constant pounding headache rated 10/10 pain (with 10 being the most severe). She states that the headache is centered around her temples, but is also defuse at times. She reports taking max dose of acetaminophen  yesterday for the pain, but denies any beneficial effect on her pain. She also endorsed nausea and vomiting over the last 24 hours. She states that her nausea started yesterday evening after dinner and has been intermittent ever since. Since onset, she has been unable to keep any liquids or solids down, including medication. She describes her vomit as liquid with predominantly bile and foam. She denies any blood in her vomit. She reports taking Zofran  last night for the nausea, but denies any beneficial effect. She has not been able to keep any liquids or solids down and has been concerned about her hydration status. She reports feeling dehydrated prior to the onset of her symptoms, but has been unable to hydrate because of her nausea and vomiting. She denied other symptoms such as fever, abdominal pain, chest pain, shortness of breath, cough, diarrhea, or constipation.     Emesis  Associated symptoms include dizziness and headaches. Pertinent negatives include no  abdominal pain, chest pain, chills, coughing, diarrhea, fever or myalgias.  Headache  Associated symptoms include dizziness, nausea and vomiting. Pertinent negatives include no abdominal pain, coughing, fever, hearing loss, sore throat or tinnitus.    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     EAB 11/19.   Started back on continuous nuvaring thereafter  1/20- excessive bleeding. U/s with 3cm EMS. Placed on provera 10mg  bid in addition  to nuvaring.  Encourage progesterone  taper. Have full menses at end of 3wks of Nuvaring.   2/20- u/s with resolved thickening of EMS to 4mm  Restart continuous nuvaring   EAB 11/19.   Sta   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/04/2022   Formatting of this note might be different from the original. Pap 3/22- LGSIL. 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     Longstanding. Reports occasional tearing and discomfort with sex.   She would like hormones checked.  She states that her estrogen level was low several years ago after a multiyear course of Depo-Provera.  Labs with low estradiol , FSH, and LH. Normal prolactin & TSH.[name]  Discussed increasing her estrogen level by changing to a higher     Past Surgical History:  Procedure Laterality Date   CYSTOSCOPY W/ URETERAL STENT REMOVAL Left 01/09/2023   Procedure: CYSTOSCOPY WITH STENT REMOVAL;  Surgeon: Geraline Knapp, MD;  Location: [facility];  Service: Urology;  Laterality: Left;   CYSTOSCOPY/URETEROSCOPY/HOLMIUM LASER/STENT PLACEMENT Left 01/02/2023   Procedure: CYSTOSCOPY/URETEROSCOPY/HOLMIUM LASER/STENT PLACEMENT;  Surgeon: Geraline Knapp, MD;  Location: [facility];  Service: Urology;  Laterality: Left;   CYSTOSCOPY/URETEROSCOPY/HOLMIUM LASER/STENT PLACEMENT Right 01/09/2023   Procedure: CYSTOSCOPY/URETEROSCOPY/HOLMIUM LASER/STENT PLACEMENT;  Surgeon: Geraline Knapp, MD;  Location: [facility];  Service: Urology;  Laterality: Right;   DILATION AND EVACUATION N/A 05/17/2023   Procedure: DILATATION AND EVACUATION;  Surgeon: Raynell Caller, MD;  Location: [facility];  Service: Gynecology;  Laterality: N/A;   ENDOMETRIAL ABLATION  06/2022   [facility]   LAPAROSCOPY     x5 for endometriosis   LOOP RECORDER INSERTION  2020   LOOP RECORDER REMOVAL  2021   TONSILLECTOMY     URETEROSCOPY Left 01/09/2023    Procedure: URETEROSCOPY;  Surgeon: Geraline Knapp, MD;  Location: [facility];  Service: Urology;  Laterality: Left;   VAGUS NERVE STIMULATOR INSERTION  2022    Family History  Adopted: Yes  Problem Relation Age of Onset   Healthy Daughter     Social History   Tobacco Use   Smoking status: Never   Smokeless tobacco: Never  Vaping Use   Vaping status: Former   Substances: Nicotine  Substance Use Topics   Alcohol use: Not Currently    Comment: socially   Drug use: Not Currently    Comment: THC    Allergies:  Allergies  Allergen Reactions   Zolpidem Other (See Comments)    seizure  Other Reaction(s): Other (See Comments)    seizure   Diphenhydramine  Other (See Comments)    "Crawling feeling under skin"  "  Crawling feeling under skin"    "It feels as if I have things crawling on my skin."  "It feels as if I have things crawling on my skin."     "Crawling feeling under skin"    "It feels as if I have things crawling on my skin."   "Crawling feeling under skin"    "Crawling feeling under skin"  "It feels as if I have things crawling on my skin."   Ibuprofen Other (See Comments)    Other reaction(s): GI Upset (intolerance)  Other reaction(s): GI Upset (intolerance) Other reaction(s): GI Upset (intolerance)    Other reaction(s): GI Upset (intolerance)    "I have stomach ulcers and I get really bad cramping.  Other reaction(s): GI Upset (intolerance)    "I have stomach ulcers and I get really bad cramping.    Other reaction(s): GI Upset (intolerance) Other reaction(s): GI Upset (intolerance)    Other reaction(s): GI Upset (intolerance) Other reaction(s): GI Upset (intolerance)  Other reaction(s): GI Upset (intolerance)  "I have stomach ulcers and I get really bad cramping.   Nsaids Other (See Comments)    History of bleeding ulcers   Compazine  [Prochlorperazine ]    Keppra  Colby.Colonel ] Other (See Comments)    "Makes her feel hungover"    Phenergan   [Promethazine ]    Emgality [Galcanezumab-Gnlm] Rash   Lorazepam  Itching    Medications Prior to Admission  Medication Sig Dispense Refill Last Dose/Taking   diazepam  (VALIUM ) 10 MG tablet Take 1 tablet (10 mg total) by mouth every 6 (six) hours as needed (Seizure/aura). 30 tablet 0 10/08/2023   escitalopram  (LEXAPRO ) 20 MG tablet Take 20 mg by mouth daily.   10/08/2023   folic acid (FOLVITE) 1 MG tablet Take 4 mg by mouth daily.   10/08/2023   OXcarbazepine  (TRILEPTAL ) 150 MG tablet Take 4 tablets (600 mg total) by mouth 2 (two) times daily.   10/08/2023   Prenatal Vit-Fe Fumarate-FA (MULTIVITAMIN-PRENATAL) 27-0.8 MG TABS tablet Take 2 tablets by mouth daily at 12 noon.   10/08/2023   valACYclovir  (VALTREX ) 500 MG tablet Take 500 mg by mouth daily as needed (Cold sores).   10/08/2023   aspirin  EC 81 MG tablet Take 2 tablets (162 mg total) by mouth at bedtime. Start taking when you are [redacted] weeks pregnant for rest of pregnancy for prevention of preeclampsia 300 tablet 2    Heating Pads PADS 1 Pad by Does not apply route as needed (cramping).       Review of Systems  Constitutional:  Positive for diaphoresis. Negative for chills and fever.  HENT:  Negative for hearing loss, sore throat and tinnitus.   Respiratory:  Negative for cough, shortness of breath and wheezing.   Cardiovascular:  Negative for chest pain and palpitations.  Gastrointestinal:  Positive for nausea and vomiting. Negative for abdominal pain, constipation and diarrhea.  Genitourinary:  Negative for dyspareunia, dysuria, flank pain, hematuria and urgency.  Musculoskeletal:  Negative for myalgias.  Neurological:  Positive for dizziness and headaches. Negative for light-headedness.   Physical Exam   Blood pressure 117/71, pulse 89, temperature 97.6 F (36.4 C), temperature source Oral, resp. rate 18, height 5\' 2"  (1.575 m), weight 69.9 kg, last menstrual period 07/27/2023, SpO2 96%.  Physical Exam HENT:     Head: Normocephalic and  atraumatic.  Eyes:     Extraocular Movements: Extraocular movements intact.  Cardiovascular:     Rate and Rhythm: Normal rate and regular rhythm.     Heart sounds: Normal  heart sounds.  Pulmonary:     Effort: Pulmonary effort is normal.     Breath sounds: Normal breath sounds.  Abdominal:     Palpations: Abdomen is soft.     Tenderness: There is no abdominal tenderness. There is no guarding.  Musculoskeletal:     Cervical back: Normal range of motion.  Neurological:     Mental Status: She is alert and oriented to person, place, and time.    FHTs 150s  MAU Course  Procedures  MDM - Started IV sodium chloride  0.9% bolus 1000 mL to address patient's dehydrated status - Started ondansetron  8 mg in sodium chloride  0.9% 50 ml to address patient's nausea and vomiting - Started acetaminophen  650 mg tablet once to address patient's headache - Ordered CBC, CMP, and UA to check patient's blood counts, electrolyte status, and hydration status  Assessment and Plan  Coda Misko is a 28 y.o. female [phone] at [redacted]w[redacted]d with a past history of migraines and epilepsy who presents to the maternal assessment unit with a 1 day history of severe headache, nausea, vomiting, limited oral intake, and light sensitivity suggestive of a migraine episode. Patient was started on IV fluids, antiemetic, and pain medication for symptomatic management of her condition. Headache now resolved, labs unremarkable  Discharge home Dehydration precautions discussed Patient may return to MAU as needed or if her condition were to change or worsen   Park Bolk 10/09/2023, 11:40 AM   Attestation of Attending Supervision of Provider:  Evaluation and management procedures were performed by this provider under my supervision and collaboration. I have reviewed the provider's note and chart, and I agree with the management and plan. Of note, fern test is an erroneous entry by the RN, which pertains to another patient.    Hines Ludwig, MD Faculty Practice, [facility]

## 2023-10-09 NOTE — MAU Note (Signed)
 MAU Triage Note:  .Pamela Booker is a 28 y.o. at [redacted]w[redacted]d here in MAU reporting: nausea and vomiting that has been on-going. Has not been able to keep anything down today - tried pedialyte and came back up. She reports 6-7 lbs since the start of her pregnancy. She is also complaining of a headache that is pounding and gets slightly better after emesis. Denies VB or LOF.   Tried to take tylenol , but threw up shortly after.   Patient complaint: NAUSEA,VOMITING,MIGRAINE  Pain Score: 10-Worst pain ever Pain Location: Abdomen     Onset of complaint: on-going LMP: Patient's last menstrual period was 07/27/2023.  Vitals:   10/09/23 1029  BP: 117/71  Pulse: 89  Resp: 18  Temp: 97.6 F (36.4 C)  SpO2: 96%    FHT:   Deferred in triage, patient in wheelchair actively vomiting. Lab orders placed from triage: UA

## 2023-10-10 ENCOUNTER — Encounter: Payer: Self-pay | Admitting: Obstetrics & Gynecology

## 2023-10-10 LAB — COMPREHENSIVE METABOLIC PANEL WITH GFR
ALT: 22 IU/L (ref 0–32)
AST: 15 IU/L (ref 0–40)
Albumin: 4.3 g/dL (ref 4.0–5.0)
Alkaline Phosphatase: 72 IU/L (ref 44–121)
BUN/Creatinine Ratio: 14 (ref 9–23)
BUN: 7 mg/dL (ref 6–20)
Bilirubin Total: <0.2 mg/dL (ref 0.0–1.2)
CO2: 20 mmol/L (ref 20–29)
Calcium: 9.7 mg/dL (ref 8.7–10.2)
Chloride: 101 mmol/L (ref 96–106)
Creatinine, Ser: 0.5 mg/dL — ABNORMAL LOW (ref 0.57–1.00)
Globulin, Total: 2 g/dL (ref 1.5–4.5)
Glucose: 96 mg/dL (ref 70–99)
Potassium: 4 mmol/L (ref 3.5–5.2)
Sodium: 136 mmol/L (ref 134–144)
Total Protein: 6.3 g/dL (ref 6.0–8.5)
eGFR: 130 mL/min/{1.73_m2} (ref >59)

## 2023-10-10 LAB — CBC/D/PLT+RPR+RH+ABO+RUBIGG...
Antibody Screen: NEGATIVE
Basophils Absolute: 0 10*3/uL (ref 0.0–0.2)
Basos: 0 %
EOS (ABSOLUTE): 0.1 10*3/uL (ref 0.0–0.4)
Eos: 1 %
HCV Ab: NONREACTIVE
HIV Screen 4th Generation wRfx: NONREACTIVE
Hematocrit: 43.4 % (ref 34.0–46.6)
Hemoglobin: 14.6 g/dL (ref 11.1–15.9)
Hepatitis B Surface Ag: NEGATIVE
Immature Grans (Abs): 0 10*3/uL (ref 0.0–0.1)
Immature Granulocytes: 0 %
Lymphocytes Absolute: 2.4 10*3/uL (ref 0.7–3.1)
Lymphs: 31 %
MCH: 32.5 pg (ref 26.6–33.0)
MCHC: 33.6 g/dL (ref 31.5–35.7)
MCV: 97 fL (ref 79–97)
Monocytes Absolute: 0.5 10*3/uL (ref 0.1–0.9)
Monocytes: 7 %
Neutrophils Absolute: 4.7 10*3/uL (ref 1.4–7.0)
Neutrophils: 61 %
Platelets: 246 10*3/uL (ref 150–450)
RBC: 4.49 x10E6/uL (ref 3.77–5.28)
RDW: 12.5 % (ref 11.7–15.4)
RPR Ser Ql: NONREACTIVE
Rh Factor: POSITIVE
Rubella Antibodies, IGG: 1.41 {index} (ref >0.99)
WBC: 7.6 10*3/uL (ref 3.4–10.8)

## 2023-10-10 LAB — CERVICOVAGINAL ANCILLARY ONLY
Bacterial Vaginitis (gardnerella): NEGATIVE
Candida Glabrata: NEGATIVE
Candida Vaginitis: NEGATIVE
Chlamydia: NEGATIVE
Comment: NEGATIVE
Comment: NEGATIVE
Comment: NEGATIVE
Comment: NEGATIVE
Comment: NEGATIVE
Comment: NORMAL
Neisseria Gonorrhea: NEGATIVE
Trichomonas: NEGATIVE

## 2023-10-10 LAB — HEMOGLOBIN A1C
Est. average glucose Bld gHb Est-mCnc: 94 mg/dL
Hgb A1c MFr Bld: 4.9 % (ref 4.8–5.6)

## 2023-10-10 LAB — HCV INTERPRETATION

## 2023-10-10 LAB — CULTURE, OB URINE

## 2023-10-10 LAB — URINE CULTURE, OB REFLEX: Organism ID, Bacteria: NO GROWTH

## 2023-10-11 ENCOUNTER — Encounter: Payer: Self-pay | Admitting: Obstetrics & Gynecology

## 2023-10-11 DIAGNOSIS — O210 Mild hyperemesis gravidarum: Secondary | ICD-10-CM

## 2023-10-11 HISTORY — DX: Mild hyperemesis gravidarum: O21.0

## 2023-10-11 NOTE — Addendum Note (Signed)
 Addended by: Lenoard Rad A on: 10/11/2023 10:58 AM   Modules accepted: Orders

## 2023-10-13 ENCOUNTER — Encounter: Payer: Self-pay | Admitting: Obstetrics & Gynecology

## 2023-10-13 LAB — PANORAMA PRENATAL TEST FULL PANEL:PANORAMA TEST PLUS 5 ADDITIONAL MICRODELETIONS: FETAL FRACTION: 15.4

## 2023-10-15 LAB — HORIZON CUSTOM: REPORT SUMMARY: NEGATIVE

## 2023-10-17 ENCOUNTER — Ambulatory Visit: Payer: Self-pay | Admitting: Obstetrics & Gynecology

## 2023-10-17 DIAGNOSIS — O0991 Supervision of high risk pregnancy, unspecified, first trimester: Secondary | ICD-10-CM

## 2023-10-23 ENCOUNTER — Other Ambulatory Visit: Payer: Self-pay

## 2023-10-23 ENCOUNTER — Ambulatory Visit (INDEPENDENT_AMBULATORY_CARE_PROVIDER_SITE_OTHER): Admitting: *Deleted

## 2023-10-23 VITALS — BP 110/75 | HR 82 | Temp 98.4°F | Resp 18 | Ht 62.0 in | Wt 155.2 lb

## 2023-10-23 DIAGNOSIS — Z3A12 12 weeks gestation of pregnancy: Secondary | ICD-10-CM

## 2023-10-23 DIAGNOSIS — O210 Mild hyperemesis gravidarum: Secondary | ICD-10-CM

## 2023-10-23 MED ORDER — THIAMINE HCL 100 MG/ML IJ SOLN
INTRAVENOUS | Status: DC
  Filled 2023-10-23: qty 1000

## 2023-10-23 NOTE — Progress Notes (Signed)
 Diagnosis: Hyperemesis Gravidarum  Provider:  Praveen Mannam MD  Procedure: IV Infusion  IV Type: Peripheral, IV Location: R Antecubital  Banana Bag, Dose: 1000 ml  Infusion Start Time: 1428  Infusion Stop Time: 1643  Post Infusion IV Care: Peripheral IV Discontinued  Discharge: Condition: Good, Destination: Home . AVS Declined  Performed by:  Hal Levans

## 2023-10-24 LAB — COMPREHENSIVE METABOLIC PANEL WITH GFR
AG Ratio: 1.9 (calc) (ref 1.0–2.5)
ALT: 21 U/L (ref 6–29)
AST: 17 U/L (ref 10–30)
Albumin: 4.1 g/dL (ref 3.6–5.1)
Alkaline phosphatase (APISO): 50 U/L (ref 31–125)
BUN/Creatinine Ratio: 11 (calc) (ref 6–22)
BUN: 6 mg/dL — ABNORMAL LOW (ref 7–25)
CO2: 23 mmol/L (ref 20–32)
Calcium: 9.3 mg/dL (ref 8.6–10.2)
Chloride: 103 mmol/L (ref 98–110)
Creat: 0.53 mg/dL (ref 0.50–0.97)
Globulin: 2.2 g/dL (ref 1.9–3.7)
Glucose, Bld: 80 mg/dL (ref 65–99)
Potassium: 4 mmol/L (ref 3.5–5.3)
Sodium: 134 mmol/L — ABNORMAL LOW (ref 135–146)
Total Bilirubin: 0.3 mg/dL (ref 0.2–1.2)
Total Protein: 6.3 g/dL (ref 6.1–8.1)
eGFR: 128 mL/min/{1.73_m2} (ref >=60)

## 2023-10-25 ENCOUNTER — Ambulatory Visit: Payer: Self-pay | Admitting: Obstetrics & Gynecology

## 2023-11-05 ENCOUNTER — Other Ambulatory Visit: Payer: Self-pay

## 2023-11-05 ENCOUNTER — Ambulatory Visit: Admitting: Obstetrics & Gynecology

## 2023-11-05 VITALS — BP 108/72 | HR 81 | Wt 156.6 lb

## 2023-11-05 DIAGNOSIS — O99352 Diseases of the nervous system complicating pregnancy, second trimester: Secondary | ICD-10-CM

## 2023-11-05 DIAGNOSIS — I493 Ventricular premature depolarization: Secondary | ICD-10-CM | POA: Diagnosis not present

## 2023-11-05 DIAGNOSIS — O09292 Supervision of pregnancy with other poor reproductive or obstetric history, second trimester: Secondary | ICD-10-CM | POA: Diagnosis not present

## 2023-11-05 DIAGNOSIS — O210 Mild hyperemesis gravidarum: Secondary | ICD-10-CM

## 2023-11-05 DIAGNOSIS — Z3A14 14 weeks gestation of pregnancy: Secondary | ICD-10-CM

## 2023-11-05 DIAGNOSIS — O0991 Supervision of high risk pregnancy, unspecified, first trimester: Secondary | ICD-10-CM

## 2023-11-05 DIAGNOSIS — O99412 Diseases of the circulatory system complicating pregnancy, second trimester: Secondary | ICD-10-CM | POA: Diagnosis not present

## 2023-11-05 DIAGNOSIS — G40909 Epilepsy, unspecified, not intractable, without status epilepticus: Secondary | ICD-10-CM

## 2023-11-05 DIAGNOSIS — Z79899 Other long term (current) drug therapy: Secondary | ICD-10-CM

## 2023-11-05 DIAGNOSIS — O09299 Supervision of pregnancy with other poor reproductive or obstetric history, unspecified trimester: Secondary | ICD-10-CM

## 2023-11-05 NOTE — Progress Notes (Signed)
   PRENATAL VISIT NOTE  Subjective:  Pamela Booker is a 28 y.o. [phone] at [redacted]w[redacted]d being seen today for ongoing prenatal care.  She is currently monitored for the following issues for this high-risk pregnancy and has PVC (premature ventricular contraction); S/P placement of VNS (vagus nerve stimulation) device; Epilepsy (HCC); Migraine; Hx of preeclampsia, prior pregnancy, currently pregnant; Supervision of high-risk pregnancy; Long-term current use of benzodiazepine; and Hyperemesis gravidarum on their problem list.  Patient reports heartburn, nausea, and triggered by ASA as she has h/o NSAID related PUD.  Contractions: Not present. Vag. Bleeding: None.  Movement: Present. Denies leaking of fluid.   The following portions of the patient's history were reviewed and updated as appropriate: allergies, current medications, past family history, past medical history, past social history, past surgical history and problem list.   Objective:    Vitals:   11/05/23 1101  BP: 108/72  Pulse: 81  Weight: 156 lb 9.6 oz (71 kg)    Fetal Status:  Fetal Heart Rate (bpm): 150   Movement: Present    General: Alert, oriented and cooperative. Patient is in no acute distress.  Skin: Skin is warm and dry. No rash noted.   Cardiovascular: Normal heart rate noted  Respiratory: Normal respiratory effort, no problems with respiration noted  Abdomen: Soft, gravid, appropriate for gestational age.  Pain/Pressure: Absent     Pelvic: Cervical exam deferred        Extremities: Normal range of motion.  Edema: None  Mental Status: Normal mood and affect. Normal behavior. Normal judgment and thought content.   Assessment and Plan:  Pregnancy: G5P1031 at [redacted]w[redacted]d 1. Hyperemesis gravidarum (Primary)   2. Supervision of high risk pregnancy in first trimester Brief bedside US  done per request active FM seen  3. PVC (premature ventricular contraction)   4. Hx of preeclampsia, prior pregnancy, currently  pregnant Can't take ASA due to h/o NSAID induced PUD  5. Nonintractable epilepsy without status epilepticus, unspecified epilepsy type (HCC)   6. Long-term current use of benzodiazepine   Preterm labor symptoms and general obstetric precautions including but not limited to vaginal bleeding, contractions, leaking of fluid and fetal movement were reviewed in detail with the patient. Please refer to After Visit Summary for other counseling recommendations.   Return in about 4 weeks (around 12/03/2023).  Future Appointments  Date Time Provider Department Center  11/06/2023  2:00 PM [facility] 2 [facility] None  11/20/2023  2:00 PM [facility] 3 [facility] None  12/04/2023  4:15 PM Tresia Fruit, MD [facility]  12/12/2023  8:00 AM [facility] PROVIDER 1 [facility]  12/12/2023  8:30 AM [facility] US5 [facility]  12/21/2023  4:00 PM Tobb, Kardie, DO [facility] None  01/02/2024  4:15 PM Daisey Dryer, Marine Sia, MD [facility]    Onnie Bilis, MD

## 2023-11-06 ENCOUNTER — Ambulatory Visit

## 2023-11-06 VITALS — BP 109/77 | HR 73 | Temp 98.6°F | Resp 16 | Ht 62.0 in | Wt 159.0 lb

## 2023-11-06 DIAGNOSIS — O210 Mild hyperemesis gravidarum: Secondary | ICD-10-CM

## 2023-11-06 DIAGNOSIS — Z3A14 14 weeks gestation of pregnancy: Secondary | ICD-10-CM

## 2023-11-06 MED ORDER — THIAMINE HCL 100 MG/ML IJ SOLN
Freq: Once | INTRAVENOUS | Status: AC
  Filled 2023-11-06: qty 1000

## 2023-11-06 NOTE — Progress Notes (Signed)
 Diagnosis: Dehydration  Provider:  Praveen Mannam MD  Procedure: IV Infusion  IV Type: Peripheral, IV Location: Right wrist  Banana Bag, Dose: 1000 ml  Infusion Start Time: 1414  Infusion Stop Time: 1631  Post Infusion IV Care: Peripheral IV Discontinued  Discharge: Condition: Good, Destination: Home . AVS Declined  Performed by:  Shirly Dow, RN

## 2023-11-08 ENCOUNTER — Encounter: Payer: Self-pay | Admitting: Emergency Medicine

## 2023-11-08 ENCOUNTER — Ambulatory Visit
Admission: EM | Admit: 2023-11-08 | Discharge: 2023-11-08 | Disposition: A | Discharge status: Home / Self Care | Attending: Emergency Medicine | Admitting: Emergency Medicine

## 2023-11-08 DIAGNOSIS — J0140 Acute pansinusitis, unspecified: Secondary | ICD-10-CM

## 2023-11-08 LAB — RESP PANEL BY RT-PCR (FLU A&B, COVID) ARPGX2
Influenza A by PCR: NEGATIVE
Influenza B by PCR: NEGATIVE
SARS Coronavirus 2 by RT PCR: NEGATIVE

## 2023-11-08 LAB — GROUP A STREP BY PCR: Group A Strep by PCR: NOT DETECTED

## 2023-11-08 MED ORDER — CEFDINIR 300 MG PO CAPS: 300.0000 mg | ORAL_CAPSULE | Freq: Two times a day (BID) | ORAL | 0 refills | Status: AC

## 2023-11-08 NOTE — ED Provider Notes (Signed)
 [facility]    CSN: [id number] Arrival date & time: 11/08/23  1709      History   Chief Complaint Chief Complaint  Patient presents with   Headache   Sore Throat    HPI Pamela Booker is a 28 y.o. female.   HPI  28 year old female with past medical history significant for stage III endometriosis, chronic pain syndrome, seizures, kidney stones, anxiety disorder, atypical chest pain, epilepsy, migraine headaches, status post vagus nerve stimulator presents for evaluation of fever up to 100, headache, and sore throat.  She is currently [redacted] weeks pregnant.  She has been experiencing nasal congestion and sinus pain with epistaxis for the last 2 weeks.  This morning she woke up feeling much worse with a fever, left ear pain, and a nonproductive cough.  She denies shortness of breath or wheezing.  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     EAB 11/19.   Started back on continuous nuvaring thereafter  1/20- excessive bleeding. U/s with 3cm EMS. Placed on provera 10mg  bid in addition to nuvaring.  Encourage progesterone  taper. Have full menses at end of 3wks of Nuvaring.   2/20- u/s with resolved thickening of EMS to 4mm  Restart continuous nuvaring   EAB 11/19.   Sta   LGSIL on Pap smear of  cervix 04/04/2022   Formatting of this note might be different from the original. Pap 3/22- LGSIL. 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     Longstanding. Reports occasional tearing and discomfort with sex.   She would like hormones checked.  She states that her estrogen level was low several years ago after a multiyear course of Depo-Provera.  Labs with low estradiol , FSH, and LH. Normal prolactin & TSH.[name]  Discussed increasing her estrogen level by changing to a higher     Patient Active Problem List   Diagnosis Date Noted  Hyperemesis gravidarum 10/11/2023   Long-term current use of benzodiazepine 10/08/2023   Supervision of high-risk pregnancy 04/27/2023   Epilepsy (HCC) 07/05/2021   Migraine 07/05/2021   Hx of preeclampsia, prior pregnancy, currently pregnant 07/05/2021   S/P placement of VNS (vagus nerve stimulation) device 05/06/2019   PVC (premature ventricular contraction) 03/11/2018    Past Surgical History:  Procedure Laterality Date   CYSTOSCOPY W/ URETERAL STENT REMOVAL Left 01/09/2023   Procedure: CYSTOSCOPY WITH STENT REMOVAL;  Surgeon: Geraline Knapp, MD;  Location: [facility];  Service: Urology;  Laterality: Left;    CYSTOSCOPY/URETEROSCOPY/HOLMIUM LASER/STENT PLACEMENT Left 01/02/2023   Procedure: CYSTOSCOPY/URETEROSCOPY/HOLMIUM LASER/STENT PLACEMENT;  Surgeon: Geraline Knapp, MD;  Location: [facility];  Service: Urology;  Laterality: Left;   CYSTOSCOPY/URETEROSCOPY/HOLMIUM LASER/STENT PLACEMENT Right 01/09/2023   Procedure: CYSTOSCOPY/URETEROSCOPY/HOLMIUM LASER/STENT PLACEMENT;  Surgeon: Geraline Knapp, MD;  Location: [facility];  Service: Urology;  Laterality: Right;   DILATION AND EVACUATION N/A 05/17/2023   Procedure: DILATATION AND EVACUATION;  Surgeon: Raynell Caller, MD;  Location: [facility];  Service: Gynecology;  Laterality: N/A;   ENDOMETRIAL ABLATION  06/2022   [facility]   LAPAROSCOPY     x5 for endometriosis   LOOP RECORDER INSERTION  2020   LOOP RECORDER REMOVAL  2021   TONSILLECTOMY     URETEROSCOPY Left 01/09/2023   Procedure: URETEROSCOPY;  Surgeon: Geraline Knapp, MD;  Location: [facility];  Service: Urology;  Laterality: Left;   VAGUS NERVE STIMULATOR INSERTION  2022    OB History     Gravida  5   Para  1   Term  1   Preterm  0   AB  3   Living  1      SAB  3   IAB  0   Ectopic  0   Multiple  0   Live Births  1            Home Medications    Prior to Admission medications   Medication Sig Start Date End Date Taking? Authorizing Provider  cefdinir (OMNICEF) 300 MG capsule Take 1 capsule (300 mg total) by mouth 2 (two) times daily for 7 days. 11/08/23 11/15/23 Yes Kent Pear, NP  diazepam  (VALIUM ) 10 MG tablet Take 1 tablet (10 mg total) by mouth every 6 (six) hours as needed (Seizure/aura). 05/09/23  Yes Abner Ables, MD  escitalopram  (LEXAPRO ) 20 MG tablet Take 20 mg by mouth daily.   Yes [provider]  folic acid (FOLVITE) 1 MG tablet Take 4 mg by mouth daily.   Yes [provider]  Heating Pads PADS 1 Pad by Does not apply route as needed (cramping). 05/17/23  Yes Raynell Caller, MD  ondansetron  (ZOFRAN -ODT) 4 MG disintegrating  tablet Take 1 tablet (4 mg total) by mouth every 6 (six) hours as needed for nausea. 10/09/23  Yes Wouk, Haynes Lips, MD  OXcarbazepine  (TRILEPTAL ) 150 MG tablet Take 4 tablets (600 mg total) by mouth 2 (two) times daily. 09/14/23  Yes Walker, Deborha Falls R, CNM  Prenatal Vit-Fe Fumarate-FA (MULTIVITAMIN-PRENATAL) 27-0.8 MG TABS tablet Take 2 tablets by mouth daily at 12 noon.   Yes [provider]  valACYclovir  (VALTREX ) 500 MG tablet Take 500 mg by mouth daily as needed (Cold sores).   Yes [provider]    Family History Family History  Adopted: Yes  Problem Relation Age of Onset   Healthy Daughter     Social History Social History   Tobacco Use  Smoking status: Never   Smokeless tobacco: Never  Vaping Use   Vaping status: Former   Substances: Nicotine  Substance Use Topics   Alcohol use: Not Currently    Comment: socially   Drug use: Not Currently    Comment: THC     Allergies   Zolpidem, Diphenhydramine , Ibuprofen, Nsaids, Compazine  [prochlorperazine ], Keppra  [levetiracetam ], Phenergan  [promethazine ], Emgality [galcanezumab-gnlm], and Lorazepam    Review of Systems Review of Systems  Constitutional:  Positive for fever.  HENT:  Positive for congestion, ear pain, rhinorrhea, sinus pain and sore throat.   Respiratory:  Positive for cough. Negative for shortness of breath and wheezing.   Neurological:  Positive for headaches.     Physical Exam Triage Vital Signs ED Triage Vitals  Encounter Vitals Group     BP      Systolic BP Percentile      Diastolic BP Percentile      Pulse      Resp      Temp      Temp src      SpO2      Weight      Height      Head Circumference      Peak Flow      Pain Score      Pain Loc      Pain Education      Exclude from Growth Chart    No data found.  Updated Vital Signs BP 121/82 (BP Location: Left Arm)   Pulse 97   Temp 98.5 F (36.9 C) (Oral)   Ht 5\' 2"  (1.575 m)   Wt 154 lb (69.9 kg)   LMP  07/27/2023   SpO2 98%   BMI 28.17 kg/m   Visual Acuity Right Eye Distance:   Left Eye Distance:   Bilateral Distance:    Right Eye Near:   Left Eye Near:    Bilateral Near:     Physical Exam Vitals and nursing note reviewed.  Constitutional:      Appearance: Normal appearance. She is ill-appearing.  HENT:     Head: Normocephalic and atraumatic.     Right Ear: Tympanic membrane, ear canal and external ear normal. There is no impacted cerumen.     Left Ear: Tympanic membrane, ear canal and external ear normal. There is no impacted cerumen.     Nose: Congestion and rhinorrhea present.     Comments: Nasal mucosa is erythematous and edematous with thick yellow discharge in both nares as well as bloody discharge in the left nare.  Patient has tenderness to compression of bilateral frontal and maxillary sinuses.    Mouth/Throat:     Mouth: Mucous membranes are moist.     Pharynx: Oropharynx is clear. Posterior oropharyngeal erythema present. No oropharyngeal exudate.     Comments: Tonsillar pillars are unremarkable.  Posterior pharynx demonstrates erythema. Neck:     Comments: Tenderness to palpation of the anterior cervical region without appreciable lymphadenopathy. Cardiovascular:     Rate and Rhythm: Normal rate and regular rhythm.     Pulses: Normal pulses.     Heart sounds: Normal heart sounds. No murmur heard.    No friction rub. No gallop.  Pulmonary:     Effort: Pulmonary effort is normal.     Breath sounds: Normal breath sounds. No wheezing, rhonchi or rales.  Musculoskeletal:     Cervical back: Normal range of motion and neck supple. Tenderness present.  Lymphadenopathy:     Cervical: No cervical adenopathy.  Skin:    General: Skin is warm and dry.     Capillary Refill: Capillary refill takes less than 2 seconds.     Findings: No erythema or rash.  Neurological:     General: No focal deficit present.     Mental Status: She is alert and oriented to person, place,  and time.      UC Treatments / Results  Labs (all labs ordered are listed, but only abnormal results are displayed) Labs Reviewed  RESP PANEL BY RT-PCR (FLU A&B, COVID) [id number]  GROUP A STREP BY PCR    EKG   Radiology No results found.  Procedures Procedures (including critical care time)  Medications Ordered in UC Medications - No data to display  Initial Impression / Assessment and Plan / UC Course  I have reviewed the triage vital signs and the nursing notes.  Pertinent labs & imaging results that were available during my care of the patient were reviewed by me and considered in my medical decision making (see chart for details).   Patient is a pleasant, though ill-appearing, 28 year old female who is currently 14 weeks 6 days pregnant presenting with evaluation of worsening respiratory symptoms.  She has been experiencing sinus pain and pressure for 2 weeks with frequent nosebleeds.  This morning she woke up feeling much worse with continued nasal congestion, fever up to 100, and a significantly sore throat.  Her physical exam does reveal inflammation of her upper respiratory tract as evidenced by inflamed nasal mucosa with bloody discharge in the left nare and thick yellow discharge bilaterally.  She has tenderness to compression of bilateral frontal maxillary sinuses.  She also has erythema to the posterior pharynx but no abnormalities noted to her bilateral tonsillar pillars.  Cardiopulmonary exam physical lung sounds in all fields.  Differential diagnose include sinusitis, COVID, influenza, strep pharyngitis.  I will order a COVID and flu PCR as well as a strep PCR.  Strep PCR is negative.  Respiratory panel is negative for COVID or influenza.  I will discharge patient home with a diagnosis of pansinusitis.  I will start her on cefdinir 300 mg twice daily with food for 7 days.  Sinus irrigation to help alleviate mucus burden along with plain Mucinex.   Final Clinical  Impressions(s) / UC Diagnoses   Final diagnoses:  Acute non-recurrent pansinusitis     Discharge Instructions      The cefdinir twice daily with food for 7 days for treatment of your sinusitis.  Perform sinus irrigation 2-3 times a day with a [employer] sinus rinse kit and distilled water .  Do not use tap water .  You can use plain over-the-counter Mucinex every 6 hours to break up the stickiness of the mucus so your body can clear it.  Increase your oral fluid intake to thin out your mucus so that is also able for your body to clear more easily.  Take an over-the-counter probiotic, such as Culturelle-align-activia, 1 hour after each dose of antibiotic to prevent diarrhea.  To soothe your throat you may gargle with warm salt water  as often as you like.  You may also use over-the-counter Chloraseptic or Sucrets lozenges, no more than 1 lozenge every 2 hours as the menthol may give you diarrhea.  If you develop any new or worsening symptoms return for reevaluation or see your primary care provider.    ED Prescriptions     Medication Sig Dispense Auth. Provider   cefdinir (OMNICEF) 300 MG capsule Take 1 capsule (  300 mg total) by mouth 2 (two) times daily for 7 days. 14 capsule Kent Pear, NP      PDMP not reviewed this encounter.   Kent Pear, NP 11/08/23 [phone]

## 2023-11-08 NOTE — ED Triage Notes (Signed)
 Pt is with her husband  Pt c/o sore throat, temperature of 100, and headache x1day  Pt states that she is [redacted]weeks pregnant  Pt was around her daughter who was traveling to the beach and came home with a cough.

## 2023-11-08 NOTE — Discharge Instructions (Addendum)
 The cefdinir twice daily with food for 7 days for treatment of your sinusitis.  Perform sinus irrigation 2-3 times a day with a [employer]Med sinus rinse kit and distilled water .  Do not use tap water .  You can use plain over-the-counter Mucinex every 6 hours to break up the stickiness of the mucus so your body can clear it.  Increase your oral fluid intake to thin out your mucus so that is also able for your body to clear more easily.  Take an over-the-counter probiotic, such as Culturelle-align-activia, 1 hour after each dose of antibiotic to prevent diarrhea.  To soothe your throat you may gargle with warm salt water  as often as you like.  You may also use over-the-counter Chloraseptic or Sucrets lozenges, no more than 1 lozenge every 2 hours as the menthol may give you diarrhea.  If you develop any new or worsening symptoms return for reevaluation or see your primary care provider.

## 2023-11-12 ENCOUNTER — Inpatient Hospital Stay (HOSPITAL_COMMUNITY)
Admission: AD | Admit: 2023-11-12 | Discharge: 2023-11-12 | Disposition: A | Discharge status: Home / Self Care | Attending: Obstetrics and Gynecology | Admitting: Obstetrics and Gynecology

## 2023-11-12 DIAGNOSIS — Z3A15 15 weeks gestation of pregnancy: Secondary | ICD-10-CM | POA: Insufficient documentation

## 2023-11-12 DIAGNOSIS — O99352 Diseases of the nervous system complicating pregnancy, second trimester: Secondary | ICD-10-CM | POA: Diagnosis not present

## 2023-11-12 DIAGNOSIS — O210 Mild hyperemesis gravidarum: Secondary | ICD-10-CM

## 2023-11-12 DIAGNOSIS — Z79899 Other long term (current) drug therapy: Secondary | ICD-10-CM | POA: Insufficient documentation

## 2023-11-12 DIAGNOSIS — G40109 Localization-related (focal) (partial) symptomatic epilepsy and epileptic syndromes with simple partial seizures, not intractable, without status epilepticus: Secondary | ICD-10-CM | POA: Diagnosis not present

## 2023-11-12 NOTE — MAU Provider Note (Cosign Needed Addendum)
 Chief Complaint: seizure activity   Event Date/Time   First Provider Initiated Contact with Patient 11/12/23 2138      SUBJECTIVE HPI: Pamela Booker is a 28 y.o. [id number] who presents to maternity admissions for seizures. Pt with longstanding hx seizures, she is followed by Dr. Maxie Spaniel at [facility]. After finding out she was pregnant, Dr. Maxie Spaniel increased her Trileptal  to bid dosing (previously was every day dosing). Pt has Vagal Nerve Stimulator in place as well for poorly controlled seizures. She reports last seizure was when she was approx [redacted] weeks pregnant. Her husband, Larinda Plover, witnessed the seizure - states her arms were shaking and she was sitting on the stairs so he lowered her to the floor and placed a pillow behind her head. Seizure around 1956, lasted 30 seconds. Denies head trauma, physical trauma, biting of tongue. She feels her typical post-ictal symptoms of fatigue and mild headache. She has not taken anything for headache.   Denies VB, LOF. Has occ ?BH type pain vs round ligament pain.  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     EAB 11/19.   Started back on continuous nuvaring thereafter  1/20- excessive bleeding. U/s with 3cm EMS. Placed on provera 10mg  bid in addition to nuvaring.   Encourage progesterone  taper. Have full menses at end of 3wks of Nuvaring.   2/20- u/s with resolved thickening of EMS to 4mm  Restart continuous nuvaring   EAB 11/19.   Sta   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/04/2022   Formatting of this note might be different from the original. Pap 3/22- LGSIL. 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     Longstanding. Reports occasional tearing and discomfort with sex.   She would like hormones checked.  She states that her estrogen level was low several years ago after a multiyear course of Depo-Provera.  Labs with low estradiol , FSH,  and LH. Normal prolactin & TSH.[name]  Discussed increasing her estrogen level by changing to a higher    Past Surgical History:  Procedure Laterality Date   CYSTOSCOPY W/ URETERAL STENT REMOVAL Left 01/09/2023   Procedure: CYSTOSCOPY WITH STENT REMOVAL;  Surgeon: Geraline Knapp, MD;  Location: [facility];  Service: Urology;  Laterality: Left;   CYSTOSCOPY/URETEROSCOPY/HOLMIUM LASER/STENT PLACEMENT Left 01/02/2023   Procedure: CYSTOSCOPY/URETEROSCOPY/HOLMIUM LASER/STENT PLACEMENT;  Surgeon: Geraline Knapp, MD;  Location: [facility];  Service: Urology;  Laterality: Left;    CYSTOSCOPY/URETEROSCOPY/HOLMIUM LASER/STENT PLACEMENT Right 01/09/2023   Procedure: CYSTOSCOPY/URETEROSCOPY/HOLMIUM LASER/STENT PLACEMENT;  Surgeon: Geraline Knapp, MD;  Location: [facility];  Service: Urology;  Laterality: Right;   DILATION AND EVACUATION N/A 05/17/2023   Procedure: DILATATION AND EVACUATION;  Surgeon: Raynell Caller, MD;  Location: [facility];  Service: Gynecology;  Laterality: N/A;   ENDOMETRIAL ABLATION  06/2022   [facility]   LAPAROSCOPY     x5 for endometriosis   LOOP RECORDER INSERTION  2020   LOOP RECORDER REMOVAL  2021   TONSILLECTOMY     URETEROSCOPY Left 01/09/2023   Procedure: URETEROSCOPY;  Surgeon: Geraline Knapp, MD;  Location: [facility];  Service: Urology;  Laterality: Left;   VAGUS NERVE STIMULATOR INSERTION  2022   Social History   Socioeconomic History   Marital status: Married    Spouse name: Not on file   Number of children: 1   Years of education: Not on file   Highest education level: Some college, no degree  Occupational History   Not on file  Tobacco Use   Smoking status: Never   Smokeless tobacco: Never  Vaping Use   Vaping status: Former   Substances: Nicotine  Substance and Sexual Activity   Alcohol use: Not Currently    Comment: socially   Drug use: Not Currently    Comment: THC   Sexual activity: Not Currently    Birth control/protection: None  Other Topics Concern   Not on file  Social History Narrative   Pt lives alone with daughter in 2 story home   Right handed   Pt drinks coffee everyday, sometime tea at night, soda-coke zero not often   exercise 3-4 times a week          Social Drivers of Corporate investment banker Strain: Not on file  Food Insecurity: No Food Insecurity (05/09/2023)   Hunger Vital Sign    Worried About Running Out of Food in the Last Year: Never true    Ran Out of Food in the Last Year: Never true  Transportation Needs: No Transportation Needs (05/09/2023)   PRAPARE - Scientist, research (physical sciences) (Medical): No    Lack of Transportation (Non-Medical): No  Physical Activity: Not on file  Stress: Not on file  Social Connections: Not on file  Intimate Partner Violence: Not on file   No current facility-administered medications on file prior to encounter.   Current Outpatient Medications on File Prior to Encounter  Medication Sig Dispense Refill   OXcarbazepine  (TRILEPTAL ) 150 MG tablet Take 4 tablets (600 mg total) by mouth 2 (two) times daily.     cefdinir  (OMNICEF ) 300 MG capsule Take 1 capsule (300 mg total) by mouth 2 (two) times daily for 7 days. 14 capsule 0   diazepam  (VALIUM ) 10 MG tablet Take 1 tablet (10 mg total) by mouth every 6 (six) hours as needed (Seizure/aura). 30 tablet 0   escitalopram  (LEXAPRO ) 20 MG tablet  Take 20 mg by mouth daily.     folic acid (FOLVITE) 1 MG tablet Take 4 mg by mouth daily.     Heating Pads PADS 1 Pad by Does not apply route as needed (cramping).     ondansetron  (ZOFRAN -ODT) 4 MG disintegrating tablet Take 1 tablet (4 mg total) by mouth every 6 (six) hours as needed for nausea. 30 tablet 1   Prenatal Vit-Fe Fumarate-FA (MULTIVITAMIN-PRENATAL) 27-0.8 MG TABS tablet Take 2 tablets by mouth daily at 12 noon.     valACYclovir  (VALTREX ) 500 MG tablet Take 500 mg by mouth daily as needed (Cold sores).     Allergies  Allergen Reactions   Zolpidem Other (See Comments)    seizure  Other Reaction(s): Other (See Comments)    seizure   Diphenhydramine  Other (See Comments)    "Crawling feeling under skin"  "Crawling feeling under skin"    "It feels as if I have things crawling on my skin."  "It feels as if I have things crawling on my skin."     "Crawling feeling under skin"    "It feels as if I have things crawling on my skin."   "Crawling feeling under skin"    "Crawling feeling under skin"  "It feels as if I have things crawling on my skin."   Ibuprofen Other (See Comments)    Other reaction(s): GI Upset  (intolerance)  Other reaction(s): GI Upset (intolerance) Other reaction(s): GI Upset (intolerance)    Other reaction(s): GI Upset (intolerance)    "I have stomach ulcers and I get really bad cramping.  Other reaction(s): GI Upset (intolerance)    "I have stomach ulcers and I get really bad cramping.    Other reaction(s): GI Upset (intolerance) Other reaction(s): GI Upset (intolerance)    Other reaction(s): GI Upset (intolerance) Other reaction(s): GI Upset (intolerance)  Other reaction(s): GI Upset (intolerance)  "I have stomach ulcers and I get really bad cramping.   Nsaids Other (See Comments)    History of bleeding ulcers   Compazine  [Prochlorperazine ]    Keppra  [Levetiracetam ] Other (See Comments)    "Makes her feel hungover"    Phenergan  [Promethazine ]    Emgality [Galcanezumab-Gnlm] Rash   Lorazepam  Itching    ROS:  Review of Systems  I have reviewed patient's Past Medical Hx, Surgical Hx, Family Hx, Social Hx, medications and allergies.   Physical Exam  Patient Vitals for the past 24 hrs:  BP Temp Temp src Pulse Resp Height Weight  11/12/23 2130 (!) 110/53 98.5 F (36.9 C) Oral 71 14 5\' 2"  (1.575 m) 72.3 kg   Physical Exam Constitutional:      General: She is not in acute distress.    Appearance: Normal appearance. She is not ill-appearing.  HENT:     Head: Normocephalic and atraumatic.  Cardiovascular:     Rate and Rhythm: Normal rate.  Pulmonary:     Effort: Pulmonary effort is normal.     Breath sounds: Normal breath sounds.  Musculoskeletal:        General: Normal range of motion.  Skin:    General: Skin is warm and dry.     Findings: No rash.  Neurological:     General: No focal deficit present.     Mental Status: She is alert and oriented to person, place, and time.   FHR 150s  MDM Patient is a 28 y.o. [id number] at [redacted]w[redacted]d who presents with c/o seizure at 34. On exam, she is tired appearing, but  otherwise no concerning findings on physical exam.  FHR wnl, BP normotensive. Discussed recommendation to seek further evaluation in the Main ED for further management of uncontrolled seizure disorder. Patient reports she has a longstanding seizure history, has been to the ER several times for the same symptoms and does not desire to return. I emphasized again that my recommendation would be to seek further eval for symptoms in Main ER. Discussed that her going home would be against my medical advice. She plans to return home. I encouraged she at least reach out to her neurologist and return to ER if symptoms change or seizure recurs.   ASSESSMENT MSE Complete  PLAN Discharge patient at her request to return home  Melanie Spires, MD 11/12/2023 9:50 PM

## 2023-11-12 NOTE — MAU Note (Signed)
 Pt says she had a seizure -  Per Husband - Pamela Booker - says at 756pm- her arns were shaking- sitting on stairs - he moved her to the floor - with pillow behind her head. He says about 30 sec later - she was ok.  Last time had a seizure- 8 weeks preg.  Took meds this morning meds for seizure- but not tonight's yet . In Triage- pt answering questions - says feels tired and drowsy.

## 2023-11-15 ENCOUNTER — Encounter: Payer: Self-pay | Admitting: Obstetrics & Gynecology

## 2023-11-16 ENCOUNTER — Ambulatory Visit: Admission: EM | Admit: 2023-11-16 | Discharge: 2023-11-16 | Disposition: A | Discharge status: Home / Self Care

## 2023-11-16 DIAGNOSIS — B349 Viral infection, unspecified: Secondary | ICD-10-CM | POA: Diagnosis not present

## 2023-11-16 DIAGNOSIS — Z3A16 16 weeks gestation of pregnancy: Secondary | ICD-10-CM | POA: Diagnosis not present

## 2023-11-16 NOTE — ED Provider Notes (Signed)
 [facility]    CSN: [id number] Arrival date & time: 11/16/23  1735      History   Chief Complaint Chief Complaint  Patient presents with   Generalized Body Aches   Headache   Nasal Congestion    HPI Pamela Booker is a 28 y.o. female.   28 year old female, Pamela Booker, presents to urgent care for evaluation of continued headache, neck pain and fatigue since previous visit on 11/08/2023.  She states she is still taking cefdinir  has 1 more days worth left.  Patient reports history of seizures, last one was last week, patient receives biweekly banana bag infusions during pregnancy.  She denies recent head injury or insect bite.  She states she is on her third standing cup of water  today, takes daily prenatal vitamin  Patient is [redacted] weeks pregnant  PMH: Hyperemesis gravidarum 11/12/2023  The history is provided by the patient. No language interpreter was used.    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     EAB 11/19.   Started back on continuous nuvaring thereafter  1/20- excessive bleeding. U/s with 3cm EMS. Placed on provera 10mg  bid in addition to nuvaring.  Encourage progesterone  taper. Have full menses at end of 3wks of Nuvaring.   2/20- u/s with resolved thickening of EMS to 4mm   Restart continuous nuvaring   EAB 11/19.   Sta   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/04/2022   Formatting of this note might be different from the original. Pap 3/22- LGSIL. 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     Longstanding. Reports occasional tearing and discomfort with sex.   She would like hormones checked.  She states that her estrogen level was low several years ago after a multiyear course of Depo-Provera.  Labs with low estradiol , FSH, and LH. Normal prolactin & TSH.[name]  Discussed increasing her estrogen level by  changing to a higher     Patient Active Problem List   Diagnosis Date Noted   Nonspecific syndrome suggestive of viral illness 11/16/2023   [redacted] weeks gestation of pregnancy 11/16/2023   Hyperemesis gravidarum 10/11/2023   Long-term current use of benzodiazepine 10/08/2023   Supervision of high-risk pregnancy 04/27/2023   Epilepsy (HCC) 07/05/2021   Migraine 07/05/2021   Hx of preeclampsia, prior pregnancy, currently pregnant 07/05/2021   S/P placement of VNS (vagus nerve stimulation) device 05/06/2019   PVC (premature ventricular contraction) 03/11/2018    Past Surgical History:  Procedure Laterality Date    CYSTOSCOPY W/ URETERAL STENT REMOVAL Left 01/09/2023   Procedure: CYSTOSCOPY WITH STENT REMOVAL;  Surgeon: Geraline Knapp, MD;  Location: [facility];  Service: Urology;  Laterality: Left;   CYSTOSCOPY/URETEROSCOPY/HOLMIUM LASER/STENT PLACEMENT Left 01/02/2023   Procedure: CYSTOSCOPY/URETEROSCOPY/HOLMIUM LASER/STENT PLACEMENT;  Surgeon: Geraline Knapp, MD;  Location: [facility];  Service: Urology;  Laterality: Left;   CYSTOSCOPY/URETEROSCOPY/HOLMIUM LASER/STENT PLACEMENT Right 01/09/2023   Procedure: CYSTOSCOPY/URETEROSCOPY/HOLMIUM LASER/STENT PLACEMENT;  Surgeon: Geraline Knapp, MD;  Location: [facility];  Service: Urology;  Laterality: Right;   DILATION AND EVACUATION N/A 05/17/2023   Procedure: DILATATION AND EVACUATION;  Surgeon: Raynell Caller, MD;  Location: [facility];  Service: Gynecology;  Laterality: N/A;   ENDOMETRIAL ABLATION  06/2022   [facility]   LAPAROSCOPY     x5 for endometriosis   LOOP RECORDER INSERTION  2020   LOOP RECORDER REMOVAL  2021   TONSILLECTOMY     URETEROSCOPY Left 01/09/2023   Procedure: URETEROSCOPY;  Surgeon: Geraline Knapp, MD;  Location: [facility];  Service: Urology;  Laterality: Left;   VAGUS NERVE STIMULATOR INSERTION  2022    OB History     Gravida  5   Para  1   Term  1   Preterm  0   AB  3   Living  1      SAB  3   IAB  0   Ectopic  0   Multiple  0   Live Births  1            Home Medications    Prior to Admission medications   Medication Sig Start Date End Date Taking? Authorizing Provider  diazepam  (VALIUM ) 10 MG tablet Take 1 tablet (10 mg total) by mouth every 6 (six) hours as needed (Seizure/aura). 05/09/23   Abner Ables, MD  escitalopram  (LEXAPRO ) 20 MG tablet Take 20 mg by mouth daily.    [provider]  folic acid (FOLVITE) 1 MG tablet Take 4 mg by mouth daily.    [provider]  Heating Pads PADS 1 Pad by Does not apply route as needed (cramping). 05/17/23   Raynell Caller, MD  ondansetron   (ZOFRAN -ODT) 4 MG disintegrating tablet Take 1 tablet (4 mg total) by mouth every 6 (six) hours as needed for nausea. 10/09/23   Wouk, Haynes Lips, MD  OXcarbazepine  (TRILEPTAL ) 150 MG tablet Take 4 tablets (600 mg total) by mouth 2 (two) times daily. 09/14/23   Derick Fleeting, CNM  Prenatal Vit-Fe Fumarate-FA (MULTIVITAMIN-PRENATAL) 27-0.8 MG TABS tablet Take 2 tablets by mouth daily at 12 noon.    [provider]  valACYclovir  (VALTREX ) 500 MG tablet Take 500 mg by mouth daily as needed (Cold sores).    [provider]    Family History Family History  Adopted: Yes  Problem Relation Age of Onset   Healthy Daughter     Social  History Social History   Tobacco Use   Smoking status: Never   Smokeless tobacco: Never  Vaping Use   Vaping status: Former   Substances: Nicotine  Substance Use Topics   Alcohol use: Not Currently    Comment: socially   Drug use: Not Currently    Comment: THC     Allergies   Zolpidem, Diphenhydramine , Ibuprofen, Nsaids, Compazine  [prochlorperazine ], Keppra  [levetiracetam ], Phenergan  [promethazine ], Emgality [galcanezumab-gnlm], and Lorazepam    Review of Systems Review of Systems  Constitutional:  Negative for fever.  Gastrointestinal:  Negative for abdominal pain.  Genitourinary:  Negative for dysuria.  Musculoskeletal:  Positive for myalgias and neck pain. Negative for back pain and neck stiffness.  Neurological:  Positive for headaches.  All other systems reviewed and are negative.    Physical Exam Triage Vital Signs ED Triage Vitals  Encounter Vitals Group     BP 11/16/23 1748 126/81     Girls Systolic BP Percentile --      Girls Diastolic BP Percentile --      Boys Systolic BP Percentile --      Boys Diastolic BP Percentile --      Pulse Rate 11/16/23 1748 81     Resp --      Temp 11/16/23 1748 98.7 F (37.1 C)     Temp Source 11/16/23 1748 Oral     SpO2 11/16/23 1748 100 %     Weight 11/16/23 1749 155 lb (70.3  kg)     Height 11/16/23 1749 5' 2 (1.575 m)     Head Circumference --      Peak Flow --      Pain Score 11/16/23 1749 6     Pain Loc --      Pain Education --      Exclude from Growth Chart --    No data found.  Updated Vital Signs BP 126/81 (BP Location: Left Arm)   Pulse 81   Temp 98.7 F (37.1 C) (Oral)   Ht 5' 2 (1.575 m)   Wt 155 lb (70.3 kg)   LMP 07/27/2023   SpO2 100%   BMI 28.35 kg/m   Visual Acuity Right Eye Distance:   Left Eye Distance:   Bilateral Distance:    Right Eye Near:   Left Eye Near:    Bilateral Near:     Physical Exam Vitals and nursing note reviewed.  Constitutional:      General: She is not in acute distress.    Appearance: She is well-developed.  HENT:     Head: Normocephalic.     Right Ear: Tympanic membrane is retracted.     Left Ear: Tympanic membrane is retracted.     Nose: Congestion present.     Mouth/Throat:     Lips: Pink.     Mouth: Mucous membranes are moist.     Pharynx: Oropharynx is clear. Uvula midline.   Eyes:     General: Lids are normal.     Conjunctiva/sclera: Conjunctivae normal.     Pupils: Pupils are equal, round, and reactive to light.   Neck:     Trachea: No tracheal deviation.   Cardiovascular:     Rate and Rhythm: Normal rate and regular rhythm.     Heart sounds: Normal heart sounds. No murmur heard. Pulmonary:     Effort: Pulmonary effort is normal.     Breath sounds: Normal breath sounds and air entry.  Abdominal:     General: Bowel sounds are normal.  Palpations: Abdomen is soft.     Tenderness: There is no abdominal tenderness.   Musculoskeletal:        General: Normal range of motion.     Cervical back: Normal range of motion.  Lymphadenopathy:     Cervical: No cervical adenopathy.   Skin:    General: Skin is warm and dry.     Findings: No rash.   Neurological:     General: No focal deficit present.     Mental Status: She is alert and oriented to person, place, and time.      GCS: GCS eye subscore is 4. GCS verbal subscore is 5. GCS motor subscore is 6.   Psychiatric:        Speech: Speech normal.        Behavior: Behavior normal. Behavior is cooperative.      UC Treatments / Results  Labs (all labs ordered are listed, but only abnormal results are displayed) Labs Reviewed - No data to display  EKG   Radiology No results found.  Procedures Procedures (including critical care time)  Medications Ordered in UC Medications - No data to display  Initial Impression / Assessment and Plan / UC Course  I have reviewed the triage vital signs and the nursing notes.  Pertinent labs & imaging results that were available during my care of the patient were reviewed by me and considered in my medical decision making (see chart for details).    Discussed exam findings and plan of care with patient, vital signs are stable and reassuring, patient is afebrile.  Recommend patient finish her prescription of cefdinir  and if she has worsening symptoms go to MAU in [place] as she has high risk pregnancy.  Patient verbalized understanding to this provider.  Ddx: Nonspecific syndrome suggestive of viral illness, pregnancy, anxiety , dehydration Final Clinical Impressions(s) / UC Diagnoses   Final diagnoses:  Nonspecific syndrome suggestive of viral illness  [redacted] weeks gestation of pregnancy     Discharge Instructions      Finish antibiotic Push fluids If you have new or worsening issues or concerns go to the MAU for further evaluation due to high risk pregnancy     ED Prescriptions   None    PDMP not reviewed this encounter.   Peter Brands, NP 11/16/23 2039

## 2023-11-16 NOTE — ED Triage Notes (Signed)
 Pt c/o continued headache, neck pain, and fatigue since last visit  Pt states that she finishes the antibiotics from her last visit tomorrow.

## 2023-11-16 NOTE — Discharge Instructions (Signed)
 Finish antibiotic Push fluids If you have new or worsening issues or concerns go to the MAU for further evaluation due to high risk pregnancy

## 2023-11-20 ENCOUNTER — Ambulatory Visit

## 2023-11-20 VITALS — BP 113/74 | HR 66 | Temp 97.9°F | Resp 18 | Ht 62.0 in | Wt 159.6 lb

## 2023-11-20 DIAGNOSIS — O210 Mild hyperemesis gravidarum: Secondary | ICD-10-CM | POA: Diagnosis not present

## 2023-11-20 DIAGNOSIS — Z3A16 16 weeks gestation of pregnancy: Secondary | ICD-10-CM

## 2023-11-20 MED ORDER — THIAMINE HCL 100 MG/ML IJ SOLN
Freq: Once | INTRAVENOUS | Status: AC
  Filled 2023-11-20: qty 1000

## 2023-11-20 NOTE — Progress Notes (Signed)
 Diagnosis:  Hyperemesis gravidarum    Provider:  Praveen Mannam MD  Procedure: IV Infusion  IV Type: Peripheral, IV Location: R Forearm  Banana Bag, Dose: 1000 ml  Infusion Start Time: 1430  Infusion Stop Time: 1650  Post Infusion IV Care: Peripheral IV Discontinued  Discharge: Condition: Good, Destination: Home . AVS Declined  Performed by:  Ilan Kahrs, RN

## 2023-11-29 NOTE — Anesthesia Postprocedure Evaluation (Signed)
 Interventional Radiology Brief Postprocedure Note  Date: November 29, 2023 3:10 PM  Procedure performed: US  FNA Thyroid   Pre procedure diagnosis: left thyroid  nodule  Primary Proceduralist: Alm Lovett, MD  Assistant(s): Elberta Shad, MD  Relevant Labs:  Lab Results  Component Value Date   CREATININE 0.72 10/16/2022    Post procedure diagnosis: same  Anesthesia (type): Local  Implants: Nothing was implanted during the procedure  Specimen:  Specimens     ID Source Type Tests Collected By Collected At Frozen?   1 Thyroid , Left Fine Needle Aspirate FINE NEEDLE ASPIRATE  Elberta Lenord Shad, MD 11/29/23  2:42 PM        Disposition of specimens: Yes - Sent to pathology  Blood administered: See Blood Administration Record.  Estimated blood loss: None  Increased risk of post procedure bleeding: No  Complications: None noted  Findings and Description: Technically successful US  guided FNA of the hypoechoic left thyroid  lobe nodule. Seven passes were performed demonstrating most bloody samples on preliminary examination, with only 1 group noted. After 7 similarly bloody samples the procedure was ended.  See detailed result report with images in PACS.  The patient tolerated the procedure well without incident or complication and is in stable condition.  [name]

## 2023-11-30 ENCOUNTER — Encounter: Payer: Self-pay | Admitting: Obstetrics & Gynecology

## 2023-12-04 ENCOUNTER — Ambulatory Visit: Admitting: Obstetrics & Gynecology

## 2023-12-04 ENCOUNTER — Other Ambulatory Visit: Payer: Self-pay

## 2023-12-04 VITALS — BP 119/78 | HR 73 | Wt 164.5 lb

## 2023-12-04 DIAGNOSIS — O0992 Supervision of high risk pregnancy, unspecified, second trimester: Secondary | ICD-10-CM

## 2023-12-04 DIAGNOSIS — E041 Nontoxic single thyroid nodule: Secondary | ICD-10-CM

## 2023-12-04 DIAGNOSIS — O99282 Endocrine, nutritional and metabolic diseases complicating pregnancy, second trimester: Secondary | ICD-10-CM | POA: Diagnosis not present

## 2023-12-04 DIAGNOSIS — Z3A18 18 weeks gestation of pregnancy: Secondary | ICD-10-CM | POA: Diagnosis not present

## 2023-12-04 DIAGNOSIS — Z79899 Other long term (current) drug therapy: Secondary | ICD-10-CM

## 2023-12-04 NOTE — Progress Notes (Unsigned)
   PRENATAL VISIT NOTE  Subjective:  Pamela Booker is a 28 y.o. [phone] at [redacted]w[redacted]d being seen today for ongoing prenatal care.  She is currently monitored for the following issues for this high-risk pregnancy and has PVC (premature ventricular contraction); S/P placement of VNS (vagus nerve stimulation) device; Epilepsy (HCC); Migraine; Hx of preeclampsia, prior pregnancy, currently pregnant; Supervision of high-risk pregnancy; Long-term current use of benzodiazepine; Hyperemesis gravidarum; Nonspecific syndrome suggestive of viral illness; and Thyroid  nodule on their problem list.  Patient reports passed a kidney stone.  Contractions: Irritability (after sex). Vag. Bleeding: None.  Movement: Present. Denies leaking of fluid.   The following portions of the patient's history were reviewed and updated as appropriate: allergies, current medications, past family history, past medical history, past social history, past surgical history and problem list.   Objective:    Vitals:   12/04/23 1619  BP: 119/78  Pulse: 73  Weight: 164 lb 8 oz (74.6 kg)    Fetal Status:  Fetal Heart Rate (bpm): 147   Movement: Present    General: Alert, oriented and cooperative. Patient is in no acute distress.  Skin: Skin is warm and dry. No rash noted.   Cardiovascular: Normal heart rate noted  Respiratory: Normal respiratory effort, no problems with respiration noted  Abdomen: Soft, gravid, appropriate for gestational age.  Pain/Pressure: Absent     Pelvic: Cervical exam deferred        Extremities: Normal range of motion.  Edema: None  Mental Status: Normal mood and affect. Normal behavior. Normal judgment and thought content.   Assessment and Plan:  Pregnancy: G5P1031 at [redacted]w[redacted]d 1. Supervision of high risk pregnancy in second trimester (Primary)  - AFP, Serum, Open Spina Bifida  2. Long-term current use of benzodiazepine   3. [redacted] weeks gestation of pregnancy  - AFP, Serum, Open Spina  Bifida  4. Thyroid  nodule Will have removal at [facility]  Preterm labor symptoms and general obstetric precautions including but not limited to vaginal bleeding, contractions, leaking of fluid and fetal movement were reviewed in detail with the patient. Please refer to After Visit Summary for other counseling recommendations.   Return in about 4 weeks (around 01/01/2024).  Future Appointments  Date Time Provider Department Center  12/05/2023  2:00 PM [facility]CHAIR 2 [facility] None  12/14/2023  7:00 AM [facility] PROVIDER 1 [facility]  12/14/2023  7:30 AM [facility] US6 [facility]  12/19/2023  2:00 PM [facility]-CHAIR 2 [facility] None  12/21/2023  4:00 PM Tobb, Kardie, DO [facility] None  01/02/2024  4:15 PM Eldonna, Suzen Octave, MD [facility]    Lynwood Solomons, MD

## 2023-12-05 ENCOUNTER — Ambulatory Visit

## 2023-12-05 VITALS — BP 104/65 | HR 72 | Temp 98.9°F | Resp 16 | Ht 62.0 in | Wt 165.4 lb

## 2023-12-05 DIAGNOSIS — Z3A18 18 weeks gestation of pregnancy: Secondary | ICD-10-CM

## 2023-12-05 DIAGNOSIS — O210 Mild hyperemesis gravidarum: Secondary | ICD-10-CM | POA: Diagnosis not present

## 2023-12-05 MED ORDER — THIAMINE HCL 100 MG/ML IJ SOLN
Freq: Once | INTRAVENOUS | Status: AC
  Filled 2023-12-05: qty 1000

## 2023-12-05 NOTE — Progress Notes (Signed)
 Diagnosis: Hyperemesis gravidarum  Provider:  Praveen Mannam MD  Procedure: IV Infusion  IV Type: Peripheral, IV Location: L Hand  Banana Bag, Dose: 1000 ml  Infusion Start Time: 1436  Infusion Stop Time: 1700  Post Infusion IV Care: Peripheral IV Discontinued  Discharge: Condition: Good, Destination: Home . AVS Declined  Performed by:  Eleanor DELENA Bloch, RN

## 2023-12-06 LAB — AFP, SERUM, OPEN SPINA BIFIDA
AFP MoM: 0.71
AFP Value: 31.3 ng/mL
Gest. Age on Collection Date: 18.6 wk
Maternal Age At EDD: 30.5 a
OSBR Risk 1 IN: 10000
Test Results:: NEGATIVE
Weight: 164 [lb_av]

## 2023-12-09 ENCOUNTER — Ambulatory Visit: Payer: Self-pay | Admitting: Obstetrics & Gynecology

## 2023-12-10 ENCOUNTER — Inpatient Hospital Stay (HOSPITAL_COMMUNITY)
Admission: AD | Admit: 2023-12-10 | Discharge: 2023-12-10 | Disposition: A | Discharge status: Home / Self Care | Attending: Obstetrics and Gynecology | Admitting: Obstetrics and Gynecology

## 2023-12-10 DIAGNOSIS — O9A212 Injury, poisoning and certain other consequences of external causes complicating pregnancy, second trimester: Secondary | ICD-10-CM | POA: Diagnosis present

## 2023-12-10 DIAGNOSIS — O368120 Decreased fetal movements, second trimester, not applicable or unspecified: Secondary | ICD-10-CM | POA: Diagnosis not present

## 2023-12-10 DIAGNOSIS — Z3A19 19 weeks gestation of pregnancy: Secondary | ICD-10-CM

## 2023-12-10 DIAGNOSIS — W19XXXA Unspecified fall, initial encounter: Secondary | ICD-10-CM

## 2023-12-10 DIAGNOSIS — W1830XA Fall on same level, unspecified, initial encounter: Secondary | ICD-10-CM | POA: Insufficient documentation

## 2023-12-10 NOTE — MAU Provider Note (Signed)
 Chief Complaint: Fall  SUBJECTIVE HPI: Pamela Booker is a 28 y.o. [phone] at [redacted]w[redacted]d by LMP who presents to maternity admissions reporting a fall from standing onto her right side and hip on Friday with associated DFM since. Denies abdominal pain, cramping or increased pelvic pressure. Just worried about baby.  She denies vaginal bleeding, vaginal itching/burning, urinary symptoms, h/a, dizziness, n/v, or fever/chills.    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     EAB 11/19.   Started back on continuous nuvaring thereafter  1/20- excessive bleeding. U/s with 3cm EMS. Placed on provera 10mg  bid in addition to nuvaring.  Encourage progesterone  taper. Have full menses at end of 3wks of Nuvaring.   2/20- u/s with resolved thickening of EMS to 4mm  Restart continuous nuvaring   EAB 11/19.   Sta   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/04/2022   Formatting of this note might be different from the original. Pap 3/22- LGSIL. 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     Longstanding. Reports occasional tearing and discomfort with sex.   She would like hormones checked.  She states that her estrogen level was low several years ago after a multiyear course of Depo-Provera.  Labs with low estradiol , FSH, and LH. Normal prolactin & TSH.[name]  Discussed increasing her estrogen level by changing to a higher    Past Surgical History:  Procedure Laterality Date   CYSTOSCOPY W/ URETERAL STENT REMOVAL Left 01/09/2023   Procedure: CYSTOSCOPY WITH STENT REMOVAL;  Surgeon: Twylla Glendia BROCKS, MD;  Location: [facility] ORS;  Service: Urology;  Laterality: Left;   CYSTOSCOPY/URETEROSCOPY/HOLMIUM LASER/STENT PLACEMENT Left 01/02/2023   Procedure: CYSTOSCOPY/URETEROSCOPY/HOLMIUM LASER/STENT PLACEMENT;  Surgeon: Twylla Glendia BROCKS, MD;  Location: [facility] OR[facility];  Service: Urology;  Laterality: Left;   CYSTOSCOPY/URETEROSCOPY/HOLMIUM LASER/STENT PLACEMENT Right 01/09/2023   Procedure: CYSTOSCOPY/URETEROSCOPY/HOLMIUM LASER/STENT PLACEMENT;  Surgeon: Twylla Glendia BROCKS, MD;  Location:  [facility] ORS;  Service: Urology;  Laterality: Right;   DILATION AND EVACUATION N/A 05/17/2023   Procedure: DILATATION AND EVACUATION;  Surgeon: Izell Harari, MD;  Location: [facility];  Service: Gynecology;  Laterality: N/A;   ENDOMETRIAL ABLATION  06/2022   [facility]   LAPAROSCOPY     x5 for endometriosis   LOOP RECORDER INSERTION  2020   LOOP RECORDER REMOVAL  2021   TONSILLECTOMY     URETEROSCOPY Left 01/09/2023   Procedure:  URETEROSCOPY;  Surgeon: Twylla Glendia BROCKS, MD;  Location: [facility] OR[facility];  Service: Urology;  Laterality: Left;   VAGUS NERVE STIMULATOR INSERTION  2022   Social History   Socioeconomic History   Marital status: Married    Spouse name: Not on file   Number of children: 1   Years of education: Not on file   Highest education level: Some college, no degree  Occupational History   Not on file  Tobacco Use   Smoking status: Never   Smokeless tobacco: Never  Vaping Use   Vaping status: Former   Substances: Nicotine  Substance and Sexual Activity   Alcohol use: Not Currently    Comment: socially   Drug use: Not Currently    Comment: THC   Sexual activity: Not Currently    Birth control/protection: None  Other Topics Concern   Not on file  Social History Narrative   Pt lives alone with daughter in 2 story home   Right handed   Pt drinks coffee everyday, sometime tea at night, soda-coke zero not often   exercise 3-4 times a week          Social Drivers of Corporate investment banker Strain: Not on file  Food Insecurity: No Food Insecurity (05/09/2023)   Hunger Vital Sign    Worried About Running Out of Food in the Last Year: Never true    Ran Out of Food in the Last Year: Never true  Transportation Needs: No Transportation Needs (05/09/2023)   PRAPARE - Administrator, Civil Service (Medical): No    Lack of Transportation (Non-Medical): No  Physical Activity: Not on file  Stress: Not on file  Social Connections: Not on file  Intimate Partner Violence: Not on file   No current facility-administered medications on file prior to encounter.   Current Outpatient Medications on File Prior to Encounter  Medication Sig Dispense Refill   diazepam  (VALIUM ) 10 MG tablet Take 1 tablet (10 mg total) by mouth every 6 (six) hours as needed (Seizure/aura). 30 tablet 0   escitalopram  (LEXAPRO ) 20 MG tablet Take 20 mg by mouth daily.     folic acid (FOLVITE) 1 MG tablet Take 4 mg by  mouth daily.     Heating Pads PADS 1 Pad by Does not apply route as needed (cramping).     ondansetron  (ZOFRAN -ODT) 4 MG disintegrating tablet Take 1 tablet (4 mg total) by mouth every 6 (six) hours as needed for nausea. 30 tablet 1   OXcarbazepine  (TRILEPTAL ) 150 MG tablet Take 4 tablets (600 mg total) by mouth 2 (two) times daily.     Prenatal Vit-Fe Fumarate-FA (MULTIVITAMIN-PRENATAL) 27-0.8 MG TABS tablet Take 2 tablets by mouth daily at 12 noon.     valACYclovir  (VALTREX ) 500 MG tablet Take 500 mg by mouth daily as needed (Cold sores).     Allergies  Allergen Reactions   Zolpidem Other (See Comments)    seizure  Other Reaction(s): Other (See Comments)    seizure  Diphenhydramine  Other (See Comments)    Crawling feeling under skin  Crawling feeling under skin    It feels as if I have things crawling on my skin.  It feels as if I have things crawling on my skin.     Crawling feeling under skin    It feels as if I have things crawling on my skin.   Crawling feeling under skin    Crawling feeling under skin  It feels as if I have things crawling on my skin.   Ibuprofen Other (See Comments)    Other reaction(s): GI Upset (intolerance)  Other reaction(s): GI Upset (intolerance) Other reaction(s): GI Upset (intolerance)    Other reaction(s): GI Upset (intolerance)    I have stomach ulcers and I get really bad cramping.  Other reaction(s): GI Upset (intolerance)    I have stomach ulcers and I get really bad cramping.    Other reaction(s): GI Upset (intolerance) Other reaction(s): GI Upset (intolerance)    Other reaction(s): GI Upset (intolerance) Other reaction(s): GI Upset (intolerance)  Other reaction(s): GI Upset (intolerance)  I have stomach ulcers and I get really bad cramping.   Nsaids Other (See Comments)    History of bleeding ulcers   Compazine  [Prochlorperazine ]    Keppra  [Levetiracetam ] Other (See Comments)    Makes her feel hungover     Phenergan  [Promethazine ]    Emgality [Galcanezumab-Gnlm] Rash   Lorazepam  Itching    ROS:  Pertinent positives/negatives listed above.  I have reviewed patient's Past Medical Hx, Surgical Hx, Family Hx, Social Hx, medications and allergies.   Physical Exam  Patient Vitals for the past 24 hrs:  BP Temp Pulse Resp  12/10/23 1438 123/73 98.5 F (36.9 C) 83 18   Constitutional: Well-developed, well-nourished female in no acute distress.  Cardiovascular: normal rate Respiratory: normal effort GI: Abd soft, non-tender. Pos BS x 4 MS: Extremities nontender, no edema, normal ROM Neurologic: Alert and oriented x 4.  GU: Neg CVAT.  Doppler: 145  LAB RESULTS No results found for this or any previous visit (from the past 24 hours).  O/Positive/-- (05/05 1656)  IMAGING No results found.  MAU Management/MDM: Orders Placed This Encounter  Procedures   Discharge patient Discharge disposition: 01-Home or Self Care; Discharge patient date: 12/10/2023    No orders of the defined types were placed in this encounter.   ASSESSMENT 1. [redacted] weeks gestation of pregnancy   2. Fall from standing, initial encounter   Active fetus on bedside US , subjectively normal FHR and fluid level.   PLAN Discharge home with strict return precautions. Allergies as of 12/10/2023       Reactions   Zolpidem Other (See Comments)   seizure Other Reaction(s): Other (See Comments)    seizure   Diphenhydramine  Other (See Comments)   Crawling feeling under skin Crawling feeling under skin    It feels as if I have things crawling on my skin. It feels as if I have things crawling on my skin.     Crawling feeling under skin    It feels as if I have things crawling on my skin.   Crawling feeling under skin    Crawling feeling under skin  It feels as if I have things crawling on my skin.   Ibuprofen Other (See Comments)   Other reaction(s): GI Upset (intolerance) Other reaction(s): GI  Upset (intolerance) Other reaction(s): GI Upset (intolerance)    Other reaction(s): GI Upset (intolerance)    I have stomach  ulcers and I get really bad cramping. Other reaction(s): GI Upset (intolerance)    I have stomach ulcers and I get really bad cramping.    Other reaction(s): GI Upset (intolerance) Other reaction(s): GI Upset (intolerance)    Other reaction(s): GI Upset (intolerance) Other reaction(s): GI Upset (intolerance)  Other reaction(s): GI Upset (intolerance)  I have stomach ulcers and I get really bad cramping.   Nsaids Other (See Comments)   History of bleeding ulcers   Compazine  [prochlorperazine ]    Keppra  [levetiracetam ] Other (See Comments)   Makes her feel hungover    Phenergan  [promethazine ]    Emgality [galcanezumab-gnlm] Rash   Lorazepam  Itching        Medication List     TAKE these medications    diazepam  10 MG tablet Commonly known as: Valium  Take 1 tablet (10 mg total) by mouth every 6 (six) hours as needed (Seizure/aura).   escitalopram  20 MG tablet Commonly known as: LEXAPRO  Take 20 mg by mouth daily.   folic acid 1 MG tablet Commonly known as: FOLVITE Take 4 mg by mouth daily.   Heating Pads Pads 1 Pad by Does not apply route as needed (cramping).   multivitamin-prenatal 27-0.8 MG Tabs tablet Take 2 tablets by mouth daily at 12 noon.   ondansetron  4 MG disintegrating tablet Commonly known as: ZOFRAN -ODT Take 1 tablet (4 mg total) by mouth every 6 (six) hours as needed for nausea.   OXcarbazepine  150 MG tablet Commonly known as: Trileptal  Take 4 tablets (600 mg total) by mouth 2 (two) times daily.   valACYclovir  500 MG tablet Commonly known as: VALTREX  Take 500 mg by mouth daily as needed (Cold sores).        Follow-up Information     [facility] Follow up.   Specialty: Obstetrics and Gynecology Why: Continue prenatal care as scheduled Contact information: [address] [place]   [address] [phone]               Mardy Shropshire, MD FMOB Fellow, Faculty practice [facility], [facility] for [facility]  12/10/2023  2:55 PM

## 2023-12-10 NOTE — MAU Note (Addendum)
 Pamela Booker is a 28 y.o. at [redacted]w[redacted]d here in MAU reporting: fell on her right side on Friday. Has not felt baby move since. Stated she is not having pain in her abd but her stomach feels funny Denies any vag bleeding or leaking.   LMP:  Onset of complaint: Friday Pain score: 0 Vitals:   12/10/23 1438  BP: 123/73  Pulse: 83  Resp: 18  Temp: 98.5 F (36.9 C)     FHT: 145  Lab orders placed from triage:

## 2023-12-12 ENCOUNTER — Ambulatory Visit

## 2023-12-12 ENCOUNTER — Other Ambulatory Visit

## 2023-12-14 ENCOUNTER — Other Ambulatory Visit: Payer: Self-pay | Admitting: *Deleted

## 2023-12-14 ENCOUNTER — Ambulatory Visit (HOSPITAL_BASED_OUTPATIENT_CLINIC_OR_DEPARTMENT_OTHER)

## 2023-12-14 ENCOUNTER — Ambulatory Visit (HOSPITAL_BASED_OUTPATIENT_CLINIC_OR_DEPARTMENT_OTHER): Attending: Obstetrics and Gynecology | Admitting: Obstetrics

## 2023-12-14 VITALS — BP 118/59 | HR 73

## 2023-12-14 DIAGNOSIS — G40804 Other epilepsy, intractable, without status epilepticus: Secondary | ICD-10-CM | POA: Insufficient documentation

## 2023-12-14 DIAGNOSIS — Z79899 Other long term (current) drug therapy: Secondary | ICD-10-CM | POA: Insufficient documentation

## 2023-12-14 DIAGNOSIS — G40909 Epilepsy, unspecified, not intractable, without status epilepticus: Secondary | ICD-10-CM | POA: Diagnosis not present

## 2023-12-14 DIAGNOSIS — E041 Nontoxic single thyroid nodule: Secondary | ICD-10-CM | POA: Insufficient documentation

## 2023-12-14 DIAGNOSIS — Z363 Encounter for antenatal screening for malformations: Secondary | ICD-10-CM | POA: Diagnosis not present

## 2023-12-14 DIAGNOSIS — Z3A20 20 weeks gestation of pregnancy: Secondary | ICD-10-CM | POA: Diagnosis not present

## 2023-12-14 DIAGNOSIS — O99352 Diseases of the nervous system complicating pregnancy, second trimester: Secondary | ICD-10-CM | POA: Diagnosis present

## 2023-12-14 DIAGNOSIS — O99282 Endocrine, nutritional and metabolic diseases complicating pregnancy, second trimester: Secondary | ICD-10-CM | POA: Diagnosis not present

## 2023-12-14 DIAGNOSIS — O210 Mild hyperemesis gravidarum: Secondary | ICD-10-CM

## 2023-12-14 DIAGNOSIS — O09292 Supervision of pregnancy with other poor reproductive or obstetric history, second trimester: Secondary | ICD-10-CM | POA: Diagnosis not present

## 2023-12-14 DIAGNOSIS — Z9689 Presence of other specified functional implants: Secondary | ICD-10-CM | POA: Insufficient documentation

## 2023-12-14 DIAGNOSIS — O09299 Supervision of pregnancy with other poor reproductive or obstetric history, unspecified trimester: Secondary | ICD-10-CM

## 2023-12-14 DIAGNOSIS — Z362 Encounter for other antenatal screening follow-up: Secondary | ICD-10-CM

## 2023-12-14 NOTE — Progress Notes (Signed)
 MFM Consult Note  Pamela Booker is currently at 20 weeks and 0 days.  Pamela Booker was seen for a detailed fetal anatomy scan due to her history of poorly controlled seizure disorder.  Pamela Booker currently has a vagal nerve stimulator in place to help control her seizures.  Pamela Booker is also treated with Trileptal  600 mg twice a day and Valium  as needed for her seizure disorder.  Pamela Booker is being followed by a neurologist at [facility] in [place].   Pamela Booker is scheduled to have a thyroidectomy performed later this month due to an enlarging thyroid  nodule.  Pamela Booker denies any problems in her current pregnancy.    Pamela Booker had a cell free DNA test earlier in her pregnancy which indicated a low risk for trisomy 8, 82, and 13. A female fetus is predicted.   Pamela Booker was informed that the fetal growth and amniotic fluid level were appropriate for her gestational age.   There were no obvious fetal anomalies noted on today's ultrasound exam.  However, today's exam was limited due to the fetal position.  The patient was informed that anomalies may be missed due to technical limitations. If the fetus is in a suboptimal position or maternal habitus is increased, visualization of the fetus in the maternal uterus may be impaired.  The following were discussed during today's consultation:  Seizure disorder in pregnancy  The patient was advised to continue taking Trileptal  and Valium  as recommended by her neurologist.  Pamela Booker should continue close follow-up with her neurologist throughout her pregnancy.  We will continue to follow her with growth ultrasounds throughout her pregnancy.  The patient should have an anesthesia consult when Pamela Booker is admitted for delivery.  As the patient reports that pain may trigger her seizures, Pamela Booker should receive an early epidural for pain relief at the time of delivery.  An induction of labor should be scheduled at around 39 weeks so that Pamela Booker can receive early pain relief and avoid  seizures.    Pamela Booker understands that delivery prior to 39 weeks may be recommended should Pamela Booker develop preeclampsia again in her current pregnancy.    Due to her history of preeclampsia, a daily baby aspirin  was recommended for preeclampsia prophylaxis.  However, the patient stated that since Pamela Booker was treated with chronic NSAIDs in the past for pain related to endometriosis, Pamela Booker has developed stomach ulcers and therefore cannot tolerate taking a daily baby aspirin .  A follow-up growth scan was scheduled in 5 weeks to assess the fetal growth and to complete the views of the fetal anatomy.    The patient stated that all of her questions were answered today.  A total of 30 minutes was spent counseling and coordinating the care for this patient.  Greater than 50% of the time was spent in direct face-to-face contact.

## 2023-12-19 ENCOUNTER — Ambulatory Visit

## 2023-12-19 VITALS — BP 115/71 | HR 74 | Temp 98.6°F | Resp 20 | Ht 62.0 in | Wt 170.2 lb

## 2023-12-19 DIAGNOSIS — Z3A20 20 weeks gestation of pregnancy: Secondary | ICD-10-CM | POA: Diagnosis not present

## 2023-12-19 DIAGNOSIS — O210 Mild hyperemesis gravidarum: Secondary | ICD-10-CM | POA: Diagnosis not present

## 2023-12-19 MED ORDER — THIAMINE HCL 100 MG/ML IJ SOLN
Freq: Once | INTRAVENOUS | Status: AC
  Filled 2023-12-19: qty 1000

## 2023-12-19 NOTE — Progress Notes (Signed)
 Diagnosis: Hyperemesis gravidarum   Provider:  Praveen Mannam MD  Procedure: IV Infusion  IV Type: Peripheral, IV Location: R Antecubital  Banana Bag, Dose: 1000 ml  Infusion Start Time: 1420  Infusion Stop Time: 1637  Post Infusion IV Care: Peripheral IV Discontinued  Discharge: Condition: Good, Destination: Home . AVS Declined  Performed by:  Maximiano JONELLE Pouch, LPN

## 2023-12-21 ENCOUNTER — Ambulatory Visit: Admitting: Cardiology

## 2024-01-02 ENCOUNTER — Other Ambulatory Visit: Payer: Self-pay

## 2024-01-02 ENCOUNTER — Ambulatory Visit (INDEPENDENT_AMBULATORY_CARE_PROVIDER_SITE_OTHER): Payer: Self-pay | Admitting: Family Medicine

## 2024-01-02 VITALS — BP 122/80 | HR 92 | Wt 174.6 lb

## 2024-01-02 DIAGNOSIS — Z9689 Presence of other specified functional implants: Secondary | ICD-10-CM

## 2024-01-02 DIAGNOSIS — O09292 Supervision of pregnancy with other poor reproductive or obstetric history, second trimester: Secondary | ICD-10-CM

## 2024-01-02 DIAGNOSIS — Z3A22 22 weeks gestation of pregnancy: Secondary | ICD-10-CM

## 2024-01-02 DIAGNOSIS — O210 Mild hyperemesis gravidarum: Secondary | ICD-10-CM

## 2024-01-02 DIAGNOSIS — O0992 Supervision of high risk pregnancy, unspecified, second trimester: Secondary | ICD-10-CM

## 2024-01-02 DIAGNOSIS — E041 Nontoxic single thyroid nodule: Secondary | ICD-10-CM

## 2024-01-02 DIAGNOSIS — Z79899 Other long term (current) drug therapy: Secondary | ICD-10-CM

## 2024-01-02 DIAGNOSIS — G40909 Epilepsy, unspecified, not intractable, without status epilepticus: Secondary | ICD-10-CM

## 2024-01-02 DIAGNOSIS — O09299 Supervision of pregnancy with other poor reproductive or obstetric history, unspecified trimester: Secondary | ICD-10-CM

## 2024-01-02 DIAGNOSIS — Z9009 Acquired absence of other part of head and neck: Secondary | ICD-10-CM

## 2024-01-02 NOTE — Progress Notes (Signed)
   PRENATAL VISIT NOTE  Subjective:  Pamela Booker is a 28 y.o. [phone] at [redacted]w[redacted]d being seen today for ongoing prenatal care.  She is currently monitored for the following issues for this high-risk pregnancy and has PVC (premature ventricular contraction); S/P placement of VNS (vagus nerve stimulation) device; Epilepsy (HCC); Migraine; Hx of preeclampsia, prior pregnancy, currently pregnant; Supervision of high-risk pregnancy; Long-term current use of benzodiazepine; Hyperemesis gravidarum; Nonspecific syndrome suggestive of viral illness; Thyroid  nodule; and History of lobectomy of thyroid  on their problem list.  Patient reports no complaints.   . Vag. Bleeding: None.  Movement: Present. Denies leaking of fluid.   The following portions of the patient's history were reviewed and updated as appropriate: allergies, current medications, past family history, past medical history, past social history, past surgical history and problem list.   Objective:    Vitals:   01/02/24 1619  BP: 122/80  Pulse: 92  Weight: 174 lb 9.6 oz (79.2 kg)    Fetal Status:  Fetal Heart Rate (bpm): 150 Fundal Height: 22 cm Movement: Present    General: Alert, oriented and cooperative. Patient is in no acute distress.  Skin: Skin is warm and dry. No rash noted.   Cardiovascular: Normal heart rate noted  Respiratory: Normal respiratory effort, no problems with respiration noted  Abdomen: Soft, gravid, appropriate for gestational age.  Pain/Pressure: Absent     Pelvic: Cervical exam deferred        Extremities: Normal range of motion.  Edema: Trace  Mental Status: Normal mood and affect. Normal behavior. Normal judgment and thought content.   Assessment and Plan:  Pregnancy: G5P1031 at [redacted]w[redacted]d  1. Supervision of high risk pregnancy in second trimester (Primary) UP to date FH appropriate BSUS showed normal FM and HR Doing well Concerns: GERD-- recommended pepcid  and TUMs  2. Hyperemesis  gravidarum Better controlled Getting regular infusions  3. Hx of preeclampsia, prior pregnancy, currently pregnant Not on ASA- does not tolerate BP WNL Discussed magnesium and limited data but relatively no harm Has appt with OB cards  4. S/P placement of VNS (vagus nerve stimulation) device  5. Long-term current use of benzodiazepine  6. Nonintractable epilepsy without status epilepticus, unspecified epilepsy type (HCC) VNS in place Continue trileptal  and valium  Had a seizure after thyroid  surgery (7/24) but was able to resolve with 3g Ativan  per documentation-- this was in OR and they did not bring the magnet back into OR Per MFM IOL at 39 weeks   7. History of lobectomy of thyroid  Removed in July  8. Thyroid  nodule Removed with lobectomy  Preterm labor symptoms and general obstetric precautions including but not limited to vaginal bleeding, contractions, leaking of fluid and fetal movement were reviewed in detail with the patient. Please refer to After Visit Summary for other counseling recommendations.   Return in about 4 weeks (around 01/30/2024) for Routine prenatal care.  Future Appointments  Date Time Provider Department Center  01/03/2024  2:00 PM [facility]-CHAIR 4 [facility] None  01/17/2024  2:00 PM [facility]INF-CHAIR 5 [facility] None  01/18/2024  2:00 PM [facility] PROVIDER 1 [facility]  01/18/2024  2:30 PM [facility] US5 [facility]  02/15/2024  3:00 PM Tobb, Kardie, DO [facility] None    Suzen Maryan Masters, MD

## 2024-01-03 ENCOUNTER — Ambulatory Visit (INDEPENDENT_AMBULATORY_CARE_PROVIDER_SITE_OTHER)

## 2024-01-03 VITALS — BP 115/74 | HR 87 | Temp 98.1°F | Resp 20 | Ht 62.0 in | Wt 175.6 lb

## 2024-01-03 DIAGNOSIS — O210 Mild hyperemesis gravidarum: Secondary | ICD-10-CM

## 2024-01-03 DIAGNOSIS — Z3A22 22 weeks gestation of pregnancy: Secondary | ICD-10-CM

## 2024-01-03 MED ORDER — THIAMINE HCL 100 MG/ML IJ SOLN
Freq: Once | INTRAVENOUS | Status: AC
  Filled 2024-01-03: qty 1000

## 2024-01-03 NOTE — Progress Notes (Signed)
 Diagnosis: Hyperemesis gravidarum   Provider:  Praveen Mannam MD  Procedure: IV Infusion  IV Type: Peripheral, IV Location: R Antecubital  Banana Bag, Dose: 1000 ml  Infusion Start Time: 1428  Infusion Stop Time: 1555  Post Infusion IV Care: Peripheral IV Discontinued  Discharge: Condition: Good, Destination: Home . AVS Declined  Performed by:  Maximiano JONELLE Pouch, LPN

## 2024-01-06 ENCOUNTER — Ambulatory Visit
Admission: EM | Admit: 2024-01-06 | Discharge: 2024-01-06 | Disposition: A | Discharge status: Home / Self Care | Attending: Emergency Medicine | Admitting: Emergency Medicine

## 2024-01-06 DIAGNOSIS — L089 Local infection of the skin and subcutaneous tissue, unspecified: Secondary | ICD-10-CM | POA: Diagnosis not present

## 2024-01-06 DIAGNOSIS — B9689 Other specified bacterial agents as the cause of diseases classified elsewhere: Secondary | ICD-10-CM

## 2024-01-06 DIAGNOSIS — L231 Allergic contact dermatitis due to adhesives: Secondary | ICD-10-CM | POA: Diagnosis not present

## 2024-01-06 MED ORDER — CEPHALEXIN 500 MG PO CAPS
500.0000 mg | ORAL_CAPSULE | Freq: Four times a day (QID) | ORAL | 0 refills | Status: AC
Start: 2024-01-06 — End: 2024-01-13

## 2024-01-06 MED ORDER — TRIAMCINOLONE ACETONIDE 0.1 % EX CREA: 1.0000 | TOPICAL_CREAM | Freq: Two times a day (BID) | CUTANEOUS | 0 refills | Status: DC

## 2024-01-06 MED ORDER — MUPIROCIN 2 % EX OINT: 1.0000 | TOPICAL_OINTMENT | Freq: Two times a day (BID) | CUTANEOUS | 0 refills | Status: DC

## 2024-01-06 NOTE — Discharge Instructions (Signed)
 triamcinolone  cream for inflammation, pain and itching, Bactroban  ointment for secondary infection, specifically to cover MRSA, Claritin 10 mg daily for itching.  Keflex  500 mg 4 times daily for 7 days if the Bactroban  does not help to cover possible impetigo.  Follow-up with your surgeon if not better in several days.

## 2024-01-06 NOTE — ED Provider Notes (Signed)
 HPI  SUBJECTIVE:  Pamela Booker is a G5 P50  28 y.o. female currently 23 weeks 3 days pregnant who presents with an itchy, erythematous, burning, painful rash around the surgical site where she had her thyroid  removed on 7/24.  She same the scab came off 4 days ago.  She states that the area is getting darker, but has not changed in size.  She reports erythematous bumps.  In some crusting.  She states that she is unable to sleep at night because of the itching and burning.  She has tried applying Vaseline which has made her symptoms worse.  No alleviating factors.  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  She is being monitored by [facility] for this pregnancy.  Denies leakage of fluid, contractions, vaginal bleeding.  States that she is feeling baby move.  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     EAB 11/19.   Started back on continuous nuvaring thereafter  1/20- excessive bleeding. U/s with 3cm EMS. Placed on provera 10mg  bid in addition to nuvaring.  Encourage progesterone  taper. Have full  menses at end of 3wks of Nuvaring.   2/20- u/s with resolved thickening of EMS to 4mm  Restart continuous nuvaring   EAB 11/19.   Sta   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/04/2022   Formatting of this note might be different from the original. Pap 3/22- LGSIL. 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     Longstanding. Reports occasional tearing and discomfort with sex.   She would like hormones checked.  She states that her estrogen level was low several years ago after a multiyear course of Depo-Provera.  Labs with low estradiol , FSH, and LH. Normal prolactin & TSH.[name]  Discussed  increasing her estrogen level by changing to a higher     Past Surgical History:  Procedure Laterality Date   CYSTOSCOPY W/ URETERAL STENT REMOVAL Left 01/09/2023   Procedure: CYSTOSCOPY WITH STENT REMOVAL;  Surgeon: Twylla Glendia BROCKS, MD;  Location: [facility];  Service: Urology;  Laterality: Left;   CYSTOSCOPY/URETEROSCOPY/HOLMIUM LASER/STENT PLACEMENT Left 01/02/2023   Procedure: CYSTOSCOPY/URETEROSCOPY/HOLMIUM LASER/STENT PLACEMENT;  Surgeon: Twylla Glendia BROCKS, MD;  Location: [facility];  Service: Urology;  Laterality: Left;   CYSTOSCOPY/URETEROSCOPY/HOLMIUM LASER/STENT  PLACEMENT Right 01/09/2023   Procedure: CYSTOSCOPY/URETEROSCOPY/HOLMIUM LASER/STENT PLACEMENT;  Surgeon: Twylla Glendia BROCKS, MD;  Location: [facility];  Service: Urology;  Laterality: Right;   DILATION AND EVACUATION N/A 05/17/2023   Procedure: DILATATION AND EVACUATION;  Surgeon: Izell Harari, MD;  Location: [facility];  Service: Gynecology;  Laterality: N/A;   ENDOMETRIAL ABLATION  06/2022   [facility]   LAPAROSCOPY     x5 for endometriosis   LOOP RECORDER INSERTION  2020   LOOP RECORDER REMOVAL  2021   TONSILLECTOMY     URETEROSCOPY Left 01/09/2023   Procedure: URETEROSCOPY;  Surgeon: Twylla Glendia BROCKS, MD;  Location: [facility];  Service: Urology;  Laterality: Left;   VAGUS NERVE STIMULATOR INSERTION  2022    Family History  Adopted: Yes  Problem Relation Age of Onset   Healthy Daughter     Social History   Tobacco Use   Smoking status: Never   Smokeless tobacco: Never  Vaping Use   Vaping status: Former   Substances: Nicotine  Substance Use Topics   Alcohol use: Not Currently    Comment: socially   Drug use: Not Currently    Comment: THC    No current facility-administered medications for this encounter.  Current Outpatient Medications:    cephALEXin  (KEFLEX ) 500 MG capsule, Take 1 capsule (500 mg total) by mouth 4 (four) times daily for 7 days., Disp: 28 capsule, Rfl: 0   diazepam  (VALIUM ) 10 MG tablet, Take 1 tablet (10 mg total) by mouth every 6 (six) hours as needed (Seizure/aura)., Disp: 30 tablet, Rfl: 0   escitalopram  (LEXAPRO ) 20 MG tablet, Take 20 mg by mouth daily., Disp: , Rfl:    folic acid (FOLVITE) 1 MG tablet, Take 4 mg by mouth daily., Disp: , Rfl:    Heating Pads PADS, 1 Pad by Does not apply route as needed (cramping)., Disp: , Rfl:    mupirocin  ointment (BACTROBAN ) 2 %, Apply 1 Application topically 2 (two) times daily., Disp: 22 g, Rfl: 0   ondansetron  (ZOFRAN -ODT) 4 MG disintegrating tablet, Take 1 tablet (4 mg total) by mouth every 6 (six) hours as needed for  nausea., Disp: 30 tablet, Rfl: 1   OXcarbazepine  (TRILEPTAL ) 150 MG tablet, Take 4 tablets (600 mg total) by mouth 2 (two) times daily., Disp: , Rfl:    Prenatal Vit-Fe Fumarate-FA (MULTIVITAMIN-PRENATAL) 27-0.8 MG TABS tablet, Take 2 tablets by mouth daily at 12 noon., Disp: , Rfl:    triamcinolone  cream (KENALOG ) 0.1 %, Apply 1 Application topically 2 (two) times daily. Apply for 2 weeks. May use on face, Disp: 30 g, Rfl: 0   valACYclovir  (VALTREX ) 500 MG tablet, Take 500 mg by mouth daily as needed (Cold sores)., Disp: , Rfl:   Allergies  Allergen Reactions   Zolpidem Other (See Comments)    seizure  Other Reaction(s): Other (See Comments)    seizure   Diphenhydramine  Other (See Comments)    Crawling feeling under skin  Crawling feeling under skin  It feels as if I have things crawling on my skin.  It feels as if I have things crawling on my skin.     Crawling feeling under skin    It feels as if I have things crawling on my skin.   Crawling feeling under skin    Crawling feeling under skin  It feels as if I have things crawling on my skin.   Ibuprofen Other (See Comments)    Other reaction(s): GI Upset (intolerance)  Other reaction(s): GI Upset (intolerance) Other reaction(s): GI Upset (intolerance)    Other reaction(s): GI Upset (intolerance)    I have stomach ulcers and I get really bad cramping.  Other reaction(s): GI Upset (intolerance)    I have stomach ulcers and I get really bad cramping.    Other reaction(s): GI Upset (intolerance) Other reaction(s): GI Upset (intolerance)    Other reaction(s): GI Upset (intolerance) Other reaction(s): GI Upset (intolerance)  Other reaction(s): GI Upset (intolerance)  I have stomach ulcers and I get really bad cramping.   Nsaids Other (See Comments)    History of bleeding ulcers   Compazine  [Prochlorperazine ]    Keppra  [Levetiracetam ] Other (See Comments)    Makes her feel hungover    Phenergan   [Promethazine ]    Emgality [Galcanezumab-Gnlm] Rash   Lorazepam  Itching     ROS  As noted in HPI.   Physical Exam  BP 108/77 (BP Location: Left Arm)   Pulse 78   Temp 99.1 F (37.3 C) (Oral)   Ht 5' 2 (1.575 m)   Wt 78.9 kg   LMP 07/27/2023   SpO2 97%   BMI 31.83 kg/m   Constitutional: Well developed, well nourished, no acute distress Eyes:  EOMI, conjunctiva normal bilaterally HENT: Normocephalic, atraumatic,mucus membranes moist Respiratory: Normal inspiratory effort Cardiovascular: Normal rate GI: nondistended skin: Tender erythematous healing surgical scar with yellowish crusting on neck  Musculoskeletal: no deformities Neurologic: Alert & oriented x 3, no focal neuro deficits Psychiatric: Speech and behavior appropriate   ED Course   Medications - No data to display  No orders of the defined types were placed in this encounter.   No results found for this or any previous visit (from the past 24 hours). No results found.  ED Clinical Impression  1. Contact dermatitis due to adhesives, unspecified contact dermatitis type   2. Superficial bacterial skin infection      ED Assessment/Plan     Concern for contact dermatitis from the tissue adhesive with secondary infection.  Will send home with triamcinolone  cream for inflammation, pain and itching, Bactroban  ointment to cover MRSA, Claritin for itching.  Keflex  500 mg 4 times daily for 7 days if the Bactroban  does not help to cover possible impetigo.  Follow-up with her surgeon if not better in several days  Discussed  MDM, treatment plan, and plan for follow-up with patient.  patient agrees with plan.   Meds ordered this encounter  Medications   triamcinolone  cream (KENALOG ) 0.1 %    Sig: Apply 1 Application topically 2 (two) times daily. Apply for 2 weeks. May use on face    Dispense:  30 g    Refill:  0   mupirocin  ointment (BACTROBAN ) 2 %    Sig: Apply 1 Application topically 2 (two) times  daily.    Dispense:  22 g    Refill:  0   cephALEXin  (KEFLEX ) 500 MG capsule    Sig: Take 1 capsule (500 mg total) by mouth 4 (  four) times daily for 7 days.    Dispense:  28 capsule    Refill:  0      *This clinic note was created using Scientist, clinical (histocompatibility and immunogenetics). Therefore, there may be occasional mistakes despite careful proofreading.  ?    Van Knee, MD 01/06/24 [phone]

## 2024-01-06 NOTE — ED Triage Notes (Signed)
 Pt is with her husband  Pt c/o rash along neck  Pt states that on 12/27/23 she had the last of her thyroid  removed and had a drain placed  Pt states that around the surgical scar, she had sutures and surigical glue and she believes that she is allergic to the surgical glue.  Pt has been placing Vasaline on the wound  Pt states that the wound is beginning to dry out.

## 2024-01-07 ENCOUNTER — Ambulatory Visit

## 2024-01-17 ENCOUNTER — Ambulatory Visit (INDEPENDENT_AMBULATORY_CARE_PROVIDER_SITE_OTHER)

## 2024-01-17 VITALS — BP 112/75 | HR 83 | Temp 98.3°F | Resp 16 | Ht 62.0 in | Wt 181.2 lb

## 2024-01-17 DIAGNOSIS — O210 Mild hyperemesis gravidarum: Secondary | ICD-10-CM | POA: Diagnosis not present

## 2024-01-17 DIAGNOSIS — Z3A24 24 weeks gestation of pregnancy: Secondary | ICD-10-CM

## 2024-01-17 MED ORDER — SODIUM CHLORIDE 0.9 % IV SOLN
Freq: Once | INTRAVENOUS | Status: AC
  Filled 2024-01-17: qty 1000

## 2024-01-17 MED ORDER — THIAMINE HCL 100 MG/ML IJ SOLN
100.0000 mg | Freq: Once | INTRAVENOUS | Status: AC
  Administered 2024-01-17: 100 mg via INTRAVENOUS
  Filled 2024-01-17: qty 1

## 2024-01-17 NOTE — Progress Notes (Signed)
 Diagnosis: Hyperemesis gravidarum [   Provider:  Praveen Mannam MD  Procedure: IV Infusion  IV Type: Peripheral, IV Location: R Antecubital  Banana Bag, Dose: 1000 ml  Infusion Start Time: 1517  Infusion Stop Time: 1731  Post Infusion IV Care: Peripheral IV Discontinued  Discharge: Condition: Good, Destination: Home . AVS Declined  Performed by:  Maximiano JONELLE Pouch, LPN    Thiamine , Dose: 100 mg  Infusion Start Time: 1440  Infusion Stop Time: 1510

## 2024-01-18 ENCOUNTER — Ambulatory Visit: Attending: Obstetrics | Admitting: Obstetrics and Gynecology

## 2024-01-18 ENCOUNTER — Other Ambulatory Visit: Payer: Self-pay | Admitting: *Deleted

## 2024-01-18 ENCOUNTER — Ambulatory Visit (HOSPITAL_BASED_OUTPATIENT_CLINIC_OR_DEPARTMENT_OTHER)

## 2024-01-18 VITALS — BP 122/71 | HR 88

## 2024-01-18 DIAGNOSIS — Z3A25 25 weeks gestation of pregnancy: Secondary | ICD-10-CM | POA: Diagnosis not present

## 2024-01-18 DIAGNOSIS — Z362 Encounter for other antenatal screening follow-up: Secondary | ICD-10-CM

## 2024-01-18 DIAGNOSIS — G40909 Epilepsy, unspecified, not intractable, without status epilepticus: Secondary | ICD-10-CM

## 2024-01-18 DIAGNOSIS — O09292 Supervision of pregnancy with other poor reproductive or obstetric history, second trimester: Secondary | ICD-10-CM | POA: Insufficient documentation

## 2024-01-18 DIAGNOSIS — O09299 Supervision of pregnancy with other poor reproductive or obstetric history, unspecified trimester: Secondary | ICD-10-CM

## 2024-01-18 DIAGNOSIS — O99352 Diseases of the nervous system complicating pregnancy, second trimester: Secondary | ICD-10-CM | POA: Diagnosis not present

## 2024-01-18 DIAGNOSIS — Z79899 Other long term (current) drug therapy: Secondary | ICD-10-CM

## 2024-01-18 NOTE — Progress Notes (Signed)
 After review, MFM consult with provider is not indicated for today  Arna Ranks, MD 01/18/2024 4:04 PM  [facility]

## 2024-01-19 ENCOUNTER — Inpatient Hospital Stay (HOSPITAL_COMMUNITY)
Admission: AD | Admit: 2024-01-19 | Discharge: 2024-01-19 | Disposition: A | Discharge status: Home / Self Care | Attending: Obstetrics and Gynecology | Admitting: Obstetrics and Gynecology

## 2024-01-19 ENCOUNTER — Encounter (HOSPITAL_COMMUNITY): Payer: Self-pay | Admitting: Obstetrics and Gynecology

## 2024-01-19 DIAGNOSIS — B9689 Other specified bacterial agents as the cause of diseases classified elsewhere: Secondary | ICD-10-CM | POA: Diagnosis not present

## 2024-01-19 DIAGNOSIS — O23592 Infection of other part of genital tract in pregnancy, second trimester: Secondary | ICD-10-CM | POA: Insufficient documentation

## 2024-01-19 DIAGNOSIS — M791 Myalgia, unspecified site: Secondary | ICD-10-CM

## 2024-01-19 DIAGNOSIS — N760 Acute vaginitis: Secondary | ICD-10-CM | POA: Diagnosis not present

## 2024-01-19 DIAGNOSIS — O26892 Other specified pregnancy related conditions, second trimester: Secondary | ICD-10-CM

## 2024-01-19 DIAGNOSIS — N949 Unspecified condition associated with female genital organs and menstrual cycle: Secondary | ICD-10-CM

## 2024-01-19 DIAGNOSIS — M7918 Myalgia, other site: Secondary | ICD-10-CM | POA: Insufficient documentation

## 2024-01-19 DIAGNOSIS — Z3A25 25 weeks gestation of pregnancy: Secondary | ICD-10-CM

## 2024-01-19 LAB — URINALYSIS, ROUTINE W REFLEX MICROSCOPIC
Bilirubin Urine: NEGATIVE
Glucose, UA: 50 mg/dL — AB
Hgb urine dipstick: NEGATIVE
Ketones, ur: NEGATIVE mg/dL
Leukocytes,Ua: NEGATIVE
Nitrite: NEGATIVE
Protein, ur: NEGATIVE mg/dL
Specific Gravity, Urine: 1.01 (ref 1.005–1.030)
pH: 7 (ref 5.0–8.0)

## 2024-01-19 LAB — WET PREP, GENITAL
Sperm: NONE SEEN
Trich, Wet Prep: NONE SEEN
WBC, Wet Prep HPF POC: >=10 — AB (ref <10)
Yeast Wet Prep HPF POC: NONE SEEN

## 2024-01-19 MED ORDER — CYCLOBENZAPRINE HCL 10 MG PO TABS: 10.0000 mg | ORAL_TABLET | Freq: Two times a day (BID) | ORAL | 0 refills | Status: DC | PRN

## 2024-01-19 MED ORDER — ACETAMINOPHEN 500 MG PO TABS
1000.0000 mg | ORAL_TABLET | Freq: Once | ORAL | Status: AC
  Administered 2024-01-19: 1000 mg via ORAL
  Filled 2024-01-19: qty 2

## 2024-01-19 MED ORDER — CYCLOBENZAPRINE HCL 5 MG PO TABS
10.0000 mg | ORAL_TABLET | Freq: Once | ORAL | Status: AC
  Administered 2024-01-19: 10 mg via ORAL
  Filled 2024-01-19: qty 2

## 2024-01-19 MED ORDER — METRONIDAZOLE 500 MG PO TABS: 500.0000 mg | ORAL_TABLET | Freq: Two times a day (BID) | ORAL | 0 refills | Status: DC

## 2024-01-19 NOTE — MAU Provider Note (Signed)
 Chief Complaint:  Abdominal Pain   HPI   Event Date/Time   First Provider Initiated Contact with Patient 01/19/24 2136      Pamela Booker is a 28 y.o. [id number] at [redacted]w[redacted]d who presents to maternity admissions reporting intermittent left lower quadrant pain that started around 6 PM today.  Patient reports that it does radiate around to her stomach.  She also reports that she has been suffering from sciatica and other musculoskeletal pains during this pregnancy    She denies any vaginal bleeding, leaking of fluid, contractions and reports good fetal movements..   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     EAB 11/19.   Started back on continuous nuvaring thereafter  1/20- excessive bleeding. U/s with 3cm EMS. Placed on provera 10mg  bid in addition to nuvaring.  Encourage progesterone  taper. Have full menses at end of 3wks of Nuvaring.   2/20- u/s with resolved thickening of EMS to 4mm  Restart continuous nuvaring   EAB 11/19.   Sta   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/04/2022   Formatting of this note might be different from the original. Pap 3/22- LGSIL. Colpo 4/22- CIN 1 Pap 10/23- negative     Migraine headache 07/05/2021   Last Assessment & Plan:     As needed Fioricet  for  now     Migraine without aura and responsive to treatment 12/11/2014   Migraines    Nausea & vomiting 11/23/2022   Ovarian cyst    Palpitations    Partial epilepsy with impairment of consciousness (HCC) 06/06/2022   PVC (premature ventricular contraction) 03/11/2018   Seizures (HCC)    last seizure was 2 weeks ago (app.12-18-22)   Shortness of breath 04/25/2016   Status post placement of VNS (vagus nerve stimulation) device    Syncope and collapse 06/10/2019   TB lung, 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     Longstanding. Reports occasional tearing and discomfort with sex.   She would like hormones checked.  She states that her estrogen level was low several years ago after a multiyear course of Depo-Provera.  Labs with low estradiol , FSH, and LH. Normal prolactin & TSH.[name]  Discussed increasing her estrogen level by changing to a higher    OB History  Gravida Para Term Preterm AB Living  5 1 1  0 3 1  SAB IAB Ectopic Multiple Live Births  3 0 0 0 1    # Outcome Date GA Lbr Len/2nd Weight Sex Type Anes PTL Lv  5 Current  4 SAB 05/2023          3 SAB 2021     Biochemical     2 SAB 2019          1 Term 10/17/13 [redacted]w[redacted]d  2778 g F Vag-Spont   LIV     Complications: Preeclampsia   Past Surgical History:  Procedure Laterality Date   CYSTOSCOPY W/ URETERAL STENT REMOVAL Left 01/09/2023   Procedure: CYSTOSCOPY WITH STENT REMOVAL;  Surgeon: Twylla Glendia BROCKS, MD;  Location: [facility];  Service: Urology;  Laterality: Left;   CYSTOSCOPY/URETEROSCOPY/HOLMIUM LASER/STENT PLACEMENT Left 01/02/2023   Procedure: CYSTOSCOPY/URETEROSCOPY/HOLMIUM LASER/STENT PLACEMENT;  Surgeon: Twylla Glendia BROCKS, MD;  Location: [facility];  Service: Urology;  Laterality: Left;   CYSTOSCOPY/URETEROSCOPY/HOLMIUM LASER/STENT PLACEMENT Right 01/09/2023    Procedure: CYSTOSCOPY/URETEROSCOPY/HOLMIUM LASER/STENT PLACEMENT;  Surgeon: Twylla Glendia BROCKS, MD;  Location: [facility];  Service: Urology;  Laterality: Right;   DILATION AND EVACUATION N/A 05/17/2023   Procedure: DILATATION AND EVACUATION;  Surgeon: Izell Harari, MD;  Location: [facility];  Service: Gynecology;  Laterality: N/A;   ENDOMETRIAL ABLATION  06/2022   [facility]   LAPAROSCOPY     x5 for endometriosis   LOOP RECORDER INSERTION  2020   LOOP RECORDER REMOVAL  2021   THYROIDECTOMY, PARTIAL     TONSILLECTOMY     URETEROSCOPY Left 01/09/2023   Procedure: URETEROSCOPY;  Surgeon: Twylla Glendia BROCKS, MD;  Location: [facility];  Service: Urology;  Laterality: Left;   VAGUS NERVE STIMULATOR INSERTION  2022   Family History  Adopted: Yes  Problem Relation Age of Onset   Healthy Daughter    Social History   Tobacco Use   Smoking status: Never   Smokeless tobacco: Never  Vaping Use   Vaping status: Former   Substances: Nicotine  Substance Use Topics   Alcohol use: Not Currently    Comment: socially   Drug use: Not Currently    Comment: THC   Allergies  Allergen Reactions   Zolpidem Other (See Comments)    seizure  Other Reaction(s): Other (See Comments)    seizure   Diphenhydramine  Other (See Comments)    Crawling feeling under skin  Crawling feeling under skin    It feels as if I have things crawling on my skin.  It feels as if I have things crawling on my skin.     Crawling feeling under skin    It feels as if I have things crawling on my skin.   Crawling feeling under skin    Crawling feeling under skin  It feels as if I have things crawling on my skin.   Ibuprofen Other (See Comments)    Other reaction(s): GI Upset (intolerance)  Other reaction(s): GI Upset (intolerance) Other reaction(s): GI Upset (intolerance)    Other reaction(s): GI Upset (intolerance)    I have stomach ulcers and I get really bad cramping.  Other reaction(s): GI Upset  (intolerance)    I have stomach ulcers and I get really bad cramping.    Other reaction(s): GI Upset (intolerance) Other reaction(s): GI Upset (intolerance)    Other reaction(s): GI Upset (intolerance) Other reaction(s): GI Upset (intolerance)  Other reaction(s): GI Upset (intolerance)  I have stomach ulcers and I get really bad cramping.   Nsaids Other (See Comments)    History of bleeding ulcers   Compazine  [Prochlorperazine ]    Keppra  [Levetiracetam ] Other (See Comments)    Makes her feel hungover    Phenergan  [Promethazine ]    Emgality [  Galcanezumab-Gnlm] Rash   Lorazepam  Itching   Medications Prior to Admission  Medication Sig Dispense Refill Last Dose/Taking   escitalopram  (LEXAPRO ) 20 MG tablet Take 20 mg by mouth daily.   01/19/2024   famotidine  (PEPCID ) 10 MG tablet Take 10 mg by mouth 2 (two) times daily.   01/19/2024   folic acid (FOLVITE) 1 MG tablet Take 4 mg by mouth daily.   01/19/2024   OXcarbazepine  (TRILEPTAL ) 150 MG tablet Take 4 tablets (600 mg total) by mouth 2 (two) times daily.   01/19/2024   Prenatal Vit-Fe Fumarate-FA (MULTIVITAMIN-PRENATAL) 27-0.8 MG TABS tablet Take 2 tablets by mouth daily at 12 noon.   01/19/2024   pyridoxine  (B-6) 100 MG tablet Take 100 mg by mouth daily.   01/19/2024   diazepam  (VALIUM ) 10 MG tablet Take 1 tablet (10 mg total) by mouth every 6 (six) hours as needed (Seizure/aura). 30 tablet 0    Heating Pads PADS 1 Pad by Does not apply route as needed (cramping).      mupirocin  ointment (BACTROBAN ) 2 % Apply 1 Application topically 2 (two) times daily. 22 g 0    ondansetron  (ZOFRAN -ODT) 4 MG disintegrating tablet Take 1 tablet (4 mg total) by mouth every 6 (six) hours as needed for nausea. 30 tablet 1    triamcinolone  cream (KENALOG ) 0.1 % Apply 1 Application topically 2 (two) times daily. Apply for 2 weeks. May use on face 30 g 0    valACYclovir  (VALTREX ) 500 MG tablet Take 500 mg by mouth daily as needed (Cold sores).       I have  reviewed patient's Past Medical Hx, Surgical Hx, Family Hx, Social Hx, medications and allergies.   ROS  Pertinent items noted in HPI and remainder of comprehensive ROS otherwise negative.   PHYSICAL EXAM  Patient Vitals for the past 24 hrs:  BP Temp Temp src Pulse Resp SpO2 Height Weight  01/19/24 2130 -- -- -- -- -- 97 % -- --  01/19/24 2036 127/73 98.4 F (36.9 C) Oral 85 18 91 % 5' 2 (1.575 m) 82.7 kg    Constitutional: Well-developed, well-nourished female in no acute distress.  Cardiovascular: normal rate & rhythm, warm and well-perfused Respiratory: normal effort, no problems with respiration noted GI: Abd soft, non-tender, gravid, mild pain b/l lower pelvis near round ligaments  MS: Extremities nontender, no edema, normal ROM Neurologic: Alert and oriented x 4.  GU: no CVA tenderness Pelvic: Deferred ( Blind vaginal swabs )     Fetal Tracing: Cat 1 reactive  for GA Baseline: 135-140 Variability:moderate Accelerations: present Decelerations:absent Toco: no ctx   Labs: Results for orders placed or performed during the hospital encounter of 01/19/24 (from the past 24 hours)  Urinalysis, Routine w reflex microscopic -Urine, Clean Catch     Status: Abnormal   Collection Time: 01/19/24  8:36 PM  Result Value Ref Range   Color, Urine YELLOW YELLOW   APPearance HAZY (A) CLEAR   Specific Gravity, Urine 1.010 1.005 - 1.030   pH 7.0 5.0 - 8.0   Glucose, UA 50 (A) NEGATIVE mg/dL   Hgb urine dipstick NEGATIVE NEGATIVE   Bilirubin Urine NEGATIVE NEGATIVE   Ketones, ur NEGATIVE NEGATIVE mg/dL   Protein, ur NEGATIVE NEGATIVE mg/dL   Nitrite NEGATIVE NEGATIVE   Leukocytes,Ua NEGATIVE NEGATIVE  Wet prep, genital     Status: Abnormal   Collection Time: 01/19/24 10:25 PM   Specimen: PATH Cytology Cervicovaginal Ancillary Only  Result Value Ref Range   Yeast Wet  Prep HPF POC NONE SEEN NONE SEEN   Trich, Wet Prep NONE SEEN NONE SEEN   Clue Cells Wet Prep HPF POC PRESENT (A) NONE  SEEN   WBC, Wet Prep HPF POC >=10 (A) <10   Sperm NONE SEEN     Imaging:  No results found.  MDM & MAU COURSE  MDM:  HIGH  Prenatal records reviewed Physical exam performed Vaginal swabs  obtained (wet prep consistent with BV-will plan for prescription at discharge) NST for gestational age and fetal reassurance (category 1 reactive for gestational age) Tylenol  and Flexeril  ordered for pain with resolve Likely musculoskeletal pain associated with pregnancy and/or round ligament pain (Flexeril  Rx at discharge)    MAU Course: Orders Placed This Encounter  Procedures   Wet prep, genital   Urinalysis, Routine w reflex microscopic -Urine, Clean Catch   Discharge patient Discharge disposition: 01-Home or Self Care; Discharge patient date: 01/19/2024   Meds ordered this encounter  Medications   acetaminophen  (TYLENOL ) tablet 1,000 mg   cyclobenzaprine  (FLEXERIL ) tablet 10 mg   metroNIDAZOLE  (FLAGYL ) 500 MG tablet    Sig: Take 1 tablet (500 mg total) by mouth 2 (two) times daily.    Dispense:  14 tablet    Refill:  0    Supervising Provider:   PRATT, TANYA S [[id number]]   cyclobenzaprine  (FLEXERIL ) 10 MG tablet    Sig: Take 1 tablet (10 mg total) by mouth 2 (two) times daily as needed for muscle spasms.    Dispense:  20 tablet    Refill:  0    Supervising Provider:   PRATT, TANYA S [[id number]]     I have reviewed the patient chart and performed the physical exam . I have ordered & interpreted the lab results and reviewed and interpreted the NST Medications ordered as stated below.  A/P as described below.  Counseling and education provided and patient agreeable  with plan as described below. Verbalized understanding.    ASSESSMENT   1. Bacterial vaginosis   2. Round ligament pain   3. Muscular pain   4. [redacted] weeks gestation of pregnancy     PLAN  Discharge home in stable condition with return precautions.   See AVS for full description of information given to the patient  including both verbal and written. Patient verbalized understanding and agrees with the plan as described above.  ]   Follow-up Information     [facility].   Specialty: Obstetrics and Gynecology Why: If symptoms worsen or fail to resolve, As scheduled for ongoing prenatal care Contact information: [address] [place]  [address] [phone]                Allergies as of 01/19/2024       Reactions   Zolpidem Other (See Comments)   seizure Other Reaction(s): Other (See Comments)    seizure   Diphenhydramine  Other (See Comments)   Crawling feeling under skin Crawling feeling under skin    It feels as if I have things crawling on my skin. It feels as if I have things crawling on my skin.     Crawling feeling under skin    It feels as if I have things crawling on my skin.   Crawling feeling under skin    Crawling feeling under skin  It feels as if I have things crawling on my skin.   Ibuprofen Other (See Comments)   Other reaction(s):  GI Upset (intolerance) Other reaction(s): GI Upset (intolerance) Other reaction(s): GI Upset (intolerance)    Other reaction(s): GI Upset (intolerance)    I have stomach ulcers and I get really bad cramping. Other reaction(s): GI Upset (intolerance)    I have stomach ulcers and I get really bad cramping.    Other reaction(s): GI Upset (intolerance) Other reaction(s): GI Upset (intolerance)    Other reaction(s): GI Upset (intolerance) Other reaction(s): GI Upset (intolerance)  Other reaction(s): GI Upset (intolerance)  I have stomach ulcers and I get really bad cramping.   Nsaids Other (See Comments)   History of bleeding ulcers   Compazine  [prochlorperazine ]    Keppra  [levetiracetam ] Other (See Comments)   Makes her feel hungover    Phenergan  [promethazine ]    Emgality [galcanezumab-gnlm] Rash   Lorazepam  Itching         Medication List     STOP taking these medications    diazepam  10 MG tablet Commonly known as: Valium        TAKE these medications    cyclobenzaprine  10 MG tablet Commonly known as: FLEXERIL  Take 1 tablet (10 mg total) by mouth 2 (two) times daily as needed for muscle spasms.   escitalopram  20 MG tablet Commonly known as: LEXAPRO  Take 20 mg by mouth daily.   famotidine  10 MG tablet Commonly known as: PEPCID  Take 10 mg by mouth 2 (two) times daily.   folic acid 1 MG tablet Commonly known as: FOLVITE Take 4 mg by mouth daily.   Heating Pads Pads 1 Pad by Does not apply route as needed (cramping).   metroNIDAZOLE  500 MG tablet Commonly known as: FLAGYL  Take 1 tablet (500 mg total) by mouth 2 (two) times daily.   multivitamin-prenatal 27-0.8 MG Tabs tablet Take 2 tablets by mouth daily at 12 noon.   mupirocin  ointment 2 % Commonly known as: BACTROBAN  Apply 1 Application topically 2 (two) times daily.   ondansetron  4 MG disintegrating tablet Commonly known as: ZOFRAN -ODT Take 1 tablet (4 mg total) by mouth every 6 (six) hours as needed for nausea.   OXcarbazepine  150 MG tablet Commonly known as: Trileptal  Take 4 tablets (600 mg total) by mouth 2 (two) times daily.   pyridoxine  100 MG tablet Commonly known as: B-6 Take 100 mg by mouth daily.   triamcinolone  cream 0.1 % Commonly known as: KENALOG  Apply 1 Application topically 2 (two) times daily. Apply for 2 weeks. May use on face   valACYclovir  500 MG tablet Commonly known as: VALTREX  Take 500 mg by mouth daily as needed (Cold sores).        Olam Dalton, MSN, [facility], [facility] [employer]

## 2024-01-19 NOTE — MAU Note (Signed)
 MAU Triage Note:  .Pamela Booker is a 28 y.o. at [redacted]w[redacted]d here in MAU reporting: intermittent sharp LLQ pain that started around 1800 today. The pain will come about every 20 minutes and will last for 30 seconds. She reports 2-3 episodes of these 30 seconds of pain before it goes away. Denies VB or watery LOF. Reports +FM.   Patient complaint: Shrp Pelvic Pn  Pain Score: 8  Pain Location: Abdomen     Onset of complaint: today LMP: Patient's last menstrual period was 07/27/2023.  Vitals:   01/19/24 2036  BP: 127/73  Pulse: 85  Resp: 18  Temp: 98.4 F (36.9 C)  SpO2: 91%    FHT:  Fetal Heart Rate Mode: Doppler Baseline Rate (A): 156 bpm Lab orders placed from triage: UA

## 2024-01-21 ENCOUNTER — Encounter: Payer: Self-pay | Admitting: Family Medicine

## 2024-01-21 LAB — GC/CHLAMYDIA PROBE AMP (~~LOC~~) NOT AT ARMC
Chlamydia: NEGATIVE
Comment: NEGATIVE
Comment: NORMAL
Neisseria Gonorrhea: NEGATIVE

## 2024-01-30 ENCOUNTER — Ambulatory Visit: Admitting: Family Medicine

## 2024-01-30 ENCOUNTER — Other Ambulatory Visit: Payer: Self-pay

## 2024-01-30 VITALS — BP 116/79 | HR 79 | Wt 180.0 lb

## 2024-01-30 DIAGNOSIS — Z9689 Presence of other specified functional implants: Secondary | ICD-10-CM

## 2024-01-30 DIAGNOSIS — O09293 Supervision of pregnancy with other poor reproductive or obstetric history, third trimester: Secondary | ICD-10-CM

## 2024-01-30 DIAGNOSIS — Z3A28 28 weeks gestation of pregnancy: Secondary | ICD-10-CM | POA: Diagnosis not present

## 2024-01-30 DIAGNOSIS — O09299 Supervision of pregnancy with other poor reproductive or obstetric history, unspecified trimester: Secondary | ICD-10-CM

## 2024-01-30 DIAGNOSIS — O0993 Supervision of high risk pregnancy, unspecified, third trimester: Secondary | ICD-10-CM

## 2024-01-30 DIAGNOSIS — O210 Mild hyperemesis gravidarum: Secondary | ICD-10-CM

## 2024-01-30 DIAGNOSIS — Z9009 Acquired absence of other part of head and neck: Secondary | ICD-10-CM

## 2024-01-30 LAB — POCT URINALYSIS DIP (DEVICE)
Bilirubin Urine: NEGATIVE
Glucose, UA: 100 mg/dL — AB
Hgb urine dipstick: NEGATIVE
Ketones, ur: NEGATIVE mg/dL
Leukocytes,Ua: NEGATIVE
Nitrite: NEGATIVE
Protein, ur: NEGATIVE mg/dL
Specific Gravity, Urine: 1.02 (ref 1.005–1.030)
Urobilinogen, UA: 0.2 mg/dL (ref 0.0–1.0)
pH: 7 (ref 5.0–8.0)

## 2024-01-30 NOTE — Progress Notes (Unsigned)
   PRENATAL VISIT NOTE  Subjective:  Pamela Booker is a 28 y.o. [phone] at [redacted]w[redacted]d being seen today for ongoing prenatal care.  She is currently monitored for the following issues for this high-risk pregnancy and has PVC (premature ventricular contraction); S/P placement of VNS (vagus nerve stimulation) device; Epilepsy (HCC); Migraine; Hx of preeclampsia, prior pregnancy, currently pregnant; Supervision of high-risk pregnancy; Long-term current use of benzodiazepine; Hyperemesis gravidarum; Nonspecific syndrome suggestive of viral illness; Thyroid  nodule; and History of lobectomy of thyroid  on their problem list.  Patient reports no complaints.  Contractions: Irritability. Vag. Bleeding: None.  Movement: Present. Denies leaking of fluid.   Checking Bps at home 130/80s, nothing > 140/90  The following portions of the patient's history were reviewed and updated as appropriate: allergies, current medications, past family history, past medical history, past social history, past surgical history and problem list.   Objective:    Vitals:   01/30/24 1616  BP: 116/79  Pulse: 79  Weight: 180 lb (81.6 kg)    Fetal Status:  Fetal Heart Rate (bpm): 130   Movement: Present    General: Alert, oriented and cooperative. Patient is in no acute distress.  Skin: Skin is warm and dry. No rash noted.   Cardiovascular: Normal heart rate noted  Respiratory: Normal respiratory effort, no problems with respiration noted  Abdomen: Soft, gravid, appropriate for gestational age.  Pain/Pressure: Present     Pelvic: Cervical exam deferred        Extremities: Normal range of motion.  Edema: Mild pitting, slight indentation  Mental Status: Normal mood and affect. Normal behavior. Normal judgment and thought content.   Assessment and Plan:  Pregnancy: [id number] at [redacted]w[redacted]d 1. [redacted] weeks gestation of pregnancy (Primary) - Culture, OB Urine - Glucose Tolerance, 2 Hours w/1 Hour; Future - CBC; Future - HIV  Antibody (routine testing w rflx); Future - RPR; Future - Protein / creatinine ratio, urine  2. Supervision of high risk pregnancy in third trimester Up to date FH appropriate Vigorous movement Will get 28 week labs next visit Concerns: BP monitoring, wondering frequency. Has noted increase slightly to 130/80s  3. Hyperemesis gravidarum Improved with weekly infusions  TWG=22 lb (9.979 kg)   4. S/P placement of VNS (vagus nerve stimulation) device No sz since last visit Husband has magnet to operate device On Trileptal   5. Hx of preeclampsia, prior pregnancy, currently pregnant BP WNL  6. History of lobectomy of thyroid     Preterm labor symptoms and general obstetric precautions including but not limited to vaginal bleeding, contractions, leaking of fluid and fetal movement were reviewed in detail with the patient. Please refer to After Visit Summary for other counseling recommendations.   No follow-ups on file.  Future Appointments  Date Time Provider Department Center  01/31/2024  2:00 PM [facility]IR 1 [facility] None  02/13/2024  9:55 AM Eldonna Suzen Octave, MD [facility]  02/13/2024 11:00 AM [facility] LAB [facility]  02/15/2024  3:00 PM Tobb, Kardie, DO [facility] None  02/27/2024  2:15 PM Eldonna Suzen Octave, MD [facility]  03/07/2024  2:00 PM [facility] PROVIDER 1 [facility]  03/07/2024  2:30 PM [facility] US5 [facility]  03/12/2024  3:15 PM Eldonna Suzen Octave, MD [facility]  03/26/2024  3:35 PM Eldonna Suzen Octave, MD [facility]    Suzen Octave Eldonna, MD

## 2024-01-31 ENCOUNTER — Encounter: Payer: Self-pay | Admitting: Family Medicine

## 2024-01-31 ENCOUNTER — Ambulatory Visit (INDEPENDENT_AMBULATORY_CARE_PROVIDER_SITE_OTHER)

## 2024-01-31 ENCOUNTER — Ambulatory Visit: Payer: Self-pay | Admitting: Family Medicine

## 2024-01-31 VITALS — BP 111/70 | HR 80 | Temp 98.1°F | Resp 18 | Ht 62.0 in | Wt 187.4 lb

## 2024-01-31 DIAGNOSIS — Z3A26 26 weeks gestation of pregnancy: Secondary | ICD-10-CM | POA: Diagnosis not present

## 2024-01-31 DIAGNOSIS — O210 Mild hyperemesis gravidarum: Secondary | ICD-10-CM | POA: Diagnosis not present

## 2024-01-31 LAB — PROTEIN / CREATININE RATIO, URINE
Creatinine, Urine: 47.8 mg/dL
Protein, Ur: 5 mg/dL
Protein/Creat Ratio: 105 mg/g{creat} (ref 0–200)

## 2024-01-31 MED ORDER — THIAMINE HCL 100 MG/ML IJ SOLN
100.0000 mg | INTRAVENOUS | Status: DC
  Administered 2024-01-31: 100 mg via INTRAVENOUS
  Filled 2024-01-31: qty 1

## 2024-01-31 MED ORDER — SODIUM CHLORIDE 0.9 % IV SOLN
Freq: Once | INTRAVENOUS | Status: AC
  Filled 2024-01-31: qty 1000

## 2024-01-31 NOTE — Progress Notes (Signed)
 Diagnosis: Hyperemesis Gravidarum  Provider:  Praveen Mannam MD  Procedure: IV Infusion  IV Type: Peripheral, IV Location: R Antecubital  Banana Bag, Dose: 1000 ml  Infusion Start Time: 1455  Infusion Stop Time: 1712   Thiamine , Dose: 100 mg  Infusion Start Time: 1455  Infusion Stop Time: 1526  Post Infusion IV Care: Peripheral IV Discontinued  Discharge: Condition: Good, Destination: Home . AVS Declined  Performed by:  Lily Velasquez, RN

## 2024-02-01 LAB — CULTURE, OB URINE

## 2024-02-01 LAB — URINE CULTURE, OB REFLEX: Organism ID, Bacteria: NO GROWTH

## 2024-02-05 ENCOUNTER — Telehealth: Admitting: Physician Assistant

## 2024-02-05 DIAGNOSIS — J019 Acute sinusitis, unspecified: Secondary | ICD-10-CM | POA: Diagnosis not present

## 2024-02-05 DIAGNOSIS — B9689 Other specified bacterial agents as the cause of diseases classified elsewhere: Secondary | ICD-10-CM

## 2024-02-05 MED ORDER — AMOXICILLIN 875 MG PO TABS: 875.0000 mg | ORAL_TABLET | Freq: Two times a day (BID) | ORAL | 0 refills | Status: AC

## 2024-02-05 NOTE — Patient Instructions (Signed)
 Pamela Booker, thank you for joining Pamela Velma Lunger, PA-C for today's virtual visit.  While this provider is not your primary care provider (PCP), if your PCP is located in our provider database this encounter information will be shared with them immediately following your visit.   A [facility] MyChart account gives you access to today's visit and all your visits, tests, and labs performed at [facility]  click here if you don't have a [facility] MyChart account or go to [email or web address]  Consent: (Patient) Pamela Booker provided verbal consent for this virtual visit at the beginning of the encounter.  Current Medications:  Current Outpatient Medications:    amoxicillin  (AMOXIL ) 875 MG tablet, Take 1 tablet (875 mg total) by mouth 2 (two) times daily for 7 days., Disp: 14 tablet, Rfl: 0   cyclobenzaprine  (FLEXERIL ) 10 MG tablet, Take 1 tablet (10 mg total) by mouth 2 (two) times daily as needed for muscle spasms., Disp: 20 tablet, Rfl: 0   diazepam  (VALIUM ) 5 MG tablet, Take 5 mg by mouth 2 (two) times daily., Disp: , Rfl:    escitalopram  (LEXAPRO ) 20 MG tablet, Take 20 mg by mouth daily., Disp: , Rfl:    folic acid (FOLVITE) 1 MG tablet, Take 4 mg by mouth daily., Disp: , Rfl:    Heating Pads PADS, 1 Pad by Does not apply route as needed (cramping)., Disp: , Rfl:    ondansetron  (ZOFRAN -ODT) 4 MG disintegrating tablet, Take 1 tablet (4 mg total) by mouth every 6 (six) hours as needed for nausea., Disp: 30 tablet, Rfl: 1   OXcarbazepine  (TRILEPTAL ) 150 MG tablet, Take 4 tablets (600 mg total) by mouth 2 (two) times daily., Disp: , Rfl:    Prenatal Vit-Fe Fumarate-FA (MULTIVITAMIN-PRENATAL) 27-0.8 MG TABS tablet, Take 2 tablets by mouth daily at 12 noon., Disp: , Rfl:    pyridoxine  (B-6) 100 MG tablet, Take 100 mg by mouth daily., Disp: , Rfl:    valACYclovir  (VALTREX ) 500 MG tablet, Take 500 mg by mouth daily as needed (Cold sores)., Disp: , Rfl:     Medications ordered in this encounter:  Meds ordered this encounter  Medications   amoxicillin  (AMOXIL ) 875 MG tablet    Sig: Take 1 tablet (875 mg total) by mouth 2 (two) times daily for 7 days.    Dispense:  14 tablet    Refill:  0    Supervising Provider:   BLAISE ALEENE Booker [[id number]]     *If you need refills on other medications prior to your next appointment, please contact your pharmacy*  Follow-Up: Call back or seek an in-person evaluation if the symptoms worsen or if the condition fails to improve as anticipated.  [facility] [phone]  Other Instructions Please take antibiotic as directed.  Increase fluid intake.  Use Saline nasal spray.  Take a daily multivitamin. Ok to continue OTC Loratadine and plain Mucinex.  Place a humidifier in the bedroom.  Please call or return clinic if symptoms are not improving.  Sinusitis Sinusitis is redness, soreness, and swelling (inflammation) of the paranasal sinuses. Paranasal sinuses are air pockets within the bones of your face (beneath the eyes, the middle of the forehead, or above the eyes). In healthy paranasal sinuses, mucus is able to drain out, and air is able to circulate through them by way of your nose. However, when your paranasal sinuses are inflamed, mucus and air can become trapped. This can allow bacteria and other germs to grow  and cause infection. Sinusitis can develop quickly and last only a short time (acute) or continue over a long period (chronic). Sinusitis that lasts for more than 12 weeks is considered chronic.  CAUSES  Causes of sinusitis include: Allergies. Structural abnormalities, such as displacement of the cartilage that separates your nostrils (deviated septum), which can decrease the air flow through your nose and sinuses and affect sinus drainage. Functional abnormalities, such as when the small hairs (cilia) that line your sinuses and help remove mucus do not work properly or are not  present. SYMPTOMS  Symptoms of acute and chronic sinusitis are the same. The primary symptoms are pain and pressure around the affected sinuses. Other symptoms include: Upper toothache. Earache. Headache. Bad breath. Decreased sense of smell and taste. A cough, which worsens when you are lying flat. Fatigue. Fever. Thick drainage from your nose, which often is green and may contain pus (purulent). Swelling and warmth over the affected sinuses. DIAGNOSIS  Your caregiver will perform a physical exam. During the exam, your caregiver may: Look in your nose for signs of abnormal growths in your nostrils (nasal polyps). Tap over the affected sinus to check for signs of infection. View the inside of your sinuses (endoscopy) with a special imaging device with a light attached (endoscope), which is inserted into your sinuses. If your caregiver suspects that you have chronic sinusitis, one or more of the following tests may be recommended: Allergy tests. Nasal culture A sample of mucus is taken from your nose and sent to a lab and screened for bacteria. Nasal cytology A sample of mucus is taken from your nose and examined by your caregiver to determine if your sinusitis is related to an allergy. TREATMENT  Most cases of acute sinusitis are related to a viral infection and will resolve on their own within 10 days. Sometimes medicines are prescribed to help relieve symptoms (pain medicine, decongestants, nasal steroid sprays, or saline sprays).  However, for sinusitis related to a bacterial infection, your caregiver will prescribe antibiotic medicines. These are medicines that will help kill the bacteria causing the infection.  Rarely, sinusitis is caused by a fungal infection. In theses cases, your caregiver will prescribe antifungal medicine. For some cases of chronic sinusitis, surgery is needed. Generally, these are cases in which sinusitis recurs more than 3 times per year, despite other  treatments. HOME CARE INSTRUCTIONS  Drink plenty of water . Water  helps thin the mucus so your sinuses can drain more easily. Use a humidifier. Inhale steam 3 to 4 times a day (for example, sit in the bathroom with the shower running). Apply a warm, moist washcloth to your face 3 to 4 times a day, or as directed by your caregiver. Use saline nasal sprays to help moisten and clean your sinuses. Take over-the-counter or prescription medicines for pain, discomfort, or fever only as directed by your caregiver. SEEK IMMEDIATE MEDICAL CARE IF: You have increasing pain or severe headaches. You have nausea, vomiting, or drowsiness. You have swelling around your face. You have vision problems. You have a stiff neck. You have difficulty breathing. MAKE SURE YOU:  Understand these instructions. Will watch your condition. Will get help right away if you are not doing well or get worse. Document Released: 05/22/2005 Document Revised: 08/14/2011 Document Reviewed: 06/06/2011 [facility] Patient Information 2014 [place], [place].    If you have been instructed to have an in-person evaluation today at a local Urgent Care facility, please use the link below. It will take you to  a list of all of our available [facility], including address, phone number and hours of operation. Please do not delay care.   Urgent Cares  If you or a family member do not have a primary care provider, use the link below to schedule a visit and establish care. When you choose a [facility] primary care physician or advanced practice provider, you gain a long-term partner in health. Find a Primary Care Provider  Learn more about [facility] in-office and virtual care options:  - Get Care Now

## 2024-02-05 NOTE — Progress Notes (Signed)
 Virtual Visit Consent   Jaclynn Laumann, you are scheduled for a virtual visit with a [facility] provider today. Just as with appointments in the office, your consent must be obtained to participate. Your consent will be active for this visit and any virtual visit you may have with one of our providers in the next 365 days. If you have a MyChart account, a copy of this consent can be sent to you electronically.  As this is a virtual visit, video technology does not allow for your provider to perform a traditional examination. This may limit your provider's ability to fully assess your condition. If your provider identifies any concerns that need to be evaluated in person or the need to arrange testing (such as labs, EKG, etc.), we will make arrangements to do so. Although advances in technology are sophisticated, we cannot ensure that it will always work on either your end or our end. If the connection with a video visit is poor, the visit may have to be switched to a telephone visit. With either a video or telephone visit, we are not always able to ensure that we have a secure connection.  By engaging in this virtual visit, you consent to the provision of healthcare and authorize for your insurance to be billed (if applicable) for the services provided during this visit. Depending on your insurance coverage, you may receive a charge related to this service.  I need to obtain your verbal consent now. Are you willing to proceed with your visit today? Geniva Lohnes has provided verbal consent on 02/05/2024 for a virtual visit (video or telephone). Pamela Booker, [place]  Date: 02/05/2024 4:17 PM   Virtual Visit via Video Note   I, Pamela Booker, connected with  Kammy Klett Nikolski  ([id number], 07-Aug-1995) on 02/05/24 at  4:30 PM EDT by a video-enabled telemedicine application and verified that I am speaking with the correct person using two  identifiers.  Location: Patient: Virtual Visit Location Patient: Home Provider: Virtual Visit Location Provider: Home Office   I discussed the limitations of evaluation and management by telemedicine and the availability of in person appointments. The patient expressed understanding and agreed to proceed.    History of Present Illness: Hugh Garrow is a 28 y.o. who identifies as a female who was assigned female at birth, and is being seen today for possible sinusitis. Endorses symptoms starting over the past several days with nasal congestion and sinus pressure, now with maxillary facial/sinus pain and increased fatigue. Denies fever, chills. No major chest congestion, mild cough. Denies CP or SOB. Is currently [redacted] weeks pregnant.  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  It feels as if I have things crawling on my skin.     Crawling feeling under skin    It feels as if I have things crawling on my skin.   Crawling feeling under skin    Crawling feeling under skin  It feels as if I  have things crawling on my skin.   Ibuprofen Other (See Comments)    Other reaction(s): GI Upset (intolerance)  Other reaction(s): GI Upset (intolerance) Other reaction(s): GI Upset (intolerance)    Other reaction(s): GI Upset (intolerance)    I have stomach ulcers and I get really bad cramping.  Other reaction(s): GI Upset (intolerance)    I have stomach ulcers and I get really bad cramping.    Other reaction(s): GI Upset (intolerance) Other reaction(s): GI Upset (intolerance)    Other reaction(s): GI Upset (intolerance) Other reaction(s): GI Upset (intolerance)  Other reaction(s): GI Upset (intolerance)  I have stomach ulcers and I get really bad cramping.   Nsaids Other (See Comments)    History of bleeding ulcers   Compazine  [Prochlorperazine ]    Keppra  [Levetiracetam ] Other (See Comments)    Makes her feel hungover    Phenergan  Balbinus.Blush ]    Emgality [Galcanezumab-Gnlm] Rash   Lorazepam  Itching   Medications:  Current Outpatient Medications:    amoxicillin  (AMOXIL ) 875 MG tablet, Take 1 tablet (875 mg total) by mouth 2 (two) times daily for 7 days., Disp: 14 tablet, Rfl: 0   cyclobenzaprine  (FLEXERIL ) 10 MG tablet, Take 1 tablet (10 mg total) by mouth 2 (two) times daily as needed for muscle spasms., Disp: 20 tablet, Rfl: 0   diazepam  (VALIUM ) 5 MG tablet, Take 5 mg by mouth 2 (two) times daily., Disp: , Rfl:    escitalopram  (LEXAPRO ) 20 MG tablet, Take 20 mg by mouth daily., Disp: , Rfl:    folic acid (FOLVITE) 1 MG tablet, Take 4 mg by mouth daily., Disp: , Rfl:    Heating Pads PADS, 1 Pad by Does not apply route as needed (cramping)., Disp: , Rfl:    ondansetron  (ZOFRAN -ODT) 4 MG disintegrating tablet, Take 1 tablet (4 mg total) by mouth every 6 (six) hours as needed for nausea., Disp: 30 tablet, Rfl: 1   OXcarbazepine  (TRILEPTAL ) 150 MG tablet, Take 4 tablets (600 mg total) by mouth 2 (two) times daily., Disp: , Rfl:    Prenatal Vit-Fe Fumarate-FA  (MULTIVITAMIN-PRENATAL) 27-0.8 MG TABS tablet, Take 2 tablets by mouth daily at 12 noon., Disp: , Rfl:    pyridoxine  (B-6) 100 MG tablet, Take 100 mg by mouth daily., Disp: , Rfl:    valACYclovir  (VALTREX ) 500 MG tablet, Take 500 mg by mouth daily as needed (Cold sores)., Disp: , Rfl:   Observations/Objective: Patient is well-developed, well-nourished in no acute distress.  Resting comfortably at home.  Head is normocephalic, atraumatic.  No labored breathing. Speech is clear and coherent with logical content.  Patient is alert and oriented at baseline.   Assessment and Plan: 1. Acute bacterial sinusitis (Primary) - amoxicillin  (AMOXIL ) 875 MG tablet; Take 1 tablet (875 mg total) by mouth 2 (two) times daily for 7 days.  Dispense: 14 tablet; Refill: 0  Rx Amoxicillin  since actively pregnant.  Increase fluids.  Rest.  Saline nasal spray.  Probiotic.  Mucinex as directed.  Humidifier in bedroom. OTC medications safe in pregnancy reviewed with patient.  Call or return to clinic if symptoms are not improving.  Follow Up Instructions: I discussed the assessment and treatment plan with the patient. The patient was provided an opportunity to ask questions and all were answered. The patient agreed with the plan and demonstrated an understanding of the instructions.  A copy of instructions were sent to the patient via MyChart unless otherwise noted below.    The patient was advised to call back or seek an in-person evaluation if the symptoms worsen or if the condition fails to improve as anticipated.    Pamela Velma Lunger, PA-C

## 2024-02-07 DIAGNOSIS — Z0289 Encounter for other administrative examinations: Secondary | ICD-10-CM

## 2024-02-10 ENCOUNTER — Inpatient Hospital Stay (HOSPITAL_COMMUNITY)
Admission: AD | Admit: 2024-02-10 | Discharge: 2024-02-10 | Disposition: A | Discharge status: Home / Self Care | Attending: Obstetrics and Gynecology | Admitting: Obstetrics and Gynecology

## 2024-02-10 ENCOUNTER — Encounter (HOSPITAL_COMMUNITY): Payer: Self-pay | Admitting: Obstetrics and Gynecology

## 2024-02-10 ENCOUNTER — Other Ambulatory Visit: Payer: Self-pay

## 2024-02-10 DIAGNOSIS — Z3A28 28 weeks gestation of pregnancy: Secondary | ICD-10-CM

## 2024-02-10 DIAGNOSIS — Z79899 Other long term (current) drug therapy: Secondary | ICD-10-CM

## 2024-02-10 DIAGNOSIS — O99613 Diseases of the digestive system complicating pregnancy, third trimester: Secondary | ICD-10-CM | POA: Diagnosis present

## 2024-02-10 DIAGNOSIS — O212 Late vomiting of pregnancy: Secondary | ICD-10-CM | POA: Insufficient documentation

## 2024-02-10 DIAGNOSIS — O26893 Other specified pregnancy related conditions, third trimester: Secondary | ICD-10-CM | POA: Diagnosis not present

## 2024-02-10 DIAGNOSIS — Z3689 Encounter for other specified antenatal screening: Secondary | ICD-10-CM | POA: Diagnosis not present

## 2024-02-10 DIAGNOSIS — K219 Gastro-esophageal reflux disease without esophagitis: Secondary | ICD-10-CM

## 2024-02-10 DIAGNOSIS — O210 Mild hyperemesis gravidarum: Secondary | ICD-10-CM

## 2024-02-10 LAB — URINALYSIS, ROUTINE W REFLEX MICROSCOPIC
Bilirubin Urine: NEGATIVE
Glucose, UA: 50 mg/dL — AB
Hgb urine dipstick: NEGATIVE
Ketones, ur: NEGATIVE mg/dL
Leukocytes,Ua: NEGATIVE
Nitrite: NEGATIVE
Protein, ur: NEGATIVE mg/dL
Specific Gravity, Urine: 1.008 (ref 1.005–1.030)
pH: 7 (ref 5.0–8.0)

## 2024-02-10 LAB — RUPTURE OF MEMBRANE (ROM)PLUS: Rom Plus: NEGATIVE

## 2024-02-10 MED ORDER — ALUM & MAG HYDROXIDE-SIMETH 200-200-20 MG/5ML PO SUSP
30.0000 mL | Freq: Once | ORAL | Status: AC
  Administered 2024-02-10: 30 mL via ORAL
  Filled 2024-02-10: qty 30

## 2024-02-10 MED ORDER — PANTOPRAZOLE SODIUM 40 MG PO TBEC: 40.0000 mg | DELAYED_RELEASE_TABLET | Freq: Every day | ORAL | 1 refills | Status: DC

## 2024-02-10 MED ORDER — PANTOPRAZOLE SODIUM 40 MG PO TBEC
40.0000 mg | DELAYED_RELEASE_TABLET | Freq: Once | ORAL | Status: AC
  Administered 2024-02-10: 40 mg via ORAL
  Filled 2024-02-10: qty 1

## 2024-02-10 NOTE — MAU Provider Note (Signed)
 History     CSN: [id number]  Arrival date and time: 02/10/24 0217   Event Date/Time   First Provider Initiated Contact with Patient 02/10/24 [phone]      Chief Complaint  Patient presents with   Emesis   Nausea   Pamela Booker is a 28 y.o. [id number] at [redacted]w[redacted]d who receives care at [facility].  She presents today for nausea and vomiting. She reports having vomiting started around 1630, but contributed it to heartburn.  She states she took some tums later with no relief.  She reports burning in her chest  with each incident. She states a few incidents have occurred after laying down. She reports at 0100 she took a dose of Zofran , but had vomiting shortly after. She reports having ~ 10 incidents of vomiting today and that it was dark brown each time. She reports eating mac/cheese earlier and shrimp tacos and brownie tonight. She states she has been having heartburn that has gradually worsened in the past few weeks. She states she has tried mylanta and pepcid  with no relief.   She is not currently on regularly scheduled medications.Patient states she tried to be seen by GI last year around summer, but unable to schedule due to them being booked out.   She also reports during very violent vomiting she would notice increased discharge. She does not think her water  is broken, but is concerned.   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     EAB 11/19.   Started back on continuous nuvaring thereafter  1/20- excessive bleeding. U/s with 3cm EMS. Placed on provera 10mg  bid in addition to nuvaring.  Encourage progesterone  taper. Have full menses at end of 3wks of Nuvaring.   2/20- u/s with resolved thickening of EMS to 4mm  Restart continuous nuvaring   EAB 11/19.   Sta   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/04/2022   Formatting of this note might be different from the original. Pap 3/22- LGSIL. 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     Longstanding. Reports occasional tearing and discomfort with sex.   She would like hormones checked.  She states that her estrogen level was low several years ago after a multiyear course of Depo-Provera.  Labs with low estradiol , FSH, and LH. Normal prolactin & TSH.[name]  Discussed increasing her estrogen level by changing to a higher      Past Surgical History:  Procedure Laterality Date   CYSTOSCOPY W/ URETERAL STENT REMOVAL Left 01/09/2023   Procedure: CYSTOSCOPY WITH STENT REMOVAL;  Surgeon: Twylla Glendia BROCKS, MD;  Location: [facility];  Service: Urology;  Laterality: Left;   CYSTOSCOPY/URETEROSCOPY/HOLMIUM LASER/STENT PLACEMENT Left 01/02/2023   Procedure: CYSTOSCOPY/URETEROSCOPY/HOLMIUM LASER/STENT PLACEMENT;  Surgeon: Twylla Glendia BROCKS, MD;  Location: [facility];  Service: Urology;  Laterality: Left;   CYSTOSCOPY/URETEROSCOPY/HOLMIUM LASER/STENT PLACEMENT Right 01/09/2023   Procedure: CYSTOSCOPY/URETEROSCOPY/HOLMIUM LASER/STENT PLACEMENT;  Surgeon: Twylla Glendia BROCKS, MD;  Location: [facility];  Service: Urology;  Laterality: Right;   DILATION AND EVACUATION N/A 05/17/2023   Procedure: DILATATION AND EVACUATION;  Surgeon: Izell Harari, MD;  Location: [facility];  Service: Gynecology;  Laterality: N/A;   ENDOMETRIAL ABLATION  06/2022   [facility]   LAPAROSCOPY     x5 for endometriosis   LOOP RECORDER INSERTION  2020   LOOP RECORDER REMOVAL  2021   THYROIDECTOMY, PARTIAL     TONSILLECTOMY     URETEROSCOPY Left 01/09/2023   Procedure: URETEROSCOPY;  Surgeon: Twylla Glendia BROCKS, MD;  Location: [facility];  Service: Urology;  Laterality: Left;   VAGUS NERVE STIMULATOR INSERTION  2022    Family History  Adopted: Yes  Problem Relation Age of Onset   Healthy Daughter     Social History   Tobacco Use   Smoking status: Never   Smokeless tobacco: Never  Vaping Use   Vaping status: Former   Substances: Nicotine  Substance Use Topics   Alcohol use: Not Currently    Comment: socially   Drug use: Not Currently    Comment: THC    Allergies:  Allergies  Allergen Reactions   Zolpidem Other (See Comments)    seizure  Other Reaction(s): Other (See Comments)    seizure   Diphenhydramine  Other (See Comments)    Crawling feeling under skin  Crawling feeling under skin    It feels as if I have things crawling on my  skin.  It feels as if I have things crawling on my skin.     Crawling feeling under skin    It feels as if I have things crawling on my skin.   Crawling feeling under skin    Crawling feeling under skin  It feels as if I have things crawling on my skin.   Ibuprofen Other (See Comments)    Other reaction(s): GI Upset (intolerance)  Other reaction(s): GI Upset (intolerance) Other reaction(s): GI Upset (intolerance)    Other reaction(s): GI Upset (intolerance)    I have stomach ulcers and I get  really bad cramping.  Other reaction(s): GI Upset (intolerance)    I have stomach ulcers and I get really bad cramping.    Other reaction(s): GI Upset (intolerance) Other reaction(s): GI Upset (intolerance)    Other reaction(s): GI Upset (intolerance) Other reaction(s): GI Upset (intolerance)  Other reaction(s): GI Upset (intolerance)  I have stomach ulcers and I get really bad cramping.   Nsaids Other (See Comments)    History of bleeding ulcers   Compazine  [Prochlorperazine ]    Keppra  [Levetiracetam ] Other (See Comments)    Makes her feel hungover    Phenergan  [Promethazine ]    Emgality [Galcanezumab-Gnlm] Rash   Lorazepam  Itching    Medications Prior to Admission  Medication Sig Dispense Refill Last Dose/Taking   amoxicillin  (AMOXIL ) 875 MG tablet Take 1 tablet (875 mg total) by mouth 2 (two) times daily for 7 days. 14 tablet 0 02/09/2024   cyclobenzaprine  (FLEXERIL ) 10 MG tablet Take 1 tablet (10 mg total) by mouth 2 (two) times daily as needed for muscle spasms. 20 tablet 0 Past Week   diazepam  (VALIUM ) 5 MG tablet Take 5 mg by mouth 2 (two) times daily.   Past Week   escitalopram  (LEXAPRO ) 20 MG tablet Take 20 mg by mouth daily.   02/09/2024   folic acid (FOLVITE) 1 MG tablet Take 4 mg by mouth daily.   02/09/2024   ondansetron  (ZOFRAN -ODT) 4 MG disintegrating tablet Take 1 tablet (4 mg total) by mouth every 6 (six) hours as needed for nausea. 30 tablet 1 02/10/2024  Morning   OXcarbazepine  (TRILEPTAL ) 150 MG tablet Take 4 tablets (600 mg total) by mouth 2 (two) times daily.   02/09/2024   Prenatal Vit-Fe Fumarate-FA (MULTIVITAMIN-PRENATAL) 27-0.8 MG TABS tablet Take 2 tablets by mouth daily at 12 noon.   02/09/2024   pyridoxine  (B-6) 100 MG tablet Take 100 mg by mouth daily.   02/09/2024   valACYclovir  (VALTREX ) 500 MG tablet Take 500 mg by mouth daily as needed (Cold sores).   Past Week   Heating Pads PADS 1 Pad by Does not apply route as needed (cramping).       Review of Systems  Gastrointestinal:  Positive for nausea (None currently) and vomiting (Last incident at 0100). Negative for abdominal pain.  Genitourinary:  Negative for difficulty urinating, dysuria, vaginal bleeding and vaginal discharge.       Questionable leaking vs urinary incontinence    Physical Exam   Blood pressure 125/72, pulse 77, temperature 98.2 F (36.8 C), temperature source Oral, resp. rate 17, height 5' 2 (1.575 m), weight 87.7 kg, last menstrual period 07/27/2023, SpO2 100%.  Physical Exam Vitals reviewed.  Constitutional:      Appearance: Normal appearance.  HENT:     Head: Normocephalic and atraumatic.  Eyes:     Conjunctiva/sclera: Conjunctivae normal.  Cardiovascular:     Rate and Rhythm: Normal rate.  Pulmonary:     Effort: Pulmonary effort is normal. No respiratory distress.  Chest:     Chest wall: No tenderness.  Abdominal:     Palpations: Abdomen is soft.     Tenderness: There is no abdominal tenderness.     Comments: Gravid  Musculoskeletal:        General: Normal range of motion.     Cervical back: Normal range of motion.  Skin:    General: Skin is warm and dry.     Findings: Erythema (Back of left knee) present.  Neurological:     Mental Status: She is alert  and oriented to person, place, and time.  Psychiatric:        Mood and Affect: Mood normal.        Behavior: Behavior normal.     Fetal Assessment 130 bpm, Mod Var, -Decels,  +Accels Toco: No ctx graphed  MAU Course   Results for orders placed or performed during the hospital encounter of 02/10/24 (from the past 24 hours)  Urinalysis, Routine w reflex microscopic -Urine, Clean Catch     Status: Abnormal   Collection Time: 02/10/24  2:40 AM  Result Value Ref Range   Color, Urine YELLOW YELLOW   APPearance HAZY (A) CLEAR   Specific Gravity, Urine 1.008 1.005 - 1.030   pH 7.0 5.0 - 8.0   Glucose, UA 50 (A) NEGATIVE mg/dL   Hgb urine dipstick NEGATIVE NEGATIVE   Bilirubin Urine NEGATIVE NEGATIVE   Ketones, ur NEGATIVE NEGATIVE mg/dL   Protein, ur NEGATIVE NEGATIVE mg/dL   Nitrite NEGATIVE NEGATIVE   Leukocytes,Ua NEGATIVE NEGATIVE   RBC / HPF 0-5 0 - 5 RBC/hpf   WBC, UA 0-5 0 - 5 WBC/hpf   Bacteria, UA RARE (A) NONE SEEN   Squamous Epithelial / HPF 0-5 0 - 5 /HPF   Mucus PRESENT    No results found.   MDM PE Labs: UA, Rom Plus EFM Antacid PPI Prescription Assessment and Plan   28 year old G5P1031  SIUP at 28.2 weeks Cat I FT GERD-Chronic  -POC Reviewed.  -Exam performed. -Informed that symptoms are c/w GERD.  -Discussed usage of oral medications for treatment.  -Patient reports using Emgality with reaction around injection site. -Discussed administering mylanta f/b protonix .  -If successful plan to send prescription to pharmacy.  -Discussed following up with GI doctor considering h/o ulcers.  -Patient agreeable with plan and referral. -Monitor and reassess. -NST reactive. Okay to discontinue monitoring.   Harlene LITTIE Duncans MSN, CNM 02/10/2024, 3:17 AM   Reassessment (5:10 AM) -Patient reports improvement in symptoms. -Discussed usage at home and plan to start with once daily dosing and increase to  -Reports small rash behind knee on left leg. Exam shows some mild erythema and scaling suggestive of eczema or contact dermatitis. Instructed to monitor.  -ROM plus pending. Await results.   Reassessment (5:27 AM) -Rom plus negative.   -Nurse to give precautions. -Discharged to home in improved condition.  Harlene LITTIE Duncans MSN, CNM Advanced Practice Provider, [facility] [employer]

## 2024-02-10 NOTE — MAU Note (Signed)
 Pamela Booker is a 28 y.o. at [redacted]w[redacted]d here in MAU reporting:    Pt states that this afternoon she started feeling nauseas and started vomiting. Pt states it started around 4:30 pm and then continued to 1130 pm. Pt states she took tylenol  and Tums and it did not help. Pt states in the past 24 hours she has vomited 10 times. Pt reports having dark brown vomit.  Pt also reports that for the past two days, when she lays down she starts feeling light headed and the room goes dark and spinning.   No lof, no bleeding. +FM.    Pain score: abdominal soreness.  Vitals:   02/10/24 0236  BP: 125/72  Pulse: 77  Resp: 17  Temp: 98.2 F (36.8 C)  SpO2: 100%     FHT: 162 Lab orders placed from triage: UA

## 2024-02-13 ENCOUNTER — Other Ambulatory Visit

## 2024-02-13 ENCOUNTER — Ambulatory Visit (INDEPENDENT_AMBULATORY_CARE_PROVIDER_SITE_OTHER): Admitting: Family Medicine

## 2024-02-13 ENCOUNTER — Other Ambulatory Visit: Payer: Self-pay

## 2024-02-13 VITALS — BP 118/73 | HR 106 | Wt 193.0 lb

## 2024-02-13 DIAGNOSIS — O0993 Supervision of high risk pregnancy, unspecified, third trimester: Secondary | ICD-10-CM

## 2024-02-13 DIAGNOSIS — Z9689 Presence of other specified functional implants: Secondary | ICD-10-CM | POA: Diagnosis not present

## 2024-02-13 DIAGNOSIS — G40909 Epilepsy, unspecified, not intractable, without status epilepticus: Secondary | ICD-10-CM | POA: Diagnosis not present

## 2024-02-13 DIAGNOSIS — Z3A28 28 weeks gestation of pregnancy: Secondary | ICD-10-CM

## 2024-02-13 DIAGNOSIS — O210 Mild hyperemesis gravidarum: Secondary | ICD-10-CM

## 2024-02-14 ENCOUNTER — Encounter: Payer: Self-pay | Admitting: Family Medicine

## 2024-02-14 NOTE — Progress Notes (Signed)
   PRENATAL VISIT NOTE  Subjective:  Pamela Booker is a 28 y.o. [phone] at [redacted]w[redacted]d being seen today for ongoing prenatal care.  She is currently monitored for the following issues for this low-risk pregnancy and has PVC (premature ventricular contraction); S/P placement of VNS (vagus nerve stimulation) device; Epilepsy (HCC); Migraine; Hx of preeclampsia, prior pregnancy, currently pregnant; Supervision of high-risk pregnancy; Long-term current use of benzodiazepine; Hyperemesis gravidarum; Nonspecific syndrome suggestive of viral illness; Thyroid  nodule; and History of lobectomy of thyroid  on their problem list.  Patient reports no complaints.  Contractions: Not present. Vag. Bleeding: None.  Movement: Present. Denies leaking of fluid.   The following portions of the patient's history were reviewed and updated as appropriate: allergies, current medications, past family history, past medical history, past social history, past surgical history and problem list.   Objective:    Vitals:   02/13/24 1031  BP: 118/73  Pulse: (!) 106  Weight: 193 lb (87.5 kg)    Fetal Status:  Fetal Heart Rate (bpm): 145 Fundal Height: 28 cm Movement: Present    General: Alert, oriented and cooperative. Patient is in no acute distress.  Skin: Skin is warm and dry. No rash noted.   Cardiovascular: Normal heart rate noted  Respiratory: Normal respiratory effort, no problems with respiration noted  Abdomen: Soft, gravid, appropriate for gestational age.  Pain/Pressure: Present     Pelvic: Cervical exam deferred        Extremities: Normal range of motion.  Edema: Mild pitting, slight indentation  Mental Status: Normal mood and affect. Normal behavior. Normal judgment and thought content.   Assessment and Plan:  Pregnancy: G5P1031 at [redacted]w[redacted]d 1. [redacted] weeks gestation of pregnancy (Primary) - Glucose tolerance, 1 hour - CBC - HIV Antibody (routine testing w rflx) - RPR  2. Nonintractable epilepsy without  status epilepticus, unspecified epilepsy type (HCC) Has Vagal stimulator No seizures recently Does seem to have vagal events when laying back   3. Supervision of high risk pregnancy in third trimester Up to date Vigorous movement FH appropriate No concerns except the events per above with vasovagal sx.   4. S/P placement of VNS (vagus nerve stimulation) device  5. Hyperemesis gravidarum Improved  On infusions   Preterm labor symptoms and general obstetric precautions including but not limited to vaginal bleeding, contractions, leaking of fluid and fetal movement were reviewed in detail with the patient. Please refer to After Visit Summary for other counseling recommendations.   Return in about 2 weeks (around 02/27/2024) for Routine prenatal care.  Future Appointments  Date Time Provider Department Center  02/27/2024  2:15 PM Eldonna Suzen Octave, MD [facility]  03/07/2024  2:00 PM [facility] PROVIDER 1 [facility]  03/07/2024  2:30 PM [facility] US5 [facility]  03/12/2024  3:15 PM Eldonna Suzen Octave, MD [facility]  03/26/2024  3:35 PM Eldonna Suzen Octave, MD [facility]  03/28/2024  4:20 PM Sheena Pugh, DO [facility] None    Suzen Octave Eldonna, MD

## 2024-02-15 ENCOUNTER — Ambulatory Visit: Admitting: Cardiology

## 2024-02-17 LAB — CBC
Hematocrit: 34.7 % (ref 34.0–46.6)
Hemoglobin: 11.5 g/dL (ref 11.1–15.9)
MCH: 33 pg (ref 26.6–33.0)
MCHC: 33.1 g/dL (ref 31.5–35.7)
MCV: 99 fL — ABNORMAL HIGH (ref 79–97)
Platelets: 253 x10E3/uL (ref 150–450)
RBC: 3.49 x10E6/uL — ABNORMAL LOW (ref 3.77–5.28)
RDW: 11.9 % (ref 11.7–15.4)
WBC: 10.2 x10E3/uL (ref 3.4–10.8)

## 2024-02-17 LAB — HIV ANTIBODY (ROUTINE TESTING W REFLEX): HIV Screen 4th Generation wRfx: NONREACTIVE

## 2024-02-17 LAB — GLUCOSE TOLERANCE, 1 HOUR: Glucose, 1Hr PP: 112 mg/dL (ref 70–199)

## 2024-02-17 LAB — RPR: RPR Ser Ql: NONREACTIVE

## 2024-02-18 ENCOUNTER — Ambulatory Visit: Payer: Self-pay | Admitting: Family Medicine

## 2024-02-18 DIAGNOSIS — O0993 Supervision of high risk pregnancy, unspecified, third trimester: Secondary | ICD-10-CM

## 2024-02-25 ENCOUNTER — Encounter: Payer: Self-pay | Admitting: Family Medicine

## 2024-02-25 ENCOUNTER — Ambulatory Visit
Admission: EM | Admit: 2024-02-25 | Discharge: 2024-02-25 | Disposition: A | Discharge status: Home / Self Care | Attending: Physician Assistant | Admitting: Physician Assistant

## 2024-02-25 ENCOUNTER — Encounter: Payer: Self-pay | Admitting: Emergency Medicine

## 2024-02-25 DIAGNOSIS — R350 Frequency of micturition: Secondary | ICD-10-CM | POA: Insufficient documentation

## 2024-02-25 DIAGNOSIS — R81 Glycosuria: Secondary | ICD-10-CM | POA: Diagnosis not present

## 2024-02-25 DIAGNOSIS — B9689 Other specified bacterial agents as the cause of diseases classified elsewhere: Secondary | ICD-10-CM | POA: Diagnosis present

## 2024-02-25 DIAGNOSIS — R102 Pelvic and perineal pain: Secondary | ICD-10-CM | POA: Diagnosis present

## 2024-02-25 DIAGNOSIS — O26893 Other specified pregnancy related conditions, third trimester: Secondary | ICD-10-CM | POA: Diagnosis present

## 2024-02-25 DIAGNOSIS — B349 Viral infection, unspecified: Secondary | ICD-10-CM | POA: Diagnosis present

## 2024-02-25 DIAGNOSIS — N760 Acute vaginitis: Secondary | ICD-10-CM | POA: Diagnosis present

## 2024-02-25 DIAGNOSIS — R509 Fever, unspecified: Secondary | ICD-10-CM | POA: Diagnosis present

## 2024-02-25 DIAGNOSIS — R051 Acute cough: Secondary | ICD-10-CM | POA: Diagnosis not present

## 2024-02-25 DIAGNOSIS — Z3A30 30 weeks gestation of pregnancy: Secondary | ICD-10-CM

## 2024-02-25 LAB — URINALYSIS, W/ REFLEX TO CULTURE (INFECTION SUSPECTED)
Bilirubin Urine: NEGATIVE
Glucose, UA: >=500 mg/dL — AB
Hgb urine dipstick: NEGATIVE
Ketones, ur: NEGATIVE mg/dL
Leukocytes,Ua: NEGATIVE
Nitrite: NEGATIVE
Protein, ur: NEGATIVE mg/dL
Specific Gravity, Urine: 1.015 (ref 1.005–1.030)
pH: 6.5 (ref 5.0–8.0)

## 2024-02-25 LAB — GLUCOSE, CAPILLARY: Glucose-Capillary: 119 mg/dL — ABNORMAL HIGH (ref 70–99)

## 2024-02-25 LAB — RESP PANEL BY RT-PCR (RSV, FLU A&B, COVID)  RVPGX2
Influenza A by PCR: NEGATIVE
Influenza B by PCR: NEGATIVE
Resp Syncytial Virus by PCR: NEGATIVE
SARS Coronavirus 2 by RT PCR: NEGATIVE

## 2024-02-25 LAB — GROUP A STREP BY PCR: Group A Strep by PCR: NOT DETECTED

## 2024-02-25 MED ORDER — METRONIDAZOLE 500 MG PO TABS: 500.0000 mg | ORAL_TABLET | Freq: Two times a day (BID) | ORAL | 0 refills | Status: AC

## 2024-02-25 NOTE — ED Provider Notes (Signed)
 [facility]    CSN: [id number] Arrival date & time: 02/25/24  1542      History   Chief Complaint Chief Complaint  Patient presents with   Generalized Body Aches   Cough   Nasal Congestion   Headache   Fever    HPI Pamela Booker is a 28 y.o. female who is currently [redacted] weeks pregnant. She presents with her partner today for low grade fever (up to 99.7 degrees), fatigue, body aches, cough, congestion, sore throat, and headaches that began yesterday. Also reports pelvic cramping and urinary frequency.  Patient says pelvic cramping is mild to moderate and has not gotten worse.  Denies any vaginal bleeding, nausea/vomiting, flank pain.  Patient says she can still feel her baby kicking the baby is kicking on the left side of her belly which is now causing pain.  She says she thinks she pulled a muscle in her abdomen a few days ago but does not recall fall or injury.  Patient also reports having a seizure that lasted for about a minute last week.  She has these periodically and follows up with OB/GYN and neurology.  She has an appointment coming up with OB/GYN in 2 couple days.  Medical history is significant for anxiety, migraines, syncope, hypertension, epilepsy, preeclampsia, endometriosis, GERD, and chronic pain syndrome.   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     EAB 11/19.   Started back on continuous nuvaring thereafter  1/20- excessive bleeding. U/s with 3cm EMS. Placed on provera 10mg  bid in addition to nuvaring.  Encourage progesterone  taper. Have full menses at end of 3wks of Nuvaring.   2/20- u/s with resolved thickening of EMS to 4mm  Restart continuous nuvaring   EAB 11/19.   Sta   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/04/2022   Formatting of this note might be different from the original. Pap 3/22- LGSIL. Colpo 4/22- CIN 1 Pap 10/23- negative     Migraine headache 07/05/2021   Last Assessment & Plan:     As needed Fioricet  for now     Migraine without aura and responsive to treatment 12/11/2014   Migraines    Nausea & vomiting 11/23/2022   Ovarian cyst    Palpitations    Partial epilepsy with impairment of consciousness (HCC) 06/06/2022   PVC (premature ventricular contraction) 03/11/2018   Seizures (HCC)    last seizure was 2 weeks ago (app.12-18-22)   Shortness of breath 04/25/2016   Status post placement of VNS (vagus nerve stimulation) device    Syncope and collapse 06/10/2019   TB lung, latent 06/02/2023   05/2023: patient states she didn't finish her 9 months of treatment     Thyroid  nodule    Vaginal dryness 06/06/2018  Formatting of this note might be different from the original.     Longstanding. Reports occasional tearing and discomfort with sex.   She would like hormones checked.  She states that her estrogen level was low several years ago after a multiyear course of Depo-Provera.  Labs with low estradiol , FSH, and LH. Normal prolactin & TSH.[name]  Discussed increasing her estrogen level by changing to a higher     Patient Active Problem List   Diagnosis Date Noted   History of lobectomy of thyroid  01/02/2024   Thyroid  nodule 12/04/2023   Nonspecific syndrome suggestive of viral illness 11/16/2023   Hyperemesis gravidarum 10/11/2023    Long-term current use of benzodiazepine 10/08/2023   Supervision of high-risk pregnancy 04/27/2023   Epilepsy (HCC) 07/05/2021   Migraine 07/05/2021   Hx of preeclampsia, prior pregnancy, currently pregnant 07/05/2021   S/P placement of VNS (vagus nerve stimulation) device 05/06/2019   PVC (premature ventricular contraction) 03/11/2018    Past Surgical History:  Procedure Laterality Date   CYSTOSCOPY W/ URETERAL STENT REMOVAL Left 01/09/2023   Procedure: CYSTOSCOPY WITH STENT REMOVAL;  Surgeon: Twylla Glendia BROCKS, MD;  Location: [facility];  Service: Urology;  Laterality: Left;   CYSTOSCOPY/URETEROSCOPY/HOLMIUM LASER/STENT PLACEMENT Left 01/02/2023   Procedure: CYSTOSCOPY/URETEROSCOPY/HOLMIUM LASER/STENT PLACEMENT;  Surgeon: Twylla Glendia BROCKS, MD;  Location: [facility];  Service: Urology;  Laterality: Left;   CYSTOSCOPY/URETEROSCOPY/HOLMIUM LASER/STENT PLACEMENT Right 01/09/2023   Procedure: CYSTOSCOPY/URETEROSCOPY/HOLMIUM LASER/STENT PLACEMENT;  Surgeon: Twylla Glendia BROCKS, MD;  Location: [facility];  Service: Urology;  Laterality: Right;   DILATION AND EVACUATION N/A 05/17/2023   Procedure: DILATATION AND EVACUATION;  Surgeon: Izell Harari, MD;  Location: [facility];  Service: Gynecology;  Laterality: N/A;   ENDOMETRIAL ABLATION  06/2022   [facility]   LAPAROSCOPY     x5 for endometriosis   LOOP RECORDER INSERTION  2020   LOOP RECORDER REMOVAL  2021   THYROIDECTOMY, PARTIAL     TONSILLECTOMY     URETEROSCOPY Left 01/09/2023   Procedure: URETEROSCOPY;  Surgeon: Twylla Glendia BROCKS, MD;  Location: [facility];  Service: Urology;  Laterality: Left;   VAGUS NERVE STIMULATOR INSERTION  2022    OB History     Gravida  5   Para  1   Term  1   Preterm  0   AB  3   Living  1      SAB  3   IAB  0   Ectopic  0   Multiple  0   Live Births  1            Home Medications    Prior to Admission medications   Medication Sig Start Date End Date Taking? Authorizing Provider   metroNIDAZOLE  (FLAGYL ) 500 MG tablet Take 1 tablet (500 mg total) by mouth 2 (two) times daily for 7 days. 02/25/24 03/03/24 Yes Arvis Jolan NOVAK, PA-C  cyclobenzaprine  (FLEXERIL ) 10 MG tablet Take 1 tablet (10 mg total) by mouth 2 (two) times daily as needed for muscle spasms. 01/19/24   Cooleen, Olam LABOR, NP  diazepam  (VALIUM ) 5 MG tablet Take 5 mg by mouth 2 (two) times daily.    [provider]  escitalopram  (LEXAPRO ) 20 MG tablet Take 20 mg by mouth daily.    [provider]  folic acid (FOLVITE) 1 MG tablet Take 4 mg by mouth daily.    [provider]  Heating Pads PADS 1 Pad by Does not apply route as needed (cramping). 05/17/23   Pickens,  Bebe, MD  ondansetron  (ZOFRAN -ODT) 4 MG disintegrating tablet Take 1 tablet (4 mg total) by mouth every 6 (six) hours as needed for nausea. 10/09/23   Wouk, Devaughn Sayres, MD  OXcarbazepine  (TRILEPTAL ) 150 MG tablet Take 4 tablets (600 mg total) by mouth 2 (two) times daily. 09/14/23   Vannie Cornell SAUNDERS, CNM  pantoprazole  (PROTONIX ) 40 MG tablet Take 1 tablet (40 mg total) by mouth daily. 02/10/24   Synthia Raisin, CNM  Prenatal Vit-Fe Fumarate-FA (MULTIVITAMIN-PRENATAL) 27-0.8 MG TABS tablet Take 2 tablets by mouth daily at 12 noon.    [provider]  pyridoxine  (B-6) 100 MG tablet Take 100 mg by mouth daily.    [provider]  valACYclovir  (VALTREX ) 500 MG tablet Take 500 mg by mouth daily as needed (Cold sores).    [provider]    Family History Family History  Adopted: Yes  Problem Relation Age of Onset   Healthy Daughter     Social History Social History   Tobacco Use   Smoking status: Never   Smokeless tobacco: Never  Vaping Use   Vaping status: Former   Substances: Nicotine  Substance Use Topics   Alcohol use: Not Currently    Comment: socially   Drug use: Not Currently    Comment: THC     Allergies   Zolpidem, Diphenhydramine , Ibuprofen, Nsaids, Compazine  [prochlorperazine ],  Keppra  [levetiracetam ], Phenergan  [promethazine ], Emgality [galcanezumab-gnlm], and Lorazepam    Review of Systems Review of Systems  Constitutional:  Positive for fatigue and fever (99.7 degrees). Negative for chills and diaphoresis.  HENT:  Positive for congestion, rhinorrhea and sore throat. Negative for ear pain, sinus pressure and sinus pain.   Respiratory:  Positive for cough. Negative for shortness of breath.   Cardiovascular:  Negative for chest pain.  Gastrointestinal:  Positive for abdominal pain. Negative for nausea and vomiting.  Genitourinary:  Positive for frequency and pelvic pain. Negative for difficulty urinating, dysuria and vaginal bleeding.  Musculoskeletal:  Positive for myalgias.  Skin:  Negative for rash.  Neurological:  Positive for headaches. Negative for dizziness, syncope and weakness.  Hematological:  Negative for adenopathy.     Physical Exam Triage Vital Signs ED Triage Vitals  Encounter Vitals Group     BP 02/25/24 1557 125/81     Girls Systolic BP Percentile --      Girls Diastolic BP Percentile --      Boys Systolic BP Percentile --      Boys Diastolic BP Percentile --      Pulse Rate 02/25/24 1557 (!) 104     Resp 02/25/24 1557 18     Temp 02/25/24 1557 98.4 F (36.9 C)     Temp Source 02/25/24 1557 Oral     SpO2 02/25/24 1557 96 %     Weight --      Height --      Head Circumference --      Peak Flow --      Pain Score 02/25/24 1555 6     Pain Loc --      Pain Education --      Exclude from Growth Chart --    No data found.  Updated Vital Signs BP 125/81 (BP Location: Right Arm)   Pulse (!) 104   Temp 98.4 F (36.9 C) (Oral)   Resp 18   LMP 07/27/2023   SpO2 96%     Physical Exam Vitals and nursing note reviewed.  Constitutional:      General:  She is not in acute distress.    Appearance: Normal appearance. She is not ill-appearing or toxic-appearing.     Comments: Visibly pregnant female  HENT:     Head: Normocephalic and  atraumatic.     Nose: Congestion present.     Mouth/Throat:     Mouth: Mucous membranes are moist.     Pharynx: Oropharynx is clear. Posterior oropharyngeal erythema present.  Eyes:     General: No scleral icterus.       Right eye: No discharge.        Left eye: No discharge.     Conjunctiva/sclera: Conjunctivae normal.  Cardiovascular:     Rate and Rhythm: Regular rhythm. Tachycardia present.     Heart sounds: Normal heart sounds.  Pulmonary:     Effort: Pulmonary effort is normal. No respiratory distress.     Breath sounds: Normal breath sounds.  Abdominal:     Palpations: Abdomen is soft.     Tenderness: There is abdominal tenderness in the right lower quadrant, suprapubic area and left lower quadrant.  Musculoskeletal:     Cervical back: Neck supple.     Right lower leg: Edema present.     Left lower leg: Edema present.     Comments: Mild dorsal pedal edema bilaterally   Skin:    General: Skin is dry.  Neurological:     General: No focal deficit present.     Mental Status: She is alert. Mental status is at baseline.     Motor: No weakness.     Gait: Gait normal.  Psychiatric:        Mood and Affect: Mood normal.        Behavior: Behavior normal.      UC Treatments / Results  Labs (all labs ordered are listed, but only abnormal results are displayed) Labs Reviewed  URINALYSIS, W/ REFLEX TO CULTURE (INFECTION SUSPECTED) - Abnormal; Notable for the following components:      Result Value   Glucose, UA >=500 (*)    Bacteria, UA MANY (*)    All other components within normal limits  GLUCOSE, CAPILLARY - Abnormal; Notable for the following components:   Glucose-Capillary 119 (*)    All other components within normal limits  RESP PANEL BY RT-PCR (RSV, FLU A&B, COVID)  [id number]  GROUP A STREP BY PCR  URINE CULTURE  CBG MONITORING, ED  CERVICOVAGINAL ANCILLARY ONLY    EKG   Radiology No results found.  Procedures Procedures (including critical care  time)  Medications Ordered in UC Medications - No data to display  Initial Impression / Assessment and Plan / UC Course  I have reviewed the triage vital signs and the nursing notes.  Pertinent labs & imaging results that were available during my care of the patient were reviewed by me and considered in my medical decision making (see chart for details).   28 y/o female, currently [redacted] weeks pregnant, with history of anxiety, migraines, syncope, hypertension, epilepsy, preeclampsia, endometriosis, GERD, and chronic pain syndrome presents for fever, fatigue, body aches, headaches, cough and congestion that began yesterday. Has been taking tylenol .   Respiratory panel and strep test are all negative.  Urinalysis shows greater than 500 glucose and many bacteria.  Will send urine for culture and if positive for bacteria we will treat with antibiotics as patient is pregnant.  Patient believes her symptoms could be related to BV because she has a history of frequent BV and would like to be treated  for that.  I did send metronidazole  to pharmacy.  Patient perform self swab for BV and yeast.  Fingerstick performed due to glucosuria.  Fingerstick 119.  Patient also recently passed a glucose tolerance test.  Presentation consistent with viral illness and possible BV.  Encourage patient to take Tylenol , rest and hydrate well.  Patient also has Zofran  at home as needed.  Discussed taking them as well.  Patient to avoid decongestants as that can lower seizure threshold and she reports having a seizure last week.  Discussed that if she has another seizure she needs to go immediately to the ER and she should inform OB/GYN when she sees them in 2 days about the seizure.  We also discussed going to the emergency department if she has worsening pelvic pain/abdominal cramping, vaginal bleeding, fever, excetra.  Patient declined to go to ED tonight.  She understands the risks of not going and will closely monitor  herself.  Her partner will also monitor her and if symptoms acutely worsen will take to ED.   Final Clinical Impressions(s) / UC Diagnoses   Final diagnoses:  Glucosuria  Viral illness  Acute cough  Subjective fever  Bacterial vaginosis  Urinary frequency  Pelvic pain affecting pregnancy in third trimester, antepartum     Discharge Instructions      - Negative COVID, flu, RSV and strep. - Urine not consistent with a UTI but I am sending it for culture and we also had you swab for BV and yeast.  I sent metronidazole  to the pharmacy because its most likely that you have BV given your history.  However we may amend treatment based on the urine culture and vaginal swab. - In regards to the abdominal pain/pelvic cramping if this worsens or you develop bleeding, fever, vomiting please go immediately to the ER.  Otherwise keep your appointment with OB/GYN in 2 days. - Mentioning your appointment that you had a seizure last week and if you have another seizure go immediately to the ER.     ED Prescriptions     Medication Sig Dispense Auth. Provider   metroNIDAZOLE  (FLAGYL ) 500 MG tablet Take 1 tablet (500 mg total) by mouth 2 (two) times daily for 7 days. 14 tablet Jamarco Zaldivar B, PA-C      PDMP not reviewed this encounter.   Arvis Jolan NOVAK, PA-C 02/25/24 1749

## 2024-02-25 NOTE — ED Triage Notes (Signed)
 Pt is [redacted] weeks pregnant and she presents with a low grade fever, bodyaches, cough and headache since yesterday. Pt has taken tylenol  for symptoms. Pt reports a history of seizures with her last one being 1 week ago.

## 2024-02-25 NOTE — Discharge Instructions (Addendum)
-   Negative COVID, flu, RSV and strep. - Urine not consistent with a UTI but I am sending it for culture and we also had you swab for BV and yeast.  I sent metronidazole  to the pharmacy because its most likely that you have BV given your history.  However we may amend treatment based on the urine culture and vaginal swab. - In regards to the abdominal pain/pelvic cramping if this worsens or you develop bleeding, fever, vomiting please go immediately to the ER.  Otherwise keep your appointment with OB/GYN in 2 days. - Mentioning your appointment that you had a seizure last week and if you have another seizure go immediately to the ER.

## 2024-02-26 ENCOUNTER — Ambulatory Visit: Payer: Self-pay | Admitting: Physician Assistant

## 2024-02-26 LAB — CERVICOVAGINAL ANCILLARY ONLY
Bacterial Vaginitis (gardnerella): NEGATIVE
Candida Glabrata: NEGATIVE
Candida Vaginitis: NEGATIVE
Comment: NEGATIVE
Comment: NEGATIVE
Comment: NEGATIVE

## 2024-02-27 ENCOUNTER — Ambulatory Visit (INDEPENDENT_AMBULATORY_CARE_PROVIDER_SITE_OTHER): Admitting: Family Medicine

## 2024-02-27 ENCOUNTER — Other Ambulatory Visit: Payer: Self-pay

## 2024-02-27 VITALS — BP 97/68 | HR 73 | Wt 199.6 lb

## 2024-02-27 DIAGNOSIS — Z23 Encounter for immunization: Secondary | ICD-10-CM

## 2024-02-27 DIAGNOSIS — O0993 Supervision of high risk pregnancy, unspecified, third trimester: Secondary | ICD-10-CM | POA: Diagnosis not present

## 2024-02-27 DIAGNOSIS — Z3A30 30 weeks gestation of pregnancy: Secondary | ICD-10-CM

## 2024-02-27 DIAGNOSIS — O09299 Supervision of pregnancy with other poor reproductive or obstetric history, unspecified trimester: Secondary | ICD-10-CM

## 2024-02-27 DIAGNOSIS — O09293 Supervision of pregnancy with other poor reproductive or obstetric history, third trimester: Secondary | ICD-10-CM

## 2024-02-27 DIAGNOSIS — K2100 Gastro-esophageal reflux disease with esophagitis, without bleeding: Secondary | ICD-10-CM

## 2024-02-27 DIAGNOSIS — Z9689 Presence of other specified functional implants: Secondary | ICD-10-CM | POA: Diagnosis not present

## 2024-02-27 DIAGNOSIS — Z9009 Acquired absence of other part of head and neck: Secondary | ICD-10-CM

## 2024-02-27 DIAGNOSIS — G40909 Epilepsy, unspecified, not intractable, without status epilepticus: Secondary | ICD-10-CM

## 2024-02-27 DIAGNOSIS — G5602 Carpal tunnel syndrome, left upper limb: Secondary | ICD-10-CM | POA: Diagnosis not present

## 2024-02-27 DIAGNOSIS — L299 Pruritus, unspecified: Secondary | ICD-10-CM

## 2024-02-27 LAB — URINE CULTURE

## 2024-02-27 MED ORDER — PANTOPRAZOLE SODIUM 40 MG PO TBEC: 40.0000 mg | DELAYED_RELEASE_TABLET | Freq: Two times a day (BID) | ORAL | 3 refills | Status: AC

## 2024-02-27 MED ORDER — SUCRALFATE 1 GM/10ML PO SUSP: 1.0000 g | Freq: Every day | ORAL | 0 refills | Status: DC

## 2024-02-27 NOTE — Progress Notes (Signed)
 PRENATAL VISIT NOTE  Subjective:  Pamela Booker is a 28 y.o. [phone] at [redacted]w[redacted]d being seen today for ongoing prenatal care.  She is currently monitored for the following issues for this high-risk pregnancy and has PVC (premature ventricular contraction); S/P placement of VNS (vagus nerve stimulation) device; Epilepsy (HCC); Migraine; Hx of preeclampsia, prior pregnancy, currently pregnant; Supervision of high-risk pregnancy; Long-term current use of benzodiazepine; Hyperemesis gravidarum; Nonspecific syndrome suggestive of viral illness; Thyroid  nodule; and History of lobectomy of thyroid  on their problem list.  Patient reports pelvic pressure, muscle strain with cough, painful bh contractions, wrist pain, itchy hands/feet/thighs/arms.  Contractions: Irritability. Vag. Bleeding: None.  Movement: Present. Denies leaking of fluid.   The following portions of the patient's history were reviewed and updated as appropriate: allergies, current medications, past family history, past medical history, past social history, past surgical history and problem list.   Objective:    Vitals:   02/27/24 1422  BP: 97/68  Pulse: 73  Weight: 199 lb 9.6 oz (90.5 kg)    Fetal Status:  Fetal Heart Rate (bpm): 158   Movement: Present    General: Alert, oriented and cooperative. Patient is in no acute distress.  Skin: Skin is warm and dry. No rash noted.   Cardiovascular: Normal heart rate noted  Respiratory: Normal respiratory effort, no problems with respiration noted  Abdomen: Soft, gravid, appropriate for gestational age.  Pain/Pressure: Present     Pelvic: Cervical exam deferred        Extremities: Normal range of motion.  Edema: Trace  Mental Status: Normal mood and affect. Normal behavior. Normal judgment and thought content.   Immunization History  Administered Date(s) Administered   HPV 9-valent 09/21/2020   Hepatitis A, Adult 04/10/2019   Influenza Inj Mdck Quad Pf 04/03/2019    Influenza, Seasonal, Injecte, Preservative Fre 02/27/2024   Influenza,inj,Quad PF,6+ Mos 03/11/2018   Influenza-Unspecified 04/03/2019   Meningococcal B Recombinant 04/10/2019, 10/07/2019   Tdap 04/10/2019, 02/27/2024     Carpal Tunnel Injection Procedure note:  Patient diagnosed with carpal tunnel syndrome during pregnancy. Has tried splints without relief of symptoms. Current symptoms are left wrist/hands.  Discussed role of steroid injection to help alleviate symptoms as conservative measures have not provided relief. Reviewed risks/benefits of procedure. Reviewed complications of procedure which include but are not limited to bleeding, elevated blood glucose levels, infection, median nerve injury, pain, and paresthesias  Patient instructed to inform physician of any tingling or loss of sensation in hand.   After obtaining informed verbal and written consent for carpal tunnel injection, proper site was identified on the left wrist one cm medial to the palmaris longus tendon and one cm proximal to the distal palmar crease. The area was cleaned with alcohol and then anesthetized with hurricane spray. A mixture of 2 cc 10 mg/mL Kenalog  (total of 20 mg) and 1 cc of 1% lidocaine  was injected by directing the needle towards the base of the thumb at a 45 degree angle at a depth of approximately one centimeter. Mixture flowed easily and patient reported no paresthesias during the procedure. A bandaid was then applied and patient instructed to mobilize wrist for remainder of day to help solution spread. Patient tolerated the procedure well.  CPT Code: 79473, left modifier (LT)  Assessment and Plan:  Pregnancy: [id number] at [redacted]w[redacted]d 1. Supervision of high risk pregnancy in third trimester (Primary) UTD FH appropriate Concerns today - BH contractions/pelvic pressure - yesterday was worse improved now.  - Muscle strain  with coughing (GERD)- new rx for PPI and carafate  - Wrist pain- tried braces at  night, worse left. Radiates to elbow. Numbness in finger tips, esp thumb and first 3 digits. Desire carpal tunnel injection today of left wrist - itchy hands/feet arms/thighs- CMP, Bile acids  2. S/P placement of VNS (vagus nerve stimulation) device SZ last Sunday, lasted 1 minute. Broke with VNS magnet Had prodromal sx prior to event No injury  3. Hx of preeclampsia, prior pregnancy, currently pregnant BP WNL  4. History of lobectomy of thyroid   5. Nonintractable epilepsy without status epilepticus, unspecified epilepsy type (HCC) VNS in place On antiepileptics  Preterm labor symptoms and general obstetric precautions including but not limited to vaginal bleeding, contractions, leaking of fluid and fetal movement were reviewed in detail with the patient. Please refer to After Visit Summary for other counseling recommendations.   Return in about 2 weeks (around 03/12/2024) for Routine prenatal care.  Future Appointments  Date Time Provider Department Center  03/12/2024  3:15 PM Eldonna Suzen Octave, MD [facility]  03/17/2024  3:15 PM [facility] PROVIDER 1 [facility]  03/17/2024  3:30 PM [facility] US4 [facility]  03/26/2024  3:35 PM Eldonna Suzen Octave, MD [facility]  03/28/2024  4:20 PM Sheena Pugh, DO [facility] None  04/09/2024  1:35 PM Eldonna Suzen Octave, MD [facility]    Suzen Octave Eldonna, MD

## 2024-02-28 ENCOUNTER — Encounter: Payer: Self-pay | Admitting: Family Medicine

## 2024-02-28 LAB — HEPATIC FUNCTION PANEL
ALT: 28 IU/L (ref 0–32)
AST: 27 IU/L (ref 0–40)
Albumin: 3.6 g/dL — ABNORMAL LOW (ref 4.0–5.0)
Alkaline Phosphatase: 191 IU/L — ABNORMAL HIGH (ref 41–116)
Bilirubin Total: 0.3 mg/dL (ref 0.0–1.2)
Bilirubin, Direct: 0.13 mg/dL (ref 0.00–0.40)
Total Protein: 6.3 g/dL (ref 6.0–8.5)

## 2024-02-28 LAB — BILE ACIDS, TOTAL: Bile Acids Total: 14.1 umol/L (ref 0.0–10.0)

## 2024-02-29 ENCOUNTER — Telehealth: Payer: Self-pay

## 2024-02-29 ENCOUNTER — Inpatient Hospital Stay (HOSPITAL_COMMUNITY)
Admission: AD | Admit: 2024-02-29 | Discharge: 2024-02-29 | Disposition: A | Discharge status: Home / Self Care | Attending: Obstetrics & Gynecology | Admitting: Obstetrics & Gynecology

## 2024-02-29 ENCOUNTER — Ambulatory Visit: Payer: Self-pay | Admitting: Family Medicine

## 2024-02-29 ENCOUNTER — Encounter (HOSPITAL_COMMUNITY): Payer: Self-pay | Admitting: Obstetrics and Gynecology

## 2024-02-29 ENCOUNTER — Encounter: Payer: Self-pay | Admitting: Family Medicine

## 2024-02-29 DIAGNOSIS — O26643 Intrahepatic cholestasis of pregnancy, third trimester: Secondary | ICD-10-CM | POA: Insufficient documentation

## 2024-02-29 DIAGNOSIS — O368130 Decreased fetal movements, third trimester, not applicable or unspecified: Secondary | ICD-10-CM | POA: Diagnosis present

## 2024-02-29 DIAGNOSIS — Z3A31 31 weeks gestation of pregnancy: Secondary | ICD-10-CM

## 2024-02-29 DIAGNOSIS — O210 Mild hyperemesis gravidarum: Secondary | ICD-10-CM | POA: Insufficient documentation

## 2024-02-29 DIAGNOSIS — Z79899 Other long term (current) drug therapy: Secondary | ICD-10-CM | POA: Diagnosis not present

## 2024-02-29 HISTORY — DX: Intrahepatic cholestasis of pregnancy, third trimester: O26.643

## 2024-02-29 LAB — CULTURE, OB URINE

## 2024-02-29 LAB — URINE CULTURE, OB REFLEX

## 2024-02-29 MED ORDER — URSODIOL 500 MG PO TABS: 500.0000 mg | ORAL_TABLET | Freq: Two times a day (BID) | ORAL | 3 refills | Status: DC

## 2024-02-29 NOTE — Discharge Instructions (Signed)
 Please return to the MAU (Maternity Assessment Unit) if you experience vaginal bleeding,leaking/gush of fluid like your water broke, notice decreased movement from your baby after doing kick counts, or contractions the are becoming more intense or more frequent.   Fetal Movement Counts When you're pregnant, you might start feeling your baby move around the middle of your pregnancy. At first, these movements might feel like flutters, rolls, or swishes. As your baby grows, you might feel more kicks and jabs. Around week 28 of your pregnancy, your health care team may ask you to count how often your baby moves. This is important for all pregnancies, but especially for high-risk ones. Counting movements can help lessen the risk of stillbirth.  What is a fetal movement count? A fetal movement count is the number of times that you feel your baby move during a certain amount of time. This may also be called a kick count. There are many ways to do a kick count. Ask your team what is best for you. Pay attention to when your baby is most active. You may notice your baby's sleep and wake cycles. You may also notice things that make your baby move more. When you do a kick count, try to do it: When your baby is normally most active. At the same time each day.  How do I count fetal movements? Find a quiet, comfortable area. Sit or lie down. Write down the date, the start time, and the number of movements you feel. Count kicks, flutters, swishes, rolls, and jabs. Usually, you will feel at least 10 movements within 2 hours. Stop counting after you have felt 10 movements or if you have been counting for 2 hours. Write down the stop time.  Contact a health care provider if:  You don't feel 10 movements in 2 hours. Your baby isn't moving as it usually does. Your baby isn't moving at all. If you're not able to reach your provider, go to an emergency room. This information is not intended to replace advice given  to you by your health care provider. Make sure you discuss any questions you have with your health care provider.  Document Revised: 06/15/2023 Document Reviewed: 06/07/2022 Elsevier Patient Education  2025 [employer].  Signs and Symptoms of Labor Labor is the body's natural process of moving the baby and the placenta out of the uterus. The process of labor usually starts when the baby is full-term, 37 weeks and 0 days or more.   As your body prepares for labor and the birth of your baby, you may notice the following symptoms in the weeks and days before true labor starts: Your baby dropping lower into your pelvis to get into position for birth (lightening). When this happens, you may feel more pressure on your bladder and pelvic bone and less pressure on your ribs. This may make it easier to breathe. It may also cause you to need to urinate more often and have problems with bowel movements. Having practice contractions, also called Braxton Hicks contractions or false labor. These occur at irregular (unevenly spaced) intervals that are more than 10 minutes apart. False labor contractions are common after exercise or sexual activity. They will stop if you change position, rest, or drink fluids. These contractions are usually mild and do not get stronger over time. They may feel like: A backache or back pain. Mild cramps, similar to menstrual cramps. Tightening or pressure in your abdomen. Passing a small amount of thick, bloody mucus from  your vagina. This is called normal bloody show or losing your mucus plug. This may happen more than a week before labor begins, or right before labor begins, as the opening of the cervix starts to widen (dilate). For some women, the entire mucus plug passes at once. For others, pieces of the mucus plug may gradually pass over several days.  Signs and symptoms that labor has begun Signs that you are in labor may include: Having contractions that come at  regular (evenly spaced) intervals and increase in intensity. This may feel like more intense tightening or pressure in your abdomen that moves to your back. Feeling pressure in the vaginal area. Your water breaking (called rupture of membranes). This is when the sac of fluid that surrounds your baby breaks. Fluid leaking from your vagina may be clear or blood-tinged. Labor usually starts within 24 hours of your water breaking, but it may take longer to begin. Some people may feel a sudden gush of fluid; others may notice repeatedly damp underwear.  Go to the hospital when  Your water breaks. Your labor has started: painful, regular contractions that are 5 minutes apart or less. You have a fever. You have bright red blood coming from your vagina. You do not feel your baby moving. You have a severe headache with or without vision problems. You have chest pain or shortness of breath. These symptoms may represent a serious problem that is an emergency. Do not wait to see if the symptoms will go away. Get medical help right away. Call your local emergency services (911 in the [place] Do not drive yourself to the hospital.  Summary Labor is your body's natural process of moving your baby and the placenta out of your uterus. The process of labor usually starts when your baby is full-term When labor starts, or if your water breaks, call your health care provider or nurse care line. Based on your situation, they will determine when you should go in for an exam. This information is not intended to replace advice given to you by your health care provider. Make sure you discuss any questions you have with your health care provider.  Document Revised: 10/05/2020 Document Reviewed: 10/05/2020-- adapted Elsevier Patient Education  2024 [employer].

## 2024-02-29 NOTE — Telephone Encounter (Signed)
 [facility] reported critical result: Bile Acid 14.1.  Dr. Eldonna aware and contacting pt.   Waddell, RN

## 2024-02-29 NOTE — MAU Note (Addendum)
 FHR- 145 Pt says last movement was in lobby. But before - 5 kicks since 5pm. She ate and drank- but still less movement . [facility]- clinic. No UC's

## 2024-02-29 NOTE — Telephone Encounter (Signed)
I contacted the patient via MyChart

## 2024-02-29 NOTE — Telephone Encounter (Signed)
 Pt left voicemail requesting to speak with Dr. Eldonna about elevated Bile Acid results she saw in MyChart.  She states she is very worried and wants to know what should be done before weekend.     Waddell, RN

## 2024-02-29 NOTE — MAU Provider Note (Signed)
 History     CSN: [id number]  Arrival date and time: 02/29/24 1947 First Provider Initiated Contact with Patient   Chief Complaint  Patient presents with   Decreased Fetal Movement    HPI Pamela Booker is a 28 y.o. [id number] at [redacted]w[redacted]d, 05/02/2024, by Last Menstrual Period, who presents to the Maternity Assessment Unit for DFM. She performed kick counts at home before presenting to MAU and then felt good FM in the waiting area.   ROS Contractions: No  Fetal Movement: Yes  Vaginal bleeding: No  LOF: No    Medications Prior to Admission  Medication Sig Dispense Refill Last Dose/Taking   diazepam  (VALIUM ) 5 MG tablet Take 5 mg by mouth 2 (two) times daily.   Past Week   escitalopram  (LEXAPRO ) 20 MG tablet Take 20 mg by mouth daily.   02/29/2024   folic acid (FOLVITE) 1 MG tablet Take 4 mg by mouth daily.   02/29/2024   ondansetron  (ZOFRAN -ODT) 4 MG disintegrating tablet Take 1 tablet (4 mg total) by mouth every 6 (six) hours as needed for nausea. 30 tablet 1 Past Week   OXcarbazepine  (TRILEPTAL ) 150 MG tablet Take 4 tablets (600 mg total) by mouth 2 (two) times daily.   02/29/2024   pantoprazole  (PROTONIX ) 40 MG tablet Take 1 tablet (40 mg total) by mouth 2 (two) times daily. 60 tablet 3 02/29/2024   Prenatal Vit-Fe Fumarate-FA (MULTIVITAMIN-PRENATAL) 27-0.8 MG TABS tablet Take 2 tablets by mouth daily at 12 noon.   02/29/2024   pyridoxine  (B-6) 100 MG tablet Take 100 mg by mouth daily.   02/29/2024   sucralfate  (CARAFATE ) 1 GM/10ML suspension Take 10 mLs (1 g total) by mouth daily. Take after evening meal 420 mL 0 02/28/2024   valACYclovir  (VALTREX ) 500 MG tablet Take 500 mg by mouth daily as needed (Cold sores).   02/29/2024   cyclobenzaprine  (FLEXERIL ) 10 MG tablet Take 1 tablet (10 mg total) by mouth 2 (two) times daily as needed for muscle spasms. 20 tablet 0    Heating Pads PADS 1 Pad by Does not apply route as needed (cramping).      metroNIDAZOLE  (FLAGYL ) 500 MG tablet Take 1  tablet (500 mg total) by mouth 2 (two) times daily for 7 days. (Patient not taking: Reported on 02/27/2024) 14 tablet 0    ursodiol  (ACTIGALL ) 500 MG tablet Take 1 tablet (500 mg total) by mouth 2 (two) times daily. 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     EAB 11/19.   Started back on continuous nuvaring thereafter  1/20- excessive bleeding. U/s with 3cm EMS. Placed on provera 10mg  bid in addition to nuvaring.  Encourage progesterone  taper. Have full menses at end of 3wks of Nuvaring.   2/20- u/s  with resolved thickening of EMS to 4mm  Restart continuous nuvaring   EAB 11/19.   Sta   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/04/2022   Formatting of this note might be different from the original. Pap 3/22- LGSIL. Colpo 4/22- CIN 1 Pap 10/23- negative     Migraine headache 07/05/2021   Last Assessment & Plan:     As needed Fioricet  for now     Migraine without aura and responsive to treatment 12/11/2014   Migraines    Nausea & vomiting 11/23/2022   Ovarian cyst    Palpitations    Partial epilepsy with impairment of consciousness (HCC) 06/06/2022   PVC (premature ventricular contraction) 03/11/2018   Seizures (HCC)    last seizure was 2 weeks ago (app.12-18-22)   Shortness of breath 04/25/2016   Status post  placement of VNS (vagus nerve stimulation) device    Syncope and collapse 06/10/2019   TB lung, 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     Longstanding. Reports occasional tearing and discomfort with sex.   She would like hormones checked.  She states that her estrogen level was low several years ago after a multiyear course of Depo-Provera.  Labs with low estradiol , FSH, and LH. Normal prolactin & TSH.[name]  Discussed increasing her estrogen level by changing to a higher     Past Surgical History:  Procedure Laterality Date   CYSTOSCOPY W/ URETERAL STENT REMOVAL Left 01/09/2023   Procedure: CYSTOSCOPY WITH STENT REMOVAL;  Surgeon: Twylla Glendia BROCKS, MD;  Location: [facility];  Service: Urology;  Laterality: Left;   CYSTOSCOPY/URETEROSCOPY/HOLMIUM LASER/STENT PLACEMENT Left 01/02/2023   Procedure: CYSTOSCOPY/URETEROSCOPY/HOLMIUM LASER/STENT PLACEMENT;  Surgeon: Twylla Glendia BROCKS, MD;  Location: [facility];  Service: Urology;  Laterality: Left;   CYSTOSCOPY/URETEROSCOPY/HOLMIUM LASER/STENT PLACEMENT Right 01/09/2023   Procedure: CYSTOSCOPY/URETEROSCOPY/HOLMIUM LASER/STENT PLACEMENT;  Surgeon: Twylla Glendia BROCKS, MD;  Location: [facility];  Service: Urology;  Laterality: Right;   DILATION AND EVACUATION N/A 05/17/2023   Procedure: DILATATION AND EVACUATION;  Surgeon: Izell Harari, MD;  Location: [facility];  Service: Gynecology;  Laterality: N/A;   ENDOMETRIAL ABLATION  06/2022   [facility]   LAPAROSCOPY     x5 for endometriosis   LOOP RECORDER INSERTION  2020   LOOP RECORDER REMOVAL  2021   THYROIDECTOMY, PARTIAL     TONSILLECTOMY     URETEROSCOPY Left 01/09/2023   Procedure: URETEROSCOPY;  Surgeon: Twylla Glendia BROCKS, MD;  Location: [facility];  Service: Urology;  Laterality: Left;   VAGUS NERVE STIMULATOR INSERTION  2022     Allergies:   Allergies  Allergen Reactions   Zolpidem Other (See Comments)    seizure  Other Reaction(s): Other (See Comments)    seizure   Diphenhydramine  Other (See Comments)    Crawling feeling under skin  Crawling feeling under skin    It feels as if I have things crawling on my skin.  It feels as if I have things crawling on my skin.     Crawling feeling under skin    It feels as if I have things crawling on my skin.   Crawling feeling under skin    Crawling feeling under skin  It feels as if I have things crawling on my skin.   Ibuprofen Other (See Comments)    Other reaction(s):  GI Upset (intolerance)  Other reaction(s): GI Upset (intolerance) Other reaction(s): GI Upset (intolerance)    Other reaction(s): GI Upset (intolerance)    I have stomach ulcers and I get really bad cramping.  Other reaction(s): GI Upset (intolerance)    I have stomach ulcers and I get really bad cramping.    Other reaction(s): GI Upset (intolerance) Other reaction(s): GI Upset (intolerance)    Other reaction(s): GI Upset (intolerance) Other reaction(s): GI Upset (intolerance)  Other reaction(s): GI Upset (intolerance)  I have stomach ulcers and I get really bad cramping.   Nsaids Other (See Comments)    History of bleeding ulcers   Compazine  [Prochlorperazine ]    Keppra  [Levetiracetam ] Other (See Comments)    Makes her feel hungover    Phenergan  [Promethazine ]    Emgality [Galcanezumab-Gnlm] Rash   Lorazepam  Itching    ROS reviewed and pertinent positives and negatives as documented in HPI.    Physical Exam  BP 125/75 (BP Location: Right Arm)   Pulse (!) 104   Temp 98.7 F (37.1 C) (Oral)   Resp 12   Ht 5' 1.75 (1.568 m)   Wt 91.6 kg   LMP 07/27/2023   BMI 37.25 kg/m   Gen: alert, no acute distress CV: regular rate Resp: nonlabored Abd: gravid  Cervical Exam  N/A  FHT Baseline: 140 bpm Variability: Good {> 6 bpm) Accelerations:  Reactive Decelerations: Absent Uterine activity: None  Labs O/Positive/-- (05/05 1656)   No results found for this or any previous visit (from the past 24 hours).  Imaging No results found.   Assessment and Plan  MDM Pamela Booker is a 28 y.o. [id number] at [redacted]w[redacted]d, 05/02/2024, by Last Menstrual Period, who presents to the MAU for DFM. Ddx: decreased fetal movement includes but is not limited to: fetal sleep, poor maternal perception of movement, medications, early gestational age, decreased/increased amniotic fluid volume, maternal position (sitting or standing versus lying), fetal position (anterior position of the fetal spine), anterior placenta, maternal physical activity. Pt feeling reassured after reactive NST.  1. Long-term current use of benzodiazepine (Primary)  2. Hyperemesis gravidarum  3. Cholestasis during pregnancy in third trimester  4. Decreased fetal movements in third trimester, single or unspecified fetus  5. [redacted] weeks gestation of pregnancy  Continue AEMs Picked up ursodiol  today, has not started yet NST reassuring  Results pending at the time of DC: none Dispo: DC home in stable condition with return precautions discussed and included in AVS.    Barabara Maier, DO FMOB Fellow, Faculty Practice [facility], Center for [facility]

## 2024-03-04 ENCOUNTER — Other Ambulatory Visit: Payer: Self-pay

## 2024-03-04 DIAGNOSIS — O26643 Intrahepatic cholestasis of pregnancy, third trimester: Secondary | ICD-10-CM

## 2024-03-05 ENCOUNTER — Ambulatory Visit (INDEPENDENT_AMBULATORY_CARE_PROVIDER_SITE_OTHER)

## 2024-03-05 ENCOUNTER — Other Ambulatory Visit: Payer: Self-pay

## 2024-03-05 DIAGNOSIS — O26643 Intrahepatic cholestasis of pregnancy, third trimester: Secondary | ICD-10-CM

## 2024-03-05 DIAGNOSIS — Z3A31 31 weeks gestation of pregnancy: Secondary | ICD-10-CM | POA: Diagnosis not present

## 2024-03-07 ENCOUNTER — Ambulatory Visit

## 2024-03-10 ENCOUNTER — Encounter: Payer: Self-pay | Admitting: Obstetrics & Gynecology

## 2024-03-11 ENCOUNTER — Other Ambulatory Visit: Payer: Self-pay

## 2024-03-11 DIAGNOSIS — O0993 Supervision of high risk pregnancy, unspecified, third trimester: Secondary | ICD-10-CM

## 2024-03-11 DIAGNOSIS — O26643 Intrahepatic cholestasis of pregnancy, third trimester: Secondary | ICD-10-CM

## 2024-03-12 ENCOUNTER — Ambulatory Visit (INDEPENDENT_AMBULATORY_CARE_PROVIDER_SITE_OTHER)

## 2024-03-12 ENCOUNTER — Other Ambulatory Visit: Payer: Self-pay

## 2024-03-12 ENCOUNTER — Ambulatory Visit: Admitting: Family Medicine

## 2024-03-12 VITALS — BP 127/83 | HR 85 | Wt 203.8 lb

## 2024-03-12 DIAGNOSIS — Z9689 Presence of other specified functional implants: Secondary | ICD-10-CM

## 2024-03-12 DIAGNOSIS — O09299 Supervision of pregnancy with other poor reproductive or obstetric history, unspecified trimester: Secondary | ICD-10-CM

## 2024-03-12 DIAGNOSIS — O210 Mild hyperemesis gravidarum: Secondary | ICD-10-CM

## 2024-03-12 DIAGNOSIS — O26643 Intrahepatic cholestasis of pregnancy, third trimester: Secondary | ICD-10-CM

## 2024-03-12 DIAGNOSIS — Z3A32 32 weeks gestation of pregnancy: Secondary | ICD-10-CM

## 2024-03-12 DIAGNOSIS — G40909 Epilepsy, unspecified, not intractable, without status epilepticus: Secondary | ICD-10-CM

## 2024-03-12 DIAGNOSIS — O09293 Supervision of pregnancy with other poor reproductive or obstetric history, third trimester: Secondary | ICD-10-CM

## 2024-03-12 DIAGNOSIS — O0993 Supervision of high risk pregnancy, unspecified, third trimester: Secondary | ICD-10-CM

## 2024-03-12 DIAGNOSIS — Z8619 Personal history of other infectious and parasitic diseases: Secondary | ICD-10-CM

## 2024-03-12 MED ORDER — VALACYCLOVIR HCL 500 MG PO TABS: 500.0000 mg | ORAL_TABLET | Freq: Two times a day (BID) | ORAL | 6 refills | Status: AC

## 2024-03-12 MED ORDER — HYDROXYZINE HCL 25 MG PO TABS: 25.0000 mg | ORAL_TABLET | Freq: Four times a day (QID) | ORAL | 2 refills | Status: AC | PRN

## 2024-03-12 NOTE — Progress Notes (Signed)
   PRENATAL VISIT NOTE  Subjective:  Pamela Booker is a 28 y.o. [phone] at [redacted]w[redacted]d being seen today for ongoing prenatal care.  She is currently monitored for the following issues for this high-risk pregnancy and has PVC (premature ventricular contraction); S/P placement of VNS (vagus nerve stimulation) device; Epilepsy (HCC); Migraine; Hx of preeclampsia, prior pregnancy, currently pregnant; Supervision of high-risk pregnancy; Long-term current use of benzodiazepine; Hyperemesis gravidarum; Nonspecific syndrome suggestive of viral illness; Thyroid  nodule; History of lobectomy of thyroid ; and Cholestasis during pregnancy in third trimester on their problem list.  Patient reports no complaints.  Contractions: Irritability. Vag. Bleeding: None.  Movement: Present. Denies leaking of fluid.   The following portions of the patient's history were reviewed and updated as appropriate: allergies, current medications, past family history, past medical history, past social history, past surgical history and problem list.   Objective:    Vitals:   03/12/24 1530  BP: 127/83  Pulse: 85  Weight: 203 lb 12.8 oz (92.4 kg)    Fetal Status:  Fetal Heart Rate (bpm): 141 Fundal Height: 33 cm Movement: Present Presentation: Vertex  General: Alert, oriented and cooperative. Patient is in no acute distress.  Skin: Skin is warm and dry. No rash noted.   Cardiovascular: Normal heart rate noted  Respiratory: Normal respiratory effort, no problems with respiration noted  Abdomen: Soft, gravid, appropriate for gestational age.  Pain/Pressure: Present     Pelvic: Cervical exam deferred Dilation: Fingertip Effacement (%): Thick Station: -3  Extremities: Normal range of motion.  Edema: Trace  Mental Status: Normal mood and affect. Normal behavior. Normal judgment and thought content.   Assessment and Plan:  Pregnancy: G5P1031 at [redacted]w[redacted]d 1. [redacted] weeks gestation of pregnancy  2. Supervision of high risk  pregnancy in third trimester (Primary) Up to date Feeling vigorous movement FH appropriate Feeling BH contractions that are intense.   3. Cholestasis during pregnancy in third trimester Monitoring labs today IOL at 37 weeks  4. Nonintractable epilepsy without status epilepticus, unspecified epilepsy type (HCC) Had one but resolved quickly  5. Hx of preeclampsia, prior pregnancy, currently pregnant Monitoring BP Taking ASA  6. S/P placement of VNS (vagus nerve stimulation) device Partner has magnet  7. Hyperemesis gravidarum Well controlled  Preterm labor symptoms and general obstetric precautions including but not limited to vaginal bleeding, contractions, leaking of fluid and fetal movement were reviewed in detail with the patient. Please refer to After Visit Summary for other counseling recommendations.   No follow-ups on file.  Future Appointments  Date Time Provider Department Center  03/19/2024  2:35 PM [facility] US2 [facility]  03/25/2024  3:55 PM [facility] US2 [facility]  03/26/2024  3:35 PM Eldonna Suzen Octave, MD [facility]  03/28/2024  4:20 PM Tobb, Dub, DO [facility] None  04/02/2024  3:55 PM [facility] US2 [facility]  04/09/2024  1:35 PM Eldonna Suzen Octave, MD [facility]  04/09/2024  3:55 PM [facility] US2 [facility]  04/28/2024 10:30 AM Celestia Rima, NP [facility] None    Suzen Octave Eldonna, MD

## 2024-03-14 ENCOUNTER — Telehealth: Payer: Self-pay

## 2024-03-14 LAB — CBC
Hematocrit: 33.3 % — ABNORMAL LOW (ref 34.0–46.6)
Hemoglobin: 11.1 g/dL (ref 11.1–15.9)
MCH: 31.2 pg (ref 26.6–33.0)
MCHC: 33.3 g/dL (ref 31.5–35.7)
MCV: 94 fL (ref 79–97)
Platelets: 309 x10E3/uL (ref 150–450)
RBC: 3.56 x10E6/uL — ABNORMAL LOW (ref 3.77–5.28)
RDW: 12.4 % (ref 11.7–15.4)
WBC: 11.8 x10E3/uL — ABNORMAL HIGH (ref 3.4–10.8)

## 2024-03-14 LAB — COMPREHENSIVE METABOLIC PANEL WITH GFR
ALT: 15 IU/L (ref 0–32)
AST: 14 IU/L (ref 0–40)
Albumin: 3.6 g/dL — ABNORMAL LOW (ref 4.0–5.0)
Alkaline Phosphatase: 241 IU/L — ABNORMAL HIGH (ref 41–116)
BUN/Creatinine Ratio: 9 (ref 9–23)
BUN: 4 mg/dL — ABNORMAL LOW (ref 6–20)
Bilirubin Total: <0.2 mg/dL (ref 0.0–1.2)
CO2: 18 mmol/L — ABNORMAL LOW (ref 20–29)
Calcium: 9.8 mg/dL (ref 8.7–10.2)
Chloride: 100 mmol/L (ref 96–106)
Creatinine, Ser: 0.47 mg/dL — ABNORMAL LOW (ref 0.57–1.00)
Globulin, Total: 2.4 g/dL (ref 1.5–4.5)
Glucose: 129 mg/dL — ABNORMAL HIGH (ref 70–99)
Potassium: 3.7 mmol/L (ref 3.5–5.2)
Sodium: 136 mmol/L (ref 134–144)
Total Protein: 6 g/dL (ref 6.0–8.5)
eGFR: 131 mL/min/1.73 (ref >59)

## 2024-03-14 LAB — BILE ACIDS, TOTAL: Bile Acids Total: 16.2 umol/L (ref 0.0–10.0)

## 2024-03-14 NOTE — Telephone Encounter (Signed)
 [facility] employee left voicemail for critical lab value.   Component     Latest Ref Rng 03/12/2024  Bile Acids Total     0.0 - 10.0 umol/L 16.2 (HH)     Component     Latest Ref Rng 03/12/2024  WBC     3.4 - 10.8 x10E3/uL 11.8 (H)   RBC     3.77 - 5.28 x10E6/uL 3.56 (L)   Hemoglobin     11.1 - 15.9 g/dL 88.8   HCT     65.9 - 53.3 % 33.3 (L)   MCV     79 - 97 fL 94   MCH     26.6 - 33.0 pg 31.2   MCHC     31.5 - 35.7 g/dL 66.6   RDW     88.2 - 84.5 % 12.4   Platelets     150 - 450 x10E3/uL 309     Legend: (H) High (L) Low Component     Latest Ref Rng 03/12/2024  Glucose     70 - 99 mg/dL 870 (H)   BUN     6 - 20 mg/dL 4 (L)   Creatinine     0.57 - 1.00 mg/dL 9.52 (L)   eGFR     >40 mL/min/1.73 131   BUN/Creatinine Ratio     9 - 23  9   Sodium     134 - 144 mmol/L 136   Potassium     3.5 - 5.2 mmol/L 3.7   Chloride     96 - 106 mmol/L 100   CO2     20 - 29 mmol/L 18 (L)   Calcium     8.7 - 10.2 mg/dL 9.8   Total Protein     6.0 - 8.5 g/dL 6.0   Albumin     4.0 - 5.0 g/dL 3.6 (L)   Globulin, Total     1.5 - 4.5 g/dL 2.4   Total Bilirubin     0.0 - 1.2 mg/dL <9.7   Alkaline Phosphatase     41 - 116 IU/L 241 (H)   AST     0 - 40 IU/L 14   ALT     0 - 32 IU/L 15

## 2024-03-17 ENCOUNTER — Other Ambulatory Visit: Payer: Self-pay | Admitting: Obstetrics and Gynecology

## 2024-03-17 ENCOUNTER — Ambulatory Visit

## 2024-03-17 ENCOUNTER — Ambulatory Visit: Payer: Self-pay | Admitting: Family Medicine

## 2024-03-17 ENCOUNTER — Ambulatory Visit: Attending: Obstetrics and Gynecology | Admitting: Obstetrics

## 2024-03-17 VITALS — BP 130/75 | HR 87

## 2024-03-17 DIAGNOSIS — O210 Mild hyperemesis gravidarum: Secondary | ICD-10-CM

## 2024-03-17 DIAGNOSIS — G40909 Epilepsy, unspecified, not intractable, without status epilepticus: Secondary | ICD-10-CM

## 2024-03-17 DIAGNOSIS — K831 Obstruction of bile duct: Secondary | ICD-10-CM

## 2024-03-17 DIAGNOSIS — O26613 Liver and biliary tract disorders in pregnancy, third trimester: Secondary | ICD-10-CM | POA: Insufficient documentation

## 2024-03-17 DIAGNOSIS — O99353 Diseases of the nervous system complicating pregnancy, third trimester: Secondary | ICD-10-CM | POA: Insufficient documentation

## 2024-03-17 DIAGNOSIS — O09293 Supervision of pregnancy with other poor reproductive or obstetric history, third trimester: Secondary | ICD-10-CM

## 2024-03-17 DIAGNOSIS — Z3A33 33 weeks gestation of pregnancy: Secondary | ICD-10-CM

## 2024-03-17 DIAGNOSIS — O26643 Intrahepatic cholestasis of pregnancy, third trimester: Secondary | ICD-10-CM | POA: Diagnosis not present

## 2024-03-17 DIAGNOSIS — Z79899 Other long term (current) drug therapy: Secondary | ICD-10-CM

## 2024-03-17 NOTE — Progress Notes (Signed)
 MFM Consult Note  Pamela Booker is currently at 33 weeks and 3 days.  She has been followed due to cholestasis of pregnancy that is treated with Actigall  500 mg twice a day.  Her most recent total bile acid level was 16.2.  Her AST and ALT levels are within normal limits.    She reports that her itching symptoms are improving.  Sonographic findings Single intrauterine pregnancy. Fetal cardiac activity: Observed. Presentation: Cephalic. Fetal biometry shows the estimated fetal weight of 5 pounds 5 ounces measures at the 71st percentile. Amniotic fluid: Within normal limits.  AFI 14.56 cm.  MVP: 5.45 cm. Placenta: Anterior. BPP: 8/8.   There are limitations of prenatal ultrasound such as the inability to detect certain abnormalities due to poor visualization. Various factors such as fetal position, gestational age and maternal body habitus may increase the difficulty in visualizing the fetal anatomy.    Due to cholestasis of pregnancy, she should continue weekly fetal testing until delivery.    Delivery should occur at around 37 weeks.    She already has weekly BPP's scheduled in your office for the next 3 weeks.    No further exams were scheduled in our office.    The patient stated that all of her questions were answered today.  A total of 20 minutes was spent counseling and coordinating the care for this patient.  Greater than 50% of the time was spent in direct face-to-face contact.

## 2024-03-18 ENCOUNTER — Other Ambulatory Visit: Payer: Self-pay

## 2024-03-18 DIAGNOSIS — O26643 Intrahepatic cholestasis of pregnancy, third trimester: Secondary | ICD-10-CM

## 2024-03-18 NOTE — Telephone Encounter (Signed)
 Aware of lab and patient is already on treatment

## 2024-03-18 NOTE — Progress Notes (Signed)
 b

## 2024-03-19 ENCOUNTER — Other Ambulatory Visit

## 2024-03-19 NOTE — Progress Notes (Signed)
 IOL form completed and faxed.    Waddell, RN

## 2024-03-22 ENCOUNTER — Encounter: Payer: Self-pay | Admitting: Family Medicine

## 2024-03-23 ENCOUNTER — Encounter (HOSPITAL_COMMUNITY): Payer: Self-pay | Admitting: Obstetrics & Gynecology

## 2024-03-23 ENCOUNTER — Inpatient Hospital Stay (HOSPITAL_COMMUNITY)
Admission: AD | Admit: 2024-03-23 | Discharge: 2024-03-24 | Disposition: A | Discharge status: Home / Self Care | Attending: Obstetrics and Gynecology | Admitting: Obstetrics and Gynecology

## 2024-03-23 DIAGNOSIS — O4703 False labor before 37 completed weeks of gestation, third trimester: Secondary | ICD-10-CM | POA: Insufficient documentation

## 2024-03-23 DIAGNOSIS — Z79899 Other long term (current) drug therapy: Secondary | ICD-10-CM | POA: Diagnosis not present

## 2024-03-23 DIAGNOSIS — O26643 Intrahepatic cholestasis of pregnancy, third trimester: Secondary | ICD-10-CM | POA: Diagnosis not present

## 2024-03-23 DIAGNOSIS — O139 Gestational [pregnancy-induced] hypertension without significant proteinuria, unspecified trimester: Secondary | ICD-10-CM | POA: Diagnosis present

## 2024-03-23 DIAGNOSIS — O133 Gestational [pregnancy-induced] hypertension without significant proteinuria, third trimester: Secondary | ICD-10-CM | POA: Insufficient documentation

## 2024-03-23 DIAGNOSIS — G40219 Localization-related (focal) (partial) symptomatic epilepsy and epileptic syndromes with complex partial seizures, intractable, without status epilepticus: Secondary | ICD-10-CM | POA: Diagnosis not present

## 2024-03-23 DIAGNOSIS — Z3A34 34 weeks gestation of pregnancy: Secondary | ICD-10-CM | POA: Insufficient documentation

## 2024-03-23 DIAGNOSIS — O99353 Diseases of the nervous system complicating pregnancy, third trimester: Secondary | ICD-10-CM | POA: Diagnosis not present

## 2024-03-23 DIAGNOSIS — G40909 Epilepsy, unspecified, not intractable, without status epilepticus: Secondary | ICD-10-CM

## 2024-03-23 DIAGNOSIS — Z9682 Presence of neurostimulator: Secondary | ICD-10-CM | POA: Diagnosis not present

## 2024-03-23 DIAGNOSIS — Z9689 Presence of other specified functional implants: Secondary | ICD-10-CM

## 2024-03-23 LAB — CBC
HCT: 35.1 % — ABNORMAL LOW (ref 36.0–46.0)
Hemoglobin: 11.5 g/dL — ABNORMAL LOW (ref 12.0–15.0)
MCH: 29.6 pg (ref 26.0–34.0)
MCHC: 32.8 g/dL (ref 30.0–36.0)
MCV: 90.5 fL (ref 80.0–100.0)
Platelets: 263 K/uL (ref 150–400)
RBC: 3.88 MIL/uL (ref 3.87–5.11)
RDW: 13 % (ref 11.5–15.5)
WBC: 10.5 K/uL (ref 4.0–10.5)
nRBC: 0.3 % — ABNORMAL HIGH (ref 0.0–0.2)

## 2024-03-23 LAB — URINALYSIS, ROUTINE W REFLEX MICROSCOPIC
Bilirubin Urine: NEGATIVE
Glucose, UA: >=500 mg/dL — AB
Hgb urine dipstick: NEGATIVE
Ketones, ur: NEGATIVE mg/dL
Leukocytes,Ua: NEGATIVE
Nitrite: NEGATIVE
Protein, ur: NEGATIVE mg/dL
Specific Gravity, Urine: 1.012 (ref 1.005–1.030)
pH: 6 (ref 5.0–8.0)

## 2024-03-23 LAB — COMPREHENSIVE METABOLIC PANEL WITH GFR
ALT: 17 U/L (ref 0–44)
AST: 24 U/L (ref 15–41)
Albumin: 2.7 g/dL — ABNORMAL LOW (ref 3.5–5.0)
Alkaline Phosphatase: 218 U/L — ABNORMAL HIGH (ref 38–126)
Anion gap: 14 (ref 5–15)
BUN: 5 mg/dL — ABNORMAL LOW (ref 6–20)
CO2: 21 mmol/L — ABNORMAL LOW (ref 22–32)
Calcium: 10.6 mg/dL — ABNORMAL HIGH (ref 8.9–10.3)
Chloride: 99 mmol/L (ref 98–111)
Creatinine, Ser: 0.56 mg/dL (ref 0.44–1.00)
GFR, Estimated: >60 mL/min (ref >60)
Glucose, Bld: 110 mg/dL — ABNORMAL HIGH (ref 70–99)
Potassium: 3.5 mmol/L (ref 3.5–5.1)
Sodium: 134 mmol/L — ABNORMAL LOW (ref 135–145)
Total Bilirubin: 0.2 mg/dL (ref 0.0–1.2)
Total Protein: 6.4 g/dL — ABNORMAL LOW (ref 6.5–8.1)

## 2024-03-23 LAB — PROTEIN / CREATININE RATIO, URINE
Creatinine, Urine: 80 mg/dL
Protein Creatinine Ratio: 0.11 mg/mg{creat} (ref 0.00–0.15)
Total Protein, Urine: 9 mg/dL

## 2024-03-23 MED ORDER — FAMOTIDINE IN NACL 20-0.9 MG/50ML-% IV SOLN
20.0000 mg | Freq: Once | INTRAVENOUS | Status: AC
  Administered 2024-03-23: 20 mg via INTRAVENOUS
  Filled 2024-03-23: qty 50

## 2024-03-23 MED ORDER — MORPHINE SULFATE (PF) 4 MG/ML IV SOLN
4.0000 mg | Freq: Once | INTRAVENOUS | Status: AC
  Administered 2024-03-23: 4 mg via INTRAMUSCULAR
  Filled 2024-03-23: qty 1

## 2024-03-23 MED ORDER — OXCARBAZEPINE 300 MG PO TABS
600.0000 mg | ORAL_TABLET | Freq: Once | ORAL | Status: AC
  Administered 2024-03-23: 600 mg via ORAL
  Filled 2024-03-23: qty 2

## 2024-03-23 MED ORDER — URSODIOL 300 MG PO CAPS
300.0000 mg | ORAL_CAPSULE | Freq: Once | ORAL | Status: AC
  Administered 2024-03-23: 300 mg via ORAL
  Filled 2024-03-23: qty 1

## 2024-03-23 MED ORDER — LACTATED RINGERS IV BOLUS
1000.0000 mL | Freq: Once | INTRAVENOUS | Status: AC
  Administered 2024-03-23: 1000 mL via INTRAVENOUS

## 2024-03-23 MED ORDER — MORPHINE SULFATE (PF) 4 MG/ML IV SOLN
4.0000 mg | Freq: Once | INTRAVENOUS | Status: AC
  Administered 2024-03-23: 4 mg via SUBCUTANEOUS
  Filled 2024-03-23: qty 1

## 2024-03-23 MED ORDER — ACETAMINOPHEN-CAFFEINE 500-65 MG PO TABS
2.0000 | ORAL_TABLET | Freq: Once | ORAL | Status: AC
  Administered 2024-03-23: 2 via ORAL
  Filled 2024-03-23: qty 2

## 2024-03-23 MED ORDER — MORPHINE SULFATE (PF) 4 MG/ML IV SOLN
4.0000 mg | Freq: Once | INTRAVENOUS | Status: DC
Start: 2024-03-23 — End: 2024-03-23

## 2024-03-23 NOTE — MAU Note (Signed)
 Pamela Booker is a 28 y.o. at [redacted]w[redacted]d here in MAU reporting: has been having Pamela Booker past hour and a half.  Feeling a lot of pressure, just very uncomfortable. No bleeding  or leaking. Reports +FM. Loose stool earlier today.  Onset of complaint: 1630 Pain score: 7 Vitals:   03/23/24 1812  BP: 129/83  Pulse: (!) 104  Resp: 18  Temp: 98.5 F (36.9 C)  SpO2: 97%     FHT:142 Lab orders placed from triage:  urine

## 2024-03-23 NOTE — MAU Provider Note (Cosign Needed)
 History     CSN: [id number]  Arrival date and time: 03/23/24 1753   Event Date/Time   First Provider Initiated Contact with Patient 03/23/24 1913      Chief Complaint  Patient presents with   Contractions   Pamela Booker , a  28 y.o. [phone] at [redacted]w[redacted]d presents to MAU with complaints of contractions that stared today around 6pm. Patient states that contractions last about 30 seconds. She reports increased amounts of vaginal pressure and discomfort as well. She states that she wasn't really timing them but reports very consistent. Currently rating pain as a 7/10. Denies any attempts at relief. Reports that she is trying to stay calm because the pain can cause her to have a seizure. Vagal Nerve Stimulator for epilepsy, poorly controlled seizures. Denies vaginal bleeding, leaking of fluid and vagina; bleeding. She endorses positive fetal movement.           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     EAB 11/19.   Started back on continuous nuvaring thereafter  1/20- excessive bleeding. U/s with 3cm EMS. Placed on provera  10mg  bid in addition to nuvaring.  Encourage progesterone  taper. Have full menses at end of 3wks of Nuvaring.   2/20- u/s with resolved thickening of EMS to 4mm  Restart continuous nuvaring   EAB 11/19.   Sta   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/04/2022   Formatting of this note might be different from the original. Pap 3/22- LGSIL. Colpo 4/22- CIN 1 Pap 10/23- negative     Migraine headache 07/05/2021   Last Assessment & Plan:     As needed Fioricet  for now     Migraine without aura and responsive to treatment 12/11/2014   Migraines    Nausea & vomiting 11/23/2022   Ovarian cyst    Palpitations    Partial epilepsy with impairment of consciousness (HCC) 06/06/2022   PVC (premature ventricular contraction) 03/11/2018   Seizures (HCC)    last seizure was 2 weeks ago (app.12-18-22)   Shortness of breath 04/25/2016   Status post placement of VNS (vagus nerve stimulation) device    Syncope and collapse 06/10/2019   TB lung, latent 06/02/2023   05/2023: patient states she didn't finish her 9 months of treatment     Thyroid  nodule    Vaginal dryness 06/06/2018   Formatting  of this note might be different from the original.     Longstanding. Reports occasional tearing and discomfort with sex.   She would like hormones checked.  She states that her estrogen level was low several years ago after a multiyear course of Depo-Provera.  Labs with low estradiol , FSH, and LH. Normal prolactin & TSH.[name]  Discussed increasing her estrogen level by changing to a higher     Past Surgical History:  Procedure Laterality Date   CYSTOSCOPY W/ URETERAL STENT REMOVAL Left 01/09/2023   Procedure: CYSTOSCOPY WITH STENT REMOVAL;  Surgeon: Twylla Glendia BROCKS, MD;  Location: [facility];  Service: Urology;  Laterality: Left;   CYSTOSCOPY/URETEROSCOPY/HOLMIUM LASER/STENT PLACEMENT Left 01/02/2023   Procedure: CYSTOSCOPY/URETEROSCOPY/HOLMIUM LASER/STENT PLACEMENT;  Surgeon: Twylla Glendia BROCKS, MD;  Location: [facility];  Service:  Urology;  Laterality: Left;   CYSTOSCOPY/URETEROSCOPY/HOLMIUM LASER/STENT PLACEMENT Right 01/09/2023   Procedure: CYSTOSCOPY/URETEROSCOPY/HOLMIUM LASER/STENT PLACEMENT;  Surgeon: Twylla Glendia BROCKS, MD;  Location: [facility];  Service: Urology;  Laterality: Right;   DILATION AND EVACUATION N/A 05/17/2023   Procedure: DILATATION AND EVACUATION;  Surgeon: Izell Harari, MD;  Location: [facility];  Service: Gynecology;  Laterality: N/A;   ENDOMETRIAL ABLATION  06/2022   [facility]   LAPAROSCOPY     x5 for endometriosis   LOOP RECORDER INSERTION  2020   LOOP RECORDER REMOVAL  2021   THYROIDECTOMY, PARTIAL     TONSILLECTOMY     URETEROSCOPY Left 01/09/2023   Procedure: URETEROSCOPY;  Surgeon: Twylla Glendia BROCKS, MD;  Location: [facility];  Service: Urology;  Laterality: Left;   VAGUS NERVE STIMULATOR INSERTION  2022    Family History  Adopted: Yes  Problem Relation Age of Onset   Healthy Daughter     Social History   Tobacco Use   Smoking status: Never   Smokeless tobacco: Never  Vaping Use   Vaping status: Former   Substances: Nicotine  Substance Use Topics   Alcohol use: Not Currently    Comment: socially   Drug use: Not Currently    Comment: THC    Allergies:  Allergies  Allergen Reactions   Zolpidem Other (See Comments)    seizure  Other Reaction(s): Other (See Comments)    seizure   Diphenhydramine  Other (See Comments)    Crawling feeling under skin  Crawling feeling under skin    It feels as if I have things crawling on my skin.  It feels as if I have things crawling on my skin.     Crawling feeling under skin    It feels as if I have things crawling on my skin.   Crawling feeling under skin    Crawling feeling under skin  It feels as if I have things crawling on my skin.   Ibuprofen Other (See Comments)    Other reaction(s): GI Upset (intolerance)  Other reaction(s): GI Upset (intolerance) Other reaction(s): GI Upset (intolerance)    Other  reaction(s): GI Upset (intolerance)    I have stomach ulcers and I get really bad cramping.  Other reaction(s): GI Upset (intolerance)    I have stomach ulcers and I get really bad cramping.    Other reaction(s): GI Upset (intolerance) Other reaction(s): GI Upset (intolerance)    Other reaction(s): GI Upset (intolerance) Other reaction(s): GI Upset (intolerance)  Other reaction(s): GI Upset (intolerance)  I have stomach ulcers and I get really bad cramping.   Nsaids Other (See Comments)    History of bleeding ulcers   Compazine  [Prochlorperazine ]  Keppra  [Levetiracetam ] Other (See Comments)    Makes her feel hungover    Phenergan  [Promethazine ]    Emgality [Galcanezumab-Gnlm] Rash   Lorazepam  Itching    Medications Prior to Admission  Medication Sig Dispense Refill Last Dose/Taking   diazepam  (VALIUM ) 5 MG tablet Take 5 mg by mouth 2 (two) times daily.   Past Month   escitalopram  (LEXAPRO ) 20 MG tablet Take 20 mg by mouth daily.   03/22/2024   folic acid (FOLVITE) 1 MG tablet Take 4 mg by mouth daily.   Past Week   hydrOXYzine (ATARAX) 25 MG tablet Take 1 tablet (25 mg total) by mouth every 6 (six) hours as needed for itching. 30 tablet 2 03/22/2024   ondansetron  (ZOFRAN -ODT) 4 MG disintegrating tablet Take 1 tablet (4 mg total) by mouth every 6 (six) hours as needed for nausea. 30 tablet 1 Past Month   OXcarbazepine  (TRILEPTAL ) 150 MG tablet Take 4 tablets (600 mg total) by mouth 2 (two) times daily.   03/23/2024   pantoprazole  (PROTONIX ) 40 MG tablet Take 1 tablet (40 mg total) by mouth 2 (two) times daily. 60 tablet 3 03/23/2024   Prenatal Vit-Fe Fumarate-FA (MULTIVITAMIN-PRENATAL) 27-0.8 MG TABS tablet Take 2 tablets by mouth daily at 12 noon.   03/22/2024   pyridoxine  (B-6) 100 MG tablet Take 100 mg by mouth daily.   03/22/2024   sucralfate  (CARAFATE ) 1 GM/10ML suspension Take 10 mLs (1 g total) by mouth daily. Take after evening meal 420 mL 0 03/22/2024   ursodiol   (ACTIGALL ) 500 MG tablet Take 1 tablet (500 mg total) by mouth 2 (two) times daily. 60 tablet 3 03/23/2024   valACYclovir  (VALTREX ) 500 MG tablet Take 1 tablet (500 mg total) by mouth 2 (two) times daily. 60 tablet 6 03/23/2024   cyclobenzaprine  (FLEXERIL ) 10 MG tablet Take 1 tablet (10 mg total) by mouth 2 (two) times daily as needed for muscle spasms. 20 tablet 0 More than a month   Heating Pads PADS 1 Pad by Does not apply route as needed (cramping).       Review of Systems  Constitutional:  Negative for chills, fatigue and fever.  Eyes:  Negative for pain and visual disturbance.  Respiratory:  Negative for apnea, shortness of breath and wheezing.   Cardiovascular:  Negative for chest pain and palpitations.  Gastrointestinal:  Positive for abdominal pain. Negative for constipation, diarrhea, nausea and vomiting.  Genitourinary:  Positive for vaginal pain. Negative for difficulty urinating, dysuria, pelvic pain, vaginal bleeding and vaginal discharge.  Musculoskeletal:  Positive for back pain.  Neurological:  Negative for seizures, weakness and headaches.  Psychiatric/Behavioral:  Negative for suicidal ideas.    Physical Exam   Blood pressure 133/86, pulse 85, temperature 98.5 F (36.9 C), temperature source Oral, resp. rate 18, height 5' 1.75 (1.568 m), weight 94 kg, last menstrual period 07/27/2023, SpO2 99%.  Physical Exam Vitals and nursing note reviewed. Exam conducted with a chaperone present (K. Architect).  Constitutional:      General: She is not in acute distress.    Appearance: Normal appearance.  HENT:     Head: Normocephalic.  Cardiovascular:     Rate and Rhythm: Normal rate and regular rhythm.  Pulmonary:     Effort: Pulmonary effort is normal.  Abdominal:     Palpations: Abdomen is soft.     Tenderness: There is abdominal tenderness.  Genitourinary:    Vagina: Vaginal discharge present.     Comments: Dilation: 1 Effacement (%): 60  Cervical Position:  Posterior Presentation: Vertex Exam by:: CANDIE Cedar CNM  Musculoskeletal:        General: Swelling present.     Cervical back: Normal range of motion.     Right lower leg: Edema present.     Left lower leg: Edema present.     Comments: +2 pitting edema.   Skin:    General: Skin is warm and dry.  Neurological:     Mental Status: She is alert and oriented to person, place, and time.  Psychiatric:        Mood and Affect: Mood normal.    FHT: 145 bpm with moderate variability 15x15 accels no decels.  Toco: Ctx every 7-8 mins   Patient Vitals for the past 24 hrs:  BP Temp Temp src Pulse Resp SpO2 Height Weight  03/23/24 2016 133/86 -- -- 85 -- -- -- --  03/23/24 2000 (!) 142/90 -- -- 88 -- 99 % -- --  03/23/24 1945 (!) 145/86 -- -- 95 -- 99 % -- --  03/23/24 1930 (!) 149/80 -- -- 87 -- 99 % -- --  03/23/24 1916 (!) 137/90 -- -- 100 -- -- -- --  03/23/24 1855 (!) 151/97 -- -- 95 -- 99 % -- --  03/23/24 1832 (!) 140/82 -- -- 98 -- -- -- --  03/23/24 1812 129/83 98.5 F (36.9 C) Oral (!) 104 18 97 % 5' 1.75 (1.568 m) 94 kg    MAU Course  Procedures Orders Placed This Encounter  Procedures   Urinalysis, Routine w reflex microscopic -Urine, Clean Catch   CBC   Comprehensive metabolic panel   Protein / creatinine ratio, urine   Results for orders placed or performed during the hospital encounter of 03/23/24 (from the past 24 hours)  Urinalysis, Routine w reflex microscopic -Urine, Clean Catch     Status: Abnormal   Collection Time: 03/23/24  6:15 PM  Result Value Ref Range   Color, Urine YELLOW YELLOW   APPearance HAZY (A) CLEAR   Specific Gravity, Urine 1.012 1.005 - 1.030   pH 6.0 5.0 - 8.0   Glucose, UA >=500 (A) NEGATIVE mg/dL   Hgb urine dipstick NEGATIVE NEGATIVE   Bilirubin Urine NEGATIVE NEGATIVE   Ketones, ur NEGATIVE NEGATIVE mg/dL   Protein, ur NEGATIVE NEGATIVE mg/dL   Nitrite NEGATIVE NEGATIVE   Leukocytes,Ua NEGATIVE NEGATIVE   RBC / HPF 0-5 0 - 5 RBC/hpf    WBC, UA 0-5 0 - 5 WBC/hpf   Bacteria, UA MANY (A) NONE SEEN   Squamous Epithelial / HPF 6-10 0 - 5 /HPF   Mucus PRESENT   CBC     Status: Abnormal   Collection Time: 03/23/24  6:15 PM  Result Value Ref Range   WBC 10.5 4.0 - 10.5 K/uL   RBC 3.88 3.87 - 5.11 MIL/uL   Hemoglobin 11.5 (L) 12.0 - 15.0 g/dL   HCT 64.8 (L) 63.9 - 53.9 %   MCV 90.5 80.0 - 100.0 fL   MCH 29.6 26.0 - 34.0 pg   MCHC 32.8 30.0 - 36.0 g/dL   RDW 86.9 88.4 - 84.4 %   Platelets 263 150 - 400 K/uL   nRBC 0.3 (H) 0.0 - 0.2 %  Comprehensive metabolic panel     Status: Abnormal   Collection Time: 03/23/24  6:15 PM  Result Value Ref Range   Sodium 134 (L) 135 - 145 mmol/L   Potassium 3.5 3.5 - 5.1 mmol/L   Chloride 99 98 -  111 mmol/L   CO2 21 (L) 22 - 32 mmol/L   Glucose, Bld 110 (H) 70 - 99 mg/dL   BUN 5 (L) 6 - 20 mg/dL   Creatinine, Ser 9.43 0.44 - 1.00 mg/dL   Calcium 89.3 (H) 8.9 - 10.3 mg/dL   Total Protein 6.4 (L) 6.5 - 8.1 g/dL   Albumin 2.7 (L) 3.5 - 5.0 g/dL   AST 24 15 - 41 U/L   ALT 17 0 - 44 U/L   Alkaline Phosphatase 218 (H) 38 - 126 U/L   Total Bilirubin 0.2 0.0 - 1.2 mg/dL   GFR, Estimated >39 >39 mL/min   Anion gap 14 5 - 15  Protein / creatinine ratio, urine     Status: None   Collection Time: 03/23/24  6:15 PM  Result Value Ref Range   Creatinine, Urine 80 mg/dL   Total Protein, Urine 9 mg/dL   Protein Creatinine Ratio 0.11 0.00 - 0.15 mg/mg[Cre]     MDM - Patient had an elevated BP today upon arrival. Reports on-going swelling, without improvement and a headache as well.  - PreE labs ordered.  - Excedrin ordered for headache.  - Headache improved from 5--> 4 with Excedrin.  - Reassessment of cervix 2 hours later and cervix unchanged.  - Patient very concerned about uncontrolled seizure disorder. She states that pain exacerbates her seizures and she has already had to swipe her NVS x2 since contractions started.  - Offered therapeutic rest, patient agreeable to plan of care.    - Consulted Dr. Remo on plan of care and MD agreeable to Morphine  4mg  IM, 4mg  IV given alternative routes and onset.  - Orders placed.  - MD also desires a longer stent of observation in MAU.  - Transfer of Care to V. Claudene CNM @ 2140.  Shantonette Erven) Emilio, MSN, CNM  [facility]  03/23/2024 9:41 PM    11:55: Patient was able to get some labs and reports contractions much better after morphine .  Now with like twinges.  Cervical exam unchanged at 1/60/-3.  No further seizure aura.  Headache resolved.  No nausea or vomiting.  Seen evening dose of seizure medicine and ursodiol .   Orders Placed This Encounter  Procedures   Urinalysis, Routine w reflex microscopic -Urine, Clean Catch   CBC   Comprehensive metabolic panel   Protein / creatinine ratio, urine   Discharge patient   Meds ordered this encounter  Medications   lactated ringers  bolus 1,000 mL   acetaminophen -caffeine  (EXCEDRIN TENSION HEADACHE) 500-65 MG per tablet 2 tablet   famotidine  (PEPCID ) IVPB 20 mg premix   DISCONTD: morphine  (PF) 4 MG/ML injection 4 mg    Refill:  0   morphine  (PF) 4 MG/ML injection 4 mg    Refill:  0   morphine  (PF) 4 MG/ML injection 4 mg    Refill:  0   Oxcarbazepine  (TRILEPTAL ) tablet 600 mg   ursodiol  (ACTIGALL ) capsule 300 mg    Assessment and Plan  1. Preterm uterine contractions in third trimester, antepartum (Primary) - No cervical change throughout the visit.  Contractions improved significantly per the patient and toco after IV fluids and morphine  for pain.  Preterm labor precautions.  Encouraged to stay hydrated.  2. Gestational hypertension, third trimester - Preeclampsia labs normal.  Headache resolved with medication.  No evidence of preeclampsia.   - Blood pressure check in 2 days. - Antenatal testing already ordered for cholestasis.  3. Seizure disorder during pregnancy  in third trimester (HCC) - Oxcarbazepine  (TRILEPTAL ) tablet 600 mg - Continue  meds and neurology follow-up appointments.  4. [redacted] weeks gestation of pregnancy   5. Cholestasis during pregnancy in third trimester - ursodiol  (ACTIGALL ) capsule 300 mg - Antenatal testing per MFM. - Delivery scheduled for 37 weeks.  6. S/P placement of VNS (vagus nerve stimulation) device  Discharged home in stable condition  Follow-up Information     [facility] Follow up.   Specialty: Obstetrics and Gynecology Why: 10/21: Biophysical profiles 10/22: Routine prenatal visit and blood pressure check Contact information: [address] [place]  [address] [phone]        [facility] Follow up.   Specialty: Obstetrics and Gynecology Why: As needed if symptoms worsen Contact information: [address] [place]  [address] [phone]               Allergies as of 03/24/2024       Reactions   Zolpidem Other (See Comments)   seizure Other Reaction(s): Other (See Comments)    seizure   Diphenhydramine  Other (See Comments)   Crawling feeling under skin Crawling feeling under skin    It feels as if I have things crawling on my skin. It feels as if I have things crawling on my skin.     Crawling feeling under skin    It feels as if I have things crawling on my skin.   Crawling feeling under skin    Crawling feeling under skin  It feels as if I have things crawling on my skin.   Ibuprofen Other (See Comments)   Other reaction(s): GI Upset (intolerance) Other reaction(s): GI Upset (intolerance) Other reaction(s): GI Upset (intolerance)    Other reaction(s): GI Upset (intolerance)    I have stomach ulcers and I get really bad cramping. Other reaction(s): GI Upset (intolerance)    I have stomach ulcers and I get really bad cramping.    Other reaction(s): GI Upset (intolerance) Other reaction(s): GI Upset (intolerance)     Other reaction(s): GI Upset (intolerance) Other reaction(s): GI Upset (intolerance)  Other reaction(s): GI Upset (intolerance)  I have stomach ulcers and I get really bad cramping.   Nsaids Other (See Comments)   History of bleeding ulcers   Compazine  [prochlorperazine ]    Keppra  [levetiracetam ] Other (See Comments)   Makes her feel hungover    Phenergan  [promethazine ]    Emgality [galcanezumab-gnlm] Rash   Lorazepam  Itching        Medication List     TAKE these medications    cyclobenzaprine  10 MG tablet Commonly known as: FLEXERIL  Take 1 tablet (10 mg total) by mouth 2 (two) times daily as needed for muscle spasms.   diazepam  5 MG tablet Commonly known as: VALIUM  Take 5 mg by mouth 2 (two) times daily.   escitalopram  20 MG tablet Commonly known as: LEXAPRO  Take 20 mg by mouth daily.   folic acid 1 MG tablet Commonly known as: FOLVITE Take 4 mg by mouth daily.   Heating Pads Pads 1 Pad by Does not apply route as needed (cramping).   hydrOXYzine 25 MG tablet Commonly known as: ATARAX Take 1 tablet (25 mg total) by mouth every 6 (six) hours as needed for itching.   multivitamin-prenatal 27-0.8 MG Tabs tablet Take 2 tablets by mouth daily at 12 noon.   ondansetron  4 MG disintegrating tablet Commonly known as: ZOFRAN -ODT Take 1 tablet (  4 mg total) by mouth every 6 (six) hours as needed for nausea.   OXcarbazepine  150 MG tablet Commonly known as: Trileptal  Take 4 tablets (600 mg total) by mouth 2 (two) times daily.   pantoprazole  40 MG tablet Commonly known as: Protonix  Take 1 tablet (40 mg total) by mouth 2 (two) times daily.   pyridoxine  100 MG tablet Commonly known as: B-6 Take 100 mg by mouth daily.   sucralfate  1 GM/10ML suspension Commonly known as: Carafate  Take 10 mLs (1 g total) by mouth daily. Take after evening meal   ursodiol  500 MG tablet Commonly known as: ACTIGALL  Take 1 tablet (500 mg total) by mouth 2 (two) times daily.    valACYclovir  500 MG tablet Commonly known as: VALTREX  Take 1 tablet (500 mg total) by mouth 2 (two) times daily.        Marnell Mcdaniel  Claudene HOWARD 03/24/2024 12:29 AM

## 2024-03-24 ENCOUNTER — Encounter: Payer: Self-pay | Admitting: *Deleted

## 2024-03-24 DIAGNOSIS — O4703 False labor before 37 completed weeks of gestation, third trimester: Secondary | ICD-10-CM

## 2024-03-24 DIAGNOSIS — Z3A34 34 weeks gestation of pregnancy: Secondary | ICD-10-CM

## 2024-03-25 ENCOUNTER — Other Ambulatory Visit: Payer: Self-pay

## 2024-03-25 ENCOUNTER — Other Ambulatory Visit

## 2024-03-25 DIAGNOSIS — O26643 Intrahepatic cholestasis of pregnancy, third trimester: Secondary | ICD-10-CM

## 2024-03-25 DIAGNOSIS — Z3A34 34 weeks gestation of pregnancy: Secondary | ICD-10-CM | POA: Diagnosis not present

## 2024-03-26 ENCOUNTER — Encounter: Payer: Self-pay | Admitting: Family Medicine

## 2024-03-26 ENCOUNTER — Ambulatory Visit (INDEPENDENT_AMBULATORY_CARE_PROVIDER_SITE_OTHER): Admitting: Family Medicine

## 2024-03-26 VITALS — BP 119/87 | Wt 209.2 lb

## 2024-03-26 DIAGNOSIS — O133 Gestational [pregnancy-induced] hypertension without significant proteinuria, third trimester: Secondary | ICD-10-CM

## 2024-03-26 DIAGNOSIS — O210 Mild hyperemesis gravidarum: Secondary | ICD-10-CM

## 2024-03-26 DIAGNOSIS — G5601 Carpal tunnel syndrome, right upper limb: Secondary | ICD-10-CM

## 2024-03-26 DIAGNOSIS — Z3A34 34 weeks gestation of pregnancy: Secondary | ICD-10-CM

## 2024-03-26 DIAGNOSIS — O0993 Supervision of high risk pregnancy, unspecified, third trimester: Secondary | ICD-10-CM | POA: Diagnosis not present

## 2024-03-26 DIAGNOSIS — K2100 Gastro-esophageal reflux disease with esophagitis, without bleeding: Secondary | ICD-10-CM | POA: Diagnosis not present

## 2024-03-26 DIAGNOSIS — O26643 Intrahepatic cholestasis of pregnancy, third trimester: Secondary | ICD-10-CM | POA: Diagnosis not present

## 2024-03-26 DIAGNOSIS — Z9689 Presence of other specified functional implants: Secondary | ICD-10-CM

## 2024-03-26 MED ORDER — SUCRALFATE 1 GM/10ML PO SUSP: 1.0000 g | Freq: Every day | ORAL | 1 refills | Status: DC

## 2024-03-26 NOTE — Progress Notes (Signed)
 PRENATAL VISIT NOTE  Subjective:  Pamela Booker is a 28 y.o. [phone] at [redacted]w[redacted]d being seen today for ongoing prenatal care.  She is currently monitored for the following issues for this high-risk pregnancy and has PVC (premature ventricular contraction); S/P placement of VNS (vagus nerve stimulation) device; Epilepsy (HCC); Migraine; Hx of preeclampsia, prior pregnancy, currently pregnant; Supervision of high-risk pregnancy; Long-term current use of benzodiazepine; Hyperemesis gravidarum; Nonspecific syndrome suggestive of viral illness; Thyroid  nodule; History of lobectomy of thyroid ; Cholestasis during pregnancy in third trimester; Gestational hypertension; and Carpal tunnel syndrome on right on their problem list.  Patient reports seen in MAU for contractions, notes continued contractions but they are intermittent and had no more than 6 in 1 hour.  Contractions: Irritability. Vag. Bleeding: None.  Movement: Present. Denies leaking of fluid.   The following portions of the patient's history were reviewed and updated as appropriate: allergies, current medications, past family history, past medical history, past social history, past surgical history and problem list.   Objective:    Vitals:   03/26/24 1549  BP: 119/87  Weight: 209 lb 3.2 oz (94.9 kg)    Fetal Status:  Fetal Heart Rate (bpm): 162 Fundal Height: 34 cm Movement: Present Presentation: Vertex  General: Alert, oriented and cooperative. Patient is in no acute distress.  Skin: Skin is warm and dry. No rash noted.   Cardiovascular: Normal heart rate noted  Respiratory: Normal respiratory effort, no problems with respiration noted  Abdomen: Soft, gravid, appropriate for gestational age.  Pain/Pressure: Present     Pelvic: Cervical exam performed in the presence of a chaperone Dilation: 1.5 Effacement (%): 50 Station: -1  Extremities: Normal range of motion.  Edema: Mild pitting, slight indentation  Mental Status: Normal  mood and affect. Normal behavior. Normal judgment and thought content.   Carpal Tunnel Injection Procedure note:  Patient diagnosed with carpal tunnel syndrome during pregnancy. Has tried splints without relief of symptoms. Current symptoms are right wrist/hands.  Discussed role of steroid injection to help alleviate symptoms as conservative measures have not provided relief. Reviewed risks/benefits of procedure. Reviewed complications of procedure which include but are not limited to bleeding, elevated blood glucose levels, infection, median nerve injury, pain, and paresthesias  Patient instructed to inform physician of any tingling or loss of sensation in hand.   After obtaining informed verbal and written consent for carpal tunnel injection, proper site was identified on the right wrist one cm medial to the palmaris longus tendon and one cm proximal to the distal palmar crease. The area was cleaned with alcohol and then anesthetized with hurricane spray. A mixture of 2 cc 10 mg/mL Kenalog  (total of 20 mg) and 1 cc of 1% lidocaine  was injected by directing the needle towards the base of the thumb at a 45 degree angle at a depth of approximately one centimeter. Mixture flowed easily and patient reported no paresthesias during the procedure. A bandaid was then applied and patient instructed to mobilize wrist for remainder of day to help solution spread. Patient tolerated the procedure well.  CPT Code: 79473, add right modifier (RT)  Assessment and Plan:  Pregnancy: [id number] at [redacted]w[redacted]d  1. Gastroesophageal reflux disease with esophagitis without hemorrhage Protonix  BID  2. Supervision of high risk pregnancy in third trimester (Primary) Up to date FH appropriate FHR WNL Reports increased swelling in bilateral feet Written out of work for the next 3 days until [facility] is scheduled  3. Gestational hypertension, third trimester BP WNL  4. Cholestasis during pregnancy in third trimester Weekly  BPPS are WNL CMP and Bile Acids drawn today IOL scheduled for 11/7  5. Hyperemesis gravidarum Resolved  6. S/P placement of VNS (vagus nerve stimulation) device No recent seizures Partner has magnet for labor if needed. Recommend RN staff ask about this at admission  7. Carpal tunnel syndrome on right Injection performed  8. [redacted] weeks gestation of pregnancy   Preterm labor symptoms and general obstetric precautions including but not limited to vaginal bleeding, contractions, leaking of fluid and fetal movement were reviewed in detail with the patient. Please refer to After Visit Summary for other counseling recommendations.   Return in about 2 weeks (around 04/09/2024) for Routine prenatal care, 36wks.  Future Appointments  Date Time Provider Department Center  04/02/2024  3:55 PM [facility] US2 [facility]  04/09/2024  1:35 PM Eldonna Suzen Octave, MD [facility]  04/09/2024  3:55 PM [facility] US2 [facility]  04/11/2024 12:00 AM [facility] SCHED ROOM [facility] None  04/28/2024 10:30 AM Celestia Rima, NP [facility] None    Suzen Octave Eldonna, MD

## 2024-03-27 ENCOUNTER — Observation Stay (HOSPITAL_COMMUNITY)

## 2024-03-27 ENCOUNTER — Ambulatory Visit: Payer: Self-pay | Admitting: Family Medicine

## 2024-03-27 ENCOUNTER — Inpatient Hospital Stay (HOSPITAL_COMMUNITY)
Admission: AD | Admit: 2024-03-27 | Discharge: 2024-04-01 | DRG: 806 | Disposition: A | Discharge status: Home / Self Care | Attending: Obstetrics and Gynecology | Admitting: Obstetrics and Gynecology

## 2024-03-27 ENCOUNTER — Encounter (HOSPITAL_COMMUNITY): Payer: Self-pay | Admitting: Obstetrics and Gynecology

## 2024-03-27 DIAGNOSIS — O134 Gestational [pregnancy-induced] hypertension without significant proteinuria, complicating childbirth: Secondary | ICD-10-CM | POA: Diagnosis present

## 2024-03-27 DIAGNOSIS — O368330 Maternal care for abnormalities of the fetal heart rate or rhythm, third trimester, not applicable or unspecified: Secondary | ICD-10-CM | POA: Diagnosis not present

## 2024-03-27 DIAGNOSIS — O99354 Diseases of the nervous system complicating childbirth: Secondary | ICD-10-CM | POA: Diagnosis present

## 2024-03-27 DIAGNOSIS — G40909 Epilepsy, unspecified, not intractable, without status epilepticus: Secondary | ICD-10-CM | POA: Diagnosis present

## 2024-03-27 DIAGNOSIS — O368390 Maternal care for abnormalities of the fetal heart rate or rhythm, unspecified trimester, not applicable or unspecified: Secondary | ICD-10-CM | POA: Diagnosis present

## 2024-03-27 DIAGNOSIS — N809 Endometriosis, unspecified: Secondary | ICD-10-CM | POA: Diagnosis present

## 2024-03-27 DIAGNOSIS — Z3A34 34 weeks gestation of pregnancy: Secondary | ICD-10-CM | POA: Diagnosis not present

## 2024-03-27 DIAGNOSIS — O26643 Intrahepatic cholestasis of pregnancy, third trimester: Secondary | ICD-10-CM | POA: Diagnosis present

## 2024-03-27 DIAGNOSIS — K831 Obstruction of bile duct: Secondary | ICD-10-CM | POA: Diagnosis not present

## 2024-03-27 DIAGNOSIS — Z3A35 35 weeks gestation of pregnancy: Secondary | ICD-10-CM

## 2024-03-27 DIAGNOSIS — G894 Chronic pain syndrome: Secondary | ICD-10-CM | POA: Diagnosis present

## 2024-03-27 DIAGNOSIS — O42913 Preterm premature rupture of membranes, unspecified as to length of time between rupture and onset of labor, third trimester: Secondary | ICD-10-CM | POA: Diagnosis not present

## 2024-03-27 DIAGNOSIS — B009 Herpesviral infection, unspecified: Secondary | ICD-10-CM | POA: Diagnosis present

## 2024-03-27 DIAGNOSIS — O210 Mild hyperemesis gravidarum: Secondary | ICD-10-CM

## 2024-03-27 DIAGNOSIS — Z79899 Other long term (current) drug therapy: Principal | ICD-10-CM

## 2024-03-27 DIAGNOSIS — O26613 Liver and biliary tract disorders in pregnancy, third trimester: Secondary | ICD-10-CM

## 2024-03-27 DIAGNOSIS — A6000 Herpesviral infection of urogenital system, unspecified: Secondary | ICD-10-CM | POA: Diagnosis present

## 2024-03-27 DIAGNOSIS — O9832 Other infections with a predominantly sexual mode of transmission complicating childbirth: Secondary | ICD-10-CM | POA: Diagnosis present

## 2024-03-27 DIAGNOSIS — O0993 Supervision of high risk pregnancy, unspecified, third trimester: Secondary | ICD-10-CM

## 2024-03-27 DIAGNOSIS — O99353 Diseases of the nervous system complicating pregnancy, third trimester: Secondary | ICD-10-CM

## 2024-03-27 LAB — CBC
HCT: 32.4 % — ABNORMAL LOW (ref 36.0–46.0)
Hemoglobin: 10.4 g/dL — ABNORMAL LOW (ref 12.0–15.0)
MCH: 29.1 pg (ref 26.0–34.0)
MCHC: 32.1 g/dL (ref 30.0–36.0)
MCV: 90.8 fL (ref 80.0–100.0)
Platelets: 233 K/uL (ref 150–400)
RBC: 3.57 MIL/uL — ABNORMAL LOW (ref 3.87–5.11)
RDW: 13.2 % (ref 11.5–15.5)
WBC: 9.6 K/uL (ref 4.0–10.5)
nRBC: 0.4 % — ABNORMAL HIGH (ref 0.0–0.2)

## 2024-03-27 LAB — URINALYSIS, ROUTINE W REFLEX MICROSCOPIC
Bilirubin Urine: NEGATIVE
Glucose, UA: >=500 mg/dL — AB
Hgb urine dipstick: NEGATIVE
Ketones, ur: NEGATIVE mg/dL
Leukocytes,Ua: NEGATIVE
Nitrite: NEGATIVE
Protein, ur: NEGATIVE mg/dL
Specific Gravity, Urine: 1.005 (ref 1.005–1.030)
pH: 6 (ref 5.0–8.0)

## 2024-03-27 LAB — POCT FERN TEST
POCT Fern Test: NEGATIVE
POCT Fern Test: NEGATIVE

## 2024-03-27 LAB — COMPREHENSIVE METABOLIC PANEL WITH GFR
ALT: 15 IU/L (ref 0–32)
AST: 22 IU/L (ref 0–40)
Albumin: 3.4 g/dL — ABNORMAL LOW (ref 4.0–5.0)
Alkaline Phosphatase: 262 IU/L — ABNORMAL HIGH (ref 41–116)
BUN/Creatinine Ratio: 11 (ref 9–23)
BUN: 6 mg/dL (ref 6–20)
Bilirubin Total: 0.2 mg/dL (ref 0.0–1.2)
CO2: 20 mmol/L (ref 20–29)
Calcium: 9.8 mg/dL (ref 8.7–10.2)
Chloride: 102 mmol/L (ref 96–106)
Creatinine, Ser: 0.56 mg/dL — ABNORMAL LOW (ref 0.57–1.00)
Globulin, Total: 2.5 g/dL (ref 1.5–4.5)
Glucose: 101 mg/dL — ABNORMAL HIGH (ref 70–99)
Potassium: 4.1 mmol/L (ref 3.5–5.2)
Sodium: 137 mmol/L (ref 134–144)
Total Protein: 5.9 g/dL — ABNORMAL LOW (ref 6.0–8.5)
eGFR: 126 mL/min/1.73 (ref >59)

## 2024-03-27 LAB — WET PREP, GENITAL
Sperm: NONE SEEN
Trich, Wet Prep: NONE SEEN
WBC, Wet Prep HPF POC: >=10 — AB (ref <10)
Yeast Wet Prep HPF POC: NONE SEEN

## 2024-03-27 LAB — BILE ACIDS, TOTAL: Bile Acids Total: 10.8 umol/L (ref 0.0–10.0)

## 2024-03-27 LAB — RUPTURE OF MEMBRANE (ROM)PLUS: Rom Plus: NEGATIVE

## 2024-03-27 LAB — GLUCOSE, CAPILLARY: Glucose-Capillary: 103 mg/dL — ABNORMAL HIGH (ref 70–99)

## 2024-03-27 MED ORDER — PANTOPRAZOLE SODIUM 40 MG PO TBEC
40.0000 mg | DELAYED_RELEASE_TABLET | Freq: Two times a day (BID) | ORAL | Status: DC
Start: 2024-03-27 — End: 2024-04-01
  Administered 2024-03-27 – 2024-04-01 (×10): 40 mg via ORAL
  Filled 2024-03-27 (×10): qty 1

## 2024-03-27 MED ORDER — MORPHINE SULFATE (PF) 4 MG/ML IV SOLN
4.0000 mg | Freq: Once | INTRAVENOUS | Status: AC
  Administered 2024-03-28: 4 mg via INTRAVENOUS
  Filled 2024-03-27: qty 1

## 2024-03-27 MED ORDER — URSODIOL 300 MG PO CAPS
300.0000 mg | ORAL_CAPSULE | Freq: Two times a day (BID) | ORAL | Status: DC
  Administered 2024-03-28 – 2024-03-29 (×5): 300 mg via ORAL
  Filled 2024-03-27 (×13): qty 1

## 2024-03-27 MED ORDER — ESCITALOPRAM OXALATE 20 MG PO TABS
20.0000 mg | ORAL_TABLET | Freq: Every day | ORAL | Status: DC
  Administered 2024-03-28 – 2024-03-29 (×2): 20 mg via ORAL
  Filled 2024-03-27: qty 2
  Filled 2024-03-27: qty 1

## 2024-03-27 MED ORDER — VALACYCLOVIR HCL 500 MG PO TABS
500.0000 mg | ORAL_TABLET | Freq: Two times a day (BID) | ORAL | Status: DC
  Administered 2024-03-28 – 2024-03-30 (×5): 500 mg via ORAL
  Filled 2024-03-27 (×5): qty 1

## 2024-03-27 MED ORDER — LACTATED RINGERS IV BOLUS
500.0000 mL | Freq: Once | INTRAVENOUS | Status: AC
  Administered 2024-03-27: 500 mL via INTRAVENOUS

## 2024-03-27 MED ORDER — TERBUTALINE SULFATE 1 MG/ML IJ SOLN
0.2500 mg | Freq: Once | INTRAMUSCULAR | Status: AC
  Administered 2024-03-27: 0.25 mg via SUBCUTANEOUS

## 2024-03-27 MED ORDER — TERBUTALINE SULFATE 1 MG/ML IJ SOLN
INTRAMUSCULAR | Status: AC
  Filled 2024-03-27: qty 1

## 2024-03-27 MED ORDER — LACTATED RINGERS IV BOLUS
1000.0000 mL | Freq: Once | INTRAVENOUS | Status: AC
  Administered 2024-03-27: 1000 mL via INTRAVENOUS

## 2024-03-27 MED ORDER — CALCIUM CARBONATE ANTACID 500 MG PO CHEW
2.0000 | CHEWABLE_TABLET | ORAL | Status: DC | PRN
Start: 2024-03-27 — End: 2024-03-29
  Administered 2024-03-27: 400 mg via ORAL
  Filled 2024-03-27 (×2): qty 2

## 2024-03-27 MED ORDER — LACTATED RINGERS IV SOLN: 125.0000 mL/h | INTRAVENOUS | Status: AC

## 2024-03-27 MED ORDER — OXCARBAZEPINE 300 MG PO TABS
600.0000 mg | ORAL_TABLET | Freq: Two times a day (BID) | ORAL | Status: DC
  Administered 2024-03-28 – 2024-04-01 (×10): 600 mg via ORAL
  Filled 2024-03-27 (×13): qty 2

## 2024-03-27 NOTE — MAU Provider Note (Signed)
 Chief Complaint:  Contractions and Rupture of Membranes   HPI   None     Pamela Booker is a 28 y.o. [id number] at [redacted]w[redacted]d who presents to maternity admissions reporting gush of fluid around 645p. She was bouncing on the birthing ball when she noticed a large gush of fluid. She then went to the bathroom and urinated a significant amount per patient. She put on a diaper and came into the MAU but notes no fluid on the pad since having it on. Also note irregular contractions, unsure of the pattern. Denies decreased fetal movement or vaginal bleeding. States she was 1.5cm in the office yesterday.   Pregnancy Course: Follows at [facility], pregnancy complicated by PVCs, s/p vagus nerve stimulation device, epilepsy, hx of pre-eclampsia, thyroid  nodule, gestational hypertension, cholestasis of pregnancy and carpal tunnel syndrome.   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     EAB 11/19.   Started back on continuous nuvaring thereafter  1/20- excessive bleeding. U/s with 3cm EMS. Placed on provera 10mg  bid in addition to nuvaring.  Encourage progesterone  taper. Have full menses at end of 3wks of Nuvaring.   2/20- u/s with resolved thickening of EMS to 4mm  Restart continuous nuvaring   EAB 11/19.   Sta   LGSIL on  Pap smear of cervix 04/04/2022   Formatting of this note might be different from the original. Pap 3/22- LGSIL. 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     Longstanding. Reports occasional tearing and discomfort with sex.   She would like hormones checked.  She states that her estrogen level was low several years ago after a multiyear course of Depo-Provera.  Labs with low estradiol , FSH, and LH. Normal prolactin & TSH.[name]  Discussed increasing her estrogen level by changing to a higher    OB History  Gravida Para Term Preterm AB Living  5 1 1  0  3 1  SAB IAB Ectopic Multiple Live Births  3 0 0 0 1    # Outcome Date GA Lbr Len/2nd Weight Sex Type Anes PTL Lv  5 Current           4 SAB 05/2023          3 SAB 2021     Biochemical     2 SAB 2019          1 Term 10/17/13 [redacted]w[redacted]d  2778 g F Vag-Spont   LIV     Complications: Preeclampsia   Past Surgical History:  Procedure Laterality Date   ABLATION, ENDOMETRIOSIS, ROBOT ASSIST, LAPAROSCOPIC  06/2022   [facility]   CYSTOSCOPY W/ URETERAL STENT REMOVAL Left 01/09/2023   Procedure: CYSTOSCOPY WITH STENT REMOVAL;  Surgeon: Twylla Glendia BROCKS, MD;  Location: [facility];  Service: Urology;  Laterality:  Left;   CYSTOSCOPY/URETEROSCOPY/HOLMIUM LASER/STENT PLACEMENT Left 01/02/2023   Procedure: CYSTOSCOPY/URETEROSCOPY/HOLMIUM LASER/STENT PLACEMENT;  Surgeon: Twylla Glendia BROCKS, MD;  Location: [facility];  Service: Urology;  Laterality: Left;   CYSTOSCOPY/URETEROSCOPY/HOLMIUM LASER/STENT PLACEMENT Right 01/09/2023   Procedure: CYSTOSCOPY/URETEROSCOPY/HOLMIUM LASER/STENT PLACEMENT;  Surgeon: Twylla Glendia BROCKS, MD;  Location: [facility];  Service: Urology;  Laterality: Right;   DILATION AND EVACUATION N/A 05/17/2023   Procedure: DILATATION AND EVACUATION;  Surgeon: Izell Harari, MD;  Location: [facility];  Service: Gynecology;  Laterality: N/A;   LAPAROSCOPY     x5 for endometriosis   LOOP RECORDER INSERTION  2020   LOOP RECORDER REMOVAL  2021   THYROIDECTOMY, PARTIAL     TONSILLECTOMY     URETEROSCOPY Left 01/09/2023   Procedure: URETEROSCOPY;  Surgeon: Twylla Glendia BROCKS, MD;  Location: [facility];  Service: Urology;  Laterality: Left;   VAGUS NERVE STIMULATOR INSERTION  2022   Family History  Adopted: Yes  Problem Relation Age of Onset   Healthy Daughter    Social History   Tobacco Use   Smoking status: Never   Smokeless tobacco: Never  Vaping Use   Vaping status: Former   Substances: Nicotine  Substance Use Topics   Alcohol use: Not Currently    Comment: socially   Drug use: Not Currently    Comment: THC   Allergies  Allergen Reactions   Zolpidem Other (See Comments)    seizure  Other Reaction(s): Other (See Comments)    seizure   Diphenhydramine  Other (See Comments)    Crawling feeling under skin  Crawling feeling under skin    It feels as if I have things crawling on my skin.  It feels as if I have things crawling on my skin.     Crawling feeling under skin    It feels as if I have things crawling on my skin.   Crawling feeling under skin    Crawling feeling under skin  It feels as if I have things crawling on my skin.   Ibuprofen Other (See Comments)     Other reaction(s): GI Upset (intolerance)  Other reaction(s): GI Upset (intolerance) Other reaction(s): GI Upset (intolerance)    Other reaction(s): GI Upset (intolerance)    I have stomach ulcers and I get really bad cramping.  Other reaction(s): GI Upset (intolerance)    I have stomach ulcers and I get really bad cramping.    Other reaction(s): GI Upset (intolerance) Other reaction(s): GI Upset (intolerance)    Other reaction(s): GI Upset (intolerance) Other reaction(s): GI Upset (intolerance)  Other reaction(s): GI Upset (intolerance)  I have stomach ulcers and  I get really bad cramping.   Nsaids Other (See Comments)    History of bleeding ulcers   Compazine  [Prochlorperazine ]    Keppra  [Levetiracetam ] Other (See Comments)    Makes her feel hungover    Phenergan  [Promethazine ]    Emgality [Galcanezumab-Gnlm] Rash   Lorazepam  Itching   Medications Prior to Admission  Medication Sig Dispense Refill Last Dose/Taking   diazepam  (VALIUM ) 5 MG tablet Take 5 mg by mouth 2 (two) times daily.   Past Month   escitalopram  (LEXAPRO ) 20 MG tablet Take 20 mg by mouth daily.   03/27/2024   folic acid (FOLVITE) 1 MG tablet Take 4 mg by mouth daily.   03/27/2024   OXcarbazepine  (TRILEPTAL ) 150 MG tablet Take 4 tablets (600 mg total) by mouth 2 (two) times daily.   03/27/2024   pantoprazole  (PROTONIX ) 40 MG tablet Take 1 tablet (40 mg total) by mouth 2 (two) times daily. 60 tablet 3 03/27/2024   Prenatal Vit-Fe Fumarate-FA (MULTIVITAMIN-PRENATAL) 27-0.8 MG TABS tablet Take 2 tablets by mouth daily at 12 noon.   03/27/2024   pyridoxine  (B-6) 100 MG tablet Take 100 mg by mouth daily.   03/27/2024   sucralfate  (CARAFATE ) 1 GM/10ML suspension Take 10 mLs (1 g total) by mouth daily. Take after evening meal 420 mL 1 03/27/2024   ursodiol  (ACTIGALL ) 500 MG tablet Take 1 tablet (500 mg total) by mouth 2 (two) times daily. 60 tablet 3 03/27/2024   valACYclovir  (VALTREX ) 500 MG tablet Take 1  tablet (500 mg total) by mouth 2 (two) times daily. 60 tablet 6 03/27/2024   cyclobenzaprine  (FLEXERIL ) 10 MG tablet Take 1 tablet (10 mg total) by mouth 2 (two) times daily as needed for muscle spasms. (Patient not taking: Reported on 03/26/2024) 20 tablet 0    Heating Pads PADS 1 Pad by Does not apply route as needed (cramping).      hydrOXYzine (ATARAX) 25 MG tablet Take 1 tablet (25 mg total) by mouth every 6 (six) hours as needed for itching. 30 tablet 2    ondansetron  (ZOFRAN -ODT) 4 MG disintegrating tablet Take 1 tablet (4 mg total) by mouth every 6 (six) hours as needed for nausea. 30 tablet 1 More than a month    I have reviewed patient's Past Medical Hx, Surgical Hx, Family Hx, Social Hx, medications and allergies.   ROS  Pertinent items noted in HPI and remainder of comprehensive ROS otherwise negative.   PHYSICAL EXAM  Patient Vitals for the past 24 hrs:  BP Temp Pulse Resp SpO2 Height Weight  03/27/24 1952 (!) 136/91 -- -- -- -- -- --  03/27/24 1948 -- 98.1 F (36.7 C) 87 18 99 % 5' 2 (1.575 m) 95.3 kg    Constitutional: Well-developed, well-nourished female in no acute distress.  Cardiovascular: normal rate & rhythm, warm and well-perfused Respiratory: normal effort, no problems with respiration noted GI: Abd soft, non-tender, non-distended MS: Extremities nontender, no edema, normal ROM Neurologic: Alert and oriented x 4.  GU: no CVA tenderness Pelvic: normal external female genitalia, stringy white discharge in vaginal vault, pool negative, no blood, cervix clean.      Fetal Tracing: Baseline: Variability: Accelerations:  Decelerations: Toco:    Labs: Results for orders placed or performed during the hospital encounter of 03/27/24 (from the past 24 hours)  Urinalysis, Routine w reflex microscopic -Urine, Clean Catch     Status: Abnormal   Collection Time: 03/27/24  8:05 PM  Result Value Ref Range   Color, Urine  YELLOW YELLOW   APPearance CLEAR CLEAR    Specific Gravity, Urine 1.005 1.005 - 1.030   pH 6.0 5.0 - 8.0   Glucose, UA >=500 (A) NEGATIVE mg/dL   Hgb urine dipstick NEGATIVE NEGATIVE   Bilirubin Urine NEGATIVE NEGATIVE   Ketones, ur NEGATIVE NEGATIVE mg/dL   Protein, ur NEGATIVE NEGATIVE mg/dL   Nitrite NEGATIVE NEGATIVE   Leukocytes,Ua NEGATIVE NEGATIVE   RBC / HPF 0-5 0 - 5 RBC/hpf   WBC, UA 0-5 0 - 5 WBC/hpf   Bacteria, UA RARE (A) NONE SEEN   Squamous Epithelial / HPF 0-5 0 - 5 /HPF  Rupture of Membrane (ROM) Plus     Status: None   Collection Time: 03/27/24  8:25 PM  Result Value Ref Range   Rom Plus NEGATIVE   Glucose, capillary     Status: Abnormal   Collection Time: 03/27/24  9:47 PM  Result Value Ref Range   Glucose-Capillary 103 (H) 70 - 99 mg/dL  Fern Test     Status: Normal   Collection Time: 03/27/24  9:51 PM  Result Value Ref Range   POCT Fern Test Negative = intact amniotic membranes     Imaging:  No results found.  MDM & MAU COURSE  MDM: Moderate  MAU Course: Orders Placed This Encounter  Procedures   Urinalysis, Routine w reflex microscopic -Urine, Clean Catch   Rupture of Membrane (ROM) Plus   Glucose, capillary   Fern Test   Meds ordered this encounter  Medications   pantoprazole  (PROTONIX ) EC tablet 40 mg   VSS. Exam showing stringy white discharge in the vaginal canal. Collected swabs  for fern, wet prep, and ROM plus. Prolonged late deceleration noted after prolonged contraction, resolved with position changes. Will also start IV and give LR bolus. UA showed glucosuria >500, fingerstick glucose 103. Patient states that she has had glucose in her urine since childhood with a history of kidney stones.  10p - Initial fern and ROM plus negative. Lab unable to run initial wet prep. States she had a second gush of fluid, will collect for repeat fern and wet prep. Also requested home heartburn medications, ordered. Will need extended FHT monitoring, plan to admit to [facility].   ASSESSMENT   1.  Long-term current use of benzodiazepine   2. Hyperemesis gravidarum   3. Cholestasis during pregnancy in third trimester     PLAN  Discharge home in stable condition with return precautions.  ***    Allergies as of 03/27/2024       Reactions   Zolpidem Other (See Comments)   seizure Other Reaction(s): Other (See Comments)    seizure   Diphenhydramine  Other (See Comments)   Crawling feeling under skin Crawling feeling under skin    It feels as if I have things crawling on my skin. It feels as if I have things crawling on my skin.     Crawling feeling under skin    It feels as if I have things crawling on my skin.   Crawling feeling under skin    Crawling feeling under skin  It feels as if I have things crawling on my skin.   Ibuprofen Other (See Comments)   Other reaction(s): GI Upset (intolerance) Other reaction(s): GI Upset (intolerance) Other reaction(s): GI Upset (intolerance)    Other reaction(s): GI Upset (intolerance)    I have stomach ulcers and I get really bad cramping. Other reaction(s): GI Upset (intolerance)    I have stomach ulcers and  I get really bad cramping.    Other reaction(s): GI Upset (intolerance) Other reaction(s): GI Upset (intolerance)    Other reaction(s): GI Upset (intolerance) Other reaction(s): GI Upset (intolerance)  Other reaction(s): GI Upset (intolerance)  I have stomach ulcers and I get really bad cramping.   Nsaids Other (See Comments)   History of bleeding ulcers   Compazine  [prochlorperazine ]    Keppra  [levetiracetam ] Other (See Comments)   Makes her feel hungover    Phenergan  [promethazine ]    Emgality [galcanezumab-gnlm] Rash   Lorazepam  Itching     Med Rec must be completed prior to using this SMARTLINK***       Charlie Courts, MD  Family Medicine - Obstetrics Fellow

## 2024-03-27 NOTE — H&P (Addendum)
 Obstetrics Admission History & Physical  03/27/2024 - 11:11 PM Primary OBGYN: [facility]  Chief Complaint: episodic decels with contractions  History of Present Illness  28 y.o. [id number] at [redacted]w[redacted]d, with the above CC. Pregnancy complicated by: seizure d/o with vagal nerve stimulator in place, GHTN, cholestasis, h/o HSV  [name] presented for PTL and SROM evaluation. SROM evaluation negative on fern and speculum exam and unable to run ROM Plus due to too mucus present; she was visually closed on speculum exam.  While in triage, she had a few instances of long contractions with fetal decel with the long contraction and outside of the this the baby is category I with accels. She is overall contracting irregularly and had these decel episodes at 2030 and ?2100.   She was seen on 10/19 for a PTL eval and was 1/60 and felt better after a few doses of morphine . She had a bpp on 10/21 that was reassuring and normal growth on 10/13 with efw 71%, 2410gm,   Review of Systems: as noted in the History of Present Illness.  Patient Active Problem List   Diagnosis Date Noted   Non-reassuring electronic fetal monitoring tracing 03/27/2024   Carpal tunnel syndrome on right 03/26/2024   Gestational hypertension 03/23/2024   Cholestasis during pregnancy in third trimester 02/29/2024   History of lobectomy of thyroid  01/02/2024   Thyroid  nodule 12/04/2023   Nonspecific syndrome suggestive of viral illness 11/16/2023   Hyperemesis gravidarum 10/11/2023   Long-term current use of benzodiazepine 10/08/2023   Supervision of high-risk pregnancy 04/27/2023   Epilepsy (HCC) 07/05/2021   Migraine 07/05/2021   Hx of preeclampsia, prior pregnancy, currently pregnant 07/05/2021   S/P placement of VNS (vagus nerve stimulation) device 05/06/2019   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   PVC (premature ventricular contraction) 03/11/2018   Endometriosis determined by  laparoscopy 03/14/2017     PMHx:  Past Medical History:  Diagnosis Date   Anxiety    Anxiety disorder 07/17/2022   Atypical chest pain 04/25/2016   Bilateral nephrolithiasis    Breakthrough seizure (HCC) 07/05/2021   Chronic pain syndrome 06/11/2023   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     EAB 11/19.   Started back on continuous nuvaring thereafter  1/20- excessive bleeding. U/s with 3cm EMS. Placed on provera 10mg  bid in addition to nuvaring.  Encourage progesterone  taper. Have full menses at end of 3wks of Nuvaring.   2/20- u/s with resolved thickening of EMS to 4mm  Restart continuous nuvaring   EAB 11/19.   Sta   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/04/2022   Formatting of this note might be different from the original. Pap 3/22- LGSIL. Colpo 4/22- CIN 1 Pap 10/23- negative     Migraine headache 07/05/2021   Last Assessment & Plan:     As needed Fioricet  for now     Migraine without aura and responsive to treatment 12/11/2014   Migraines    Nausea & vomiting 11/23/2022  Ovarian cyst    Palpitations    Partial epilepsy with impairment of consciousness (HCC) 06/06/2022   PVC (premature ventricular contraction) 03/11/2018   Seizures (HCC)    last seizure was 2 weeks ago (app.12-18-22)   Shortness of breath 04/25/2016   Status post placement of VNS (vagus nerve stimulation) device    Syncope and collapse 06/10/2019   TB lung, latent 06/02/2023   05/2023: patient states she didn't finish her 9 months of treatment      Thyroid  nodule    Vaginal dryness 06/06/2018   Formatting of this note might be different from the original.     Longstanding. Reports occasional tearing and discomfort with sex.   She would like hormones checked.  She states that her estrogen level was low several years ago after a multiyear course of Depo-Provera.  Labs with low estradiol , FSH, and LH. Normal prolactin & TSH.[name]  Discussed increasing her estrogen level by changing to a higher    PSHx:  Past Surgical History:  Procedure Laterality Date   ABLATION, ENDOMETRIOSIS, ROBOT ASSIST, LAPAROSCOPIC  06/2022   [facility]   CYSTOSCOPY W/ URETERAL STENT REMOVAL Left 01/09/2023   Procedure: CYSTOSCOPY WITH STENT REMOVAL;  Surgeon: Twylla Glendia BROCKS, MD;  Location: [facility];  Service: Urology;  Laterality: Left;   CYSTOSCOPY/URETEROSCOPY/HOLMIUM LASER/STENT PLACEMENT Left 01/02/2023   Procedure: CYSTOSCOPY/URETEROSCOPY/HOLMIUM LASER/STENT PLACEMENT;  Surgeon: Twylla Glendia BROCKS, MD;  Location: [facility];  Service: Urology;  Laterality: Left;   CYSTOSCOPY/URETEROSCOPY/HOLMIUM LASER/STENT PLACEMENT Right 01/09/2023   Procedure: CYSTOSCOPY/URETEROSCOPY/HOLMIUM LASER/STENT PLACEMENT;  Surgeon: Twylla Glendia BROCKS, MD;  Location: [facility];  Service: Urology;  Laterality: Right;   DILATION AND EVACUATION N/A 05/17/2023   Procedure: DILATATION AND EVACUATION;  Surgeon: Izell Harari, MD;  Location: [facility];  Service: Gynecology;  Laterality: N/A;   LAPAROSCOPY     x5 for endometriosis   LOOP RECORDER INSERTION  2020   LOOP RECORDER REMOVAL  2021   THYROIDECTOMY, PARTIAL     TONSILLECTOMY     URETEROSCOPY Left 01/09/2023   Procedure: URETEROSCOPY;  Surgeon: Twylla Glendia BROCKS, MD;  Location: [facility];  Service: Urology;  Laterality: Left;   VAGUS NERVE STIMULATOR INSERTION  2022   Medications:  Medications Prior to Admission  Medication Sig Dispense Refill Last Dose/Taking   diazepam  (VALIUM ) 5 MG tablet Take 5 mg by mouth 2 (two) times daily.   Past  Month   escitalopram  (LEXAPRO ) 20 MG tablet Take 20 mg by mouth daily.   03/27/2024   folic acid (FOLVITE) 1 MG tablet Take 4 mg by mouth daily.   03/27/2024   OXcarbazepine  (TRILEPTAL ) 150 MG tablet Take 4 tablets (600 mg total) by mouth 2 (two) times daily.   03/27/2024   pantoprazole  (PROTONIX ) 40 MG tablet Take 1 tablet (40 mg total) by mouth 2 (two) times daily. 60 tablet 3 03/27/2024   Prenatal Vit-Fe Fumarate-FA (MULTIVITAMIN-PRENATAL) 27-0.8 MG TABS tablet Take 2 tablets by mouth daily at 12 noon.   03/27/2024   pyridoxine  (B-6) 100 MG tablet Take 100 mg by mouth daily.   03/27/2024   sucralfate  (CARAFATE ) 1 GM/10ML suspension Take 10 mLs (1 g total) by mouth daily. Take after evening meal 420 mL 1 03/27/2024   ursodiol  (ACTIGALL ) 500 MG tablet Take 1 tablet (500 mg total) by mouth 2 (two) times daily. 60 tablet 3 03/27/2024   valACYclovir  (VALTREX ) 500 MG tablet Take 1 tablet (500 mg total) by mouth 2 (two) times daily. 60 tablet 6 03/27/2024  cyclobenzaprine  (FLEXERIL ) 10 MG tablet Take 1 tablet (10 mg total) by mouth 2 (two) times daily as needed for muscle spasms. (Patient not taking: Reported on 03/26/2024) 20 tablet 0    Heating Pads PADS 1 Pad by Does not apply route as needed (cramping).      hydrOXYzine (ATARAX) 25 MG tablet Take 1 tablet (25 mg total) by mouth every 6 (six) hours as needed for itching. 30 tablet 2    ondansetron  (ZOFRAN -ODT) 4 MG disintegrating tablet Take 1 tablet (4 mg total) by mouth every 6 (six) hours as needed for nausea. 30 tablet 1 More than a month     Allergies: is allergic to zolpidem, diphenhydramine , ibuprofen, nsaids, compazine  [prochlorperazine ], keppra  [levetiracetam ], phenergan  [promethazine ], emgality [galcanezumab-gnlm], and lorazepam . OBHx:  OB History  Gravida Para Term Preterm AB Living  5 1 1  0 3 1  SAB IAB Ectopic Multiple Live Births  3 0 0 0 1    # Outcome Date GA Lbr Len/2nd Weight Sex Type Anes PTL Lv  5 Current           4  SAB 05/2023          3 SAB 2021     Biochemical     2 SAB 2019          1 Term 10/17/13 [redacted]w[redacted]d  2778 g F Vag-Spont   LIV     Complications: Preeclampsia        FHx:  Family History  Adopted: Yes  Problem Relation Age of Onset   Healthy Daughter    Soc Hx:  Social History   Socioeconomic History   Marital status: Married    Spouse name: Not on file   Number of children: 1   Years of education: Not on file   Highest education level: Some college, no degree  Occupational History   Not on file  Tobacco Use   Smoking status: Never   Smokeless tobacco: Never  Vaping Use   Vaping status: Former   Substances: Nicotine  Substance and Sexual Activity   Alcohol use: Not Currently    Comment: socially   Drug use: Not Currently    Comment: THC   Sexual activity: Yes    Birth control/protection: None  Other Topics Concern   Not on file  Social History Narrative   Pt lives alone with daughter in 2 story home   Right handed   Pt drinks coffee everyday, sometime tea at night, soda-coke zero not often   exercise 3-4 times a week          Social Drivers of Corporate investment banker Strain: Not on file  Food Insecurity: No Food Insecurity (02/10/2024)   Hunger Vital Sign    Worried About Running Out of Food in the Last Year: Never true    Ran Out of Food in the Last Year: Never true  Transportation Needs: No Transportation Needs (02/10/2024)   PRAPARE - Administrator, Civil Service (Medical): No    Lack of Transportation (Non-Medical): No  Physical Activity: Not on file  Stress: Not on file  Social Connections: Not on file  Intimate Partner Violence: Not At Risk (02/10/2024)   Humiliation, Afraid, Rape, and Kick questionnaire    Fear of Current or Ex-Partner: No    Emotionally Abused: No    Physically Abused: No    Sexually Abused: No    Objective    Current Vital Signs 24h Vital Sign Ranges  T 98.1 F (36.7 C) Temp  Avg: 98.1 F (36.7 C)  Min: 98.1 F  (36.7 C)  Max: 98.1 F (36.7 C)  BP (!) 136/91 BP  Min: 136/91  Max: 136/91  HR 87 Pulse  Avg: 87  Min: 87  Max: 87  RR 18 Resp  Avg: 18  Min: 18  Max: 18  SaO2 99 %   SpO2  Avg: 99 %  Min: 99 %  Max: 99 %       24 Hour I/O Current Shift I/O  Time Ins Outs No intake/output data recorded. No intake/output data recorded.    General: Well nourished, well developed female in no acute distress.  Skin:  Warm and dry.  Cardiovascular: S1, S2 normal, no murmur, rub or gallop, regular rate and rhythm Respiratory:  Clear to auscultation bilateral. Normal respiratory effort Abdomen: gravid, nttp, soft Neuro/Psych:  Normal mood and affect.   SVE: when I was in room d/w her re: o/n obs, she had a long contraction (2300) with fetal decel to 70s with decel for 67m and overall contraction lasting about 30m.  I checked her during this and she was the same as a few days ago: 1/50/high/bow felt, no blood on glove. Terbutaline x 1 given  Labs  Recent Labs  Lab 03/23/24 1815  WBC 10.5  HGB 11.5*  HCT 35.1*  PLT 263    Recent Labs  Lab 03/23/24 1815 03/26/24 1623  NA 134* 137  K 3.5 4.1  CL 99 102  CO2 21* 20  BUN 5* 6  CREATININE 0.56 0.56*  CALCIUM 10.6* 9.8  PROT 6.4* 5.9*  BILITOT 0.2 0.2  ALKPHOS 218* 262*  ALT 17 15  AST 24 22  GLUCOSE 110* 101*   Radiology As per HPI  Assessment & Plan   28 y.o. [id number] at [redacted]w[redacted]d with episodes of long contraction and decel; pt stable  It doesn't appear that she is PTL but she is uncomfortable with contractions. Her contraction pattern doesn't appear to be c/w abruption. Last PO at 1800. I told her to stay NPO, MIVF (she is s/p 1.5L bolus), and will check KB along with basic admit labs. I'll also repeat u/s and check another bpp and check placenta. D/w NICU.  One dose of morphine  4mg  ordered  Neuro: continue home meds, which were confirmed with her. Partner has VNS magnet and has used prophylactic ally while in triage as s/s can be  precipitated by discomfort Cholestasis: continue actigall  HSV: continue valtrex  ppx GHTN: no issues on no meds  Bebe Izell Raddle MD Attending [facility] GYN Consult Phone: [phone] (M-F, [phone]) & [phone] (Off hours, weekends, holidays)

## 2024-03-27 NOTE — MAU Note (Signed)
 McKenzie RN CN in NICU notified of pt's admission. She is unsure if Dr Izell spoke with the Neo but will let Neo know if they would like to call Dr Izell since NICU is red

## 2024-03-27 NOTE — MAU Note (Signed)
 Pamela Booker is a 28 y.o. at [redacted]w[redacted]d here in MAU reporting gush of fld at 39 that was clear. Unsure if has leaked anymore. Having pelvic pressure. Having some abdominal tightening but not painful. Reports good FM and no VB.   LMP: na Onset of complaint: 1845 Pain score: 0 Vitals:   03/27/24 1948 03/27/24 1952  BP:  (!) 136/91  Pulse: 87   Resp: 18   Temp: 98.1 F (36.7 C)   SpO2: 99%      FHT: 145  Lab orders placed from triage: u/a

## 2024-03-27 NOTE — Progress Notes (Signed)
 Patient was assessed and managed by nursing staff during this encounter. I have reviewed the chart and agree with the documentation and plan. I have also made any necessary editorial changes.  Lynwood Solomons, MD   04/01/2024 7:35 AM PT to u/s and then will go to 112 in [facility]

## 2024-03-27 NOTE — MAU Note (Signed)
 At 2027 pt had a ctxs and FHR deceled. FHTS were 90-110 for and then returned to baseline. Pt initially turned to R side and then to the L. Dr Jomarie aware and came to pt's bedside. IVFs started and bolus given

## 2024-03-27 NOTE — MAU Note (Signed)
 Dr Jomarie and Dr Izell aware of FHR strip and strip reviewed by providers. Will admit to [facility]

## 2024-03-27 NOTE — Progress Notes (Signed)
 At 2109 pt had a 3mins ctx and FHR decel. Pt had moved back to semifowlers. She was turned back to her left side. FHTs varied from 90s to 120s over and then returned to 135 baseline. Dr Jomarie aware

## 2024-03-28 ENCOUNTER — Other Ambulatory Visit: Payer: Self-pay

## 2024-03-28 ENCOUNTER — Ambulatory Visit: Admitting: Cardiology

## 2024-03-28 DIAGNOSIS — O368330 Maternal care for abnormalities of the fetal heart rate or rhythm, third trimester, not applicable or unspecified: Secondary | ICD-10-CM | POA: Diagnosis not present

## 2024-03-28 DIAGNOSIS — Z3A35 35 weeks gestation of pregnancy: Secondary | ICD-10-CM

## 2024-03-28 LAB — KLEIHAUER-BETKE STAIN
Fetal Cells %: 0 %
Quantitation Fetal Hemoglobin: 0 mL

## 2024-03-28 LAB — TYPE AND SCREEN: ABO/RH(D): O POS

## 2024-03-28 MED ORDER — MORPHINE SULFATE (PF) 2 MG/ML IV SOLN
1.0000 mg | INTRAVENOUS | Status: DC | PRN
  Administered 2024-03-28: 1 mg via INTRAVENOUS
  Filled 2024-03-28: qty 1

## 2024-03-28 MED ORDER — NIFEDIPINE ER OSMOTIC RELEASE 30 MG PO TB24
30.0000 mg | ORAL_TABLET | Freq: Every day | ORAL | Status: DC
  Administered 2024-03-28: 30 mg via ORAL
  Filled 2024-03-28: qty 1

## 2024-03-28 MED ORDER — SUCRALFATE 1 GM/10ML PO SUSP
1.0000 g | Freq: Every day | ORAL | Status: DC
  Administered 2024-03-28 (×2): 1 g via ORAL
  Filled 2024-03-28 (×3): qty 10

## 2024-03-28 MED ORDER — ACETAMINOPHEN 325 MG PO TABS
650.0000 mg | ORAL_TABLET | ORAL | Status: DC | PRN
  Administered 2024-03-28 – 2024-03-29 (×2): 650 mg via ORAL
  Filled 2024-03-28 (×2): qty 2

## 2024-03-28 MED ORDER — MORPHINE SULFATE (PF) 2 MG/ML IV SOLN
2.0000 mg | INTRAVENOUS | Status: DC | PRN
  Administered 2024-03-28: 2 mg via INTRAVENOUS
  Filled 2024-03-28: qty 1

## 2024-03-28 NOTE — Telephone Encounter (Signed)
 [employer] Employee, Ronal, left voicemail for lab alert..  Reference #[id number].  Per chart review, provider spoke with pt about result.    Waddell, RN

## 2024-03-28 NOTE — Progress Notes (Signed)
 FACULTY PRACTICE ANTEPARTUM PROGRESS NOTE  Pamela Booker is a 28 y.o. [phone] at [redacted]w[redacted]d who is admitted for prolonged monitoring due to intermittent fetal decels.  Estimated Date of Delivery: 05/02/24 Fetal presentation is cephalic.  Length of Stay:  0 Days. Admitted 03/27/2024  Subjective: Pt is comfortable, no new complaints Patient reports normal fetal movement.  She denies uterine contractions, denies bleeding and leaking of fluid per vagina.  Vitals:  Blood pressure 133/72, pulse 78, temperature 98.2 F (36.8 C), temperature source Oral, resp. rate 17, height 5' 2 (1.575 m), weight 95.3 kg, last menstrual period 07/27/2023, SpO2 99%. Physical Examination: CONSTITUTIONAL: Well-developed, well-nourished female in no acute distress.  HENT:  Normocephalic, atraumatic, External right and left ear normal. Oropharynx is clear and moist EYES: Conjunctivae and EOM are normal.  NECK: Normal range of motion, supple, no masses. SKIN: Skin is warm and dry. No rash noted. Not diaphoretic. No erythema. No pallor. NEUROLGIC: Alert and oriented to person, place, and time. Normal reflexes, muscle tone coordination. No cranial nerve deficit noted. PSYCHIATRIC: Normal mood and affect. Normal behavior. Normal judgment and thought content. CARDIOVASCULAR: Normal heart rate noted, regular rhythm RESPIRATORY: Effort and breath sounds normal, no problems with respiration noted MUSCULOSKELETAL: Normal range of motion. No edema and no tenderness. ABDOMEN: Soft, nontender, nondistended, gravid. CERVIX: deferred  Fetal monitoring: FHR: 130s bpm, Variability: marked, Accelerations: Present, Decelerations: Absent  Uterine activity: irritability   Results for orders placed or performed during the hospital encounter of 03/27/24 (from the past 48 hours)  Urinalysis, Routine w reflex microscopic -Urine, Clean Catch     Status: Abnormal   Collection Time: 03/27/24  8:05 PM  Result Value Ref Range    Color, Urine YELLOW YELLOW   APPearance CLEAR CLEAR   Specific Gravity, Urine 1.005 1.005 - 1.030   pH 6.0 5.0 - 8.0   Glucose, UA >=500 (A) NEGATIVE mg/dL   Hgb urine dipstick NEGATIVE NEGATIVE   Bilirubin Urine NEGATIVE NEGATIVE   Ketones, ur NEGATIVE NEGATIVE mg/dL   Protein, ur NEGATIVE NEGATIVE mg/dL   Nitrite NEGATIVE NEGATIVE   Leukocytes,Ua NEGATIVE NEGATIVE   RBC / HPF 0-5 0 - 5 RBC/hpf   WBC, UA 0-5 0 - 5 WBC/hpf   Bacteria, UA RARE (A) NONE SEEN   Squamous Epithelial / HPF 0-5 0 - 5 /HPF    Comment: Performed at [facility], [address], [place], [place] [address]  Rupture of Membrane (ROM) Plus     Status: None   Collection Time: 03/27/24  8:25 PM  Result Value Ref Range   Rom Plus NEGATIVE     Comment: Performed at [facility], [address], [place], [place] [address]  Type and screen [facility]     Status: None   Collection Time: 03/27/24  8:35 PM  Result Value Ref Range   ABO/RH(D) O POS    Antibody Screen NEG    Sample Expiration      03/30/2024,2359 Performed at [facility], [address], [place], [place] [address]   CBC     Status: Abnormal   Collection Time: 03/27/24  8:35 PM  Result Value Ref Range   WBC 9.6 4.0 - 10.5 K/uL   RBC 3.57 (L) 3.87 - 5.11 MIL/uL   Hemoglobin 10.4 (L) 12.0 - 15.0 g/dL   HCT 67.5 (L) 63.9 - 53.9 %   MCV 90.8 80.0 - 100.0 fL   MCH 29.1 26.0 - 34.0  pg   MCHC 32.1 30.0 - 36.0 g/dL   RDW 86.7 88.4 - 84.4 %   Platelets 233 150 - 400 K/uL   nRBC 0.4 (H) 0.0 - 0.2 %    Comment: Performed at [facility], [address], [place], [place] [address]  Kleihauer-Betke stain     Status: None   Collection Time: 03/27/24  8:35 PM  Result Value Ref Range   Fetal Cells % 0 %   Quantitation Fetal Hemoglobin 0.0000 mL    Comment:  up to 15 mLs   # Vials RhIg NOT INDICATED     Comment: Performed at [facility], [address] [place], [place] [address]  Glucose, capillary      Status: Abnormal   Collection Time: 03/27/24  9:47 PM  Result Value Ref Range   Glucose-Capillary 103 (H) 70 - 99 mg/dL    Comment: Glucose reference range applies only to samples taken after fasting for at least 8 hours.  Fern Test     Status: Normal   Collection Time: 03/27/24  9:51 PM  Result Value Ref Range   POCT Fern Test Negative = intact amniotic membranes   Wet prep, genital     Status: Abnormal   Collection Time: 03/27/24 10:35 PM   Specimen: Vaginal  Result Value Ref Range   Yeast Wet Prep HPF POC NONE SEEN NONE SEEN   Trich, Wet Prep NONE SEEN NONE SEEN   Clue Cells Wet Prep HPF POC PRESENT (A) NONE SEEN   WBC, Wet Prep HPF POC >=10 (A) <10   Sperm NONE SEEN     Comment: Performed at [facility], [address], [place], [place] [address]  Odetta Test     Status: None   Collection Time: 03/27/24 10:53 PM  Result Value Ref Range   POCT Fern Test Negative = intact amniotic membranes     I have reviewed the patient's current medications.  ASSESSMENT: Principal Problem:   Non-reassuring electronic fetal monitoring tracing Active Problems:   Endometriosis determined by laparoscopy   HSV-2 infection   PLAN: Intermittent fetal decelerations Most decelerations were in association with prolonged tetanic contractions Pt has been given procardia 30 XL to ward off contractions.   Strip is currently category 1 with marked variability Continue monitoring and anticipate discharge in AM if no further decels.     Continue routine antenatal care.   Jerilynn Buddle, MD [facility] Faculty Attending, [facility] 03/28/2024 4:55 PM

## 2024-03-28 NOTE — Progress Notes (Signed)
 Patient refuses procardia at this time. States she has done research and the medication isnt being used for that anymore'. Attempt made to educate patient but she would prefer to hear it from the doctor.

## 2024-03-28 NOTE — Plan of Care (Signed)
  Problem: Education: Goal: Knowledge of disease or condition will improve Outcome: Progressing Goal: Knowledge of the prescribed therapeutic regimen will improve Outcome: Progressing Goal: Individualized Educational Video(s) Outcome: Progressing   Problem: Clinical Measurements: Goal: Complications related to the disease process, condition or treatment will be avoided or minimized Outcome: Progressing   Problem: Education: Goal: Knowledge of General Education information will improve Description: Including pain rating scale, medication(s)/side effects and non-pharmacologic comfort measures Outcome: Progressing   Problem: Health Behavior/Discharge Planning: Goal: Ability to manage health-related needs will improve Outcome: Progressing   Problem: Clinical Measurements: Goal: Ability to maintain clinical measurements within normal limits will improve Outcome: Progressing Goal: Will remain free from infection Outcome: Progressing Goal: Diagnostic test results will improve Outcome: Progressing Goal: Respiratory complications will improve Outcome: Progressing Goal: Cardiovascular complication will be avoided Outcome: Progressing   Problem: Activity: Goal: Risk for activity intolerance will decrease Outcome: Progressing   Problem: Nutrition: Goal: Adequate nutrition will be maintained Outcome: Progressing   Problem: Coping: Goal: Level of anxiety will decrease Outcome: Progressing   Problem: Elimination: Goal: Will not experience complications related to bowel motility Outcome: Progressing Goal: Will not experience complications related to urinary retention Outcome: Progressing   Problem: Pain Managment: Goal: General experience of comfort will improve and/or be controlled Outcome: Progressing   Problem: Safety: Goal: Ability to remain free from injury will improve Outcome: Progressing   Problem: Skin Integrity: Goal: Risk for impaired skin integrity will  decrease Outcome: Progressing

## 2024-03-28 NOTE — Progress Notes (Signed)
 Patient wants to see Dr on call for discharge. Spoke to Dr. Zina and he will be around to see her shortly.

## 2024-03-29 ENCOUNTER — Inpatient Hospital Stay (HOSPITAL_COMMUNITY)

## 2024-03-29 DIAGNOSIS — A6000 Herpesviral infection of urogenital system, unspecified: Secondary | ICD-10-CM | POA: Diagnosis present

## 2024-03-29 DIAGNOSIS — O2662 Liver and biliary tract disorders in childbirth: Secondary | ICD-10-CM | POA: Diagnosis not present

## 2024-03-29 DIAGNOSIS — Z79899 Other long term (current) drug therapy: Secondary | ICD-10-CM | POA: Diagnosis not present

## 2024-03-29 DIAGNOSIS — Z3A35 35 weeks gestation of pregnancy: Secondary | ICD-10-CM | POA: Diagnosis not present

## 2024-03-29 DIAGNOSIS — O42013 Preterm premature rupture of membranes, onset of labor within 24 hours of rupture, third trimester: Secondary | ICD-10-CM | POA: Diagnosis not present

## 2024-03-29 DIAGNOSIS — O9832 Other infections with a predominantly sexual mode of transmission complicating childbirth: Secondary | ICD-10-CM | POA: Diagnosis present

## 2024-03-29 DIAGNOSIS — G894 Chronic pain syndrome: Secondary | ICD-10-CM | POA: Diagnosis present

## 2024-03-29 DIAGNOSIS — O26643 Intrahepatic cholestasis of pregnancy, third trimester: Secondary | ICD-10-CM | POA: Diagnosis present

## 2024-03-29 DIAGNOSIS — O134 Gestational [pregnancy-induced] hypertension without significant proteinuria, complicating childbirth: Secondary | ICD-10-CM | POA: Diagnosis present

## 2024-03-29 DIAGNOSIS — G40909 Epilepsy, unspecified, not intractable, without status epilepticus: Secondary | ICD-10-CM | POA: Diagnosis present

## 2024-03-29 DIAGNOSIS — O99354 Diseases of the nervous system complicating childbirth: Secondary | ICD-10-CM | POA: Diagnosis present

## 2024-03-29 LAB — COMPREHENSIVE METABOLIC PANEL WITH GFR
ALT: 31 U/L (ref 0–44)
AST: 37 U/L (ref 15–41)
Albumin: 2.5 g/dL — ABNORMAL LOW (ref 3.5–5.0)
Alkaline Phosphatase: 225 U/L — ABNORMAL HIGH (ref 38–126)
Anion gap: 8 (ref 5–15)
BUN: 6 mg/dL (ref 6–20)
CO2: 22 mmol/L (ref 22–32)
Calcium: 8.9 mg/dL (ref 8.9–10.3)
Chloride: 105 mmol/L (ref 98–111)
Creatinine, Ser: 0.72 mg/dL (ref 0.44–1.00)
GFR, Estimated: >60 mL/min (ref >60)
Glucose, Bld: 117 mg/dL — ABNORMAL HIGH (ref 70–99)
Potassium: 3.6 mmol/L (ref 3.5–5.1)
Sodium: 135 mmol/L (ref 135–145)
Total Bilirubin: 0.4 mg/dL (ref 0.0–1.2)
Total Protein: 5.8 g/dL — ABNORMAL LOW (ref 6.5–8.1)

## 2024-03-29 LAB — CBC
HCT: 31.6 % — ABNORMAL LOW (ref 36.0–46.0)
Hemoglobin: 10.1 g/dL — ABNORMAL LOW (ref 12.0–15.0)
MCH: 28.7 pg (ref 26.0–34.0)
MCHC: 32 g/dL (ref 30.0–36.0)
MCV: 89.8 fL (ref 80.0–100.0)
Platelets: 240 K/uL (ref 150–400)
RBC: 3.52 MIL/uL — ABNORMAL LOW (ref 3.87–5.11)
RDW: 13.5 % (ref 11.5–15.5)
WBC: 7.5 K/uL (ref 4.0–10.5)
nRBC: 0.8 % — ABNORMAL HIGH (ref 0.0–0.2)

## 2024-03-29 LAB — GROUP B STREP BY PCR: Group B strep by PCR: NEGATIVE

## 2024-03-29 MED ORDER — PHENYLEPHRINE 80 MCG/ML (10ML) SYRINGE FOR IV PUSH (FOR BLOOD PRESSURE SUPPORT): 80.0000 ug | PREFILLED_SYRINGE | INTRAVENOUS | Status: DC | PRN

## 2024-03-29 MED ORDER — LACTATED RINGERS IV SOLN
500.0000 mL | Freq: Once | INTRAVENOUS | Status: AC
  Administered 2024-03-29: 500 mL via INTRAVENOUS

## 2024-03-29 MED ORDER — OXYTOCIN BOLUS FROM INFUSION
333.0000 mL | Freq: Once | INTRAVENOUS | Status: AC
  Administered 2024-03-30: 333 mL via INTRAVENOUS

## 2024-03-29 MED ORDER — LACTATED RINGERS IV SOLN: 500.0000 mL | INTRAVENOUS | Status: DC | PRN

## 2024-03-29 MED ORDER — ACETAMINOPHEN-CAFFEINE 500-65 MG PO TABS
2.0000 | ORAL_TABLET | Freq: Four times a day (QID) | ORAL | Status: DC | PRN
  Administered 2024-03-29: 2 via ORAL
  Filled 2024-03-29 (×2): qty 2

## 2024-03-29 MED ORDER — TERBUTALINE SULFATE 1 MG/ML IJ SOLN: 0.2500 mg | Freq: Once | INTRAMUSCULAR | Status: DC | PRN

## 2024-03-29 MED ORDER — EPHEDRINE 5 MG/ML INJ: 10.0000 mg | INTRAVENOUS | Status: DC | PRN

## 2024-03-29 MED ORDER — ONDANSETRON HCL 4 MG/2ML IJ SOLN
4.0000 mg | Freq: Four times a day (QID) | INTRAMUSCULAR | Status: DC | PRN
  Administered 2024-03-30: 4 mg via INTRAVENOUS
  Filled 2024-03-29: qty 2

## 2024-03-29 MED ORDER — ACETAMINOPHEN 325 MG PO TABS: 650.0000 mg | ORAL_TABLET | ORAL | Status: DC | PRN

## 2024-03-29 MED ORDER — OXYCODONE-ACETAMINOPHEN 5-325 MG PO TABS: 1.0000 | ORAL_TABLET | ORAL | Status: DC | PRN

## 2024-03-29 MED ORDER — LIDOCAINE HCL (PF) 1 % IJ SOLN: 30.0000 mL | INTRAMUSCULAR | Status: DC | PRN

## 2024-03-29 MED ORDER — FENTANYL-BUPIVACAINE-NACL 0.5-0.125-0.9 MG/250ML-% EP SOLN
12.0000 mL/h | EPIDURAL | Status: DC | PRN
  Administered 2024-03-29: 12 mL/h via EPIDURAL
  Filled 2024-03-29: qty 250

## 2024-03-29 MED ORDER — OXYTOCIN-SODIUM CHLORIDE 30-0.9 UT/500ML-% IV SOLN
1.0000 m[IU]/min | INTRAVENOUS | Status: DC
  Administered 2024-03-29: 1 m[IU]/min via INTRAVENOUS
  Filled 2024-03-29: qty 500

## 2024-03-29 MED ORDER — OXYTOCIN-SODIUM CHLORIDE 30-0.9 UT/500ML-% IV SOLN: 1.0000 m[IU]/min | INTRAVENOUS | Status: DC

## 2024-03-29 MED ORDER — OXYTOCIN-SODIUM CHLORIDE 30-0.9 UT/500ML-% IV SOLN: 2.5000 [IU]/h | INTRAVENOUS | Status: DC

## 2024-03-29 MED ORDER — LACTATED RINGERS IV SOLN: INTRAVENOUS | Status: DC

## 2024-03-29 MED ORDER — SODIUM CHLORIDE 0.9 % IV SOLN
5.0000 10*6.[IU] | Freq: Once | INTRAVENOUS | Status: AC
  Administered 2024-03-29: 5 10*6.[IU] via INTRAVENOUS
  Filled 2024-03-29: qty 5

## 2024-03-29 MED ORDER — LIDOCAINE HCL (PF) 1 % IJ SOLN
INTRAMUSCULAR | Status: DC | PRN
  Administered 2024-03-29 (×2): 5 mL via EPIDURAL

## 2024-03-29 MED ORDER — SOD CITRATE-CITRIC ACID 500-334 MG/5ML PO SOLN: 30.0000 mL | ORAL | Status: DC | PRN

## 2024-03-29 MED ORDER — NIFEDIPINE ER OSMOTIC RELEASE 30 MG PO TB24
30.0000 mg | ORAL_TABLET | Freq: Two times a day (BID) | ORAL | Status: DC
  Administered 2024-03-29: 30 mg via ORAL
  Filled 2024-03-29 (×3): qty 1

## 2024-03-29 MED ORDER — FENTANYL CITRATE (PF) 100 MCG/2ML IJ SOLN
50.0000 ug | INTRAMUSCULAR | Status: DC | PRN
  Administered 2024-03-29: 50 ug via INTRAVENOUS
  Filled 2024-03-29: qty 2

## 2024-03-29 MED ORDER — PENICILLIN G POT IN DEXTROSE 60000 UNIT/ML IV SOLN
3.0000 10*6.[IU] | INTRAVENOUS | Status: DC
  Administered 2024-03-30 (×3): 3 10*6.[IU] via INTRAVENOUS
  Filled 2024-03-29 (×3): qty 50

## 2024-03-29 MED ORDER — CYCLOBENZAPRINE HCL 5 MG PO TABS
10.0000 mg | ORAL_TABLET | Freq: Once | ORAL | Status: AC
  Administered 2024-03-29: 10 mg via ORAL
  Filled 2024-03-29: qty 2

## 2024-03-29 MED ORDER — FAMOTIDINE IN NACL 20-0.9 MG/50ML-% IV SOLN
20.0000 mg | Freq: Once | INTRAVENOUS | Status: AC
  Administered 2024-03-29: 20 mg via INTRAVENOUS
  Filled 2024-03-29: qty 50

## 2024-03-29 NOTE — Anesthesia Procedure Notes (Signed)
 Epidural Patient location during procedure: OB Start time: 03/29/2024 10:25 PM End time: 03/29/2024 10:34 PM  Staffing Anesthesiologist: Erma Thom SAUNDERS, MD Performed: anesthesiologist   Preanesthetic Checklist Completed: patient identified, IV checked, risks and benefits discussed, monitors and equipment checked, pre-op evaluation and timeout performed  Epidural Patient position: sitting Prep: DuraPrep Patient monitoring: heart rate, cardiac monitor, continuous pulse ox and blood pressure Approach: midline Location: L3-L4 Injection technique: LOR air  Needle:  Needle type: Tuohy  Needle gauge: 17 G Needle length: 9 cm Needle insertion depth: 5 cm Catheter type: closed end flexible Catheter size: 19 Gauge Catheter at skin depth: 10 cm Test dose: negative  Assessment Sensory level: T8  Additional Notes Patient identified. Risks/Benefits/Options discussed with patient including but not limited to bleeding, infection, nerve damage, paralysis, failed block, incomplete pain control, headache, blood pressure changes, nausea, vomiting, reactions to medication both or allergic, itching and postpartum back pain. Confirmed with bedside nurse the patient's most recent platelet count. Confirmed with patient that they are not currently taking any anticoagulation, have any bleeding history or any family history of bleeding disorders. Patient expressed understanding and wished to proceed. All questions were answered. Sterile technique was used throughout the entire procedure. Please see nursing notes for vital signs. Test dose was given through epidural catheter and negative prior to continuing to dose epidural or start infusion. Warning signs of high block given to the patient including shortness of breath, tingling/numbness in hands, complete motor block, or any concerning symptoms with instructions to call for help. Patient was given instructions on fall risk and not to get out of bed. All  questions and concerns addressed with instructions to call with any issues or inadequate analgesia.  Reason for block:procedure for pain

## 2024-03-29 NOTE — Plan of Care (Signed)
  Problem: Education: Goal: Knowledge of disease or condition will improve 03/29/2024 1026 by Knute Suzen RAMAN, RN Outcome: Progressing 03/29/2024 0941 by Knute Suzen RAMAN, RN Outcome: Progressing Goal: Knowledge of the prescribed therapeutic regimen will improve 03/29/2024 1026 by Knute Suzen RAMAN, RN Outcome: Progressing 03/29/2024 0941 by Knute Suzen RAMAN, RN Outcome: Progressing Goal: Individualized Educational Video(s) 03/29/2024 1026 by Knute Suzen RAMAN, RN Outcome: Progressing 03/29/2024 0941 by Knute Suzen RAMAN, RN Outcome: Progressing   Problem: Clinical Measurements: Goal: Complications related to the disease process, condition or treatment will be avoided or minimized 03/29/2024 1026 by Knute Suzen RAMAN, RN Outcome: Progressing 03/29/2024 0941 by Knute Suzen RAMAN, RN Outcome: Progressing   Problem: Education: Goal: Knowledge of General Education information will improve Description: Including pain rating scale, medication(s)/side effects and non-pharmacologic comfort measures 03/29/2024 1026 by Knute Suzen RAMAN, RN Outcome: Progressing 03/29/2024 0941 by Knute Suzen RAMAN, RN Outcome: Progressing   Problem: Health Behavior/Discharge Planning: Goal: Ability to manage health-related needs will improve 03/29/2024 1026 by Knute Suzen RAMAN, RN Outcome: Progressing 03/29/2024 0941 by Knute Suzen RAMAN, RN Outcome: Progressing   Problem: Clinical Measurements: Goal: Ability to maintain clinical measurements within normal limits will improve 03/29/2024 1026 by Knute Suzen RAMAN, RN Outcome: Progressing 03/29/2024 0941 by Knute Suzen RAMAN, RN Outcome: Progressing Goal: Will remain free from infection 03/29/2024 1026 by Knute Suzen RAMAN, RN Outcome: Progressing 03/29/2024 0941 by Knute Suzen RAMAN, RN Outcome: Progressing Goal: Diagnostic test results will improve 03/29/2024 1026 by Knute Suzen RAMAN, RN Outcome:  Progressing 03/29/2024 0941 by Knute Suzen RAMAN, RN Outcome: Progressing Goal: Respiratory complications will improve 03/29/2024 1026 by Knute Suzen RAMAN, RN Outcome: Progressing 03/29/2024 0941 by Knute Suzen RAMAN, RN Outcome: Progressing Goal: Cardiovascular complication will be avoided 03/29/2024 1026 by Knute Suzen RAMAN, RN Outcome: Progressing 03/29/2024 0941 by Knute Suzen RAMAN, RN Outcome: Progressing   Problem: Activity: Goal: Risk for activity intolerance will decrease 03/29/2024 1026 by Knute Suzen RAMAN, RN Outcome: Progressing 03/29/2024 0941 by Knute Suzen RAMAN, RN Outcome: Progressing   Problem: Nutrition: Goal: Adequate nutrition will be maintained 03/29/2024 1026 by Knute Suzen RAMAN, RN Outcome: Progressing 03/29/2024 0941 by Knute Suzen RAMAN, RN Outcome: Progressing   Problem: Coping: Goal: Level of anxiety will decrease 03/29/2024 1026 by Knute Suzen RAMAN, RN Outcome: Progressing 03/29/2024 0941 by Knute Suzen RAMAN, RN Outcome: Progressing   Problem: Elimination: Goal: Will not experience complications related to bowel motility 03/29/2024 1026 by Knute Suzen RAMAN, RN Outcome: Progressing 03/29/2024 0941 by Knute Suzen RAMAN, RN Outcome: Progressing Goal: Will not experience complications related to urinary retention 03/29/2024 1026 by Knute Suzen RAMAN, RN Outcome: Progressing 03/29/2024 0941 by Knute Suzen RAMAN, RN Outcome: Progressing   Problem: Pain Managment: Goal: General experience of comfort will improve and/or be controlled 03/29/2024 1026 by Knute Suzen RAMAN, RN Outcome: Progressing 03/29/2024 0941 by Knute Suzen RAMAN, RN Outcome: Progressing   Problem: Safety: Goal: Ability to remain free from injury will improve 03/29/2024 1026 by Knute Suzen RAMAN, RN Outcome: Progressing 03/29/2024 0941 by Knute Suzen RAMAN, RN Outcome: Progressing   Problem: Skin Integrity: Goal: Risk for impaired skin  integrity will decrease 03/29/2024 1026 by Knute Suzen RAMAN, RN Outcome: Progressing 03/29/2024 0941 by Knute Suzen RAMAN, RN Outcome: Progressing

## 2024-03-29 NOTE — Anesthesia Preprocedure Evaluation (Signed)
 Anesthesia Evaluation  Patient identified by MRN, date of birth, ID band Patient awake    Reviewed: Allergy & Precautions, H&P , NPO status , Patient's Chart, lab work & pertinent test results  History of Anesthesia Complications Negative for: history of anesthetic complications  Airway Mallampati: II  TM Distance: >3 FB Neck ROM: Full    Dental no notable dental hx.    Pulmonary neg pulmonary ROS, neg sleep apnea   Pulmonary exam normal breath sounds clear to auscultation       Cardiovascular hypertension, Normal cardiovascular exam Rhythm:Regular Rate:Normal     Neuro/Psych  Headaches, Seizures -,  PSYCHIATRIC DISORDERS Anxiety Depression    Epilepsy s/p VNS    GI/Hepatic negative GI ROS, Neg liver ROS,,,  Endo/Other  negative endocrine ROS    Renal/GU negative Renal ROS  negative genitourinary   Musculoskeletal negative musculoskeletal ROS (+)    Abdominal   Peds negative pediatric ROS (+)  Hematology negative hematology ROS (+)   Anesthesia Other Findings Plt 240  Reproductive/Obstetrics (+) Pregnancy                              Anesthesia Physical Anesthesia Plan  ASA: 3  Anesthesia Plan: Epidural   Post-op Pain Management:    Induction:   PONV Risk Score and Plan: 2 and Treatment may vary due to age or medical condition  Airway Management Planned: Natural Airway  Additional Equipment:   Intra-op Plan:   Post-operative Plan:   Informed Consent: I have reviewed the patients History and Physical, chart, labs and discussed the procedure including the risks, benefits and alternatives for the proposed anesthesia with the patient or authorized representative who has indicated his/her understanding and acceptance.       Plan Discussed with: Anesthesiologist  Anesthesia Plan Comments: (Patient identified. Risks, benefits, options discussed with patient including but  not limited to bleeding, infection, nerve damage, paralysis, failed block, incomplete pain control, headache, blood pressure changes, nausea, vomiting, reactions to medication, itching, and post partum back pain. Confirmed with bedside nurse the patient's most recent platelet count. Confirmed with the patient that they are not taking any anticoagulation, have any bleeding history or any family history of bleeding disorders. Patient expressed understanding and wishes to proceed. All questions were answered. )         Anesthesia Quick Evaluation

## 2024-03-29 NOTE — Plan of Care (Signed)
  Problem: Education: Goal: Knowledge of disease or condition will improve Outcome: Progressing Goal: Knowledge of the prescribed therapeutic regimen will improve Outcome: Progressing Goal: Individualized Educational Video(s) Outcome: Progressing   Problem: Clinical Measurements: Goal: Complications related to the disease process, condition or treatment will be avoided or minimized Outcome: Progressing   Problem: Education: Goal: Knowledge of General Education information will improve Description: Including pain rating scale, medication(s)/side effects and non-pharmacologic comfort measures Outcome: Progressing   Problem: Health Behavior/Discharge Planning: Goal: Ability to manage health-related needs will improve Outcome: Progressing   Problem: Clinical Measurements: Goal: Ability to maintain clinical measurements within normal limits will improve Outcome: Progressing Goal: Will remain free from infection Outcome: Progressing Goal: Diagnostic test results will improve Outcome: Progressing Goal: Respiratory complications will improve Outcome: Progressing Goal: Cardiovascular complication will be avoided Outcome: Progressing   Problem: Activity: Goal: Risk for activity intolerance will decrease Outcome: Progressing   Problem: Nutrition: Goal: Adequate nutrition will be maintained Outcome: Progressing   Problem: Coping: Goal: Level of anxiety will decrease Outcome: Progressing   Problem: Elimination: Goal: Will not experience complications related to bowel motility Outcome: Progressing Goal: Will not experience complications related to urinary retention Outcome: Progressing   Problem: Pain Managment: Goal: General experience of comfort will improve and/or be controlled Outcome: Progressing   Problem: Safety: Goal: Ability to remain free from injury will improve Outcome: Progressing   Problem: Skin Integrity: Goal: Risk for impaired skin integrity will  decrease Outcome: Progressing

## 2024-03-29 NOTE — Progress Notes (Signed)
 FACULTY PRACTICE ANTEPARTUM PROGRESS NOTE  Pamela Booker is a 28 y.o. [phone] at [redacted]w[redacted]d who is admitted for intermittent prolonged fetal bradycardia.  Estimated Date of Delivery: 05/02/24 Fetal presentation is cephalic as of 03/27/24  Length of Stay:  0 Days. Admitted 03/27/2024  Subjective: Pt spoke with Dr. Fredirick concerning recurring spontaneous fetal decels the previous evening.  Fetal status is currently reassuring.  Pt is very anxious regarding the intermittent decelerations along with the other considerations regarding her pregnancy ie her seizure disorder and cholestasis of pregnancy.  She does not feel comfortable going home and also does not want continuous fetal monitoring for the next few weeks as she has another child.  Pt desires expedited delivery with full knowledge that the baby will be premature and may need time in the NICU.  She acknowledges potential risk for c section with induction of labor as well as prolonged induction of labor.  Pt denies any recent HSV outbreaks. Patient reports normal fetal movement.  She denies uterine contractions, denies bleeding and leaking of fluid per vagina.  Vitals:  Blood pressure 119/62, pulse 75, temperature 98.4 F (36.9 C), temperature source Oral, resp. rate 17, height 5' 2 (1.575 m), weight 95.3 kg, last menstrual period 07/27/2023, SpO2 97%. Physical Examination: CONSTITUTIONAL: Well-developed, well-nourished female in no acute distress.  HENT:  Normocephalic, atraumatic, External right and left ear normal. Oropharynx is clear and moist EYES: Conjunctivae and EOM are normal.  NECK: Normal range of motion, supple, no masses. SKIN: Skin is warm and dry. No rash noted. Not diaphoretic. No erythema. No pallor. NEUROLGIC: Alert and oriented to person, place, and time. Normal reflexes, muscle tone coordination. No cranial nerve deficit noted. PSYCHIATRIC: Normal mood and affect. Normal behavior. Normal judgment and thought  content. CARDIOVASCULAR: Normal heart rate noted, regular rhythm RESPIRATORY: Effort and breath sounds normal, no problems with respiration noted MUSCULOSKELETAL: Normal range of motion. No edema and no tenderness. ABDOMEN: Soft, nontender, nondistended, gravid. CERVIX: deferred  Fetal monitoring: FHR: 120 bpm, Variability: marked, Accelerations: Present, Decelerations: Absent  Uterine activity: irritability  Results for orders placed or performed during the hospital encounter of 03/27/24 (from the past 48 hours)  Urinalysis, Routine w reflex microscopic -Urine, Clean Catch     Status: Abnormal   Collection Time: 03/27/24  8:05 PM  Result Value Ref Range   Color, Urine YELLOW YELLOW   APPearance CLEAR CLEAR   Specific Gravity, Urine 1.005 1.005 - 1.030   pH 6.0 5.0 - 8.0   Glucose, UA >=500 (A) NEGATIVE mg/dL   Hgb urine dipstick NEGATIVE NEGATIVE   Bilirubin Urine NEGATIVE NEGATIVE   Ketones, ur NEGATIVE NEGATIVE mg/dL   Protein, ur NEGATIVE NEGATIVE mg/dL   Nitrite NEGATIVE NEGATIVE   Leukocytes,Ua NEGATIVE NEGATIVE   RBC / HPF 0-5 0 - 5 RBC/hpf   WBC, UA 0-5 0 - 5 WBC/hpf   Bacteria, UA RARE (A) NONE SEEN   Squamous Epithelial / HPF 0-5 0 - 5 /HPF    Comment: Performed at [facility], [address] [place], [place] [address]  Rupture of Membrane (ROM) Plus     Status: None   Collection Time: 03/27/24  8:25 PM  Result Value Ref Range   Rom Plus NEGATIVE     Comment: Performed at [facility], [address], [place], [place] [address]  Type and screen [facility]     Status: None   Collection Time: 03/27/24  8:35 PM  Result  Value Ref Range   ABO/RH(D) O POS    Antibody Screen NEG    Sample Expiration      03/30/2024,2359 Performed at [facility], [address] [place], [place] [address]   CBC     Status: Abnormal   Collection Time: 03/27/24  8:35 PM  Result Value Ref Range   WBC 9.6 4.0 - 10.5 K/uL   RBC 3.57 (L) 3.87 - 5.11  MIL/uL   Hemoglobin 10.4 (L) 12.0 - 15.0 g/dL   HCT 67.5 (L) 63.9 - 53.9 %   MCV 90.8 80.0 - 100.0 fL   MCH 29.1 26.0 - 34.0 pg   MCHC 32.1 30.0 - 36.0 g/dL   RDW 86.7 88.4 - 84.4 %   Platelets 233 150 - 400 K/uL   nRBC 0.4 (H) 0.0 - 0.2 %    Comment: Performed at [facility], [address], [place], [place] [address]  Kleihauer-Betke stain     Status: None   Collection Time: 03/27/24  8:35 PM  Result Value Ref Range   Fetal Cells % 0 %   Quantitation Fetal Hemoglobin 0.0000 mL    Comment:  up to 15 mLs   # Vials RhIg NOT INDICATED     Comment: Performed at [facility] [place], [address] [place], [place] [address]  Glucose, capillary     Status: Abnormal   Collection Time: 03/27/24  9:47 PM  Result Value Ref Range   Glucose-Capillary 103 (H) 70 - 99 mg/dL    Comment: Glucose reference range applies only to samples taken after fasting for at least 8 hours.  Fern Test     Status: Normal   Collection Time: 03/27/24  9:51 PM  Result Value Ref Range   POCT Fern Test Negative = intact amniotic membranes   Wet prep, genital     Status: Abnormal   Collection Time: 03/27/24 10:35 PM   Specimen: Vaginal  Result Value Ref Range   Yeast Wet Prep HPF POC NONE SEEN NONE SEEN   Trich, Wet Prep NONE SEEN NONE SEEN   Clue Cells Wet Prep HPF POC PRESENT (A) NONE SEEN   WBC, Wet Prep HPF POC >=10 (A) <10   Sperm NONE SEEN     Comment: Performed at [facility], [address], [place], [place] [address]  Odetta Test     Status: None   Collection Time: 03/27/24 10:53 PM  Result Value Ref Range   POCT Fern Test Negative = intact amniotic membranes     I have reviewed the patient's current medications.  ASSESSMENT: Principal Problem:   Non-reassuring electronic fetal monitoring tracing Active Problems:   Endometriosis determined by laparoscopy   HSV-2 infection   PLAN: Discussed with team the recurrent decels and maternal concerns.  Delivery will be expedited.   Charge nurse, labor and NICU teams are aware.  Pt is aware labor may be prolonged if her cervix is not favorable and she may still need cesarean section for fetal status if needed.  No recent HSV outbreaks, GBS PCR will be taken today, but will start with GBS prophylaxis until status is known.   Continue routine antenatal care.   Jerilynn Buddle, MD [facility] Faculty Attending, [facility] 03/29/2024 8:59 AM

## 2024-03-29 NOTE — Progress Notes (Signed)
 Pamela Booker is a 28 y.o. [phone] at [redacted]w[redacted]d by {CHL MAU METHOD OF PREG DETERMINATION:21616} admitted for {Reason for Admission:21615}  Subjective:   Objective: BP 136/88   Pulse 85   Temp 98.1 F (36.7 C) (Oral)   Resp 18   Ht 5' 2 (1.575 m)   Wt 95.3 kg   LMP 07/27/2023   SpO2 99%   BMI 38.41 kg/m  No intake/output data recorded. No intake/output data recorded.  FHT:  {findings; monitor fetal heart monitor:21620} UC:   {obgyn contractions reg/irreg:312982} SVE:   Dilation: 1.5 Effacement (%): Thick Station: -3 Exam by:: Olam Rollie Fuelling CNM Foley balloon placed. Pt tolerated well. Filled to 40 ml by RN.   Labs: Lab Results  Component Value Date   WBC 7.5 03/29/2024   HGB 10.1 (L) 03/29/2024   HCT 31.6 (L) 03/29/2024   MCV 89.8 03/29/2024   PLT 240 03/29/2024    Assessment / Plan: {CHL LABOR ASSESSMENT:21627}  Labor: {CHL LABOR PROGRESS:21622} Preeclampsia:  {CHL PREECLAMPSIA PLAN:21623} Fetal Wellbeing:  {CHL FWB PLAN:21624} Pain Control:  {CHL FWB PLAN:21625} I/D:  {NA AND TPOIRJMI:78410} Anticipated MOD:  {FNI:78373}  Olam Boards, CNM 03/29/2024, 9:29 PM

## 2024-03-30 ENCOUNTER — Encounter (HOSPITAL_COMMUNITY): Payer: Self-pay

## 2024-03-30 ENCOUNTER — Encounter (HOSPITAL_COMMUNITY): Payer: Self-pay | Admitting: Obstetrics and Gynecology

## 2024-03-30 DIAGNOSIS — Z3A34 34 weeks gestation of pregnancy: Secondary | ICD-10-CM

## 2024-03-30 DIAGNOSIS — O2662 Liver and biliary tract disorders in childbirth: Secondary | ICD-10-CM

## 2024-03-30 DIAGNOSIS — O134 Gestational [pregnancy-induced] hypertension without significant proteinuria, complicating childbirth: Secondary | ICD-10-CM

## 2024-03-30 DIAGNOSIS — O42013 Preterm premature rupture of membranes, onset of labor within 24 hours of rupture, third trimester: Secondary | ICD-10-CM

## 2024-03-30 LAB — RPR: RPR Ser Ql: NONREACTIVE

## 2024-03-30 MED ORDER — ACETAMINOPHEN 325 MG PO TABS
650.0000 mg | ORAL_TABLET | ORAL | Status: DC | PRN
  Administered 2024-03-30 – 2024-04-01 (×6): 650 mg via ORAL
  Filled 2024-03-30 (×6): qty 2

## 2024-03-30 MED ORDER — ONDANSETRON HCL 4 MG PO TABS: 4.0000 mg | ORAL_TABLET | ORAL | Status: DC | PRN

## 2024-03-30 MED ORDER — BENZOCAINE-MENTHOL 20-0.5 % EX AERO: 1.0000 | INHALATION_SPRAY | CUTANEOUS | Status: DC | PRN

## 2024-03-30 MED ORDER — TETANUS-DIPHTH-ACELL PERTUSSIS 5-2-15.5 LF-MCG/0.5 IM SUSP: 0.5000 mL | Freq: Once | INTRAMUSCULAR | Status: DC

## 2024-03-30 MED ORDER — SENNOSIDES-DOCUSATE SODIUM 8.6-50 MG PO TABS
2.0000 | ORAL_TABLET | ORAL | Status: DC
  Administered 2024-03-30 – 2024-03-31 (×2): 2 via ORAL
  Filled 2024-03-30 (×3): qty 2

## 2024-03-30 MED ORDER — MORPHINE SULFATE (PF) 2 MG/ML IV SOLN: 2.0000 mg | INTRAVENOUS | Status: DC | PRN

## 2024-03-30 MED ORDER — ASPIRIN-ACETAMINOPHEN-CAFFEINE 250-250-65 MG PO TABS
2.0000 | ORAL_TABLET | Freq: Four times a day (QID) | ORAL | Status: DC | PRN
  Filled 2024-03-30 (×2): qty 2

## 2024-03-30 MED ORDER — OXYCODONE HCL 5 MG PO TABS
5.0000 mg | ORAL_TABLET | Freq: Four times a day (QID) | ORAL | Status: DC | PRN
  Administered 2024-03-30 – 2024-04-01 (×6): 5 mg via ORAL
  Filled 2024-03-30 (×6): qty 1

## 2024-03-30 MED ORDER — ONDANSETRON HCL 4 MG/2ML IJ SOLN: 4.0000 mg | INTRAMUSCULAR | Status: DC | PRN

## 2024-03-30 MED ORDER — ACETAMINOPHEN-CAFFEINE 500-65 MG PO TABS
2.0000 | ORAL_TABLET | Freq: Four times a day (QID) | ORAL | Status: DC | PRN
  Administered 2024-03-30: 2 via ORAL

## 2024-03-30 MED ORDER — SODIUM CHLORIDE 0.9% FLUSH
3.0000 mL | INTRAVENOUS | Status: DC | PRN
Start: 2024-03-30 — End: 2024-04-01

## 2024-03-30 MED ORDER — HYDROMORPHONE HCL 2 MG PO TABS
2.0000 mg | ORAL_TABLET | ORAL | Status: DC | PRN
  Administered 2024-03-30: 2 mg via ORAL
  Filled 2024-03-30: qty 1

## 2024-03-30 MED ORDER — SODIUM CHLORIDE 0.9 % IV SOLN
250.0000 mL | INTRAVENOUS | Status: DC | PRN
Start: 2024-03-30 — End: 2024-04-01

## 2024-03-30 MED ORDER — WITCH HAZEL-GLYCERIN EX PADS: 1.0000 | MEDICATED_PAD | CUTANEOUS | Status: DC | PRN

## 2024-03-30 MED ORDER — PRENATAL MULTIVITAMIN CH
1.0000 | ORAL_TABLET | Freq: Every day | ORAL | Status: DC
  Administered 2024-03-30 – 2024-03-31 (×2): 1 via ORAL
  Filled 2024-03-30 (×2): qty 1

## 2024-03-30 MED ORDER — ESCITALOPRAM OXALATE 10 MG PO TABS
20.0000 mg | ORAL_TABLET | Freq: Every day | ORAL | Status: DC
Start: 2024-03-30 — End: 2024-04-01
  Administered 2024-03-31: 20 mg via ORAL
  Filled 2024-03-30 (×2): qty 2
  Filled 2024-03-30: qty 1

## 2024-03-30 MED ORDER — DIBUCAINE (PERIANAL) 1 % EX OINT: 1.0000 | TOPICAL_OINTMENT | CUTANEOUS | Status: DC | PRN

## 2024-03-30 MED ORDER — COCONUT OIL OIL: 1.0000 | TOPICAL_OIL | Status: DC | PRN

## 2024-03-30 MED ORDER — FUROSEMIDE 20 MG PO TABS
20.0000 mg | ORAL_TABLET | Freq: Every day | ORAL | Status: DC
  Administered 2024-03-30 – 2024-03-31 (×2): 20 mg via ORAL
  Filled 2024-03-30 (×2): qty 1

## 2024-03-30 MED ORDER — MEASLES, MUMPS & RUBELLA VAC ~~LOC~~ SUSR: 0.5000 mL | Freq: Once | SUBCUTANEOUS | Status: DC

## 2024-03-30 MED ORDER — SODIUM CHLORIDE 0.9% FLUSH
3.0000 mL | Freq: Two times a day (BID) | INTRAVENOUS | Status: DC
  Administered 2024-03-30: 3 mL via INTRAVENOUS

## 2024-03-30 MED ORDER — ACETAMINOPHEN-CAFFEINE 500-65 MG PO TABS: 2.0000 | ORAL_TABLET | Freq: Four times a day (QID) | ORAL | Status: DC | PRN

## 2024-03-30 MED ORDER — SIMETHICONE 80 MG PO CHEW: 80.0000 mg | CHEWABLE_TABLET | ORAL | Status: DC | PRN

## 2024-03-30 MED ORDER — POTASSIUM CHLORIDE CRYS ER 20 MEQ PO TBCR
20.0000 meq | EXTENDED_RELEASE_TABLET | Freq: Once | ORAL | Status: AC
  Administered 2024-03-30: 20 meq via ORAL
  Filled 2024-03-30: qty 1

## 2024-03-30 MED ORDER — NIFEDIPINE ER OSMOTIC RELEASE 30 MG PO TB24
30.0000 mg | ORAL_TABLET | Freq: Every day | ORAL | Status: DC
  Administered 2024-03-30: 30 mg via ORAL
  Filled 2024-03-30: qty 1

## 2024-03-30 NOTE — Progress Notes (Signed)
 Pamela Booker is a 28 y.o. [phone] at [redacted]w[redacted]d admitted for IOL for prolonged decels.   Subjective: Pt feeling rectal pressure. Husband at bedside for support.   Objective: BP (!) 142/101   Pulse 97   Temp 97.7 F (36.5 C) (Oral)   Resp 16   Ht 5' 2 (1.575 m)   Wt 95.3 kg   LMP 07/27/2023   SpO2 99%   BMI 38.41 kg/m  I/O last 3 completed shifts: In: -  Out: 1000 [Urine:1000] No intake/output data recorded.  FHT:  FHR: 135 bpm, variability: moderate,  accelerations:  Present,  decelerations:  Absent UC:   regular, every 2-3 minutes SVE:   Dilation: 8.5 Effacement (%): 100 Station: 0 Exam by:: Rosina Pereyra RN  Labs: Lab Results  Component Value Date   WBC 7.5 03/29/2024   HGB 10.1 (L) 03/29/2024   HCT 31.6 (L) 03/29/2024   MCV 89.8 03/29/2024   PLT 240 03/29/2024    Assessment / Plan: IOL for prolonged decels S/P Foley balloon, AROM, Pitocin  Labor: Progressing normally Preeclampsia:  n/a Fetal Wellbeing:  Category I Pain Control:  Epidural I/D:  GBS unknown Anticipated MOD:  NSVD  Olam Boards, CNM 03/30/2024, 7:56 AM

## 2024-03-30 NOTE — Discharge Summary (Signed)
 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 Cholestasis in pregnancy     Discharge diagnosis: {DX.:23714}                                              Post partum procedures:{Postpartum procedures:23558} Augmentation: AROM, Pitocin, Cytotec , and IP Foley Complications: Shoulder dystocia  Hospital course: Induction of Labor With Vaginal Delivery   28 y.o. yo [phone] at [redacted]w[redacted]d was admitted to the hospital 03/27/2024 for induction of labor.  Indication for induction: Non-reassuring FHT.  Patient had an labor course complicated by shoulder dystocia and infant in the NICU Membrane Rupture Time/Date: 2:06 AM,03/30/2024  Delivery Method:Vaginal, Spontaneous Operative Delivery:N/A Episiotomy: None Lacerations:  None Details of delivery can be found in separate delivery note.  Patient had a postpartum course complicated by***. Patient is discharged home 03/30/24.  Newborn Data: Birth date:03/30/2024 Birth time:10:23 AM Gender:Female Living status:Living Apgars:3 ,  Weight:2920 g  Magnesium Sulfate received: {Mag received:30440022} BMZ received: No Rhophylac:N/A MMR:N/A T-DaP:Given prenatally Flu: Yes RSV Vaccine received: No Transfusion:{Transfusion received:30440034}  Immunizations received: Immunization History  Administered Date(s) Administered   HPV 9-valent 09/21/2020   Hepatitis A, Adult  04/10/2019   Influenza Inj Mdck Quad Pf 04/03/2019   Influenza, Seasonal, Injecte, Preservative Fre 02/27/2024   Influenza,inj,Quad PF,6+ Mos 03/11/2018   Influenza-Unspecified 04/03/2019   Meningococcal B Recombinant 04/10/2019, 10/07/2019   Tdap 04/10/2019, 02/27/2024    Physical exam  Vitals:   03/30/24 1030 03/30/24 1031 03/30/24 1045 03/30/24 1100  BP: 135/65 114/61 113/83 (!) 112/58  Pulse: (!) 108 95 89 91  Resp:    16  Temp:      TempSrc:      SpO2:      Weight:      Height:       General: {Exam; general:21111117} Lochia: {Desc; appropriate/inappropriate:30686::appropriate} Uterine Fundus: {Desc; firm/soft:30687} Incision: {Exam; incision:21111123} DVT Evaluation: {Exam; dvt:2111122} Labs: Lab Results  Component Value Date   WBC 7.5 03/29/2024   HGB 10.1 (L) 03/29/2024   HCT 31.6 (L) 03/29/2024   MCV 89.8 03/29/2024   PLT 240 03/29/2024      Latest Ref Rng & Units 03/29/2024    3:42 PM  CMP  Glucose 70 - 99 mg/dL 882   BUN 6 - 20 mg/dL 6   Creatinine 9.55 - 8.99 mg/dL 9.27   Sodium 864 - 854 mmol/L 135   Potassium 3.5 - 5.1 mmol/L 3.6   Chloride 98 - 111 mmol/L 105   CO2 22 - 32 mmol/L 22   Calcium 8.9 - 10.3 mg/dL 8.9   Total Protein 6.5 - 8.1 g/dL 5.8   Total Bilirubin 0.0 - 1.2 mg/dL 0.4   Alkaline Phos 38 - 126 U/L 225   AST 15 - 41 U/L 37   ALT 0 - 44 U/L 31    Edinburgh Score:     No data to  display         No data recorded  After visit meds:  Allergies as of 03/30/2024       Reactions   Zolpidem Other (See Comments)   seizure Other Reaction(s): Other (See Comments)    seizure   Diphenhydramine  Other (See Comments)   Crawling feeling under skin Crawling feeling under skin    It feels as if I have things crawling on my skin. It feels as if I have things crawling on my skin.     Crawling feeling under skin    It feels as if I have things crawling on my skin.   Crawling feeling under skin    Crawling feeling  under skin  It feels as if I have things crawling on my skin.   Ibuprofen Other (See Comments)   Other reaction(s): GI Upset (intolerance) Other reaction(s): GI Upset (intolerance) Other reaction(s): GI Upset (intolerance)    Other reaction(s): GI Upset (intolerance)    I have stomach ulcers and I get really bad cramping. Other reaction(s): GI Upset (intolerance)    I have stomach ulcers and I get really bad cramping.    Other reaction(s): GI Upset (intolerance) Other reaction(s): GI Upset (intolerance)    Other reaction(s): GI Upset (intolerance) Other reaction(s): GI Upset (intolerance)  Other reaction(s): GI Upset (intolerance)  I have stomach ulcers and I get really bad cramping.   Nsaids Other (See Comments)   History of bleeding ulcers   Compazine  Alyosha.athens ]    Keppra  Irenaeus.hinds ] Other (See Comments)   Makes her feel hungover    Phenergan  [promethazine ]    Emgality [galcanezumab-gnlm] Rash   Lorazepam  Itching     Med Rec must be completed prior to using this [facility]***        Discharge home in stable condition Infant Feeding: {Baby feeding:23562} Infant Disposition:{CHL IP OB HOME WITH FNUYZM:76418} Discharge instruction: per After Visit Summary and Postpartum booklet. Activity: Advance as tolerated. Pelvic rest for 6 weeks.  Diet: {OB ipzu:78888878} Future Appointments: Future Appointments  Date Time Provider Department Center  04/02/2024  3:55 PM [facility] US2 [facility]  04/09/2024  1:35 PM Eldonna Suzen Octave, MD [facility]  04/09/2024  3:55 PM [facility] US2 [facility]  04/11/2024 12:00 AM [facility] SCHED ROOM [facility] None  04/28/2024 10:30 AM Celestia Rima, NP [facility] None    -Message sent to [facility] 03/30/24. - CC   03/30/2024 Colter LITTIE Angles, MD

## 2024-03-30 NOTE — Anesthesia Postprocedure Evaluation (Signed)
 Anesthesia Post Note  Patient: Pamela Booker  Procedure(s) Performed: AN AD HOC LABOR EPIDURAL     Patient location during evaluation: Mother Baby Anesthesia Type: Epidural Level of consciousness: awake, awake and alert and oriented Pain management: satisfactory to patient Vital Signs Assessment: post-procedure vital signs reviewed and stable Respiratory status: spontaneous breathing, nonlabored ventilation and respiratory function stable Cardiovascular status: blood pressure returned to baseline and stable Postop Assessment: no headache, no backache, no apparent nausea or vomiting, patient able to bend at knees, adequate PO intake and able to ambulate Anesthetic complications: no   No notable events documented.  Last Vitals:  Vitals:   03/30/24 1332 03/30/24 1515  BP: 137/70 127/82  Pulse: 75 80  Resp: 16 16  Temp: 36.8 C 36.7 C  SpO2: 100% 100%    Last Pain:  Vitals:   03/30/24 1823  TempSrc:   PainSc: 7    Pain Goal: Patients Stated Pain Goal: 1 (03/28/24 1955)                 Marites Nath

## 2024-03-30 NOTE — Lactation Note (Signed)
 This note was copied from a baby's chart. Lactation Consultation Note  Patient Name: Pamela Booker Date: 03/30/2024 Age:30 hours   LC in to visit with P2 Mom of baby delivered vaginally and admitted to the NICU.  Mom decided to formula feed and not pump due to her seizure medication.  She has spoken to her neurologists and made this decision. Lactation services complete at this time.   Consult Status Consult Status: Complete    Claudene Aleck BRAVO 03/30/2024, 1:24 PM

## 2024-03-30 NOTE — Progress Notes (Signed)
 Pamela Booker is a 28 y.o. [phone] at [redacted]w[redacted]d by LMP admitted for recurrent decels  Subjective: Comfortable with epidural   Objective: BP 110/63   Pulse 81   Temp 98.2 F (36.8 C) (Oral)   Resp 17   Ht 5' 2 (1.575 m)   Wt 95.3 kg   LMP 07/27/2023   SpO2 96%   BMI 38.41 kg/m  No intake/output data recorded. No intake/output data recorded.  FHT:  FHR: 140 bpm, variability: moderate,  accelerations:  Present,  decelerations:  Absent UC:   irregular, every 3-8 minutes SVE:   Dilation: 5 Effacement (%): 70 Station: -3 Exam by:: Smalley RN AROM clear fluid  Labs: Lab Results  Component Value Date   WBC 7.5 03/29/2024   HGB 10.1 (L) 03/29/2024   HCT 31.6 (L) 03/29/2024   MCV 89.8 03/29/2024   PLT 240 03/29/2024    Assessment / Plan: Induction of labor due to recurrent decels,  progressing well on pitocin  Labor: Progressing on Pitocin, will continue to increase then AROM-->AROM done Preeclampsia:  no signs or symptoms of toxicity Fetal Wellbeing:  Category I Pain Control:  Epidural I/D:  s/p PCN x 2 doses Anticipated MOD:  NSVD  Glenys GORMAN Birk, MD 03/30/2024, 2:07 AM

## 2024-03-31 MED ORDER — POTASSIUM CHLORIDE CRYS ER 20 MEQ PO TBCR: 20.0000 meq | EXTENDED_RELEASE_TABLET | Freq: Every day | ORAL | Status: DC

## 2024-03-31 MED ORDER — POLYETHYLENE GLYCOL 3350 17 G PO PACK
17.0000 g | PACK | Freq: Every day | ORAL | Status: DC | PRN
  Administered 2024-03-31: 17 g via ORAL
  Filled 2024-03-31: qty 1

## 2024-03-31 MED ORDER — POTASSIUM CHLORIDE CRYS ER 20 MEQ PO TBCR
20.0000 meq | EXTENDED_RELEASE_TABLET | Freq: Every day | ORAL | Status: DC
Start: 2024-03-31 — End: 2024-04-04
  Administered 2024-03-31 – 2024-04-01 (×2): 20 meq via ORAL
  Filled 2024-03-31 (×2): qty 1

## 2024-03-31 MED ORDER — FUROSEMIDE 20 MG PO TABS
20.0000 mg | ORAL_TABLET | Freq: Every day | ORAL | Status: DC
  Administered 2024-04-01: 20 mg via ORAL
  Filled 2024-03-31: qty 1

## 2024-03-31 MED ORDER — NIFEDIPINE ER OSMOTIC RELEASE 60 MG PO TB24
60.0000 mg | ORAL_TABLET | Freq: Every day | ORAL | Status: DC
  Administered 2024-03-31 – 2024-04-01 (×2): 60 mg via ORAL
  Filled 2024-03-31 (×2): qty 1

## 2024-03-31 NOTE — Progress Notes (Signed)
 Post Partum Day 1 Subjective: no complaints, up ad lib, voiding, and tolerating PO  Objective: Blood pressure 127/82, pulse 87, temperature 97.7 F (36.5 C), temperature source Oral, resp. rate 16, height 5' 2 (1.575 m), weight 95.3 kg, last menstrual period 07/27/2023, SpO2 100%, unknown if currently breastfeeding. Vitals:   03/30/24 1332 03/30/24 1515 03/30/24 2006 03/31/24 0452  BP: 137/70 127/82 (!) 148/83 127/82  Pulse: 75 80 88 87  Resp: 16 16 16 16   Temp: 98.3 F (36.8 C) 98 F (36.7 C) 98 F (36.7 C) 97.7 F (36.5 C)  TempSrc: Oral Oral Oral Oral  SpO2: 100% 100% 100% 100%  Weight:      Height:        Intake/Output Summary (Last 24 hours) at 03/31/2024 0705 Last data filed at 03/30/2024 1621 Gross per 24 hour  Intake 1220.24 ml  Output 1856 ml  Net -635.76 ml    Physical Exam:  General: alert, cooperative, and appears stated age 28: appropriate Uterine Fundus: firm DVT Evaluation: No evidence of DVT seen on physical exam.  Recent Labs    03/29/24 1542  HGB 10.1*  HCT 31.6*    Assessment/Plan: Plan for discharge tomorrow, Breastfeeding, and Contraception phexxi Increased Procardia to 60 mg daily On lasix  LOS: 2 days   Pamela GORMAN Birk, MD 03/31/2024, 7:04 AM

## 2024-03-31 NOTE — Plan of Care (Signed)
   Problem: Education: Goal: Knowledge of General Education information will improve Description: Including pain rating scale, medication(s)/side effects and non-pharmacologic comfort measures Outcome: Progressing   Problem: Health Behavior/Discharge Planning: Goal: Ability to manage health-related needs will improve Outcome: Progressing   Problem: Clinical Measurements: Goal: Ability to maintain clinical measurements within normal limits will improve Outcome: Progressing Goal: Will remain free from infection Outcome: Progressing Goal: Diagnostic test results will improve Outcome: Progressing Goal: Respiratory complications will improve Outcome: Progressing Goal: Cardiovascular complication will be avoided Outcome: Progressing   Problem: Activity: Goal: Risk for activity intolerance will decrease Outcome: Progressing   Problem: Nutrition: Goal: Adequate nutrition will be maintained Outcome: Progressing   Problem: Coping: Goal: Level of anxiety will decrease Outcome: Progressing   Problem: Elimination: Goal: Will not experience complications related to bowel motility Outcome: Progressing Goal: Will not experience complications related to urinary retention Outcome: Progressing   Problem: Pain Managment: Goal: General experience of comfort will improve and/or be controlled Outcome: Progressing   Problem: Safety: Goal: Ability to remain free from injury will improve Outcome: Progressing   Problem: Skin Integrity: Goal: Risk for impaired skin integrity will decrease Outcome: Progressing   Problem: Education: Goal: Knowledge of condition will improve Outcome: Progressing Goal: Individualized Educational Video(s) Outcome: Progressing Goal: Individualized Newborn Educational Video(s) Outcome: Progressing   Problem: Activity: Goal: Will verbalize the importance of balancing activity with adequate rest periods Outcome: Progressing Goal: Ability to tolerate increased  activity will improve Outcome: Progressing   Problem: Coping: Goal: Ability to identify and utilize available resources and services will improve Outcome: Progressing   Problem: Life Cycle: Goal: Chance of risk for complications during the postpartum period will decrease Outcome: Progressing   Problem: Role Relationship: Goal: Ability to demonstrate positive interaction with newborn will improve Outcome: Progressing   Problem: Skin Integrity: Goal: Demonstration of wound healing without infection will improve Outcome: Progressing

## 2024-04-01 MED ORDER — NIFEDIPINE ER 60 MG PO TB24: 60.0000 mg | ORAL_TABLET | Freq: Every day | ORAL | 1 refills | Status: AC

## 2024-04-01 MED ORDER — FUROSEMIDE 20 MG PO TABS: 20.0000 mg | ORAL_TABLET | Freq: Every day | ORAL | 0 refills | Status: DC

## 2024-04-01 NOTE — Progress Notes (Signed)
 Circumcision Consent  Discussed with mom at bedside about circumcision.   Circumcision is a surgery that removes the skin that covers the tip of the penis, called the foreskin. Circumcision is usually done when a boy is between 57 and 66 days old, sometimes up to 62-44 weeks old.  The most common reasons boys are circumcised include for cultural/religious beliefs or for parental preference (potentially easier to clean, so baby looks like daddy, etc).  There may be some medical benefits for circumcision:   Circumcised boys seem to have slightly lower rates of: ? Urinary tract infections (per the American Academy of Pediatrics an uncircumcised boy has a 1/100 chance of developing a UTI in the first year of life, a circumcised boy at a 06/998 chance of developing a UTI in the first year of life- a 10% reduction) ? Penis cancer (typically rare- an uncircumcised female has a 1 in 100,000 chance of developing cancer of the penis) ? Sexually transmitted infection (in endemic areas, including HIV, HPV and Herpes- circumcision does NOT protect against gonorrhea, chlamydia, trachomatis, or syphilis) ? Phimosis: a condition where that makes retraction of the foreskin over the glans impossible (0.4 per 1000 boys per year or 0.6% of boys are affected by their 15th birthday)  Boys and men who are not circumcised can reduce these extra risks by: ? Cleaning their penis well ? Using condoms during sex  What are the risks of circumcision?  As with any surgical procedure, there are risks and complications. In circumcision, complications are rare and usually minor, the most common being: ? Bleeding- risk is reduced by holding each clamp for 30 seconds prior to a cut being made, and by holding pressure after the procedure is done ? Infection- the penis is cleaned prior to the procedure, and the procedure is done under sterile technique ? Damage to the urethra or amputation of the penis  How is circumcision done  in baby boys?  The baby will be placed on a special table and the legs restrained for their safety. Numbing medication is injected into the penis, and the skin is cleansed with betadine  to decrease the risk of infection.   What to expect:  The penis will look red and raw for 5-7 days as it heals. We expect scabbing around where the cut was made, as well as clear-pink fluid and some swelling of the penis right after the procedure. If your baby's circumcision starts to bleed or develops pus, please contact your pediatrician immediately.  All questions were answered and mother consented.  [facility] attending

## 2024-04-01 NOTE — Progress Notes (Signed)
 Pt discharged home in stable condition after discharge instructions given. Pt verbalized understanding and all questions were answered.Pt was sent home with all belongings.

## 2024-04-01 NOTE — Plan of Care (Signed)
   Problem: Education: Goal: Knowledge of General Education information will improve Description: Including pain rating scale, medication(s)/side effects and non-pharmacologic comfort measures Outcome: Progressing   Problem: Health Behavior/Discharge Planning: Goal: Ability to manage health-related needs will improve Outcome: Progressing   Problem: Clinical Measurements: Goal: Ability to maintain clinical measurements within normal limits will improve Outcome: Progressing Goal: Will remain free from infection Outcome: Progressing Goal: Diagnostic test results will improve Outcome: Progressing Goal: Respiratory complications will improve Outcome: Progressing Goal: Cardiovascular complication will be avoided Outcome: Progressing   Problem: Activity: Goal: Risk for activity intolerance will decrease Outcome: Progressing   Problem: Nutrition: Goal: Adequate nutrition will be maintained Outcome: Progressing   Problem: Coping: Goal: Level of anxiety will decrease Outcome: Progressing   Problem: Elimination: Goal: Will not experience complications related to bowel motility Outcome: Progressing Goal: Will not experience complications related to urinary retention Outcome: Progressing   Problem: Pain Managment: Goal: General experience of comfort will improve and/or be controlled Outcome: Progressing   Problem: Safety: Goal: Ability to remain free from injury will improve Outcome: Progressing   Problem: Skin Integrity: Goal: Risk for impaired skin integrity will decrease Outcome: Progressing   Problem: Education: Goal: Knowledge of condition will improve Outcome: Progressing Goal: Individualized Educational Video(s) Outcome: Progressing Goal: Individualized Newborn Educational Video(s) Outcome: Progressing   Problem: Activity: Goal: Will verbalize the importance of balancing activity with adequate rest periods Outcome: Progressing Goal: Ability to tolerate increased  activity will improve Outcome: Progressing   Problem: Coping: Goal: Ability to identify and utilize available resources and services will improve Outcome: Progressing   Problem: Life Cycle: Goal: Chance of risk for complications during the postpartum period will decrease Outcome: Progressing   Problem: Role Relationship: Goal: Ability to demonstrate positive interaction with newborn will improve Outcome: Progressing   Problem: Skin Integrity: Goal: Demonstration of wound healing without infection will improve Outcome: Progressing

## 2024-04-02 ENCOUNTER — Other Ambulatory Visit

## 2024-04-03 ENCOUNTER — Telehealth: Admitting: Physician Assistant

## 2024-04-03 DIAGNOSIS — N61 Mastitis without abscess: Secondary | ICD-10-CM | POA: Diagnosis not present

## 2024-04-03 MED ORDER — SULFAMETHOXAZOLE-TRIMETHOPRIM 800-160 MG PO TABS: 1.0000 | ORAL_TABLET | Freq: Two times a day (BID) | ORAL | 0 refills | Status: DC

## 2024-04-03 NOTE — Lactation Note (Signed)
 This note was copied from a baby'Pamela chart.  NICU Lactation Consultation Note  Patient Name: Pamela Booker Date: 04/03/2024 Age:30 days  Reason for consult: Initial assessment; Mother'Pamela request; NICU baby; RN request; Late-preterm 34-36.6wks; Other (Comment); Maternal endocrine disorder; Nipple pain/trauma (gHTN) Type of Endocrine Disorder?: Thyroid  (thyroid  lobectomy)  SUBJECTIVE NICU RN Pamela Booker ORN asked this LC to visit with family because Pamela Booker has request lactation services. Her provider advised her against breastfeeding due to taking Trileptal  an L3 according to Medications and Mother'Pamela milk by Pamela Booker. She reported she'Pamela been pumping but only for comfort, she'Pamela planning on using her breastmilk for baths; baby currently on donor milk.   She had a virtual visit today with her provider due to a possible mastitis episode, she voiced she had mastitis with her daughter (first child) 10 years ago. Provider has prescribed sulfamethoxazole-trimethoprim (BACTRIM DS) 800-160 MG tablet; Take 1 tablet by mouth 2 (two) times daily x 7 days since she won't be breastfeeding. Unable to perform a breast exam due to family wanting to give baby a bath; parents and RN asked LC to come back at a later time  Addendum @ 1844 This LC came back in the room to check on [name] but she was asleep. Family to call for Pamela Booker services PRN.   OBJECTIVE Infant data: Mother'Pamela Current Feeding Choice: Breast Milk and Donor Milk  O2 Device: Room Air  Infant feeding assessment IDFTS - Readiness: 4   Maternal data: [id number] Vaginal, Spontaneous Has patient been taught Hand Expression?: Yes Hand Expression Comments: plenty of colostrum Significant Breast History:: (+) breast changes during the pregnancy, had mastitis with first child Current breast feeding challenges:: NICU admission Does the patient have breastfeeding experience prior to this delivery?: Yes How long did the patient breastfeed?: 4  weeks Risk factor for low/delayed milk supply:: gHTN, infant separation  Pump: Hands Free, Personal (Lansinoh wearable)  ASSESSMENT Infant: Feeding Status: Scheduled 9-12-3-6 Feeding method: Tube/Gavage (Bolus) Nipple Type: no flow nipple  Maternal: Pumping for comfort only  INTERVENTIONS/PLAN Interventions: Interventions: Breast feeding basics reviewed; DEBP; Education Discharge Education: Engorgement and breast care  Plan: Consult Status: PRN NICU Follow-up type: Verify onset of copious milk; Verify absence of engorgement   Pamela Booker Pamela Booker 04/03/2024, 7:48 PM

## 2024-04-03 NOTE — Progress Notes (Signed)
 Virtual Visit Consent   Pamela Booker, you are scheduled for a virtual visit with a  provider today. Just as with appointments in the office, your consent must be obtained to participate. Your consent will be active for this visit and any virtual visit you may have with one of our providers in the next 365 days. If you have a MyChart account, a copy of this consent can be sent to you electronically.  As this is a virtual visit, video technology does not allow for your provider to perform a traditional examination. This may limit your provider's ability to fully assess your condition. If your provider identifies any concerns that need to be evaluated in person or the need to arrange testing (such as labs, EKG, etc.), we will make arrangements to do so. Although advances in technology are sophisticated, we cannot ensure that it will always work on either your end or our end. If the connection with a video visit is poor, the visit may have to be switched to a telephone visit. With either a video or telephone visit, we are not always able to ensure that we have a secure connection.  By engaging in this virtual visit, you consent to the provision of healthcare and authorize for your insurance to be billed (if applicable) for the services provided during this visit. Depending on your insurance coverage, you may receive a charge related to this service.  I need to obtain your verbal consent now. Are you willing to proceed with your visit today? Pamela Booker has provided verbal consent on 04/03/2024 for a virtual visit (video or telephone). Pamela Booker, [place]  Date: 04/03/2024 1:51 PM   Virtual Visit via Video Note   I, Pamela Booker, connected with  Pamela Booker  ([id number], 03-25-1996) on 04/03/24 at  1:45 PM EDT by a video-enabled telemedicine application and verified that I am speaking with the correct person using two  identifiers.  Location: Patient: Virtual Visit Location Patient: Home Provider: Virtual Visit Location Provider: Home Office   I discussed the limitations of evaluation and management by telemedicine and the availability of in person appointments. The patient expressed understanding and agreed to proceed.    History of Present Illness: Pamela Booker is a 28 y.o. who identifies as a female who was assigned female at birth, and is being seen today for concern of possible mastitis. Notes giving birth to her son 4.5 days ago. Has been trying to pump breast milk for milk baths for her son. Son is in the NICU and not receiving breast milk for feeds. Notes taking Sudafed now to start drying up her milk supply at recommendation of lactation consultant. Since last night noting pain and tenderness around the milk ducts, nipple and surrounding tissue. Tried to continue expressing milk to see if it would help but symptoms have continued. Denies fever, chills. Notes history of severe mastitis after the birth of her daughter some years ago and does not want anything to worsen further.  HPI: HPI  Problems:  Patient Active Problem List   Diagnosis Date Noted   Non-reassuring electronic fetal monitoring tracing 03/27/2024   Carpal tunnel syndrome on right 03/26/2024   Gestational hypertension 03/23/2024   Cholestasis during pregnancy in third trimester 02/29/2024   History of lobectomy of thyroid  01/02/2024   Thyroid  nodule 12/04/2023   Nonspecific syndrome suggestive of viral illness 11/16/2023   Hyperemesis gravidarum 10/11/2023   Long-term current use of benzodiazepine 10/08/2023  Supervision of high-risk pregnancy 04/27/2023   Epilepsy (HCC) 07/05/2021   Migraine 07/05/2021   Hx of preeclampsia, prior pregnancy, currently pregnant 07/05/2021   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  It feels as if I have things crawling on my skin.     Crawling feeling under skin    It feels as if I have things crawling on my skin.   Crawling feeling under skin    Crawling feeling under skin  It feels as if I have things crawling on my skin.   Ibuprofen Other (See Comments)    Other reaction(s): GI Upset (intolerance)  Other reaction(s): GI Upset (intolerance) Other reaction(s): GI Upset (intolerance)    Other reaction(s): GI Upset (intolerance)    I have stomach ulcers and I get really bad cramping.  Other reaction(s): GI Upset (intolerance)    I have stomach ulcers and I get really bad cramping.    Other reaction(s): GI Upset (intolerance) Other reaction(s): GI Upset (intolerance)    Other reaction(s): GI Upset (intolerance) Other reaction(s): GI Upset (intolerance)  Other reaction(s): GI Upset (intolerance)  I have stomach ulcers and I get really bad cramping.   Nsaids Other (See Comments)    History of bleeding ulcers   Compazine  [Prochlorperazine ]    Keppra  [Levetiracetam ] Other (See Comments)    Makes her feel hungover    Phenergan  [Promethazine ]    Emgality [Galcanezumab-Gnlm] Rash   Lorazepam  Itching   Medications:  Current Outpatient Medications:    sulfamethoxazole-trimethoprim (BACTRIM DS) 800-160 MG tablet, Take 1 tablet by mouth 2 (two) times daily., Disp: 14 tablet, Rfl: 0   diazepam  (VALIUM ) 5 MG tablet, Take 5 mg by mouth 2 (two) times daily., Disp: , Rfl:    escitalopram  (LEXAPRO ) 20 MG tablet, Take 20 mg by mouth daily., Disp: , Rfl:    furosemide (LASIX) 20 MG tablet, Take 1  tablet (20 mg total) by mouth daily., Disp: 4 tablet, Rfl: 0   hydrOXYzine (ATARAX) 25 MG tablet, Take 1 tablet (25 mg total) by mouth every 6 (six) hours as needed for itching., Disp: 30 tablet, Rfl: 2   NIFEdipine (ADALAT CC) 60 MG 24 hr tablet, Take 1 tablet (60 mg total) by mouth daily., Disp: 30 tablet, Rfl: 1   OXcarbazepine  (TRILEPTAL ) 150 MG tablet, Take 4 tablets (600 mg total) by mouth 2 (two) times daily., Disp: , Rfl:    pantoprazole  (PROTONIX ) 40 MG tablet, Take 1 tablet (40 mg total) by mouth 2 (two) times daily., Disp: 60 tablet, Rfl: 3   Prenatal Vit-Fe Fumarate-FA (MULTIVITAMIN-PRENATAL) 27-0.8 MG TABS tablet, Take 2 tablets by mouth daily at 12 noon., Disp: , Rfl:    sucralfate  (CARAFATE ) 1 GM/10ML suspension, Take 10 mLs (1 g total) by mouth daily. Take after evening meal, Disp: 420 mL, Rfl: 1   valACYclovir  (VALTREX ) 500 MG tablet, Take 1 tablet (500 mg total) by mouth 2 (two) times daily., Disp: 60 tablet, Rfl: 6  Observations/Objective: Patient is well-developed, well-nourished in no acute distress.  Resting comfortably  at home.  Head is normocephalic, atraumatic.  No labored breathing.  Speech is clear and coherent with logical content.  Patient is alert and oriented at baseline.   Assessment and Plan: 1. Mastitis (Primary) - sulfamethoxazole-trimethoprim (BACTRIM DS) 800-160 MG tablet; Take 1 tablet by mouth 2 (two) times daily.  Dispense: 14 tablet; Refill: 0  Since not breastfeeding her newborn, will start Bactrim to cover for both MSSA and MRSA. Supportive measures and OTC medications reviewed. OB follow-up discussed.  Follow Up Instructions: I discussed the assessment and treatment plan with the patient. The patient was provided an opportunity to ask questions and all were answered. The patient agreed with the plan and demonstrated an understanding of the instructions.  A copy of instructions were sent to the patient via MyChart unless otherwise noted below.    The patient was advised to call back or seek an in-person evaluation if the symptoms worsen or if the condition fails to improve as anticipated.    Pamela Velma Lunger, PA-C

## 2024-04-03 NOTE — Patient Instructions (Signed)
 Pamela Booker, thank you for joining [name], PA-C for today's virtual visit.  While this provider is not your primary care provider (PCP), if your PCP is located in our provider database this encounter information will be shared with them immediately following your visit.   A [facility] MyChart account gives you access to today's visit and all your visits, tests, and labs performed at [facility]  click here if you don't have a [facility] MyChart account or go to [email or web address]  Consent: (Patient) Pamela Booker provided verbal consent for this virtual visit at the beginning of the encounter.  Current Medications:  Current Outpatient Medications:    diazepam  (VALIUM ) 5 MG tablet, Take 5 mg by mouth 2 (two) times daily., Disp: , Rfl:    escitalopram  (LEXAPRO ) 20 MG tablet, Take 20 mg by mouth daily., Disp: , Rfl:    furosemide (LASIX) 20 MG tablet, Take 1 tablet (20 mg total) by mouth daily., Disp: 4 tablet, Rfl: 0   hydrOXYzine (ATARAX) 25 MG tablet, Take 1 tablet (25 mg total) by mouth every 6 (six) hours as needed for itching., Disp: 30 tablet, Rfl: 2   NIFEdipine (ADALAT CC) 60 MG 24 hr tablet, Take 1 tablet (60 mg total) by mouth daily., Disp: 30 tablet, Rfl: 1   OXcarbazepine  (TRILEPTAL ) 150 MG tablet, Take 4 tablets (600 mg total) by mouth 2 (two) times daily., Disp: , Rfl:    pantoprazole  (PROTONIX ) 40 MG tablet, Take 1 tablet (40 mg total) by mouth 2 (two) times daily., Disp: 60 tablet, Rfl: 3   Prenatal Vit-Fe Fumarate-FA (MULTIVITAMIN-PRENATAL) 27-0.8 MG TABS tablet, Take 2 tablets by mouth daily at 12 noon., Disp: , Rfl:    sucralfate  (CARAFATE ) 1 GM/10ML suspension, Take 10 mLs (1 g total) by mouth daily. Take after evening meal, Disp: 420 mL, Rfl: 1   valACYclovir  (VALTREX ) 500 MG tablet, Take 1 tablet (500 mg total) by mouth 2 (two) times daily., Disp: 60 tablet, Rfl: 6   Medications ordered in this encounter:  No orders  of the defined types were placed in this encounter.    *If you need refills on other medications prior to your next appointment, please contact your pharmacy*  Follow-Up: Call back or seek an in-person evaluation if the symptoms worsen or if the condition fails to improve as anticipated.  [facility] [phone]  Other Instructions Please keep skin clean and dry. Since no longer breastfeeding, ok to use OTC Tylenol  and Ibuprofen as needed. Apply compresses as discussed. Take antibiotic as directed. If you note any non-resolving, new, or worsening symptoms despite treatment, please seek an in-person evaluation ASAP.   Mastitis  Mastitis is irritation and swelling (inflammation) in an area of the breast. This most often happens in females who are breastfeeding, but it can happen to anyone. This includes males. It will sometimes go away on its own. Other times, mastitis may need treatment. What are the causes? In people who are not breastfeeding, mastitis is often caused by germs (bacteria) that get into breast tissue. This can happen through cuts, cracks, or openings in the skin. Other causes include: Nipple piercing. Some types of breast cancer. What are the signs or symptoms? Swelling, redness, tenderness, and pain in the breast. The area may also feel warm. Swelling of the glands under the arm. Fluid coming from the nipple. Feeling tired (fatigue). Headache and body aches. Fever and chills. Vomiting or feeling like you may vomit (nauseous). Symptoms  can last 2-5 days. The pain and redness are the worst on days 2 and 3. This will often go away by day 5. If an infection develops and is not treated, pus or fluid may form under the skin (abscess). How is this diagnosed? Mastitis is often diagnosed based on a physical exam and your symptoms. You may also have other tests, such as: Blood tests. Fluid tests. If fluid collects under the skin, it may be tested to find if  germs are present. Breast X-rays or ultrasounds. How is this treated? Treatment may include: Using hot or cold compresses. Pain medicine. Antibiotic or steroid medicine. Rest. Drinking plenty of fluids. Removing pus or fluid from under the skin. Follow these instructions at home: Breast care  Keep your nipples clean and dry. If told, put ice on the affected area. Put ice in a plastic bag. Place a towel between your skin and the bag. Leave the ice on for 20 minutes, 2-3 times a day. If told, put heat on the affected area. Do this as often as told by your doctor. Use the heat source that your doctor recommends, such as a moist heat pack or heating pad. Place a towel between your skin and the heat source. Leave the heat on for 20-30 minutes. If your skin turns bright red, take off the ice or heat right away to prevent skin damage. The risk of damage is higher if you cannot feel pain, heat, or cold. Medicines Take over-the-counter and prescription medicines only as told by your doctor. If you were prescribed antibiotics, take them as told by your doctor. Do not stop using them even if you start to feel better. Contact a doctor if: You have pus-like fluid leaking from the breast. You have a fever. Your pain and swelling get worse. Your symptoms do not start to get better within 2 days of starting treatment. Your pain is not helped by medicine. Get help right away if: You have a red line going from your breast toward your armpit. This information is not intended to replace advice given to you by your health care provider. Make sure you discuss any questions you have with your health care provider. Document Revised: 03/23/2022 Document Reviewed: 03/23/2022 Elsevier Patient Education  2024 Elsevier Inc[employer]   If you have been instructed to have an in-person evaluation today at a local Urgent Care facility, please use the link below. It will take you to a list of all of our available [facility], including address, phone number and hours of operation. Please do not delay care.  [facility]  If you or a family member do not have a primary care provider, use the link below to schedule a visit and establish care. When you choose a [facility] primary care physician or advanced practice provider, you gain a long-term partner in health. Find a Primary Care Provider  Learn more about [facility] in-office and virtual care options: [facility] - Get Care Now

## 2024-04-04 ENCOUNTER — Telehealth: Payer: Self-pay | Admitting: Family Medicine

## 2024-04-04 ENCOUNTER — Encounter: Payer: Self-pay | Admitting: Family Medicine

## 2024-04-04 NOTE — Telephone Encounter (Signed)
 Patient stated her FMLA papers were not completed. She said there was a page left blank. She needs it filled out and re faxed.

## 2024-04-05 NOTE — Lactation Note (Signed)
 This note was copied from a baby's chart.  NICU Lactation Consultation Note  Patient Name: Pamela Booker Unijb'd Date: 04/05/2024 Age:30 days  Reason for consult: Follow-up assessment; Mother's request; RN request; Late-preterm 34-36.6wks; Other (Comment); NICU baby (gHTN) Type of Endocrine Disorder?: Thyroid  (thyroid  lobectomy)  SUBJECTIVE Visited with family of 35 18/85 weeks old AGA NICU female Pamela Booker; Pamela Booker call for assistance because she needed some education on lactation suppression. She voiced her breast are still painful but not as much as yesterday, since she started taking the antibiotics, the pain, swelling and redness have improved. She has decided to dry out her milk and wanted some tips to make the process bearable.   Discussed using cabbage leaves, ice packs, ibuprofen and a hand pump to pump just comfort. She was doing hand expression instead, advised to try the hand pump to keep stimulation at the breast minimal. She was told to use antihistaminics as well, let her know that they won't hurt her and may contribute to decrease supply, but that it will still take about 2 weeks for her breasts to go back to normal. FOB present and supportive. All questions and concerns answered, family to contact [facility] services PRN.  OBJECTIVE Infant data: Mother's Current Feeding Choice: Breast Milk and Donor Milk  O2 Device: Room Air  Infant feeding assessment IDFTS - Readiness: 4   Maternal data: [id number] Vaginal, Spontaneous No data recorded Pump: Hands Free, Personal (Lansinoh wearable)  ASSESSMENT Infant: Feeding Status: Scheduled 9-12-3-6 Feeding method: Tube/Gavage (Bolus)  Maternal: Not pumping  INTERVENTIONS/PLAN Interventions: Discharge Education: Engorgement and breast care  Plan: Consult Status: PRN NICU Follow-up type: Weekly NICU follow up   Sharon Stapel S Shyana Kulakowski 04/05/2024, 9:22 AM

## 2024-04-08 ENCOUNTER — Other Ambulatory Visit: Payer: Self-pay

## 2024-04-08 ENCOUNTER — Ambulatory Visit

## 2024-04-08 VITALS — BP 120/77 | HR 69 | Wt 194.2 lb

## 2024-04-08 DIAGNOSIS — Z0130 Encounter for examination of blood pressure without abnormal findings: Secondary | ICD-10-CM

## 2024-04-08 NOTE — Progress Notes (Cosign Needed)
 Subjective:  Pamela Booker is a [id number] here for BP check.  She is 1 week postpartum following a normal spontaneous vaginal delivery.    Hypertension ROS: Patient denies headache, visual changes, and abdominal pain in the right upper quadrant   Objective:  BP 120/77   Pulse 69   Wt 194 lb 3 oz (88.1 kg)   LMP 07/27/2023   Breastfeeding Yes   BMI 35.52 kg/m   Appearance alert, well appearing, and in no distress.  Assessment:   Blood Pressure well controlled, stable, and asymptomatic.   Plan:  Current treatment plan is effective, no change in therapy.[name] Cyndee JAYSON Joshua, RN

## 2024-04-09 ENCOUNTER — Telehealth: Payer: Self-pay

## 2024-04-09 ENCOUNTER — Encounter: Payer: Self-pay | Admitting: Family Medicine

## 2024-04-09 ENCOUNTER — Encounter: Admitting: Family Medicine

## 2024-04-09 ENCOUNTER — Other Ambulatory Visit

## 2024-04-09 DIAGNOSIS — N809 Endometriosis, unspecified: Secondary | ICD-10-CM

## 2024-04-09 MED ORDER — TRAMADOL HCL 50 MG PO TABS: 50.0000 mg | ORAL_TABLET | Freq: Four times a day (QID) | ORAL | 0 refills | Status: DC | PRN

## 2024-04-09 NOTE — Telephone Encounter (Signed)
 Patient's case worker found her paperwork.  See call encounter.    Waddell, RN

## 2024-04-09 NOTE — Telephone Encounter (Signed)
 RN called pt to address concerns of her job not receiving the completed FMLA forms.  Pt stated that her caseworker found the paperwork and they have it on record.  She doesn't need paperwork assistance at this time.    Waddell, RN

## 2024-04-11 ENCOUNTER — Ambulatory Visit (HOSPITAL_COMMUNITY)

## 2024-04-21 NOTE — Lactation Note (Signed)
 This note was copied from a baby'Booker chart.  NICU Lactation Consultation Note  Patient Name: Pamela Booker Unijb'd Date: 04/21/2024 Age:30 wk.o.  Reason for consult: Follow-up assessment; NICU baby; Early term 57-38.6wks; Other (Comment) (gHTN) Type of Endocrine Disorder?: Thyroid  (Thyroid  lobectomy)  SUBJECTIVE LC in the room to visit with family of 67 6/57 weeks old AGA NICU female; baby Pamela Booker already has a discharge planning underway. NICU RN Pamela Booker reported to this [facility] that Pamela Booker is no longer pumping, she was able to dry out her milk soon after she saw lactation. Baby is on Similac 22 calorie formula. Lactation services are completed at this time.  OBJECTIVE Infant data: Mother'Booker Current Feeding Choice: Breast Milk and Donor Milk  O2 Device: Room Air  Infant feeding assessment IDFTS - Readiness: 1 IDFTS - Quality: 2   Maternal data: [id number] Vaginal, Spontaneous No data recorded Pump: Hands Free, Personal (Lansinoh wearable)  ASSESSMENT Infant: Feeding Status: Ad lib Feeding method: Bottle Nipple Type: Dr. Jonna Preemie  Maternal: Not pumping  INTERVENTIONS/PLAN Interventions: No data recorded Plan: Consult Status: Complete   Pamela Booker Booker Pamela Booker 04/21/2024, 9:55 AM

## 2024-04-24 ENCOUNTER — Other Ambulatory Visit: Payer: Self-pay

## 2024-04-27 ENCOUNTER — Encounter: Payer: Self-pay | Admitting: Family Medicine

## 2024-04-27 NOTE — Progress Notes (Deleted)
 04/27/2024 Pamela Booker [id number] [birth year]  Gastroenterology Office Note    Referring Provider: Synthia Raisin, CNM Primary Care Physician:  Patient, No Pcp Per  Primary GI Provider: Jinny Carmine, MD    Chief Complaint   No chief complaint on file.    History of Present Illness   Pamela Booker is a 28 y.o. 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     EAB 11/19.   Started back on continuous nuvaring thereafter  1/20- excessive bleeding. U/s with 3cm EMS. Placed on provera 10mg  bid in addition to nuvaring.  Encourage progesterone  taper. Have full menses at end of 3wks of Nuvaring.   2/20- u/s with resolved thickening of EMS to 4mm  Restart continuous nuvaring   EAB 11/19.   Sta   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/04/2022   Formatting of this note might be different from the original. Pap 3/22- LGSIL. Colpo 4/22- CIN 1 Pap 10/23- negative     Migraine headache 07/05/2021   Last Assessment & Plan:     As needed Fioricet  for now     Migraine without aura and responsive to treatment 12/11/2014   Migraines    Nausea & vomiting 11/23/2022   Ovarian  cyst    Palpitations    Partial epilepsy with impairment of consciousness (HCC) 06/06/2022   PVC (premature ventricular contraction) 03/11/2018   Seizures (HCC)    last seizure was 2 weeks ago (app.12-18-22)   Shortness of breath 04/25/2016   Status post placement of VNS (vagus nerve stimulation) device    Syncope and collapse 06/10/2019   TB lung, 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     Longstanding. Reports occasional tearing and discomfort with sex.   She would like hormones checked.  She states that her estrogen level was low several years ago after a multiyear course of Depo-Provera.  Labs with low estradiol , FSH, and LH. Normal prolactin & TSH.[name]  Discussed increasing her estrogen level by changing to a higher     Past Surgical History:  Procedure Laterality Date   ABLATION, ENDOMETRIOSIS, ROBOT ASSIST, LAPAROSCOPIC  06/2022   [facility]   CYSTOSCOPY W/ URETERAL STENT REMOVAL Left 01/09/2023   Procedure: CYSTOSCOPY WITH STENT REMOVAL;  Surgeon: Twylla Glendia BROCKS, MD;  Location: [facility];  Service: Urology;  Laterality: Left;   CYSTOSCOPY/URETEROSCOPY/HOLMIUM LASER/STENT PLACEMENT Left 01/02/2023   Procedure: CYSTOSCOPY/URETEROSCOPY/HOLMIUM LASER/STENT PLACEMENT;  Surgeon: Twylla Glendia  C, MD;  Location: [facility];  Service: Urology;  Laterality: Left;   CYSTOSCOPY/URETEROSCOPY/HOLMIUM LASER/STENT PLACEMENT Right 01/09/2023   Procedure: CYSTOSCOPY/URETEROSCOPY/HOLMIUM LASER/STENT PLACEMENT;  Surgeon: Twylla Glendia BROCKS, MD;  Location: [facility];  Service: Urology;  Laterality: Right;   DILATION AND EVACUATION N/A 05/17/2023   Procedure: DILATATION AND EVACUATION;  Surgeon: Izell Harari, MD;  Location: [facility];  Service: Gynecology;  Laterality: N/A;   LAPAROSCOPY     x5 for endometriosis   LOOP RECORDER INSERTION  2020   LOOP  RECORDER REMOVAL  2021   THYROIDECTOMY, PARTIAL     TONSILLECTOMY     URETEROSCOPY Left 01/09/2023   Procedure: URETEROSCOPY;  Surgeon: Twylla Glendia BROCKS, MD;  Location: [facility];  Service: Urology;  Laterality: Left;   VAGUS NERVE STIMULATOR INSERTION  2022    Current Outpatient Medications  Medication Sig Dispense Refill   acetaminophen  (TYLENOL ) 500 MG tablet Take 500 mg by mouth.     ACIDOPHILUS LACTOBACILLUS PO Take by mouth.     diazepam  (VALIUM ) 5 MG tablet Take 5 mg by mouth 2 (two) times daily.     escitalopram  (LEXAPRO ) 20 MG tablet Take 20 mg by mouth daily.     folic acid (FOLVITE) 1 MG tablet Take 1 mg by mouth.     furosemide  (LASIX ) 20 MG tablet Take 1 tablet (20 mg total) by mouth daily. (Patient not taking: Reported on 04/08/2024) 4 tablet 0   hydrOXYzine  (ATARAX ) 25 MG tablet Take 1 tablet (25 mg total) by mouth every 6 (six) hours as needed for itching. 30 tablet 2   NIFEdipine  (ADALAT  CC) 60 MG 24 hr tablet Take 1 tablet (60 mg total) by mouth daily. 30 tablet 1   OXcarbazepine  (TRILEPTAL ) 150 MG tablet Take 4 tablets (600 mg total) by mouth 2 (two) times daily.     pantoprazole  (PROTONIX ) 40 MG tablet Take 1 tablet (40 mg total) by mouth 2 (two) times daily. 60 tablet 3   Prenatal Vit-Fe Fumarate-FA (MULTIVITAMIN-PRENATAL) 27-0.8 MG TABS tablet Take 2 tablets by mouth daily at 12 noon.     pyridoxine  (B-6) 100 MG tablet Take 100 mg by mouth.     sucralfate  (CARAFATE ) 1 GM/10ML suspension Take 10 mLs (1 g total) by mouth daily. Take after evening meal 420 mL 1   sulfamethoxazole -trimethoprim  (BACTRIM  DS) 800-160 MG tablet Take 1 tablet by mouth 2 (two) times daily. 14 tablet 0   tiZANidine  (ZANAFLEX ) 4 MG tablet Take 2 mg by mouth.     traMADol  (ULTRAM ) 50 MG tablet Take 1 tablet (50 mg total) by mouth every 6 (six) hours as needed. 15 tablet 0   valACYclovir  (VALTREX ) 500 MG tablet Take 1 tablet (500 mg total) by mouth 2 (two) times daily. 60 tablet 6   No current  facility-administered medications for this visit.    Allergies as of 04/28/2024 - Review Complete 04/08/2024  Allergen Reaction Noted   Zolpidem Other (See Comments) 04/04/2022   Diphenhydramine  Other (See Comments) 12/27/2020   Ibuprofen Other (See Comments) 05/09/2019   Nsaids Other (See Comments) 12/02/2018   Compazine  [prochlorperazine ]  09/15/2021   Keppra  [levetiracetam ] Other (See Comments) 05/14/2022   Phenergan  [promethazine ]  10/08/2023   Emgality [galcanezumab-gnlm] Rash 07/04/2021   Lorazepam  Itching 12/08/2022    Family History  Adopted: Yes  Problem Relation Age of Onset   Healthy Daughter     Social History   Socioeconomic History   Marital status: Married    Spouse name: Not on file  Number of children: 1   Years of education: Not on file   Highest education level: Some college, no degree  Occupational History   Not on file  Tobacco Use   Smoking status: Never   Smokeless tobacco: Never  Vaping Use   Vaping status: Former   Substances: Nicotine  Substance and Sexual Activity   Alcohol use: Not Currently    Comment: socially   Drug use: Not Currently    Comment: THC   Sexual activity: Yes    Birth control/protection: None  Other Topics Concern   Not on file  Social History Narrative   Pt lives alone with daughter in 2 story home   Right handed   Pt drinks coffee everyday, sometime tea at night, soda-coke zero not often   exercise 3-4 times a week          Social Drivers of Corporate Investment Banker Strain: Not on file  Food Insecurity: No Food Insecurity (03/28/2024)   Hunger Vital Sign    Worried About Running Out of Food in the Last Year: Never true    Ran Out of Food in the Last Year: Never true  Transportation Needs: No Transportation Needs (03/28/2024)   PRAPARE - Administrator, Civil Service (Medical): No    Lack of Transportation (Non-Medical): No  Physical Activity: Not on file  Stress: Not on file  Social  Connections: Not on file  Intimate Partner Violence: Not At Risk (03/28/2024)   Humiliation, Afraid, Rape, and Kick questionnaire    Fear of Current or Ex-Partner: No    Emotionally Abused: No    Physically Abused: No    Sexually Abused: No     RELEVANT GI HISTORY, IMAGING AND LABS: CBC    Component Value Date/Time   WBC 7.5 03/29/2024 1542   RBC 3.52 (L) 03/29/2024 1542   HGB 10.1 (L) 03/29/2024 1542   HGB 11.1 03/12/2024 1607   HCT 31.6 (L) 03/29/2024 1542   HCT 33.3 (L) 03/12/2024 1607   PLT 240 03/29/2024 1542   PLT 309 03/12/2024 1607   MCV 89.8 03/29/2024 1542   MCV 94 03/12/2024 1607   MCH 28.7 03/29/2024 1542   MCHC 32.0 03/29/2024 1542   RDW 13.5 03/29/2024 1542   RDW 12.4 03/12/2024 1607   LYMPHSABS 2.4 10/08/2023 1656   MONOABS 0.5 06/18/2023 2003   EOSABS 0.1 10/08/2023 1656   BASOSABS 0.0 10/08/2023 1656   Recent Labs    05/21/23 1609 06/18/23 2003 09/04/23 1915 10/08/23 1656 10/09/23 1143 02/13/24 1035 03/12/24 1607 03/23/24 1815 03/27/24 2035 03/29/24 1542  HGB 12.0 14.3 15.5* 14.6 15.1* 11.5 11.1 11.5* 10.4* 10.1*    CMP     Component Value Date/Time   NA 135 03/29/2024 1542   NA 137 03/26/2024 1623   K 3.6 03/29/2024 1542   CL 105 03/29/2024 1542   CO2 22 03/29/2024 1542   GLUCOSE 117 (H) 03/29/2024 1542   BUN 6 03/29/2024 1542   BUN 6 03/26/2024 1623   CREATININE 0.72 03/29/2024 1542   CREATININE 0.53 10/23/2023 1354   CALCIUM  8.9 03/29/2024 1542   PROT 5.8 (L) 03/29/2024 1542   PROT 5.9 (L) 03/26/2024 1623   ALBUMIN 2.5 (L) 03/29/2024 1542   ALBUMIN 3.4 (L) 03/26/2024 1623   AST 37 03/29/2024 1542   ALT 31 03/29/2024 1542   ALKPHOS 225 (H) 03/29/2024 1542   BILITOT 0.4 03/29/2024 1542   BILITOT 0.2 03/26/2024 1623   GFRNONAA >60  03/29/2024 1542   GFRAA >60 03/11/2019 1300      Latest Ref Rng & Units 03/29/2024    3:42 PM 03/26/2024    4:23 PM 03/23/2024    6:15 PM  Hepatic Function  Total Protein 6.5 - 8.1 g/dL 5.8   5.9  6.4   Albumin 3.5 - 5.0 g/dL 2.5  3.4  2.7   AST 15 - 41 U/L 37  22  24   ALT 0 - 44 U/L 31  15  17    Alk Phosphatase 38 - 126 U/L 225  262  218   Total Bilirubin 0.0 - 1.2 mg/dL 0.4  0.2  0.2       Review of Systems   All systems reviewed and negative except where noted in HPI.    Physical Exam  LMP 07/27/2023  Patient's last menstrual period was 07/27/2023. General:   Alert and oriented. Pleasant and cooperative. Well-nourished and well-developed.  Head:  Normocephalic and atraumatic. Eyes:  Without icterus Ears:  Normal auditory acuity. Neck:  Supple; no masses or thyromegaly. Lungs:  Respirations even and unlabored.  Clear throughout to auscultation.   No wheezes, crackles, or rhonchi. No acute distress. Heart:  Regular rate and rhythm; no murmurs, clicks, rubs, or gallops. Abdomen:  Normal bowel sounds.  No bruits.  Soft, non-tender and non-distended without masses, hepatosplenomegaly or hernias noted.  No guarding or rebound tenderness.  ***Negative Carnett sign.   Rectal:  Deferred.***  Msk:  Symmetrical without gross deformities. Normal posture. Extremities:  Without edema. Neurologic:  Alert and  oriented x4;  grossly normal neurologically. Skin:  Intact without significant lesions or rashes. Psych:  Alert and cooperative. Normal mood and affect.   Assessment & Plan   Pamela Booker is a 28 y.o. female presenting today with    I discussed the assessment and treatment plan with the patient. The patient was provided an opportunity to ask questions and all were answered. The patient agreed with the plan and demonstrated an understanding of the instructions.   The patient was advised to call back or seek an in-person evaluation if the symptoms worsen or if the condition fails to improve as anticipated.  Grayce Bohr, DNP, AGNP-C [facility]

## 2024-04-28 ENCOUNTER — Ambulatory Visit: Admitting: Family Medicine

## 2024-05-05 ENCOUNTER — Encounter: Payer: Self-pay | Admitting: Family Medicine

## 2024-05-05 ENCOUNTER — Ambulatory Visit: Admitting: Family Medicine

## 2024-05-05 VITALS — BP 110/75 | HR 67 | Wt 186.0 lb

## 2024-05-05 DIAGNOSIS — O210 Mild hyperemesis gravidarum: Secondary | ICD-10-CM

## 2024-05-05 DIAGNOSIS — O26643 Intrahepatic cholestasis of pregnancy, third trimester: Secondary | ICD-10-CM

## 2024-05-05 DIAGNOSIS — N809 Endometriosis, unspecified: Secondary | ICD-10-CM | POA: Diagnosis not present

## 2024-05-05 DIAGNOSIS — O133 Gestational [pregnancy-induced] hypertension without significant proteinuria, third trimester: Secondary | ICD-10-CM

## 2024-05-05 DIAGNOSIS — Z9689 Presence of other specified functional implants: Secondary | ICD-10-CM | POA: Diagnosis not present

## 2024-05-05 MED ORDER — ETONOGESTREL-ETHINYL ESTRADIOL 0.12-0.015 MG/24HR VA RING: VAGINAL_RING | VAGINAL | 5 refills | Status: DC

## 2024-05-05 MED ORDER — PHENTERMINE HCL 37.5 MG PO CAPS: 37.5000 mg | ORAL_CAPSULE | ORAL | 1 refills | Status: AC

## 2024-05-05 NOTE — Progress Notes (Signed)
 Post Partum Visit Note  Pamela Booker is a 28 y.o. [phone] female who presents for a postpartum visit. She is 4 weeks postpartum following a normal spontaneous vaginal delivery.  I have fully reviewed the prenatal and intrapartum course. The delivery was at 35.2 gestational weeks.  Anesthesia: epidural. Postpartum course has been uncomplicated. Baby is doing well. Baby is feeding by bottle - Similac Neosure. Bleeding staining only. Bowel function is normal. Bladder function is normal. Patient is not sexually active. Contraception method is none. Postpartum depression screening: negative.  Seizures-- has vagal stimulators-- has had 1 sz in the last 2 weeks  The pregnancy intention screening data noted above was reviewed. Potential methods of contraception were discussed. The patient elected to proceed with No data recorded.    Health Maintenance Due  Topic Date Due   COVID-19 Vaccine (1) Never done   Hepatitis B Vaccines 19-59 Average Risk (1 of 3 - 19+ 3-dose series) Never done   HPV VACCINES (2 - 3-dose series) 10/19/2020    The following portions of the patient's history were reviewed and updated as appropriate: allergies, current medications, past family history, past medical history, past social history, past surgical history, and problem list.  Review of Systems Pertinent items are noted in HPI.  Objective:  LMP 07/27/2023    General:  alert, cooperative, and appears stated age   Breasts:  not indicated  Lungs: clear to auscultation bilaterally  Heart:  regular rate and rhythm, S1, S2 normal, no murmur, click, rub or gallop  Abdomen: soft, non-tender; bowel sounds normal; no masses,  no organomegaly   Wound NA  GU exam:  not indicated       Assessment:    1. Gestational hypertension, third trimester BP WNL  2. Cholestasis during pregnancy in third trimester Resolved  3. Hyperemesis gravidarum Resolved  4. Endometriosis determined by laparoscopy -- see  plan  5. S/P placement of VNS (vagus nerve stimulation) device  6. Postpartum exam   Normal postpartum exam.   Plan:   Essential components of care per ACOG recommendations:  1.  Mood and well being: Patient with negative depression screening today. Reviewed local resources for support.  - Patient tobacco use? No.   - hx of drug use? No.    2. Infant care and feeding:  -Patient currently breastmilk feeding? No.  -Social determinants of health (SDOH) reviewed in EPIC. No concerns  3. Sexuality, contraception and birth spacing - Patient does not want a pregnancy in the next year.  Desired family size is 2 children.  - Reviewed reproductive life planning. Reviewed contraceptive methods based on pt preferences and effectiveness.  Patient desired Vaginal Ring today.   - Discussed birth spacing of 18 months  4. Sleep and fatigue -Encouraged family/partner/community support of 4 hrs of uninterrupted sleep to help with mood and fatigue  5. Physical Recovery  - Discussed patients delivery and complications. She describes her labor as mixed. - Patient had a Vaginal, no problems at delivery. Patient had a no laceration. Perineal healing reviewed. Patient expressed understanding - Patient has urinary incontinence? No. - Patient is safe to resume physical and sexual activity  6.  Health Maintenance - HM due items addressed Yes - Last pap smear - at [facility], 10/2022 NIL. Pap smear not done at today's visit.  -Breast Cancer screening indicated? No.   7. Chronic Disease/Pregnancy Condition follow up: Endometriosis   Endometriosis determined by laparoscopy Extensive disease, stage 3 at last laparoscopy in 2024  Has gotten 1+ years of improved symptoms after laparoscopic procedures She has had 5 surgeries for this in the past.  Has been offered Lupron in the past but declined due to cost and side effects Previous treatments-- continuous Nuva Ring, Aygestin. Was on Orlissa but reports no  improvement in sx.  -  LSC for ablation of endometriosis and chromotubation done 05/09/19. Minimal endometriosis noted (stage 1). Tubes with bilateral fill and spill. - Underwent robotic excision of endometriosis and cystoscopy with hydrodistention 06/07/2022. More deeply infiltrating endometriosis noted, as well as on right ovary (stage 3)  Wanted to try continuous NuvaRing and this was prescribed Internal referral to [facility] due to extensive surgical history and potential future need for hysterectomy  - etonogestrel -ethinyl estradiol  (NUVARING) 0.12-0.015 MG/24HR vaginal ring; Insert vaginally and leave in place for 3 weeks consecutively and then place a new ring. Patient will be using ring continuously without breakthrough menses.  Dispense: 3 each; Refill: 5   Future Appointments  Date Time Provider Department Center  06/17/2024  4:15 PM Jeralyn Crutch, MD [facility]  06/18/2024  2:00 PM Celestia Rima, NP [facility] None     Suzen Maryan Masters, MD [facility], [facility]

## 2024-05-19 ENCOUNTER — Encounter: Payer: Self-pay | Admitting: Family Medicine

## 2024-05-19 ENCOUNTER — Other Ambulatory Visit: Payer: Self-pay | Admitting: Family Medicine

## 2024-05-19 DIAGNOSIS — N809 Endometriosis, unspecified: Secondary | ICD-10-CM

## 2024-05-20 ENCOUNTER — Encounter: Payer: Self-pay | Admitting: Family Medicine

## 2024-05-20 MED ORDER — TRAMADOL HCL 50 MG PO TABS: 50.0000 mg | ORAL_TABLET | Freq: Four times a day (QID) | ORAL | 0 refills | Status: AC | PRN

## 2024-06-17 ENCOUNTER — Ambulatory Visit: Admitting: Obstetrics and Gynecology

## 2024-06-17 ENCOUNTER — Encounter: Payer: Self-pay | Admitting: Obstetrics and Gynecology

## 2024-06-17 ENCOUNTER — Other Ambulatory Visit: Payer: Self-pay

## 2024-06-17 VITALS — BP 138/88 | HR 67 | Wt 187.6 lb

## 2024-06-17 DIAGNOSIS — G8929 Other chronic pain: Secondary | ICD-10-CM | POA: Diagnosis not present

## 2024-06-17 DIAGNOSIS — I493 Ventricular premature depolarization: Secondary | ICD-10-CM | POA: Diagnosis not present

## 2024-06-17 DIAGNOSIS — R102 Pelvic and perineal pain unspecified side: Secondary | ICD-10-CM

## 2024-06-17 DIAGNOSIS — N809 Endometriosis, unspecified: Secondary | ICD-10-CM

## 2024-06-17 DIAGNOSIS — N939 Abnormal uterine and vaginal bleeding, unspecified: Secondary | ICD-10-CM | POA: Diagnosis not present

## 2024-06-17 DIAGNOSIS — Z7689 Persons encountering health services in other specified circumstances: Secondary | ICD-10-CM | POA: Diagnosis not present

## 2024-06-17 DIAGNOSIS — Z789 Other specified health status: Secondary | ICD-10-CM

## 2024-06-17 NOTE — Progress Notes (Addendum)
 "   GYNECOLOGY VISIT  Patient name: Pamela Booker MRN [id number]  Date of birth: [birth year] Chief Complaint:   Gynecologic Exam  History:    Discussed the use of AI scribe software for clinical note transcription with the patient, who gave verbal consent to proceed.  History of Present Illness Pamela Booker is a 29 year old female with endometriosis who presents with ongoing pelvic pain and management of endometriosis. She is accompanied by her husband.  She was diagnosed with endometriosis about ten years ago after the birth of her daughter and has had five laparoscopic excision surgeries, most recently two years ago with excision from the cul-de-sac.  She has tried Lupron, Aygestin, and Orilissa with limited benefit. Lupron caused severe nausea and vomiting, and Aygestin led to breakthrough bleeding after six months. She is currently using the NuvaRing, which has helped in the past, but she recently had a heavy period after mis-timing its use.  During and after her recent pregnancy she had severe flare-ups with stabbing pelvic and rectal pain that occur randomly and make sitting difficult. She continues to have intermittent stabby butt pain.  Off hormones, her cycles are regular, last about three days, and are heaviest on the first two days. She has significant ovary pain with ovulation, longstanding dysmenorrhea, dyspareunia, and pain with bowel movements, and also has pain outside of menses.  She has epilepsy treated with Trileptal , which also improves PMDD symptoms. A thyroid  nodule was removed in pregnancy and was benign.  Every six to eight months she has severe GI episodes with vomiting, diarrhea, and mucus in stools. Baseline, she has bowel movements about three times per week with pebble-like stools and marked pain with defecation.  She is adopted but knows her birth mother had endometriosis and a hysterectomy. She does not desire future pregnancies and wants  to avoid them.  From [facility]MA report/history: 5 prior laparoscopies and wondering if she would do another on or have a hysterectomy, stage III endometriosis. Does not want a hysterectomy and worried about hormones and her epilepsy. Diagnosed with stage 3, two years ago. Presecribed tramadol  and that doesn't help either. Started nuvaring at 6 week PP and miscalculated on week 3 and went to week 4 and now period is heavy and dark and changing twice an hour. Started bleeding on Saturday. Periods typically 3 days long and last was 1.5 weeks. Feels like pain is moving up in abdominal muscles. Has only had US  during pregnancy   The following portions of the patient's history were reviewed and updated as appropriate: allergies, current medications, past family history, past medical history, past social history, past surgical history and problem list.   Health Maintenance:   Last pap No results found for: DIAGPAP, HPVHIGH, ADEQPAP  Health Maintenance  Topic Date Due   COVID-19 Vaccine (1) Never done   Hepatitis B Vaccine (1 of 3 - 19+ 3-dose series) Never done   HPV Vaccine (2 - 3-dose series) 10/19/2020   Pap with HPV screening  10/15/2025   DTaP/Tdap/Td vaccine (3 - Td or Tdap) 02/26/2034   Flu Shot  Completed   Hepatitis C Screening  Completed   HIV Screening  Completed   Pneumococcal Vaccine  Aged Out   Meningitis B Vaccine  Aged Out      Review of Systems:  Pertinent items are noted in HPI. Comprehensive review of systems was otherwise negative.   Objective:  Physical Exam BP 138/88   Pulse 67   Wt 187  lb 9.6 oz (85.1 kg)   Breastfeeding No   BMI 34.31 kg/m    Physical Exam Vitals and nursing note reviewed.  Constitutional:      Appearance: Normal appearance.  HENT:     Head: Normocephalic and atraumatic.  Pulmonary:     Effort: Pulmonary effort is normal.  Skin:    General: Skin is warm and dry.  Neurological:     General: No focal deficit present.     Mental Status:  She is alert.  Psychiatric:        Mood and Affect: Mood normal.        Behavior: Behavior normal.        Thought Content: Thought content normal.        Judgment: Judgment normal.       Assessment & Plan:   Assessment & Plan Endometriosis and Chronic Pelvic Pain Chronic endometriosis with severe pelvic pain, dyspareunia, and gastrointestinal symptoms. Previous treatments poorly tolerated. Current NuvaRing management suboptimal. Concerns about potential inflammatory bowel disease or connective tissue disorder. Discussed hysterectomy with concerns about hormonal changes affecting epilepsy and PMDD. - Ordered pelvic ultrasound to assess current status of endometriosis. - Continue NuvaRing with adjusted schedule to every three weeks to minimize bleeding. - Referred to gastroenterologist for evaluation of gastrointestinal symptoms. - Referred to internal medicine for further evaluation of potential autoimmune or inflammatory conditions. - Will discuss potential for hysterectomy with preservation of ovaries, considering risks and benefits.  Abnormal Uterine Bleeding Breakthrough bleeding due to NuvaRing schedule miscalculation. Previous correct use controlled bleeding. - Adjust NuvaRing schedule to every three weeks to minimize breakthrough bleeding.  Premenstrual Dysphoric Disorder PMDD managed with Trileptal , which also controls seizures. Concerns about hormonal fluctuations affecting PMDD symptoms. - Continue Trileptal  for PMDD and seizure management.  Contraceptive Management Current NuvaRing use for contraception and endometriosis symptom management. Concerns about hormonal side effects and epilepsy medication interaction.    Carter Quarry, MD [facility], [facility] "

## 2024-06-18 ENCOUNTER — Ambulatory Visit: Admitting: Family Medicine

## 2024-06-24 ENCOUNTER — Ambulatory Visit (HOSPITAL_COMMUNITY)

## 2024-06-27 ENCOUNTER — Ambulatory Visit

## 2024-06-28 ENCOUNTER — Telehealth: Admitting: Family

## 2024-06-28 DIAGNOSIS — U071 COVID-19: Secondary | ICD-10-CM | POA: Diagnosis not present

## 2024-06-28 MED ORDER — NIRMATRELVIR/RITONAVIR (PAXLOVID)TABLET: 3.0000 | ORAL_TABLET | Freq: Two times a day (BID) | ORAL | 0 refills | Status: AC

## 2024-06-28 MED ORDER — ONDANSETRON 4 MG PO TBDP: 4.0000 mg | ORAL_TABLET | Freq: Three times a day (TID) | ORAL | 0 refills | Status: AC | PRN

## 2024-06-28 MED ORDER — GUAIFENESIN-DM 100-10 MG/5ML PO SYRP: 5.0000 mL | ORAL_SOLUTION | ORAL | 0 refills | Status: AC | PRN

## 2024-06-28 MED ORDER — BENZONATATE 200 MG PO CAPS: 200.0000 mg | ORAL_CAPSULE | Freq: Two times a day (BID) | ORAL | 0 refills | Status: AC | PRN

## 2024-06-28 NOTE — Progress Notes (Signed)
 " Virtual Visit Consent   Pamela Booker, you are scheduled for a virtual visit with a [facility] provider today. Just as with appointments in the office, your consent must be obtained to participate. Your consent will be active for this visit and any virtual visit you may have with one of our providers in the next 365 days. If you have a MyChart account, a copy of this consent can be sent to you electronically.  As this is a virtual visit, video technology does not allow for your provider to perform a traditional examination. This may limit your provider's ability to fully assess your condition. If your provider identifies any concerns that need to be evaluated in person or the need to arrange testing (such as labs, EKG, etc.), we will make arrangements to do so. Although advances in technology are sophisticated, we cannot ensure that it will always work on either your end or our end. If the connection with a video visit is poor, the visit may have to be switched to a telephone visit. With either a video or telephone visit, we are not always able to ensure that we have a secure connection.  By engaging in this virtual visit, you consent to the provision of healthcare and authorize for your insurance to be billed (if applicable) for the services provided during this visit. Depending on your insurance coverage, you may receive a charge related to this service.  I need to obtain your verbal consent now. Are you willing to proceed with your visit today? Pamela Booker has provided verbal consent on 06/28/2024 for a virtual visit (video or telephone). Bari Learn, FNP  Date: 06/28/2024 12:17 PM   Virtual Visit via Video Note   I, Bari Learn, connected with  Pamela Booker  ([id number], [birth year]) on 06/28/24 at 12:15 PM EST by a video-enabled telemedicine application and verified that I am speaking with the correct person using two identifiers.  Location: Patient: Virtual  Visit Location Patient: Home Provider: Virtual Visit Location Provider: Home Office   I discussed the limitations of evaluation and management by telemedicine and the availability of in person appointments. The patient expressed understanding and agreed to proceed.    History of Present Illness: Pamela Booker is a 29 y.o. who identifies as a female who was assigned female at birth, and is being seen today for COVID. Reports her symptoms started 3 days ago and tested positive yesterday. Has a 58 week old at home.   HPI: Cough This is a new problem. The current episode started in the past 7 days. The problem has been gradually worsening. The problem occurs every few minutes. The cough is Non-productive. Associated symptoms include ear congestion, ear pain, headaches, myalgias, nasal congestion, postnasal drip and a sore throat. Pertinent negatives include no chills, fever, rhinorrhea, shortness of breath or wheezing. Risk factors for lung disease include smoking/tobacco exposure. She has tried rest and OTC cough suppressant for the symptoms. The treatment provided mild relief.    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  Resting comfortably  at home.  Head is normocephalic, atraumatic.  No labored breathing.  Speech is clear and coherent with logical  content.  Patient is alert and oriented at baseline.  Dry nonproductive cough  Assessment and Plan: 1. COVID-19 (Primary) - nirmatrelvir /ritonavir  (PAXLOVID ) 20 x 150 MG & 10 x 100MG  TABS; Take 3 tablets by mouth 2 (two) times daily for 5 days. (Take nirmatrelvir  150 mg two tablets twice daily for 5 days and ritonavir  100 mg one tablet twice daily for 5 days) Patient GFR is 60  Dispense: 30 tablet; Refill: 0 - benzonatate  (TESSALON ) 200 MG capsule; Take 1 capsule (200 mg total) by mouth 2 (two) times daily as needed for cough.  Dispense: 20 capsule; Refill: 0 - ondansetron  (ZOFRAN -ODT) 4 MG disintegrating tablet; Take 1 tablet (4 mg total) by mouth every 8 (eight) hours as needed for nausea or vomiting.  Dispense: 20 tablet; Refill: 0 - guaiFENesin -dextromethorphan (ROBITUSSIN DM) 100-10 MG/5ML syrup; Take 5 mLs by mouth every 4 (four) hours as needed for cough.  Dispense: 118 mL; Refill: 0  COVID positive, rest, force fluids, tylenol  as needed, report any worsening symptoms such as increased shortness of breath, swelling, or continued high fevers. Possible adverse effects discussed with antivirals.    Follow Up Instructions: I discussed the assessment and treatment plan with the patient. The patient was provided an opportunity to ask questions and all were answered. The patient agreed with the plan and demonstrated an understanding of the instructions.  A copy of instructions were sent to the patient via MyChart unless otherwise noted below.     The patient was advised to call back or seek an in-person evaluation if the symptoms worsen or if the condition fails to improve as anticipated.    Bari Learn, FNP     [1]  Allergies Allergen Reactions   Zolpidem Other (See Comments)    seizure  Other Reaction(s): Other (See Comments)    seizure   Diphenhydramine  Other (See Comments)    Crawling feeling under skin  Crawling feeling under skin    It feels as if I have things  crawling on my skin.  It feels as if I have things crawling on my skin.     Crawling feeling under skin    It feels as if I have things crawling on my skin.   Crawling feeling under skin    Crawling feeling under skin  It feels as if I have things crawling on my skin.   Ibuprofen Other (See Comments)    Other reaction(s): GI Upset (intolerance)  Other reaction(s): GI Upset (intolerance) Other reaction(s): GI Upset (intolerance)    Other reaction(s): GI Upset (intolerance)    I have stomach ulcers and I get really bad cramping.  Other reaction(s): GI Upset (intolerance)    I have stomach ulcers and I get really bad cramping.    Other reaction(s): GI Upset (intolerance) Other reaction(s): GI Upset (intolerance)    Other reaction(s): GI Upset (intolerance) Other reaction(s): GI Upset (intolerance)  Other reaction(s): GI Upset (intolerance)  I have stomach ulcers and I get really bad cramping.   Nsaids Other (See Comments)    History of bleeding ulcers   Compazine  [Prochlorperazine ]    Keppra  Demetris.dauphin ] Other (See Comments)    Makes her feel hungover    Phenergan  [Promethazine ]    Emgality [Galcanezumab-Gnlm] Rash   Lorazepam  Itching  [  2]  Current Outpatient Medications:    benzonatate  (TESSALON ) 200 MG capsule, Take 1 capsule (200 mg total) by mouth 2 (two) times daily as needed for cough., Disp: 20 capsule, Rfl: 0   guaiFENesin -dextromethorphan (ROBITUSSIN DM) 100-10 MG/5ML syrup, Take 5 mLs by mouth every 4 (four) hours as needed for cough., Disp: 118 mL, Rfl: 0   nirmatrelvir /ritonavir  (PAXLOVID ) 20 x 150 MG & 10 x 100MG  TABS, Take 3 tablets by mouth 2 (two) times daily for 5 days. (Take nirmatrelvir  150 mg two tablets twice daily for 5 days and ritonavir  100 mg one tablet twice daily for 5 days) Patient GFR is 60, Disp: 30 tablet, Rfl: 0   ondansetron  (ZOFRAN -ODT) 4 MG disintegrating tablet, Take 1 tablet (4 mg total) by mouth every 8 (eight) hours as  needed for nausea or vomiting., Disp: 20 tablet, Rfl: 0   diazepam  (VALIUM ) 5 MG tablet, Take 5 mg by mouth 2 (two) times daily., Disp: , Rfl:    escitalopram  (LEXAPRO ) 20 MG tablet, Take 20 mg by mouth daily., Disp: , Rfl:    etonogestrel -ethinyl estradiol  (NUVARING) 0.12-0.015 MG/24HR vaginal ring, Insert vaginally and leave in place for 3 weeks consecutively and then place a new ring. Patient will be using ring continuously without breakthrough menses., Disp: 3 each, Rfl: 5   hydrOXYzine  (ATARAX ) 25 MG tablet, Take 1 tablet (25 mg total) by mouth every 6 (six) hours as needed for itching., Disp: 30 tablet, Rfl: 2   NIFEdipine  (ADALAT  CC) 60 MG 24 hr tablet, Take 1 tablet (60 mg total) by mouth daily. (Patient not taking: Reported on 06/17/2024), Disp: 30 tablet, Rfl: 1   OXcarbazepine  (TRILEPTAL ) 150 MG tablet, Take 4 tablets (600 mg total) by mouth 2 (two) times daily., Disp: , Rfl:    pantoprazole  (PROTONIX ) 40 MG tablet, Take 1 tablet (40 mg total) by mouth 2 (two) times daily., Disp: 60 tablet, Rfl: 3   phentermine  37.5 MG capsule, Take 1 capsule (37.5 mg total) by mouth every morning., Disp: 30 capsule, Rfl: 1   traMADol  (ULTRAM ) 50 MG tablet, Take 1 tablet (50 mg total) by mouth every 6 (six) hours as needed., Disp: 15 tablet, Rfl: 0   valACYclovir  (VALTREX ) 500 MG tablet, Take 1 tablet (500 mg total) by mouth 2 (two) times daily., Disp: 60 tablet, Rfl: 6  "

## 2024-06-28 NOTE — Patient Instructions (Signed)
 COVID-19: What to Know COVID-19 is an infection caused by a virus called SARS-CoV-2. This type of virus is called a coronavirus. People with COVID-19 may: Have few to no symptoms. Have mild to moderate symptoms that affect their lungs and breathing. Get very sick. What are the causes?  COVID-19 is caused by a virus. This virus may be in the air as droplets or on surfaces. It can spread from an infected person when they cough, sneeze, speak, sing, or breathe. You may become infected if: You breathe in the infected droplets in the air. You touch an object that has the virus on it. What increases the risk? You are at risk of getting COVID-19 if you have been around someone with the infection. You may be more likely to get very sick if: You are 31 years old or older. You have certain medical conditions, such as: Heart disease. Diabetes. Long-term respiratory disease. Cancer. Pregnancy. You are immunocompromised. This means your body can't fight infections easily. You have a disability that makes it hard for you to move around, you have trouble moving, or you can't move at all. What are the signs or symptoms? People may have different symptoms from COVID-19. The symptoms can also be mild to very bad. They often show up in 5-6 days after being infected. But, they can take up to 14 days to appear. Common symptoms are: Cough. Feeling tired. New loss of taste or smell. Fever. Less common symptoms are: Sore throat. Headache. Body or muscle aches. Diarrhea. A skin rash or fingers or toes that are a different color than usual. Red or irritated eyes. Sometimes, COVID-19 does not cause symptoms. How is this diagnosed? COVID-19 can be diagnosed with tests done in the lab or at home. Fluid from your nose, mouth, or lungs will be used to check for the virus. How is this treated? Treatment for COVID-19 depends on how sick you are. Mild symptoms can be treated at home with rest, fluids, and  over-the-counter medicines. very bad symptoms may be treated in a hospital intensive care unit (ICU). If you have symptoms and are at risk of getting very sick, you may be given a medicine that fights viruses. This medicine is called an antiviral. How is this prevented? To protect yourself from COVID-19: Know your risk factors. Get vaccinated. If your body can't fight infections easily, talk to your provider about treatment to help prevent COVID-19. Stay at least about 3 feet (1 meter) away from other people. Wear mask that fits well when: You can't stay at a distance from people. You're in a place with not a lot of air flow. Try to be in open spaces with good air flow when you are in public. Wash your hands often or use an alcohol-based hand sanitizer. Cover your nose and mouth when you cough or sneeze. If you think you have COVID-19 or have been around someone who has it, stay home and away from other people as told by your provider or health officials. Where to find more information To learn more: Go to [employer] Click Health Topics. Type COVID-19 in the search box. Go to VisitDestination.com.br Click Health Topics. Then click All Topics. Type COVID-19 in the search box. Get help right away if: You have trouble breathing or get short of breath. You have pain or pressure in your chest. You're feeling confused. These symptoms may be an emergency. Get help right away. Call 911. Do not wait to see if the symptoms will go away.  Do not drive yourself to the hospital. This information is not intended to replace advice given to you by your health care provider. Make sure you discuss any questions you have with your health care provider. Document Revised: 02/22/2023 Document Reviewed: 02/14/2023 Elsevier Patient Education  2025 [employer].

## 2024-07-04 ENCOUNTER — Ambulatory Visit
Admission: RE | Admit: 2024-07-04 | Discharge: 2024-07-04 | Disposition: A | Source: Ambulatory Visit | Attending: Obstetrics and Gynecology

## 2024-07-04 DIAGNOSIS — R102 Pelvic and perineal pain unspecified side: Secondary | ICD-10-CM | POA: Diagnosis present

## 2024-07-04 DIAGNOSIS — G8929 Other chronic pain: Secondary | ICD-10-CM | POA: Insufficient documentation

## 2024-07-04 DIAGNOSIS — N939 Abnormal uterine and vaginal bleeding, unspecified: Secondary | ICD-10-CM | POA: Diagnosis present

## 2024-07-04 DIAGNOSIS — N809 Endometriosis, unspecified: Secondary | ICD-10-CM | POA: Insufficient documentation

## 2024-07-09 ENCOUNTER — Ambulatory Visit: Payer: Self-pay | Admitting: Obstetrics and Gynecology

## 2024-07-11 ENCOUNTER — Encounter: Payer: Self-pay | Admitting: Obstetrics and Gynecology

## 2024-07-11 DIAGNOSIS — N939 Abnormal uterine and vaginal bleeding, unspecified: Secondary | ICD-10-CM

## 2024-07-11 DIAGNOSIS — N809 Endometriosis, unspecified: Secondary | ICD-10-CM

## 2024-07-11 DIAGNOSIS — R102 Pelvic and perineal pain unspecified side: Secondary | ICD-10-CM

## 2024-07-11 DIAGNOSIS — N946 Dysmenorrhea, unspecified: Secondary | ICD-10-CM

## 2024-07-11 MED ORDER — MYFEMBREE 40-1-0.5 MG PO TABS: 1.0000 | ORAL_TABLET | Freq: Every day | ORAL | 6 refills | Status: AC

## 2024-07-11 MED ORDER — CLONIDINE HCL 0.1 MG PO TABS: 0.1000 mg | ORAL_TABLET | Freq: Two times a day (BID) | ORAL | 0 refills | Status: AC | PRN

## 2024-07-11 MED ORDER — BACLOFEN 10 MG PO TABS: 10.0000 mg | ORAL_TABLET | Freq: Three times a day (TID) | ORAL | 0 refills | Status: AC

## 2024-07-16 ENCOUNTER — Ambulatory Visit: Admitting: Physician Assistant

## 2024-07-31 ENCOUNTER — Ambulatory Visit: Admitting: Gastroenterology

## 2024-08-12 ENCOUNTER — Ambulatory Visit: Payer: Self-pay | Admitting: Obstetrics and Gynecology
